# Patient Record
Sex: Female | Born: 1955 | Race: White | Hispanic: No | Smoking: Former smoker
Health system: Southern US, Community
[De-identification: ages and names within clinical notes are randomized; demographics above are authoritative.]

## PROBLEM LIST (undated history)

## (undated) DIAGNOSIS — F32A Depression, unspecified: Secondary | ICD-10-CM

## (undated) DIAGNOSIS — R109 Unspecified abdominal pain: Secondary | ICD-10-CM

## (undated) DIAGNOSIS — G43909 Migraine, unspecified, not intractable, without status migrainosus: Secondary | ICD-10-CM

## (undated) DIAGNOSIS — Z8719 Personal history of other diseases of the digestive system: Secondary | ICD-10-CM

## (undated) DIAGNOSIS — Z860100 Personal history of colon polyps, unspecified: Secondary | ICD-10-CM

## (undated) DIAGNOSIS — F329 Major depressive disorder, single episode, unspecified: Secondary | ICD-10-CM

## (undated) DIAGNOSIS — M1990 Unspecified osteoarthritis, unspecified site: Secondary | ICD-10-CM

## (undated) DIAGNOSIS — J449 Chronic obstructive pulmonary disease, unspecified: Secondary | ICD-10-CM

## (undated) DIAGNOSIS — M797 Fibromyalgia: Secondary | ICD-10-CM

## (undated) DIAGNOSIS — K3184 Gastroparesis: Secondary | ICD-10-CM

## (undated) DIAGNOSIS — K219 Gastro-esophageal reflux disease without esophagitis: Secondary | ICD-10-CM

## (undated) DIAGNOSIS — G8929 Other chronic pain: Secondary | ICD-10-CM

## (undated) DIAGNOSIS — K589 Irritable bowel syndrome without diarrhea: Secondary | ICD-10-CM

## (undated) DIAGNOSIS — F429 Obsessive-compulsive disorder, unspecified: Secondary | ICD-10-CM

## (undated) DIAGNOSIS — G629 Polyneuropathy, unspecified: Secondary | ICD-10-CM

## (undated) DIAGNOSIS — E785 Hyperlipidemia, unspecified: Secondary | ICD-10-CM

## (undated) HISTORY — DX: Polyneuropathy, unspecified: G62.9

## (undated) HISTORY — PX: OOPHORECTOMY: SHX86

## (undated) HISTORY — DX: Major depressive disorder, single episode, unspecified: F32.9

## (undated) HISTORY — DX: Fibromyalgia: M79.7

## (undated) HISTORY — PX: TOTAL ABDOMINAL HYSTERECTOMY: SHX209

## (undated) HISTORY — PX: ELBOW SURGERY: SHX618

## (undated) HISTORY — DX: Unspecified abdominal pain: R10.9

## (undated) HISTORY — DX: Depression, unspecified: F32.A

## (undated) HISTORY — PX: COLONOSCOPY: SHX174

## (undated) HISTORY — DX: Personal history of colon polyps, unspecified: Z86.0100

## (undated) HISTORY — DX: Other chronic pain: G89.29

## (undated) HISTORY — DX: Migraine, unspecified, not intractable, without status migrainosus: G43.909

## (undated) HISTORY — DX: Gastroparesis: K31.84

## (undated) HISTORY — DX: Personal history of colonic polyps: Z86.010

## (undated) HISTORY — PX: TUBAL LIGATION: SHX77

## (undated) HISTORY — PX: OTHER SURGICAL HISTORY: SHX169

## (undated) HISTORY — PX: NECK SURGERY: SHX720

## (undated) HISTORY — PX: VESICOVAGINAL FISTULA CLOSURE W/ TAH: SUR271

## (undated) HISTORY — DX: Chronic obstructive pulmonary disease, unspecified: J44.9

## (undated) HISTORY — PX: SHOULDER SURGERY: SHX246

## (undated) HISTORY — DX: Irritable bowel syndrome, unspecified: K58.9

## (undated) HISTORY — PX: APPENDECTOMY: SHX54

## (undated) HISTORY — PX: ABDOMINAL HYSTERECTOMY: SHX81

## (undated) HISTORY — PX: CERVICAL DISC SURGERY: SHX588

## (undated) HISTORY — DX: Obsessive-compulsive disorder, unspecified: F42.9

## (undated) HISTORY — DX: Hyperlipidemia, unspecified: E78.5

## (undated) HISTORY — DX: Cerebral aneurysm, nonruptured: I67.1

## (undated) HISTORY — DX: Gastro-esophageal reflux disease without esophagitis: K21.9

---

## 1997-09-21 ENCOUNTER — Ambulatory Visit (HOSPITAL_BASED_OUTPATIENT_CLINIC_OR_DEPARTMENT_OTHER): Admission: RE | Admit: 1997-09-21 | Discharge: 1997-09-21 | Payer: Self-pay | Admitting: Orthopedic Surgery

## 1997-10-19 ENCOUNTER — Ambulatory Visit (HOSPITAL_BASED_OUTPATIENT_CLINIC_OR_DEPARTMENT_OTHER): Admission: RE | Admit: 1997-10-19 | Discharge: 1997-10-19 | Payer: Self-pay | Admitting: Orthopedic Surgery

## 1998-01-17 ENCOUNTER — Emergency Department (HOSPITAL_COMMUNITY): Admission: EM | Admit: 1998-01-17 | Discharge: 1998-01-17 | Payer: Self-pay | Admitting: Internal Medicine

## 1998-03-20 ENCOUNTER — Emergency Department (HOSPITAL_COMMUNITY): Admission: EM | Admit: 1998-03-20 | Discharge: 1998-03-20 | Payer: Self-pay | Admitting: Emergency Medicine

## 1998-07-29 ENCOUNTER — Emergency Department (HOSPITAL_COMMUNITY): Admission: EM | Admit: 1998-07-29 | Discharge: 1998-07-29 | Payer: Self-pay | Admitting: Emergency Medicine

## 1998-07-30 ENCOUNTER — Encounter: Payer: Self-pay | Admitting: Emergency Medicine

## 1998-12-25 ENCOUNTER — Emergency Department (HOSPITAL_COMMUNITY): Admission: EM | Admit: 1998-12-25 | Discharge: 1998-12-25 | Payer: Self-pay | Admitting: *Deleted

## 1999-05-07 ENCOUNTER — Ambulatory Visit (HOSPITAL_COMMUNITY): Admission: RE | Admit: 1999-05-07 | Discharge: 1999-05-07 | Payer: Self-pay | Admitting: Gastroenterology

## 1999-05-07 ENCOUNTER — Encounter: Payer: Self-pay | Admitting: Gastroenterology

## 1999-05-21 ENCOUNTER — Inpatient Hospital Stay (HOSPITAL_COMMUNITY): Admission: AD | Admit: 1999-05-21 | Discharge: 1999-05-26 | Payer: Self-pay | Admitting: Addiction Psychiatry

## 1999-09-17 ENCOUNTER — Encounter: Payer: Self-pay | Admitting: Gastroenterology

## 1999-09-17 ENCOUNTER — Ambulatory Visit (HOSPITAL_COMMUNITY): Admission: RE | Admit: 1999-09-17 | Discharge: 1999-09-17 | Payer: Self-pay | Admitting: Gastroenterology

## 1999-12-22 ENCOUNTER — Emergency Department (HOSPITAL_COMMUNITY): Admission: EM | Admit: 1999-12-22 | Discharge: 1999-12-23 | Payer: Self-pay | Admitting: Emergency Medicine

## 1999-12-23 ENCOUNTER — Encounter: Payer: Self-pay | Admitting: Emergency Medicine

## 2000-01-14 ENCOUNTER — Other Ambulatory Visit: Admission: RE | Admit: 2000-01-14 | Discharge: 2000-01-14 | Payer: Self-pay | Admitting: Gastroenterology

## 2000-01-14 ENCOUNTER — Encounter (INDEPENDENT_AMBULATORY_CARE_PROVIDER_SITE_OTHER): Payer: Self-pay

## 2000-05-27 ENCOUNTER — Encounter: Payer: Self-pay | Admitting: Gastroenterology

## 2000-05-27 ENCOUNTER — Ambulatory Visit (HOSPITAL_COMMUNITY): Admission: RE | Admit: 2000-05-27 | Discharge: 2000-05-27 | Payer: Self-pay | Admitting: Gastroenterology

## 2000-08-02 ENCOUNTER — Emergency Department (HOSPITAL_COMMUNITY): Admission: EM | Admit: 2000-08-02 | Discharge: 2000-08-02 | Payer: Self-pay | Admitting: Emergency Medicine

## 2000-09-11 ENCOUNTER — Emergency Department (HOSPITAL_COMMUNITY): Admission: EM | Admit: 2000-09-11 | Discharge: 2000-09-11 | Payer: Self-pay | Admitting: Emergency Medicine

## 2000-09-18 ENCOUNTER — Emergency Department (HOSPITAL_COMMUNITY): Admission: EM | Admit: 2000-09-18 | Discharge: 2000-09-18 | Payer: Self-pay | Admitting: Emergency Medicine

## 2000-10-02 ENCOUNTER — Ambulatory Visit (HOSPITAL_COMMUNITY): Admission: RE | Admit: 2000-10-02 | Discharge: 2000-10-02 | Payer: Self-pay | Admitting: Endocrinology

## 2000-10-20 ENCOUNTER — Ambulatory Visit (HOSPITAL_BASED_OUTPATIENT_CLINIC_OR_DEPARTMENT_OTHER): Admission: RE | Admit: 2000-10-20 | Discharge: 2000-10-20 | Payer: Self-pay | Admitting: Orthopedic Surgery

## 2000-10-27 ENCOUNTER — Encounter: Payer: Self-pay | Admitting: Emergency Medicine

## 2000-10-27 ENCOUNTER — Emergency Department (HOSPITAL_COMMUNITY): Admission: EM | Admit: 2000-10-27 | Discharge: 2000-10-28 | Payer: Self-pay | Admitting: Emergency Medicine

## 2001-04-24 ENCOUNTER — Emergency Department (HOSPITAL_COMMUNITY): Admission: EM | Admit: 2001-04-24 | Discharge: 2001-04-25 | Payer: Self-pay | Admitting: Emergency Medicine

## 2001-06-25 ENCOUNTER — Encounter: Payer: Self-pay | Admitting: Emergency Medicine

## 2001-06-26 ENCOUNTER — Inpatient Hospital Stay (HOSPITAL_COMMUNITY): Admission: EM | Admit: 2001-06-26 | Discharge: 2001-06-29 | Payer: Self-pay | Admitting: Emergency Medicine

## 2001-07-07 ENCOUNTER — Encounter: Payer: Self-pay | Admitting: Internal Medicine

## 2001-07-07 ENCOUNTER — Encounter: Admission: RE | Admit: 2001-07-07 | Discharge: 2001-07-07 | Payer: Self-pay | Admitting: Internal Medicine

## 2001-07-24 ENCOUNTER — Encounter: Payer: Self-pay | Admitting: Emergency Medicine

## 2001-07-24 ENCOUNTER — Emergency Department (HOSPITAL_COMMUNITY): Admission: EM | Admit: 2001-07-24 | Discharge: 2001-07-25 | Payer: Self-pay | Admitting: Emergency Medicine

## 2001-08-12 ENCOUNTER — Emergency Department (HOSPITAL_COMMUNITY): Admission: EM | Admit: 2001-08-12 | Discharge: 2001-08-12 | Payer: Self-pay | Admitting: Emergency Medicine

## 2001-09-28 ENCOUNTER — Ambulatory Visit: Admission: RE | Admit: 2001-09-28 | Discharge: 2001-09-28 | Payer: Self-pay | Admitting: Endocrinology

## 2001-10-14 ENCOUNTER — Emergency Department (HOSPITAL_COMMUNITY): Admission: EM | Admit: 2001-10-14 | Discharge: 2001-10-14 | Payer: Self-pay | Admitting: Emergency Medicine

## 2001-11-18 ENCOUNTER — Ambulatory Visit (HOSPITAL_COMMUNITY): Admission: RE | Admit: 2001-11-18 | Discharge: 2001-11-18 | Payer: Self-pay | Admitting: Psychiatry

## 2001-12-03 ENCOUNTER — Encounter: Payer: Self-pay | Admitting: Internal Medicine

## 2001-12-03 ENCOUNTER — Encounter (INDEPENDENT_AMBULATORY_CARE_PROVIDER_SITE_OTHER): Payer: Self-pay | Admitting: Specialist

## 2001-12-03 ENCOUNTER — Inpatient Hospital Stay (HOSPITAL_COMMUNITY): Admission: EM | Admit: 2001-12-03 | Discharge: 2001-12-08 | Payer: Self-pay | Admitting: Internal Medicine

## 2001-12-08 ENCOUNTER — Encounter: Payer: Self-pay | Admitting: Internal Medicine

## 2001-12-31 ENCOUNTER — Encounter: Admission: RE | Admit: 2001-12-31 | Discharge: 2001-12-31 | Payer: Self-pay | Admitting: Internal Medicine

## 2001-12-31 ENCOUNTER — Encounter: Payer: Self-pay | Admitting: Internal Medicine

## 2002-01-03 ENCOUNTER — Encounter: Admission: RE | Admit: 2002-01-03 | Discharge: 2002-01-03 | Payer: Self-pay | Admitting: *Deleted

## 2002-01-03 ENCOUNTER — Encounter: Payer: Self-pay | Admitting: *Deleted

## 2002-02-04 ENCOUNTER — Encounter: Payer: Self-pay | Admitting: Gastroenterology

## 2002-02-04 ENCOUNTER — Ambulatory Visit (HOSPITAL_COMMUNITY): Admission: RE | Admit: 2002-02-04 | Discharge: 2002-02-04 | Payer: Self-pay | Admitting: Gastroenterology

## 2002-07-26 ENCOUNTER — Emergency Department (HOSPITAL_COMMUNITY): Admission: EM | Admit: 2002-07-26 | Discharge: 2002-07-26 | Payer: Self-pay | Admitting: Emergency Medicine

## 2003-04-28 ENCOUNTER — Encounter: Admission: RE | Admit: 2003-04-28 | Discharge: 2003-04-28 | Payer: Self-pay | Admitting: Internal Medicine

## 2003-05-17 ENCOUNTER — Emergency Department (HOSPITAL_COMMUNITY): Admission: EM | Admit: 2003-05-17 | Discharge: 2003-05-18 | Payer: Self-pay | Admitting: Emergency Medicine

## 2003-08-08 ENCOUNTER — Encounter
Admission: RE | Admit: 2003-08-08 | Discharge: 2003-11-06 | Payer: Self-pay | Admitting: Physical Medicine and Rehabilitation

## 2003-09-04 ENCOUNTER — Ambulatory Visit (HOSPITAL_COMMUNITY): Admission: RE | Admit: 2003-09-04 | Discharge: 2003-09-04 | Payer: Self-pay | Admitting: Orthopedic Surgery

## 2003-10-07 ENCOUNTER — Ambulatory Visit (HOSPITAL_COMMUNITY): Admission: RE | Admit: 2003-10-07 | Discharge: 2003-10-07 | Payer: Self-pay | Admitting: Orthopedic Surgery

## 2003-11-05 ENCOUNTER — Emergency Department (HOSPITAL_COMMUNITY): Admission: EM | Admit: 2003-11-05 | Discharge: 2003-11-05 | Payer: Self-pay | Admitting: Emergency Medicine

## 2003-11-07 ENCOUNTER — Encounter
Admission: RE | Admit: 2003-11-07 | Discharge: 2004-02-05 | Payer: Self-pay | Admitting: Physical Medicine and Rehabilitation

## 2004-03-13 ENCOUNTER — Ambulatory Visit: Payer: Self-pay | Admitting: Internal Medicine

## 2004-04-05 ENCOUNTER — Emergency Department (HOSPITAL_COMMUNITY): Admission: EM | Admit: 2004-04-05 | Discharge: 2004-04-06 | Payer: Self-pay | Admitting: Emergency Medicine

## 2004-05-29 ENCOUNTER — Ambulatory Visit: Payer: Self-pay | Admitting: Internal Medicine

## 2004-06-02 ENCOUNTER — Emergency Department (HOSPITAL_COMMUNITY): Admission: EM | Admit: 2004-06-02 | Discharge: 2004-06-02 | Payer: Self-pay | Admitting: Emergency Medicine

## 2004-06-12 ENCOUNTER — Emergency Department (HOSPITAL_COMMUNITY): Admission: EM | Admit: 2004-06-12 | Discharge: 2004-06-12 | Payer: Self-pay | Admitting: Emergency Medicine

## 2004-06-15 ENCOUNTER — Ambulatory Visit: Payer: Self-pay | Admitting: Internal Medicine

## 2004-06-28 ENCOUNTER — Ambulatory Visit: Payer: Self-pay | Admitting: Internal Medicine

## 2004-07-24 ENCOUNTER — Ambulatory Visit: Payer: Self-pay | Admitting: Internal Medicine

## 2004-07-28 ENCOUNTER — Emergency Department (HOSPITAL_COMMUNITY): Admission: EM | Admit: 2004-07-28 | Discharge: 2004-07-28 | Payer: Self-pay | Admitting: Emergency Medicine

## 2004-07-31 ENCOUNTER — Ambulatory Visit: Payer: Self-pay | Admitting: Internal Medicine

## 2004-08-07 ENCOUNTER — Ambulatory Visit: Payer: Self-pay | Admitting: Gastroenterology

## 2004-09-14 ENCOUNTER — Ambulatory Visit: Payer: Self-pay | Admitting: Internal Medicine

## 2004-09-26 ENCOUNTER — Ambulatory Visit: Payer: Self-pay | Admitting: Internal Medicine

## 2004-10-18 ENCOUNTER — Ambulatory Visit: Payer: Self-pay | Admitting: Internal Medicine

## 2004-10-18 ENCOUNTER — Ambulatory Visit (HOSPITAL_COMMUNITY): Admission: RE | Admit: 2004-10-18 | Discharge: 2004-10-18 | Payer: Self-pay | Admitting: Internal Medicine

## 2004-11-02 ENCOUNTER — Ambulatory Visit: Payer: Self-pay | Admitting: Internal Medicine

## 2004-11-05 ENCOUNTER — Encounter: Admission: RE | Admit: 2004-11-05 | Discharge: 2004-11-14 | Payer: Self-pay | Admitting: Neurosurgery

## 2004-11-08 ENCOUNTER — Ambulatory Visit (HOSPITAL_COMMUNITY): Admission: RE | Admit: 2004-11-08 | Discharge: 2004-11-08 | Payer: Self-pay | Admitting: Neurosurgery

## 2004-11-26 ENCOUNTER — Encounter: Admission: RE | Admit: 2004-11-26 | Discharge: 2005-02-24 | Payer: Self-pay | Admitting: Anesthesiology

## 2004-11-30 ENCOUNTER — Inpatient Hospital Stay (HOSPITAL_COMMUNITY): Admission: RE | Admit: 2004-11-30 | Discharge: 2004-12-02 | Payer: Self-pay | Admitting: Neurosurgery

## 2004-12-22 ENCOUNTER — Emergency Department (HOSPITAL_COMMUNITY): Admission: EM | Admit: 2004-12-22 | Discharge: 2004-12-22 | Payer: Self-pay | Admitting: Emergency Medicine

## 2005-01-02 ENCOUNTER — Ambulatory Visit: Payer: Self-pay | Admitting: Internal Medicine

## 2005-01-08 ENCOUNTER — Ambulatory Visit: Payer: Self-pay | Admitting: Gastroenterology

## 2005-03-04 ENCOUNTER — Ambulatory Visit (HOSPITAL_COMMUNITY): Admission: RE | Admit: 2005-03-04 | Discharge: 2005-03-04 | Payer: Self-pay | Admitting: Neurosurgery

## 2005-04-02 ENCOUNTER — Ambulatory Visit: Payer: Self-pay | Admitting: Internal Medicine

## 2005-04-04 ENCOUNTER — Ambulatory Visit: Payer: Self-pay | Admitting: Gastroenterology

## 2005-04-20 ENCOUNTER — Ambulatory Visit: Payer: Self-pay | Admitting: Family Medicine

## 2005-04-20 ENCOUNTER — Ambulatory Visit (HOSPITAL_COMMUNITY): Admission: RE | Admit: 2005-04-20 | Discharge: 2005-04-20 | Payer: Self-pay | Admitting: Family Medicine

## 2005-05-01 ENCOUNTER — Ambulatory Visit: Payer: Self-pay | Admitting: Internal Medicine

## 2005-05-03 ENCOUNTER — Encounter: Payer: Self-pay | Admitting: Neurosurgery

## 2005-05-16 ENCOUNTER — Ambulatory Visit: Payer: Self-pay | Admitting: Gastroenterology

## 2005-05-20 ENCOUNTER — Ambulatory Visit: Payer: Self-pay | Admitting: Cardiology

## 2005-05-29 ENCOUNTER — Ambulatory Visit: Payer: Self-pay | Admitting: Internal Medicine

## 2005-06-19 ENCOUNTER — Ambulatory Visit (HOSPITAL_COMMUNITY): Admission: AD | Admit: 2005-06-19 | Discharge: 2005-06-20 | Payer: Self-pay | Admitting: Interventional Radiology

## 2005-06-27 ENCOUNTER — Ambulatory Visit: Payer: Self-pay | Admitting: Gastroenterology

## 2005-07-02 ENCOUNTER — Ambulatory Visit (HOSPITAL_COMMUNITY): Admission: RE | Admit: 2005-07-02 | Discharge: 2005-07-02 | Payer: Self-pay | Admitting: Gastroenterology

## 2005-07-03 ENCOUNTER — Encounter: Payer: Self-pay | Admitting: Interventional Radiology

## 2005-07-25 ENCOUNTER — Ambulatory Visit: Payer: Self-pay | Admitting: Gastroenterology

## 2005-07-29 ENCOUNTER — Encounter (INDEPENDENT_AMBULATORY_CARE_PROVIDER_SITE_OTHER): Payer: Self-pay | Admitting: Specialist

## 2005-07-29 ENCOUNTER — Ambulatory Visit: Payer: Self-pay | Admitting: Gastroenterology

## 2005-08-27 ENCOUNTER — Ambulatory Visit: Payer: Self-pay | Admitting: Gastroenterology

## 2005-09-05 ENCOUNTER — Ambulatory Visit (HOSPITAL_COMMUNITY): Admission: RE | Admit: 2005-09-05 | Discharge: 2005-09-05 | Payer: Self-pay | Admitting: Interventional Radiology

## 2005-09-07 ENCOUNTER — Emergency Department (HOSPITAL_COMMUNITY): Admission: EM | Admit: 2005-09-07 | Discharge: 2005-09-07 | Payer: Self-pay | Admitting: Emergency Medicine

## 2005-09-09 ENCOUNTER — Ambulatory Visit: Payer: Self-pay | Admitting: Internal Medicine

## 2005-09-21 ENCOUNTER — Ambulatory Visit (HOSPITAL_COMMUNITY): Admission: RE | Admit: 2005-09-21 | Discharge: 2005-09-21 | Payer: Self-pay | Admitting: Otolaryngology

## 2005-09-21 ENCOUNTER — Ambulatory Visit: Payer: Self-pay | Admitting: Family Medicine

## 2005-09-26 ENCOUNTER — Ambulatory Visit: Payer: Self-pay | Admitting: Internal Medicine

## 2005-10-03 ENCOUNTER — Ambulatory Visit (HOSPITAL_COMMUNITY): Admission: RE | Admit: 2005-10-03 | Discharge: 2005-10-04 | Payer: Self-pay | Admitting: Interventional Radiology

## 2005-10-18 ENCOUNTER — Encounter: Payer: Self-pay | Admitting: Interventional Radiology

## 2005-11-27 ENCOUNTER — Ambulatory Visit: Payer: Self-pay | Admitting: Gastroenterology

## 2006-01-01 ENCOUNTER — Ambulatory Visit (HOSPITAL_COMMUNITY): Admission: RE | Admit: 2006-01-01 | Discharge: 2006-01-01 | Payer: Self-pay | Admitting: Interventional Radiology

## 2006-01-28 ENCOUNTER — Ambulatory Visit: Payer: Self-pay | Admitting: Internal Medicine

## 2006-04-03 ENCOUNTER — Ambulatory Visit (HOSPITAL_COMMUNITY): Admission: RE | Admit: 2006-04-03 | Discharge: 2006-04-03 | Payer: Self-pay | Admitting: Interventional Radiology

## 2006-04-25 ENCOUNTER — Ambulatory Visit: Payer: Self-pay | Admitting: Internal Medicine

## 2006-05-30 ENCOUNTER — Ambulatory Visit: Payer: Self-pay | Admitting: Internal Medicine

## 2006-08-12 ENCOUNTER — Ambulatory Visit: Payer: Self-pay | Admitting: Internal Medicine

## 2006-08-12 LAB — CONVERTED CEMR LAB
ALT: 13 units/L (ref 0–40)
AST: 15 units/L (ref 0–37)
Albumin: 3.7 g/dL (ref 3.5–5.2)
Alkaline Phosphatase: 105 units/L (ref 39–117)
BUN: 7 mg/dL (ref 6–23)
Basophils Absolute: 0 10*3/uL (ref 0.0–0.1)
Basophils Relative: 0.5 % (ref 0.0–1.0)
Calcium: 9.4 mg/dL (ref 8.4–10.5)
Chloride: 114 meq/L — ABNORMAL HIGH (ref 96–112)
Cholesterol: 103 mg/dL (ref 0–200)
Crystals: NEGATIVE
GFR calc non Af Amer: 63 mL/min
HCT: 42.5 % (ref 36.0–46.0)
LDL Cholesterol: 38 mg/dL (ref 0–99)
MCHC: 34.1 g/dL (ref 30.0–36.0)
Monocytes Relative: 5.8 % (ref 3.0–11.0)
Neutrophils Relative %: 67.7 % (ref 43.0–77.0)
RBC: 4.52 M/uL (ref 3.87–5.11)
RDW: 14 % (ref 11.5–14.6)
Sodium: 144 meq/L (ref 135–145)
Specific Gravity, Urine: 1.025 (ref 1.000–1.03)
TSH: 0.94 microintl units/mL (ref 0.35–5.50)
Total CHOL/HDL Ratio: 2.5
VLDL: 24 mg/dL (ref 0–40)
pH: 6 (ref 5.0–8.0)

## 2006-08-17 ENCOUNTER — Encounter: Admission: RE | Admit: 2006-08-17 | Discharge: 2006-08-17 | Payer: Self-pay | Admitting: Internal Medicine

## 2006-08-28 ENCOUNTER — Ambulatory Visit: Payer: Self-pay | Admitting: Gastroenterology

## 2006-09-03 ENCOUNTER — Ambulatory Visit: Payer: Self-pay | Admitting: Gastroenterology

## 2006-09-15 ENCOUNTER — Ambulatory Visit: Payer: Self-pay | Admitting: Gastroenterology

## 2006-09-15 ENCOUNTER — Encounter (INDEPENDENT_AMBULATORY_CARE_PROVIDER_SITE_OTHER): Payer: Self-pay | Admitting: *Deleted

## 2006-09-15 LAB — HM COLONOSCOPY

## 2006-10-20 ENCOUNTER — Ambulatory Visit (HOSPITAL_COMMUNITY): Admission: RE | Admit: 2006-10-20 | Discharge: 2006-10-20 | Payer: Self-pay | Admitting: Interventional Radiology

## 2006-11-18 DIAGNOSIS — F32A Depression, unspecified: Secondary | ICD-10-CM | POA: Insufficient documentation

## 2006-11-18 DIAGNOSIS — F329 Major depressive disorder, single episode, unspecified: Secondary | ICD-10-CM

## 2007-01-15 ENCOUNTER — Ambulatory Visit (HOSPITAL_COMMUNITY): Admission: RE | Admit: 2007-01-15 | Discharge: 2007-01-15 | Payer: Self-pay | Admitting: Interventional Radiology

## 2007-02-09 ENCOUNTER — Ambulatory Visit: Payer: Self-pay | Admitting: Internal Medicine

## 2007-02-09 LAB — CONVERTED CEMR LAB
Basophils Relative: 0.7 % (ref 0.0–1.0)
Bilirubin Urine: NEGATIVE
CO2: 26 meq/L (ref 19–32)
Creatinine, Ser: 1.1 mg/dL (ref 0.4–1.2)
Crystals: NEGATIVE
Glucose, Bld: 108 mg/dL — ABNORMAL HIGH (ref 70–99)
Hemoglobin: 15.1 g/dL — ABNORMAL HIGH (ref 12.0–15.0)
Lymphocytes Relative: 20.5 % (ref 12.0–46.0)
Monocytes Absolute: 0.4 10*3/uL (ref 0.2–0.7)
Monocytes Relative: 4.3 % (ref 3.0–11.0)
Mucus, UA: NEGATIVE
Neutro Abs: 7.2 10*3/uL (ref 1.4–7.7)
Nitrite: NEGATIVE
Potassium: 3.8 meq/L (ref 3.5–5.1)
Sodium: 146 meq/L — ABNORMAL HIGH (ref 135–145)
TSH: 0.9 microintl units/mL (ref 0.35–5.50)
Total Bilirubin: 0.6 mg/dL (ref 0.3–1.2)
Total Protein: 7 g/dL (ref 6.0–8.3)
Urine Glucose: NEGATIVE mg/dL
Urobilinogen, UA: 0.2 (ref 0.0–1.0)

## 2007-05-21 ENCOUNTER — Ambulatory Visit: Payer: Self-pay | Admitting: Internal Medicine

## 2007-07-20 ENCOUNTER — Ambulatory Visit: Payer: Self-pay | Admitting: Internal Medicine

## 2007-07-20 DIAGNOSIS — F411 Generalized anxiety disorder: Secondary | ICD-10-CM | POA: Insufficient documentation

## 2007-07-20 DIAGNOSIS — G609 Hereditary and idiopathic neuropathy, unspecified: Secondary | ICD-10-CM | POA: Insufficient documentation

## 2007-07-20 DIAGNOSIS — K589 Irritable bowel syndrome without diarrhea: Secondary | ICD-10-CM | POA: Insufficient documentation

## 2007-07-20 DIAGNOSIS — J309 Allergic rhinitis, unspecified: Secondary | ICD-10-CM | POA: Insufficient documentation

## 2007-07-20 DIAGNOSIS — Z860100 Personal history of colon polyps, unspecified: Secondary | ICD-10-CM | POA: Insufficient documentation

## 2007-07-20 DIAGNOSIS — K219 Gastro-esophageal reflux disease without esophagitis: Secondary | ICD-10-CM | POA: Insufficient documentation

## 2007-07-20 DIAGNOSIS — R1084 Generalized abdominal pain: Secondary | ICD-10-CM | POA: Insufficient documentation

## 2007-07-20 DIAGNOSIS — E785 Hyperlipidemia, unspecified: Secondary | ICD-10-CM | POA: Insufficient documentation

## 2007-07-20 LAB — CONVERTED CEMR LAB
ALT: 10 units/L (ref 0–35)
Albumin: 3.6 g/dL (ref 3.5–5.2)
Alkaline Phosphatase: 63 units/L (ref 39–117)
BUN: 4 mg/dL — ABNORMAL LOW (ref 6–23)
Basophils Absolute: 0 10*3/uL (ref 0.0–0.1)
Basophils Relative: 0.3 % (ref 0.0–1.0)
Bilirubin Urine: NEGATIVE
CO2: 29 meq/L (ref 19–32)
Calcium: 8.6 mg/dL (ref 8.4–10.5)
Crystals: NEGATIVE
GFR calc Af Amer: 61 mL/min
GFR calc non Af Amer: 51 mL/min
H Pylori IgG: NEGATIVE
Lipase: 13 units/L (ref 11.0–59.0)
Lymphocytes Relative: 16.5 % (ref 12.0–46.0)
MCHC: 34.1 g/dL (ref 30.0–36.0)
Monocytes Relative: 8.2 % (ref 3.0–11.0)
Neutro Abs: 7.8 10*3/uL — ABNORMAL HIGH (ref 1.4–7.7)
Platelets: 164 10*3/uL (ref 150–400)
Potassium: 3.5 meq/L (ref 3.5–5.1)
Specific Gravity, Urine: 1.025 (ref 1.000–1.03)
Urine Glucose: NEGATIVE mg/dL
pH: 6 (ref 5.0–8.0)

## 2007-09-09 ENCOUNTER — Emergency Department (HOSPITAL_COMMUNITY): Admission: EM | Admit: 2007-09-09 | Discharge: 2007-09-09 | Payer: Self-pay | Admitting: Emergency Medicine

## 2007-09-21 ENCOUNTER — Telehealth (INDEPENDENT_AMBULATORY_CARE_PROVIDER_SITE_OTHER): Payer: Self-pay | Admitting: *Deleted

## 2007-10-28 ENCOUNTER — Encounter: Payer: Self-pay | Admitting: Internal Medicine

## 2007-11-26 ENCOUNTER — Telehealth: Payer: Self-pay | Admitting: Internal Medicine

## 2007-12-14 ENCOUNTER — Ambulatory Visit (HOSPITAL_COMMUNITY): Admission: RE | Admit: 2007-12-14 | Discharge: 2007-12-14 | Payer: Self-pay | Admitting: Interventional Radiology

## 2007-12-29 ENCOUNTER — Telehealth: Payer: Self-pay | Admitting: Internal Medicine

## 2007-12-30 ENCOUNTER — Ambulatory Visit: Payer: Self-pay | Admitting: Internal Medicine

## 2007-12-30 DIAGNOSIS — M549 Dorsalgia, unspecified: Secondary | ICD-10-CM | POA: Insufficient documentation

## 2007-12-30 DIAGNOSIS — M25559 Pain in unspecified hip: Secondary | ICD-10-CM | POA: Insufficient documentation

## 2007-12-31 LAB — CONVERTED CEMR LAB
Cholesterol: 203 mg/dL (ref 0–200)
HDL: 40.6 mg/dL (ref >39.0)
Triglycerides: 107 mg/dL (ref 0–149)
VLDL: 21 mg/dL (ref 0–40)

## 2008-01-04 ENCOUNTER — Telehealth (INDEPENDENT_AMBULATORY_CARE_PROVIDER_SITE_OTHER): Payer: Self-pay | Admitting: *Deleted

## 2008-01-30 ENCOUNTER — Encounter: Admission: RE | Admit: 2008-01-30 | Discharge: 2008-01-30 | Payer: Self-pay | Admitting: Orthopedic Surgery

## 2008-02-02 ENCOUNTER — Ambulatory Visit: Payer: Self-pay | Admitting: Internal Medicine

## 2008-02-02 LAB — CONVERTED CEMR LAB
Alkaline Phosphatase: 48 units/L (ref 39–117)
Bilirubin, Direct: 0.1 mg/dL (ref 0.0–0.3)
LDL Cholesterol: 40 mg/dL (ref 0–99)
Total CHOL/HDL Ratio: 2.5
Total Protein: 6 g/dL (ref 6.0–8.3)
VLDL: 15 mg/dL (ref 0–40)

## 2008-04-15 ENCOUNTER — Ambulatory Visit: Payer: Self-pay | Admitting: Internal Medicine

## 2008-05-11 ENCOUNTER — Ambulatory Visit: Payer: Self-pay | Admitting: Internal Medicine

## 2008-05-11 DIAGNOSIS — J019 Acute sinusitis, unspecified: Secondary | ICD-10-CM | POA: Insufficient documentation

## 2008-05-11 DIAGNOSIS — H109 Unspecified conjunctivitis: Secondary | ICD-10-CM | POA: Insufficient documentation

## 2008-06-29 ENCOUNTER — Ambulatory Visit: Payer: Self-pay | Admitting: Internal Medicine

## 2008-06-29 LAB — CONVERTED CEMR LAB
Alkaline Phosphatase: 61 units/L (ref 39–117)
Basophils Absolute: 0.1 10*3/uL (ref 0.0–0.1)
Bilirubin Urine: NEGATIVE
Bilirubin, Direct: 0.1 mg/dL (ref 0.0–0.3)
Calcium: 9.2 mg/dL (ref 8.4–10.5)
Eosinophils Absolute: 0.2 10*3/uL (ref 0.0–0.7)
GFR calc Af Amer: 75 mL/min
GFR calc non Af Amer: 62 mL/min
HCT: 41.2 % (ref 36.0–46.0)
HDL: 43.3 mg/dL (ref >39.0)
Hemoglobin: 14.1 g/dL (ref 12.0–15.0)
Ketones, ur: NEGATIVE mg/dL
Leukocytes, UA: NEGATIVE
MCHC: 34.2 g/dL (ref 30.0–36.0)
Monocytes Absolute: 0.5 10*3/uL (ref 0.1–1.0)
Neutro Abs: 6.1 10*3/uL (ref 1.4–7.7)
Nitrite: NEGATIVE
Platelets: 197 10*3/uL (ref 150–400)
Potassium: 3.7 meq/L (ref 3.5–5.1)
RDW: 12.7 % (ref 11.5–14.6)
Sodium: 146 meq/L — ABNORMAL HIGH (ref 135–145)
Total Bilirubin: 0.7 mg/dL (ref 0.3–1.2)
Total CHOL/HDL Ratio: 2.6
Total Protein, Urine: NEGATIVE mg/dL
Total Protein: 6.3 g/dL (ref 6.0–8.3)
Triglycerides: 87 mg/dL (ref 0–149)
VLDL: 17 mg/dL (ref 0–40)
pH: 5.5 (ref 5.0–8.0)

## 2008-07-04 ENCOUNTER — Telehealth (INDEPENDENT_AMBULATORY_CARE_PROVIDER_SITE_OTHER): Payer: Self-pay | Admitting: *Deleted

## 2008-07-06 ENCOUNTER — Telehealth (INDEPENDENT_AMBULATORY_CARE_PROVIDER_SITE_OTHER): Payer: Self-pay | Admitting: *Deleted

## 2008-07-08 ENCOUNTER — Telehealth: Payer: Self-pay | Admitting: Internal Medicine

## 2008-07-11 ENCOUNTER — Ambulatory Visit: Payer: Self-pay | Admitting: Internal Medicine

## 2008-07-11 DIAGNOSIS — R197 Diarrhea, unspecified: Secondary | ICD-10-CM | POA: Insufficient documentation

## 2008-07-11 DIAGNOSIS — R634 Abnormal weight loss: Secondary | ICD-10-CM | POA: Insufficient documentation

## 2008-07-11 DIAGNOSIS — R222 Localized swelling, mass and lump, trunk: Secondary | ICD-10-CM | POA: Insufficient documentation

## 2008-07-11 DIAGNOSIS — R109 Unspecified abdominal pain: Secondary | ICD-10-CM | POA: Insufficient documentation

## 2008-07-15 ENCOUNTER — Ambulatory Visit: Payer: Self-pay | Admitting: Cardiovascular Disease

## 2008-07-20 ENCOUNTER — Ambulatory Visit: Payer: Self-pay | Admitting: Internal Medicine

## 2008-07-20 DIAGNOSIS — IMO0001 Reserved for inherently not codable concepts without codable children: Secondary | ICD-10-CM | POA: Insufficient documentation

## 2008-07-27 ENCOUNTER — Telehealth: Payer: Self-pay | Admitting: Gastroenterology

## 2008-07-27 ENCOUNTER — Telehealth (INDEPENDENT_AMBULATORY_CARE_PROVIDER_SITE_OTHER): Payer: Self-pay | Admitting: *Deleted

## 2008-07-28 ENCOUNTER — Ambulatory Visit: Payer: Self-pay | Admitting: Internal Medicine

## 2008-08-16 ENCOUNTER — Ambulatory Visit: Payer: Self-pay | Admitting: Gastroenterology

## 2008-08-16 DIAGNOSIS — F3131 Bipolar disorder, current episode depressed, mild: Secondary | ICD-10-CM | POA: Insufficient documentation

## 2008-08-17 LAB — CONVERTED CEMR LAB
Ferritin: 25.5 ng/mL (ref 10.0–291.0)
Iron: 94 ug/dL (ref 42–145)
Saturation Ratios: 30.3 % (ref 20.0–50.0)
Sed Rate: 9 mm/hr (ref 0–22)
Transferrin: 221.6 mg/dL (ref 212.0–360.0)

## 2008-08-18 ENCOUNTER — Ambulatory Visit: Payer: Self-pay | Admitting: Internal Medicine

## 2008-08-18 ENCOUNTER — Ambulatory Visit: Payer: Self-pay | Admitting: Gastroenterology

## 2008-08-18 DIAGNOSIS — E538 Deficiency of other specified B group vitamins: Secondary | ICD-10-CM | POA: Insufficient documentation

## 2008-08-19 ENCOUNTER — Telehealth (INDEPENDENT_AMBULATORY_CARE_PROVIDER_SITE_OTHER): Payer: Self-pay | Admitting: *Deleted

## 2008-08-25 ENCOUNTER — Ambulatory Visit: Payer: Self-pay | Admitting: Gastroenterology

## 2008-09-01 ENCOUNTER — Ambulatory Visit: Payer: Self-pay | Admitting: Gastroenterology

## 2008-09-01 DIAGNOSIS — K902 Blind loop syndrome, not elsewhere classified: Secondary | ICD-10-CM | POA: Insufficient documentation

## 2008-09-08 ENCOUNTER — Ambulatory Visit (HOSPITAL_COMMUNITY): Admission: RE | Admit: 2008-09-08 | Discharge: 2008-09-08 | Payer: Self-pay | Admitting: Gastroenterology

## 2008-09-19 ENCOUNTER — Ambulatory Visit: Payer: Self-pay | Admitting: Gastroenterology

## 2008-09-19 ENCOUNTER — Telehealth: Payer: Self-pay | Admitting: Gastroenterology

## 2008-09-19 ENCOUNTER — Encounter: Payer: Self-pay | Admitting: Internal Medicine

## 2008-09-19 DIAGNOSIS — K2970 Gastritis, unspecified, without bleeding: Secondary | ICD-10-CM | POA: Insufficient documentation

## 2008-09-19 DIAGNOSIS — K2990 Gastroduodenitis, unspecified, without bleeding: Secondary | ICD-10-CM

## 2008-09-19 DIAGNOSIS — K3184 Gastroparesis: Secondary | ICD-10-CM | POA: Insufficient documentation

## 2008-09-21 ENCOUNTER — Ambulatory Visit: Payer: Self-pay | Admitting: Internal Medicine

## 2008-09-21 DIAGNOSIS — R0989 Other specified symptoms and signs involving the circulatory and respiratory systems: Secondary | ICD-10-CM | POA: Insufficient documentation

## 2008-09-21 DIAGNOSIS — J449 Chronic obstructive pulmonary disease, unspecified: Secondary | ICD-10-CM | POA: Insufficient documentation

## 2008-09-21 DIAGNOSIS — R0609 Other forms of dyspnea: Secondary | ICD-10-CM | POA: Insufficient documentation

## 2008-09-21 DIAGNOSIS — J984 Other disorders of lung: Secondary | ICD-10-CM | POA: Insufficient documentation

## 2008-09-26 ENCOUNTER — Encounter: Payer: Self-pay | Admitting: Gastroenterology

## 2008-09-26 ENCOUNTER — Encounter: Payer: Self-pay | Admitting: Internal Medicine

## 2008-10-03 ENCOUNTER — Encounter: Payer: Self-pay | Admitting: Internal Medicine

## 2008-10-03 ENCOUNTER — Ambulatory Visit: Payer: Self-pay

## 2008-10-04 ENCOUNTER — Encounter: Payer: Self-pay | Admitting: Internal Medicine

## 2008-10-04 ENCOUNTER — Ambulatory Visit: Payer: Self-pay | Admitting: Gastroenterology

## 2008-10-04 ENCOUNTER — Ambulatory Visit: Payer: Self-pay | Admitting: Internal Medicine

## 2008-10-07 ENCOUNTER — Telehealth: Payer: Self-pay | Admitting: Internal Medicine

## 2008-10-10 ENCOUNTER — Encounter: Payer: Self-pay | Admitting: Internal Medicine

## 2008-10-12 ENCOUNTER — Ambulatory Visit: Payer: Self-pay | Admitting: Internal Medicine

## 2008-10-12 LAB — CONVERTED CEMR LAB: BUN: 14 mg/dL (ref 6–23)

## 2008-10-18 ENCOUNTER — Ambulatory Visit: Payer: Self-pay | Admitting: Cardiology

## 2008-10-20 ENCOUNTER — Encounter: Payer: Self-pay | Admitting: Internal Medicine

## 2008-10-21 ENCOUNTER — Ambulatory Visit: Payer: Self-pay | Admitting: Internal Medicine

## 2008-10-21 ENCOUNTER — Inpatient Hospital Stay (HOSPITAL_COMMUNITY): Admission: EM | Admit: 2008-10-21 | Discharge: 2008-10-22 | Payer: Self-pay | Admitting: Emergency Medicine

## 2008-11-02 ENCOUNTER — Encounter: Payer: Self-pay | Admitting: Interventional Radiology

## 2008-11-03 ENCOUNTER — Ambulatory Visit: Payer: Self-pay | Admitting: Gastroenterology

## 2008-11-16 ENCOUNTER — Encounter: Payer: Self-pay | Admitting: Gastroenterology

## 2008-12-05 ENCOUNTER — Telehealth: Payer: Self-pay | Admitting: Gastroenterology

## 2008-12-06 ENCOUNTER — Telehealth: Payer: Self-pay | Admitting: Gastroenterology

## 2008-12-08 ENCOUNTER — Ambulatory Visit: Payer: Self-pay | Admitting: Gastroenterology

## 2009-01-02 ENCOUNTER — Telehealth: Payer: Self-pay | Admitting: Gastroenterology

## 2009-01-12 ENCOUNTER — Ambulatory Visit: Payer: Self-pay | Admitting: Gastroenterology

## 2009-01-17 ENCOUNTER — Telehealth (INDEPENDENT_AMBULATORY_CARE_PROVIDER_SITE_OTHER): Payer: Self-pay | Admitting: *Deleted

## 2009-01-20 ENCOUNTER — Encounter: Payer: Self-pay | Admitting: Internal Medicine

## 2009-01-30 ENCOUNTER — Ambulatory Visit: Payer: Self-pay | Admitting: Internal Medicine

## 2009-01-31 LAB — CONVERTED CEMR LAB
Specific Gravity, Urine: >=1.03 (ref 1.000–1.030)
Urine Glucose: NEGATIVE mg/dL
Urobilinogen, UA: 0.2 (ref 0.0–1.0)
pH: 5.5 (ref 5.0–8.0)

## 2009-02-09 ENCOUNTER — Ambulatory Visit: Payer: Self-pay | Admitting: Gastroenterology

## 2009-02-09 ENCOUNTER — Telehealth: Payer: Self-pay | Admitting: Internal Medicine

## 2009-02-14 ENCOUNTER — Ambulatory Visit: Payer: Self-pay | Admitting: Internal Medicine

## 2009-02-14 LAB — CONVERTED CEMR LAB
ALT: 15 units/L (ref 0–35)
BUN: 17 mg/dL (ref 6–23)
CO2: 29 meq/L (ref 19–32)
Chloride: 111 meq/L (ref 96–112)
Cholesterol: 122 mg/dL (ref 0–200)
Creatinine, Ser: 0.9 mg/dL (ref 0.4–1.2)
Eosinophils Absolute: 0.2 10*3/uL (ref 0.0–0.7)
Eosinophils Relative: 2.3 % (ref 0.0–5.0)
Glucose, Bld: 78 mg/dL (ref 70–99)
MCHC: 34.3 g/dL (ref 30.0–36.0)
MCV: 98.4 fL (ref 78.0–100.0)
Monocytes Absolute: 0.6 10*3/uL (ref 0.1–1.0)
Neutrophils Relative %: 72.8 % (ref 43.0–77.0)
Nitrite: NEGATIVE
Platelets: 228 10*3/uL (ref 150.0–400.0)
Total Protein: 6.2 g/dL (ref 6.0–8.3)
Triglycerides: 44 mg/dL (ref 0.0–149.0)
Urine Glucose: NEGATIVE mg/dL
Urobilinogen, UA: 0.2 (ref 0.0–1.0)
WBC: 9.7 10*3/uL (ref 4.5–10.5)

## 2009-02-16 ENCOUNTER — Ambulatory Visit: Payer: Self-pay | Admitting: Internal Medicine

## 2009-02-16 DIAGNOSIS — J069 Acute upper respiratory infection, unspecified: Secondary | ICD-10-CM | POA: Insufficient documentation

## 2009-02-16 DIAGNOSIS — R609 Edema, unspecified: Secondary | ICD-10-CM | POA: Insufficient documentation

## 2009-02-16 DIAGNOSIS — M25569 Pain in unspecified knee: Secondary | ICD-10-CM | POA: Insufficient documentation

## 2009-02-16 DIAGNOSIS — R42 Dizziness and giddiness: Secondary | ICD-10-CM | POA: Insufficient documentation

## 2009-02-21 ENCOUNTER — Telehealth: Payer: Self-pay | Admitting: Internal Medicine

## 2009-03-13 ENCOUNTER — Ambulatory Visit (HOSPITAL_COMMUNITY): Admission: RE | Admit: 2009-03-13 | Discharge: 2009-03-13 | Payer: Self-pay | Admitting: Interventional Radiology

## 2009-03-16 ENCOUNTER — Ambulatory Visit: Payer: Self-pay | Admitting: Internal Medicine

## 2009-03-16 ENCOUNTER — Ambulatory Visit: Payer: Self-pay | Admitting: Gastroenterology

## 2009-03-17 ENCOUNTER — Encounter: Admission: RE | Admit: 2009-03-17 | Discharge: 2009-03-17 | Payer: Self-pay | Admitting: Orthopedic Surgery

## 2009-04-04 ENCOUNTER — Ambulatory Visit (HOSPITAL_COMMUNITY): Admission: RE | Admit: 2009-04-04 | Discharge: 2009-04-04 | Payer: Self-pay | Admitting: Interventional Radiology

## 2009-04-05 ENCOUNTER — Ambulatory Visit: Payer: Self-pay | Admitting: Internal Medicine

## 2009-04-05 DIAGNOSIS — D72829 Elevated white blood cell count, unspecified: Secondary | ICD-10-CM | POA: Insufficient documentation

## 2009-04-05 LAB — CONVERTED CEMR LAB
AST: 19 units/L (ref 0–37)
Albumin: 4.3 g/dL (ref 3.5–5.2)
Alkaline Phosphatase: 71 units/L (ref 39–117)
Basophils Absolute: 0 10*3/uL (ref 0.0–0.1)
Bilirubin, Direct: 0.1 mg/dL (ref 0.0–0.3)
Calcium: 9.5 mg/dL (ref 8.4–10.5)
GFR calc non Af Amer: 62.12 mL/min (ref >60)
Glucose, Bld: 97 mg/dL (ref 70–99)
Hemoglobin: 15.1 g/dL — ABNORMAL HIGH (ref 12.0–15.0)
Ketones, ur: NEGATIVE mg/dL
Lymphocytes Relative: 22 % (ref 12.0–46.0)
Monocytes Relative: 5.6 % (ref 3.0–12.0)
Neutro Abs: 7.1 10*3/uL (ref 1.4–7.7)
Neutrophils Relative %: 70 % (ref 43.0–77.0)
RDW: 13.1 % (ref 11.5–14.6)
Sodium: 144 meq/L (ref 135–145)
Specific Gravity, Urine: <=1.005 (ref 1.000–1.030)
Total Bilirubin: 0.5 mg/dL (ref 0.3–1.2)
Total Protein, Urine: NEGATIVE mg/dL
Urine Glucose: NEGATIVE mg/dL
pH: 7 (ref 5.0–8.0)

## 2009-04-11 ENCOUNTER — Telehealth: Payer: Self-pay | Admitting: Internal Medicine

## 2009-04-13 ENCOUNTER — Ambulatory Visit: Payer: Self-pay | Admitting: Gastroenterology

## 2009-04-24 ENCOUNTER — Ambulatory Visit (HOSPITAL_COMMUNITY): Admission: RE | Admit: 2009-04-24 | Discharge: 2009-04-24 | Payer: Self-pay | Admitting: Interventional Radiology

## 2009-05-02 ENCOUNTER — Ambulatory Visit: Payer: Self-pay | Admitting: Internal Medicine

## 2009-05-02 DIAGNOSIS — G43719 Chronic migraine without aura, intractable, without status migrainosus: Secondary | ICD-10-CM | POA: Insufficient documentation

## 2009-05-03 ENCOUNTER — Telehealth: Payer: Self-pay | Admitting: Gastroenterology

## 2009-05-05 ENCOUNTER — Telehealth: Payer: Self-pay | Admitting: Internal Medicine

## 2009-05-09 ENCOUNTER — Telehealth (INDEPENDENT_AMBULATORY_CARE_PROVIDER_SITE_OTHER): Payer: Self-pay | Admitting: *Deleted

## 2009-05-09 ENCOUNTER — Telehealth: Payer: Self-pay | Admitting: Internal Medicine

## 2009-05-12 ENCOUNTER — Telehealth: Payer: Self-pay | Admitting: Gastroenterology

## 2009-05-18 ENCOUNTER — Encounter: Payer: Self-pay | Admitting: Internal Medicine

## 2009-05-24 ENCOUNTER — Ambulatory Visit: Payer: Self-pay | Admitting: Gastroenterology

## 2009-08-21 ENCOUNTER — Telehealth: Payer: Self-pay | Admitting: Gastroenterology

## 2009-08-22 ENCOUNTER — Ambulatory Visit: Payer: Self-pay | Admitting: Gastroenterology

## 2009-08-22 DIAGNOSIS — R1013 Epigastric pain: Secondary | ICD-10-CM | POA: Insufficient documentation

## 2009-08-24 ENCOUNTER — Ambulatory Visit: Payer: Self-pay | Admitting: Internal Medicine

## 2009-08-25 LAB — CONVERTED CEMR LAB
AST: 16 units/L (ref 0–37)
Albumin: 3.8 g/dL (ref 3.5–5.2)
Basophils Absolute: 0.1 10*3/uL (ref 0.0–0.1)
Basophils Relative: 0.9 % (ref 0.0–3.0)
Bilirubin Urine: NEGATIVE
CO2: 30 meq/L (ref 19–32)
Chloride: 109 meq/L (ref 96–112)
Cholesterol: 165 mg/dL (ref 0–200)
Eosinophils Absolute: 0.1 10*3/uL (ref 0.0–0.7)
Glucose, Bld: 134 mg/dL — ABNORMAL HIGH (ref 70–99)
HCT: 40.5 % (ref 36.0–46.0)
Hemoglobin: 14 g/dL (ref 12.0–15.0)
Hgb A1c MFr Bld: 5.5 % (ref 4.6–6.5)
Leukocytes, UA: NEGATIVE
Lymphs Abs: 1.8 10*3/uL (ref 0.7–4.0)
MCHC: 34.5 g/dL (ref 30.0–36.0)
Monocytes Relative: 5.3 % (ref 3.0–12.0)
Neutro Abs: 5.1 10*3/uL (ref 1.4–7.7)
Nitrite: NEGATIVE
Potassium: 4.4 meq/L (ref 3.5–5.1)
RBC: 4.19 M/uL (ref 3.87–5.11)
RDW: 12.9 % (ref 11.5–14.6)
Sodium: 142 meq/L (ref 135–145)
Specific Gravity, Urine: <=1.005 (ref 1.000–1.030)
TSH: 0.97 microintl units/mL (ref 0.35–5.50)
Total Protein, Urine: NEGATIVE mg/dL
Total Protein: 6.7 g/dL (ref 6.0–8.3)
VLDL: 14.2 mg/dL (ref 0.0–40.0)
Vit D, 25-Hydroxy: 16 ng/mL — ABNORMAL LOW (ref 30–89)
pH: 5.5 (ref 5.0–8.0)

## 2009-08-28 ENCOUNTER — Telehealth: Payer: Self-pay | Admitting: Internal Medicine

## 2009-09-15 LAB — HM MAMMOGRAPHY: HM Mammogram: NORMAL

## 2009-09-27 ENCOUNTER — Ambulatory Visit: Payer: Self-pay | Admitting: Gastroenterology

## 2009-09-27 ENCOUNTER — Ambulatory Visit: Payer: Self-pay | Admitting: Internal Medicine

## 2009-09-27 DIAGNOSIS — M79609 Pain in unspecified limb: Secondary | ICD-10-CM | POA: Insufficient documentation

## 2009-09-27 DIAGNOSIS — E559 Vitamin D deficiency, unspecified: Secondary | ICD-10-CM | POA: Insufficient documentation

## 2009-10-16 ENCOUNTER — Encounter: Payer: Self-pay | Admitting: Internal Medicine

## 2009-10-18 ENCOUNTER — Encounter (INDEPENDENT_AMBULATORY_CARE_PROVIDER_SITE_OTHER): Payer: Self-pay | Admitting: *Deleted

## 2009-10-23 ENCOUNTER — Encounter: Payer: Self-pay | Admitting: Internal Medicine

## 2009-10-24 ENCOUNTER — Ambulatory Visit: Payer: Self-pay | Admitting: Gastroenterology

## 2009-10-24 ENCOUNTER — Ambulatory Visit: Payer: Self-pay | Admitting: Cardiovascular Disease

## 2009-10-24 ENCOUNTER — Telehealth: Payer: Self-pay | Admitting: Internal Medicine

## 2009-10-27 ENCOUNTER — Telehealth: Payer: Self-pay | Admitting: Internal Medicine

## 2009-10-31 ENCOUNTER — Encounter: Payer: Self-pay | Admitting: Internal Medicine

## 2009-10-31 DIAGNOSIS — G5620 Lesion of ulnar nerve, unspecified upper limb: Secondary | ICD-10-CM | POA: Insufficient documentation

## 2009-12-22 ENCOUNTER — Ambulatory Visit: Payer: Self-pay | Admitting: Gastroenterology

## 2010-01-01 ENCOUNTER — Telehealth: Payer: Self-pay | Admitting: Gastroenterology

## 2010-01-05 ENCOUNTER — Encounter: Payer: Self-pay | Admitting: Gastroenterology

## 2010-01-08 ENCOUNTER — Telehealth: Payer: Self-pay | Admitting: Gastroenterology

## 2010-01-31 ENCOUNTER — Ambulatory Visit: Payer: Self-pay | Admitting: Internal Medicine

## 2010-02-21 ENCOUNTER — Ambulatory Visit: Payer: Self-pay | Admitting: Internal Medicine

## 2010-02-21 DIAGNOSIS — M25579 Pain in unspecified ankle and joints of unspecified foot: Secondary | ICD-10-CM | POA: Insufficient documentation

## 2010-03-05 ENCOUNTER — Telehealth: Payer: Self-pay | Admitting: Internal Medicine

## 2010-04-10 ENCOUNTER — Telehealth: Payer: Self-pay | Admitting: Gastroenterology

## 2010-04-10 ENCOUNTER — Encounter: Payer: Self-pay | Admitting: Gastroenterology

## 2010-04-11 ENCOUNTER — Telehealth (INDEPENDENT_AMBULATORY_CARE_PROVIDER_SITE_OTHER): Payer: Self-pay | Admitting: *Deleted

## 2010-04-13 ENCOUNTER — Telehealth: Payer: Self-pay | Admitting: Gastroenterology

## 2010-04-16 ENCOUNTER — Telehealth (INDEPENDENT_AMBULATORY_CARE_PROVIDER_SITE_OTHER): Payer: Self-pay | Admitting: *Deleted

## 2010-04-19 ENCOUNTER — Ambulatory Visit (HOSPITAL_COMMUNITY): Admission: RE | Admit: 2010-04-19 | Discharge: 2010-04-19 | Payer: Self-pay | Admitting: Interventional Radiology

## 2010-05-24 ENCOUNTER — Ambulatory Visit: Payer: Self-pay | Admitting: Internal Medicine

## 2010-05-24 ENCOUNTER — Ambulatory Visit (HOSPITAL_COMMUNITY)
Admission: RE | Admit: 2010-05-24 | Discharge: 2010-05-24 | Payer: Self-pay | Source: Home / Self Care | Attending: Interventional Radiology | Admitting: Interventional Radiology

## 2010-07-07 ENCOUNTER — Encounter: Payer: Self-pay | Admitting: Internal Medicine

## 2010-07-08 ENCOUNTER — Encounter: Payer: Self-pay | Admitting: Interventional Radiology

## 2010-07-08 ENCOUNTER — Encounter: Payer: Self-pay | Admitting: Neurological Surgery

## 2010-07-08 ENCOUNTER — Encounter: Payer: Self-pay | Admitting: Gastroenterology

## 2010-07-09 ENCOUNTER — Ambulatory Visit (HOSPITAL_COMMUNITY)
Admission: RE | Admit: 2010-07-09 | Discharge: 2010-07-09 | Payer: Self-pay | Source: Home / Self Care | Attending: Interventional Radiology | Admitting: Interventional Radiology

## 2010-07-09 LAB — COMPREHENSIVE METABOLIC PANEL
ALT: 17 U/L (ref 0–35)
AST: 16 U/L (ref 0–37)
Albumin: 3.8 g/dL (ref 3.5–5.2)
CO2: 27 mEq/L (ref 19–32)
Chloride: 108 mEq/L (ref 96–112)
Creatinine, Ser: 1.04 mg/dL (ref 0.4–1.2)
GFR calc Af Amer: >60 mL/min (ref >60)
Potassium: 4.5 mEq/L (ref 3.5–5.1)
Sodium: 143 mEq/L (ref 135–145)
Total Bilirubin: 0.3 mg/dL (ref 0.3–1.2)

## 2010-07-09 LAB — CBC
HCT: 39.8 % (ref 36.0–46.0)
Hemoglobin: 12.6 g/dL (ref 12.0–15.0)
MCH: 30.9 pg (ref 26.0–34.0)
MCV: 97.5 fL (ref 78.0–100.0)
Platelets: 223 10*3/uL (ref 150–400)
RBC: 4.08 MIL/uL (ref 3.87–5.11)
WBC: 8.2 10*3/uL (ref 4.0–10.5)

## 2010-07-09 LAB — DIFFERENTIAL
Lymphocytes Relative: 25 % (ref 12–46)
Lymphs Abs: 2.1 10*3/uL (ref 0.7–4.0)
Monocytes Relative: 7 % (ref 3–12)
Neutrophils Relative %: 65 % (ref 43–77)

## 2010-07-09 LAB — APTT: aPTT: 30 seconds (ref 24–37)

## 2010-07-18 ENCOUNTER — Encounter: Payer: Self-pay | Admitting: Internal Medicine

## 2010-07-18 ENCOUNTER — Telehealth (INDEPENDENT_AMBULATORY_CARE_PROVIDER_SITE_OTHER): Payer: Self-pay | Admitting: *Deleted

## 2010-07-19 NOTE — Progress Notes (Signed)
Summary: Triage   Phone Note Call from Patient Call back at Home Phone [phone]   Caller: Patient Call For: Dr. Jarold Motto Reason for Call: Talk to Nurse Summary of Call: would like a referral to Dr. Burtis Junes in [place] for pain management Fax# [phone] Ph#  [phone]....it is closer than the appt. in [place] Initial call taken by: Karna Christmas,  January 08, 2010 4:22 PM  Follow-up for Phone Call        LM for scheduler to call.  Unable to talk with someone. Lupita Leash Surface RN  January 10, 2010 10:32 AM  Awaiting new pt form to fax back.  MD will review and let us know if appt will be scheduled.  Ashok Cordia RN  January 10, 2010 12:16 PM  records faxed. Follow-up by: Ashok Cordia RN,  January 10, 2010 4:10 PM     Appended Document: Triage Appt sch for pt to go to the pain clinic on 01/25/10 at 3:00.  Appended Document: Triage LM for pt to call if she has any questions re appt with pain clinic.

## 2010-07-19 NOTE — Progress Notes (Signed)
Summary: Call Report  Phone Note Other Incoming   Caller: [facility] Call Report Summary of Call: [facility] Triage Call Report Triage Record Num: [id number] Operator: Karenann Cai Patient Name: [name] Call Date & Time: 08/27/2009 2:46:21PM Patient Phone: PCP: Patient Gender: Female PCP Fax : Patient DOB: 1956-03-31 Practice Name: [facility] Reason for Call: Patient started on Cefelexin on 08/23/2009 on Wedneday for sinus infection. Today: extremities are numb and tinglind and she reports dizziness. Afebrile. Onset of symptoms: last night. Patient advised to have family member drive her to [facility] ER now for evaluation and take all meds with her. Protocol(s) Used: Dizziness / Vertigo Recommended Outcome per Protocol: Activate EMS 911 Reason for Outcome: New paralysis (unable to move); new weakness (not due to pain); new loss of coordination (purposeful action) OR new unexplained numbness/tingling involving arm and leg on same side of body Care Advice: Write down provider's name. List or place the following in a bag for transport with the patient: current prescription and/or OTC medications; alternative treatments, therapies and medications; and street drugs.  ~  Initial call taken by: Margaret Pyle, CMA,  August 28, 2009 9:34 AM

## 2010-07-19 NOTE — Progress Notes (Signed)
Summary: Triage   Phone Note Call from Patient Call back at Home Phone [phone]   Caller: Patient Call For: Dr. Jarold Motto Reason for Call: Talk to Nurse Summary of Call: Pt said the Carafate is "not working". She is having severe abdominal pain Initial call taken by: Karna Christmas,  August 21, 2009 8:06 AM  Follow-up for Phone Call        Pt states shomach "is on fire".   C/O abd pain.  medication not helping.  OV scheculed. Follow-up by: Ashok Cordia RN,  August 21, 2009 9:16 AM

## 2010-07-19 NOTE — Assessment & Plan Note (Signed)
Summary: f/u appt//requests b12 shot/cd   Vital Signs:  Patient profile:   55 year old female Height:      65 inches Weight:      151.25 pounds BMI:     25.26 O2 Sat:      97 % on Room air Temp:     98.8 degrees F oral Pulse rate:   98 / minute BP sitting:   100 / 62  (left arm) Cuff size:   regular  Vitals Entered By: Zella Ball Ewing CMA Duncan Dull) (January 31, 2010 2:15 PM)  O2 Flow:  Room air CC: Followup, B-12 shot/RE   Primary Care Provider:  Oliver Barre, MD  CC:  Followup and B-12 shot/RE.  History of Present Illness: here to f/u;  savella no longer working very well, requests to go back to the lyrica despite the constipatoin before at dose of 225 mg/day;  has significnat torso tenderness as well as most of the extremities better with the lyrica before;  No fever, wt loss, night sweats, loss of appetite or other constitutional symptoms  Pt denies CP, sob, doe, wheezing, orthopnea, pnd, worsening LE edema, palps, dizziness or syncope  Pt denies new neuro symptoms such as headache, facial or extremity weakness  Overall very good med complaicne, adn good tolerance.  Next psychiatry appt october which she is good about keeping; requests refills on meds as she is running out before october; overall stable without worsening depression, suicidal ideation, panic , agitation or other behavioral issues she had prior to med use.    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 Aura W/intractable w/o Sm  (ICD-346.71) 8)  Leukocytosis  (ICD-288.60) 9)  Knee Pain, Left  (ICD-719.46) 10)  Peripheral Edema  (ICD-782.3) 11)  Intermittent Vertigo  (ICD-780.4) 12)  Uri  (ICD-465.9) 13)  Pulmonary Nodule  (ICD-518.89) 14)  Dyspnea On Exertion  (ICD-786.09) 15)  COPD   (ICD-496) 16)  Gastroparesis  (ICD-536.3) 17)  Gastritis  (ICD-535.50) 18)  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) 19)  Vitamin B12 Deficiency  (ICD-266.2) 20)  Bipolar I D/o Most Recent Epis Depressed Mild  (ICD-296.51) 21)  Abdominal Pain-generalized  (ICD-789.07) 22)  Fibromyalgia  (ICD-729.1) 23)  Swelling, Mass, or Lump in Chest  (ICD-786.6) 24)  Diarrhea  (ICD-787.91) 25)  Abdominal Pain, Lower  (ICD-789.09) 26)  Weight Loss  (ICD-783.21) 27)  Preventive Health Care  (ICD-V70.0) 28)  Back Pain  (ICD-724.5) 29)  Conjunctivitis  (ICD-372.30) 30)  Sinusitis- Acute-nos  (ICD-461.9) 31)  Hip Pain, Left  (ICD-719.45) 32)  Back Pain  (ICD-724.5) 33)  Allergic Rhinitis  (ICD-477.9) 34)  Ibs  (ICD-564.1) 35)  Gerd  (ICD-530.81) 36)  Hyperlipidemia  (ICD-272.4) 37)  Peripheral Neuropathy  (ICD-356.9) 38)  Colonic Polyps, Hx of  (ICD-V12.72) 39)  Anxiety  (ICD-300.00) 40)  Abdominal Pain, Generalized  (ICD-789.07) 41)  Depression  (ICD-311)  Medications Prior to Update: 1)  Klor-Con 10 10 Meq Tbcr (Potassium Chloride) .Marland Kitchen.. 1po Qd 2)  Crestor 20 Mg  Tabs (Rosuvastatin Calcium) .Marland Kitchen.. 1 By Mouth Once Daily 3)  Pantoprazole Sodium 40 Mg Tbec (Pantoprazole Sodium) .... Take Two Times A Day 4)  Savella 50 Mg Tabs (Milnacipran Hcl) .Marland Kitchen.. 1 By Mouth Two Times A Day 5)  Aspirin 81  Mg Tbec (Aspirin) .... Once Daily 6)  Cyanocobalamin 1000 Mcg/ml Soln (Cyanocobalamin) .... Inject One Ml Im Once A Month 7)  Trazodone Hcl 100 Mg Tabs (Trazodone Hcl) .Marland Kitchen.. 1p O At Bedtime 8)  Tramadol Hcl 50 Mg Tabs (Tramadol Hcl) .Marland Kitchen.. 1 - 2 By Mouth Q 6 Hrs As Needed 9)  Vitamin D3 1000 Unit Tabs (Cholecalciferol) .Marland Kitchen.. 1po Once Daily 10)  Lamotrigine 100 Mg Tabs (Lamotrigine) .Marland Kitchen.. 1 By Mouth Once Daily  Current Medications (verified): 1)  Klor-Con 10 10 Meq Tbcr (Potassium Chloride) .Marland Kitchen.. 1po Qd 2)  Crestor 20 Mg  Tabs (Rosuvastatin Calcium) .Marland Kitchen.. 1 By Mouth Once Daily 3)  Pantoprazole Sodium 40 Mg Tbec (Pantoprazole  Sodium) .... Take Two Times A Day 4)  Lyrica 75 Mg Caps (Pregabalin) .Marland Kitchen.. 1 By Mouth Two Times A Day 5)  Aspirin 81 Mg Tbec (Aspirin) .... Once Daily 6)  Cyanocobalamin 1000 Mcg/ml Soln (Cyanocobalamin) .... Inject One Ml Im Once A Month 7)  Trazodone Hcl 100 Mg Tabs (Trazodone Hcl) .Marland Kitchen.. 1 By Mouth At Bedtime 8)  Tramadol Hcl 50 Mg Tabs (Tramadol Hcl) .Marland Kitchen.. 1 - 2 By Mouth Q 6 Hrs As Needed 9)  Vitamin D3 1000 Unit Tabs (Cholecalciferol) .Marland Kitchen.. 1po Once Daily 10)  Lamotrigine 200 Mg Tabs (Lamotrigine) .Marland Kitchen.. 1po At Bedtime 11)  Wellbutrin Xl 150 Mg Xr24h-Tab (Bupropion Hcl) .Marland Kitchen.. 1 By Mouth Once Daily 12)  Abilify 30 Mg Tabs (Aripiprazole) .Marland Kitchen.. 1 By Mouth Once Daily 13)  Sertraline Hcl 100 Mg Tabs (Sertraline Hcl) .Marland Kitchen.. 1po Once Daily  Allergies (verified): 1)  ! Darvocet 2)  ! * Oxycontin 3)  * Chantix 4)  Nsaids 5)  * Tramadol  Past History:  Past Medical History: Last updated: 09/21/2008 Depression/Bipolar OCD/anxiety migraines brain aneurysm chronic abd pain Colonic polyps, hx of Peripheral neuropathy Fibromyalgia Hyperlipidemia GERD chronic constipation/IBS Allergic rhinitis Gastroparesis COPD  Past Surgical History: Last updated: 09/19/2008 Appendectomy Cholecystectomy Hysterectomy Oophorectomy s/p coil to brain anueyrsm disc surgury Shoulder sugery Elbow surgery  Social History: Last updated: 08/16/2008 Current Smoker  6 cig per day Alcohol use-no Daily Caffeine Use coffee in the AM Illicit Drug Use - no Widow does not work  Risk Factors: Smoking Status: current (07/20/2007)  Review of Systems       all otherwise negative per pt -    Physical Exam  General:  alert and well-developed.   Head:  normocephalic and atraumatic.   Eyes:  vision grossly intact, pupils equal, and pupils round.   Ears:  R ear normal and L ear normal.   Nose:  no external deformity and no nasal discharge.   Mouth:  no gingival abnormalities and pharynx pink and moist.    Neck:  supple and no masses.   Lungs:  normal respiratory effort and normal breath sounds.   Heart:  normal rate and regular rhythm.   Msk:  diffuse trigger point tenderness Extremities:  no edema, no erythema  Psych:  not depressed appearing and slightly anxious.     Impression & Recommendations:  Problem # 1:  FIBROMYALGIA (ICD-729.1)  Her updated medication list for this problem includes:    Aspirin 81 Mg Tbec (Aspirin) ..... Once daily    Tramadol Hcl 50 Mg Tabs (Tramadol hcl) .Marland Kitchen... 1 - 2 by mouth q 6 hrs as needed treat as above, f/u any worsening signs or symptoms , and re-start the lyrica  Problem # 2:  BIPOLAR I D/O MOST RECENT EPIS DEPRESSED MILD (ICD-296.51) stable overall by  hx and exam, ok to continue meds/tx as is  - med refills given to get back to psychiatry  Problem # 3:  COPD (ICD-496) stable overall by hx and exam, ok to continue meds/tx as is - does not require further meds at this time  Problem # 4:  VITAMIN B12 DEFICIENCY (ICD-266.2) cont monthly IM shots - today's given Orders: Vit B12 1000 mcg (J3420) Admin of Therapeutic Inj  intramuscular or subcutaneous (98119)  Complete Medication List: 1)  Klor-con 10 10 Meq Tbcr (Potassium chloride) .Marland Kitchen.. 1po qd 2)  Crestor 20 Mg Tabs (Rosuvastatin calcium) .Marland Kitchen.. 1 by mouth once daily 3)  Pantoprazole Sodium 40 Mg Tbec (Pantoprazole sodium) .... Take two times a day 4)  Lyrica 75 Mg Caps (Pregabalin) .Marland Kitchen.. 1 by mouth two times a day 5)  Aspirin 81 Mg Tbec (Aspirin) .... Once daily 6)  Cyanocobalamin 1000 Mcg/ml Soln (Cyanocobalamin) .... Inject one ml im once a month 7)  Trazodone Hcl 100 Mg Tabs (Trazodone hcl) .Marland Kitchen.. 1 by mouth at bedtime 8)  Tramadol Hcl 50 Mg Tabs (Tramadol hcl) .Marland Kitchen.. 1 - 2 by mouth q 6 hrs as needed 9)  Vitamin D3 1000 Unit Tabs (Cholecalciferol) .Marland Kitchen.. 1po once daily 10)  Lamotrigine 200 Mg Tabs (Lamotrigine) .Marland Kitchen.. 1po at bedtime 11)  Wellbutrin Xl 150 Mg Xr24h-tab (Bupropion hcl) .Marland Kitchen.. 1 by mouth  once daily 12)  Abilify 30 Mg Tabs (Aripiprazole) .Marland Kitchen.. 1 by mouth once daily 13)  Sertraline Hcl 100 Mg Tabs (Sertraline hcl) .Marland Kitchen.. 1po once daily  Patient Instructions: 1)  You had the B12 shot today 2)  Please take all new medications as prescribed 3)  Continue all previous medications as before this visit  4)  Please schedule a follow-up appointment in 6 months. with CPX labs Prescriptions: TRAZODONE HCL 100 MG TABS (TRAZODONE HCL) 1 by mouth at bedtime  #30 x 5   Entered and Authorized by:   Corwin Levins MD   Signed by:   Corwin Levins MD on 01/31/2010   Method used:   Print then Give to Patient   RxID:   [id number] SERTRALINE HCL 100 MG TABS (SERTRALINE HCL) 1po once daily  #30 x 11   Entered and Authorized by:   Corwin Levins MD   Signed by:   Corwin Levins MD on 01/31/2010   Method used:   Print then Give to Patient   RxID:   [id number] ABILIFY 30 MG TABS (ARIPIPRAZOLE) 1 by mouth once daily  #30 x 11   Entered and Authorized by:   Corwin Levins MD   Signed by:   Corwin Levins MD on 01/31/2010   Method used:   Print then Give to Patient   RxID:   [id number] WELLBUTRIN XL 150 MG XR24H-TAB (BUPROPION HCL) 1 by mouth once daily  #30 x 11   Entered and Authorized by:   Corwin Levins MD   Signed by:   Corwin Levins MD on 01/31/2010   Method used:   Print then Give to Patient   RxID:   [id number] LAMOTRIGINE 200 MG TABS (LAMOTRIGINE) 1po at bedtime  #30 x 11   Entered and Authorized by:   Corwin Levins MD   Signed by:   Corwin Levins MD on 01/31/2010   Method used:   Print then Give to Patient   RxID:   [id number] LYRICA 75 MG CAPS (PREGABALIN) 1 by mouth two times a day  #60 x  5   Entered and Authorized by:   Corwin Levins MD   Signed by:   Corwin Levins MD on 01/31/2010   Method used:   Print then Give to Patient   RxID:   [id number]    Medication Administration  Injection # 1:    Medication: Vit B12 1000 mcg    Diagnosis: VITAMIN B12  DEFICIENCY (ICD-266.2)    Route: IM    Site: R deltoid    Exp Date: 09/2011    Lot #: [id number]    Mfr: [employer] Regent    Patient tolerated injection without complications    Given by: Zella Ball Ewing CMA Duncan Dull) (January 31, 2010 2:20 PM)  Orders Added: 1)  Vit B12 1000 mcg [J3420] 2)  Admin of Therapeutic Inj  intramuscular or subcutaneous [96372] 3)  Est. Patient Level IV [14782]

## 2010-07-19 NOTE — Progress Notes (Signed)
Summary: Rx refill req  Phone Note Call from Patient Call back at Home Phone [phone]   Caller: Patient Summary of Call: Pt called stating that swelling in her leg has decreased but she is still experiencing pain. Pt is request refill of pain med. Initial call taken by: Margaret Pyle, CMA,  March 05, 2010 11:01 AM  Follow-up for Phone Call        ok this time, but will need OV if needs further refills  done hardcopy to LIM side B - dahlia  Follow-up by: Corwin Levins MD,  March 05, 2010 11:58 AM    Prescriptions: OXYCODONE HCL 5 MG TABS (OXYCODONE HCL) 1-2 by mouth q 6 hrs as needed pain  #50 x 0   Entered and Authorized by:   Corwin Levins MD   Signed by:   Corwin Levins MD on 03/05/2010   Method used:   Print then Give to Patient   RxID:   [id number]  done hardcopy to LIM side B - dahlia  Corwin Levins MD  March 05, 2010 11:58 AM   Message left with pt's daughter that requested Rx is available for pick (in cabinet up front) and OV will be needed for further refills. Margaret Pyle, CMA  March 05, 2010 1:29 PM

## 2010-07-19 NOTE — Assessment & Plan Note (Signed)
Summary: monthly b12./lk  Nurse Visit   Allergies: 1)  ! Darvocet 2)  ! * Oxycontin 3)  * Chantix 4)  Nsaids 5)  * Tramadol  Medication Administration  Injection # 1:    Medication: Vit B12 1000 mcg    Diagnosis: VITAMIN B12 DEFICIENCY (ICD-266.2)    Route: IM    Site: L deltoid    Exp Date: 09/16/2011    Lot #: [id number]    Mfr: [employer] Regent    Patient tolerated injection without complications    Given by: Lowry Ram NCMA (December 22, 2009 8:50 AM)  Orders Added: 1)  Vit B12 1000 mcg [J3420] Made pt appointment for next monthly B12, date, 01-19-10 at 10 AM.  Appointment card provided.

## 2010-07-19 NOTE — Assessment & Plan Note (Signed)
Summary: Abd pain/dfs    History of Present Illness Visit Type: Follow-up Visit Primary GI MD: Sheryn Bison MD FACP FAGA Primary Provider: Oliver Barre, MD Requesting Provider: n/a Chief Complaint: Epigastric pain with burning History of Present Illness:   This patient is a 55 year old white female with multiple somatic and functional complaints and chronic bipolar disorder, 2CD, anxiety, multiple drug allergies, who continues to have epigastric abdominal pain despite multiple negative GI evaluations including recent referral to [facility] in [facility] where repeat endoscopy and CT angiography was normal. She's gained 25 pounds in weight and denies any complaints except for burning epigastric pain which has been present for many years. One-month trial of liquid Carafate 4-5 times a day has not helped her complaints. She denies specific hepatobiliary or lower gastrointestinal problems. She has had multiple surgical procedures include an appendectomy, hysterectomy, and cholecystectomy, refractory, neurosurgery, and orthopedic procedures. She denies abuse of NSAIDs,  alcohol, but continues to smoke.   GI Review of Systems    Reports abdominal pain.     Location of  Abdominal pain: epigastric area.    Denies acid reflux, belching, bloating, chest pain, dysphagia with liquids, dysphagia with solids, heartburn, loss of appetite, nausea, vomiting, vomiting blood, weight loss, and  weight gain.        Denies anal fissure, black tarry stools, change in bowel habit, constipation, diarrhea, diverticulosis, fecal incontinence, heme positive stool, hemorrhoids, irritable bowel syndrome, jaundice, light color stool, liver problems, rectal bleeding, and  rectal pain.    Current Medications (verified): 1)  Klor-Con 10 10 Meq Tbcr (Potassium Chloride) .Marland Kitchen.. 1po Qd 2)  Crestor 20 Mg  Tabs (Rosuvastatin Calcium) .Marland Kitchen.. 1 By Mouth Once Daily 3)  Pantoprazole Sodium 40 Mg Tbec  (Pantoprazole Sodium) .... Take 1 Tab By Mouth Once Daily 30 Minutes Before Breakfast. 4)  Savella 50 Mg Tabs (Milnacipran Hcl) .Marland Kitchen.. 1 By Mouth Two Times A Day 5)  Aspirin 81 Mg Tbec (Aspirin) .... Once Daily 6)  Cyanocobalamin 1000 Mcg/ml Soln (Cyanocobalamin) .... Inject One Ml Im Once A Month 7)  Trazodone Hcl 100 Mg Tabs (Trazodone Hcl) .Marland Kitchen.. 1p O At Bedtime 8)  Carafate 1 Gm/9ml  Susp (Sucralfate) .Marland Kitchen.. 1 Gm Qid  Allergies (verified): 1)  ! Darvocet 2)  ! * Oxycontin 3)  * Chantix 4)  Nsaids 5)  * Tramadol  Past History:  Past medical, surgical, family and social histories (including risk factors) reviewed for relevance to current acute and chronic problems.  Past Medical History: Reviewed history from 09/21/2008 and no changes required. Depression/Bipolar OCD/anxiety migraines brain aneurysm chronic abd pain Colonic polyps, hx of Peripheral neuropathy Fibromyalgia Hyperlipidemia GERD chronic constipation/IBS Allergic rhinitis Gastroparesis COPD  Past Surgical History: Reviewed history from 09/19/2008 and no changes required. Appendectomy Cholecystectomy Hysterectomy Oophorectomy s/p coil to brain anueyrsm disc surgury Shoulder sugery Elbow surgery  Family History: Reviewed history from 09/19/2008 and no changes required. CAD cancer DM HTN seizure No FH of Colon Cancer: Lung cancer: Father Family History of Stomach Cancer:Sister Brain Cancer: Mother Leukemia: Sister  Social History: Reviewed history from 08/16/2008 and no changes required. Current Smoker  6 cig per day Alcohol use-no Daily Caffeine Use coffee in the AM Illicit Drug Use - no Widow does not work  Review of Systems       The patient complains of allergy/sinus, cough, fatigue, and sleeping problems.  The patient denies anemia, anxiety-new, arthritis/joint pain, back pain, blood in urine, breast changes/lumps, change in vision, confusion, coughing  up blood, depression-new, fainting,  fever, headaches-new, hearing problems, heart murmur, heart rhythm changes, itching, menstrual pain, muscle pains/cramps, night sweats, nosebleeds, pregnancy symptoms, shortness of breath, skin rash, sore throat, swelling of feet/legs, swollen lymph glands, thirst - excessive , urination - excessive , urination changes/pain, urine leakage, vision changes, and voice change.    Vital Signs:  Patient profile:   55 year old female Height:      64 inches Weight:      149 pounds BMI:     25.67 BSA:     1.73 Pulse rate:   88 / minute Pulse rhythm:   regular BP sitting:   98 / 60  (left arm) Cuff size:   regular  Vitals Entered By: Ok Anis CMA (August 22, 2009 11:26 AM)  Physical Exam  General:  Well developed, well nourished, no acute distress.healthy appearing.   Head:  Normocephalic and atraumatic. Eyes:  exam deferred to patient's ophthalmologist.   Lungs:  Clear throughout to auscultation. Heart:  Regular rate and rhythm; no murmurs, rubs,  or bruits. Abdomen:  Soft, nontender and nondistended. No masses, hepatosplenomegaly or hernias noted. Normal bowel sounds. Pulses:  Normal pulses noted. Extremities:  No clubbing, cyanosis, edema or deformities noted. Neurologic:  Alert and  oriented x4;  grossly normal neurologically. Skin:  Intact without significant lesions or rashes. Psych:  Alert and cooperative. Normal mood and affect.   Impression & Recommendations:  Problem # 1:  BIPOLAR I D/O MOST RECENT EPIS DEPRESSED MILD (ICD-296.51) Assessment Unchanged psychotropic medications and psychiatric followup  Problem # 2:  ABDOMINAL PAIN-EPIGASTRIC (ICD-789.06) Assessment: Unchanged This is functional dyspepsia and I doubt we will have her resolve all her abdominal pain issues. I am surprised that she has gained 25 pounds of weight without a specific therapy. In fact, she appears healthy as I have ever seen her. I will stop Carafate and try twice a day Protonix. A service he noted  need for repeat endoscopy or further GI testing at this point.  Problem # 3:  COPD (ICD-496) Assessment: Unchanged she continues to smoke despite medical advice.  Patient Instructions: 1)  Increase Pantoprazole to two times a day. 2)  Stop carafate. 3)  The medication list was reviewed and reconciled.  All changed / newly prescribed medications were explained.  A complete medication list was provided to the patient / caregiver. 4)  Copy sent to : Dr. Oliver Barre 5)  Please schedule a follow-up appointment as needed.  Prescriptions: PANTOPRAZOLE SODIUM 40 MG TBEC (PANTOPRAZOLE SODIUM) Take two times a day  #60 x 11   Entered by:   Ashok Cordia RN   Authorized by:   Mardella Layman MD [facility]   Signed by:   Ashok Cordia RN on 08/22/2009   Method used:   Electronically to        [facility] [phone]* (retail)       [address] [place], [place]  [address]       Ph: [phone]       Fax: [phone]   RxID:   [id number]   Appended Document: Abd pain/dfs    Clinical Lists Changes  Orders: Added new Service order of Vit B12 1000 mcg (J3420) - Signed       Medication Administration  Injection # 1:    Medication: Vit B12 1000 mcg    Diagnosis: VITAMIN B12 DEFICIENCY (ICD-266.2)    Route: IM  Site: L deltoid    Exp Date: 11/12    Lot #: [id number]    Mfr: American Regent    Patient tolerated injection without complications    Given by: Ashok Cordia RN (August 22, 2009 12:21 PM)  Orders Added: 1)  Vit B12 1000 mcg [J3420]

## 2010-07-19 NOTE — Progress Notes (Signed)
Summary: Question about letter she received   Phone Note Call from Patient Call back at [phone]   Call For: Dr Jarold Motto Summary of Call: Has a question about the certified letter she received from Dr Jarold Motto Initial call taken by: Leanor Kail [facility],  April 13, 2010 9:41 AM  Follow-up for Phone Call        patient questions why she was discharged, I advised her she did not comply with Dr. Jarold Motto medical advice when he referred her to Dr. Celene Squibb office she cxed 3 times and now can not be seen at their office either. I also advised her that she can come here to sign a release or go to a new GI MD and sign it at their office to get her records. She verbalized understanding.  Follow-up by: Harlow Mares CMA Duncan Dull),  April 13, 2010 10:46 AM

## 2010-07-19 NOTE — Miscellaneous (Signed)
Summary: Orders Update   Clinical Lists Changes  Problems: Added new problem of ULNAR NEUROPATHY (ICD-354.2) Orders: Added new Referral order of Neurosurgeon Referral (Neurosurgeon) - Signed

## 2010-07-19 NOTE — Progress Notes (Signed)
Summary: Dismissal Letter Sent by Certified Mail  Dismissal Letter sent by certified mail. Vara Guardian  April 11, 2010 8:58 AM

## 2010-07-19 NOTE — Progress Notes (Signed)
Summary: Returned Receipt Received Verifying Letter Delivered to Patient  Returned receipt received verifing delivery of letter. Vara Guardian  April 16, 2010 3:58 PM

## 2010-07-19 NOTE — Assessment & Plan Note (Signed)
Summary: HEADACHE/ SINUS ? /NWS   Vital Signs:  Patient profile:   55 year old female Height:      65 inches Weight:      149.50 pounds BMI:     24.97 O2 Sat:      99 % on Room air Temp:     99 degrees F oral Pulse rate:   106 / minute BP sitting:   110 / 80  (left arm) Cuff size:   regular  Vitals Entered ByZella Ball Ewing (August 24, 2009 11:28 AM)  O2 Flow:  Room air  CC: Headache for 3 weeks, sinus and ear pressure/RE   Primary Care Provider:  Oliver Barre, MD  CC:  Headache for 3 weeks and sinus and ear pressure/RE.  History of Present Illness: here for wellness, incidently with 2 days onset mild to mod facial pain, pressure, fever and greenish d/c, with mild ST, and Pt denies CP, sob, doe, wheezing, orthopnea, pnd, worsening LE edema, palps, dizziness or syncope   Problems Prior to Update: 1)  Preventive Health Care  (ICD-V70.0) 2)  Abdominal Pain-epigastric  (ICD-789.06) 3)  Back Pain  (ICD-724.5) 4)  Chronic Migraine w/o Aura W/intractable w/o Sm  (ICD-346.71) 5)  Leukocytosis  (ICD-288.60) 6)  Knee Pain, Left  (ICD-719.46) 7)  Peripheral Edema  (ICD-782.3) 8)  Intermittent Vertigo  (ICD-780.4) 9)  Uri  (ICD-465.9) 10)  Pulmonary Nodule  (ICD-518.89) 11)  Dyspnea On Exertion  (ICD-786.09) 12)  COPD  (ICD-496) 13)  Gastroparesis  (ICD-536.3) 14)  Gastritis  (ICD-535.50) 15)  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) 16)  Vitamin B12 Deficiency  (ICD-266.2) 17)  Bipolar I D/o Most Recent Epis Depressed Mild  (ICD-296.51) 18)  Abdominal Pain-generalized  (ICD-789.07) 19)  Fibromyalgia  (ICD-729.1) 20)  Swelling, Mass, or Lump in Chest  (ICD-786.6) 21)  Diarrhea  (ICD-787.91) 22)  Abdominal Pain, Lower  (ICD-789.09) 23)  Weight Loss  (ICD-783.21) 24)  Preventive Health Care  (ICD-V70.0) 25)  Back Pain  (ICD-724.5) 26)  Conjunctivitis  (ICD-372.30) 27)  Sinusitis- Acute-nos  (ICD-461.9) 28)  Hip Pain, Left  (ICD-719.45) 29)  Back Pain  (ICD-724.5) 30)  Allergic  Rhinitis  (ICD-477.9) 31)  Ibs  (ICD-564.1) 32)  Gerd  (ICD-530.81) 33)  Hyperlipidemia  (ICD-272.4) 34)  Peripheral Neuropathy  (ICD-356.9) 35)  Colonic Polyps, Hx of  (ICD-V12.72) 36)  Anxiety  (ICD-300.00) 37)  Abdominal Pain, Generalized  (ICD-789.07) 38)  Depression  (ICD-311)  Medications Prior to Update: 1)  Klor-Con 10 10 Meq Tbcr (Potassium Chloride) .Marland Kitchen.. 1po Qd 2)  Crestor 20 Mg  Tabs (Rosuvastatin Calcium) .Marland Kitchen.. 1 By Mouth Once Daily 3)  Pantoprazole Sodium 40 Mg Tbec (Pantoprazole Sodium) .... Take Two Times A Day 4)  Savella 50 Mg Tabs (Milnacipran Hcl) .Marland Kitchen.. 1 By Mouth Two Times A Day 5)  Aspirin 81 Mg Tbec (Aspirin) .... Once Daily 6)  Cyanocobalamin 1000 Mcg/ml Soln (Cyanocobalamin) .... Inject One Ml Im Once A Month 7)  Trazodone Hcl 100 Mg Tabs (Trazodone Hcl) .Marland Kitchen.. 1p O At Bedtime  Current Medications (verified): 1)  Klor-Con 10 10 Meq Tbcr (Potassium Chloride) .Marland Kitchen.. 1po Qd 2)  Crestor 20 Mg  Tabs (Rosuvastatin Calcium) .Marland Kitchen.. 1 By Mouth Once Daily 3)  Pantoprazole Sodium 40 Mg Tbec (Pantoprazole Sodium) .... Take Two Times A Day 4)  Savella 50 Mg Tabs (Milnacipran Hcl) .Marland Kitchen.. 1 By Mouth Two Times A Day 5)  Aspirin 81 Mg Tbec (Aspirin) .... Once Daily 6)  Cyanocobalamin 1000 Mcg/ml  Soln (Cyanocobalamin) .... Inject One Ml Im Once A Month 7)  Trazodone Hcl 100 Mg Tabs (Trazodone Hcl) .Marland Kitchen.. 1p O At Bedtime 8)  Cephalexin 500 Mg Caps (Cephalexin) .Marland Kitchen.. 1po Three Times A Day  Allergies (verified): 1)  ! Darvocet 2)  ! * Oxycontin 3)  * Chantix 4)  Nsaids 5)  * Tramadol  Past History:  Past Medical History: Last updated: 09/21/2008 Depression/Bipolar OCD/anxiety migraines brain aneurysm chronic abd pain Colonic polyps, hx of Peripheral neuropathy Fibromyalgia Hyperlipidemia GERD chronic constipation/IBS Allergic rhinitis Gastroparesis COPD  Past Surgical History: Last updated: 09/19/2008 Appendectomy Cholecystectomy Hysterectomy Oophorectomy s/p coil  to brain anueyrsm disc surgury Shoulder sugery Elbow surgery  Family History: Last updated: 09/19/2008 CAD cancer DM HTN seizure No FH of Colon Cancer: Lung cancer: Father Family History of Stomach Cancer:Sister Brain Cancer: Mother Leukemia: Sister  Social History: Last updated: 08/16/2008 Current Smoker  6 cig per day Alcohol use-no Daily Caffeine Use coffee in the AM Illicit Drug Use - no Widow does not work  Risk Factors: Smoking Status: current (07/20/2007)  Review of Systems  The patient denies anorexia, weight loss, weight gain, vision loss, decreased hearing, hoarseness, chest pain, syncope, dyspnea on exertion, peripheral edema, headaches, hemoptysis, abdominal pain, melena, hematochezia, severe indigestion/heartburn, hematuria, muscle weakness, suspicious skin lesions, transient blindness, difficulty walking, unusual weight change, abnormal bleeding, enlarged lymph nodes, and angioedema.         all otherwise negative per pt -    Physical Exam  General:  alert and well-developed.  , mild ill  Head:  normocephalic and atraumatic.   Eyes:  vision grossly intact, pupils equal, and pupils round.   Ears:  bilat tm's red, sinus tender bilat Nose:  nasal dischargemucosal pallor and mucosal edema.   Mouth:  pharyngeal erythema and fair dentition.   Neck:  supple and cervical lymphadenopathy.   Lungs:  normal respiratory effort and normal breath sounds.   Heart:  normal rate and regular rhythm.   Abdomen:  soft, non-tender, and normal bowel sounds.   Msk:  no joint tenderness and no joint swelling.   Extremities:  no edema, no erythema  Neurologic:  cranial nerves II-XII intact and strength normal in all extremities.     Impression & Recommendations:  Problem # 1:  Preventive Health Care (ICD-V70.0)  Overall doing well, age appropriate education and counseling updated and referral for appropriate preventive services done unless declined, immunizations up to  date or declined, diet counseling done if overweight, urged to quit smoking if smokes , most recent labs reviewed and current ordered if appropriate, ecg reviewed or declined (interpretation per ECG scanned in the EMR if done); information regarding Medicare Prevention requirements given if appropriate   Orders: T-Vitamin D (25-Hydroxy) (21308-65784) TLB-BMP (Basic Metabolic Panel-BMET) (80048-METABOL) TLB-CBC Platelet - w/Differential (85025-CBCD) TLB-Hepatic/Liver Function Pnl (80076-HEPATIC) TLB-Lipid Panel (80061-LIPID) TLB-TSH (Thyroid Stimulating Hormone) (84443-TSH) TLB-Udip ONLY (81003-UDIP)  Problem # 2:  SINUSITIS- ACUTE-NOS (ICD-461.9)  Her updated medication list for this problem includes:    Cephalexin 500 Mg Caps (Cephalexin) .Marland Kitchen... 1po three times a day treat as above, f/u any worsening signs or symptoms   Complete Medication List: 1)  Klor-con 10 10 Meq Tbcr (Potassium chloride) .Marland Kitchen.. 1po qd 2)  Crestor 20 Mg Tabs (Rosuvastatin calcium) .Marland Kitchen.. 1 by mouth once daily 3)  Pantoprazole Sodium 40 Mg Tbec (Pantoprazole sodium) .... Take two times a day 4)  Savella 50 Mg Tabs (Milnacipran hcl) .Marland Kitchen.. 1 by mouth two times a day 5)  Aspirin 81 Mg Tbec (Aspirin) .... Once daily 6)  Cyanocobalamin 1000 Mcg/ml Soln (Cyanocobalamin) .... Inject one ml im once a month 7)  Trazodone Hcl 100 Mg Tabs (Trazodone hcl) .Marland Kitchen.. 1p o at bedtime 8)  Cephalexin 500 Mg Caps (Cephalexin) .Marland Kitchen.. 1po three times a day  Patient Instructions: 1)  Please take all new medications as prescribed - the antibiotic was sent to the pharmacy 2)  Continue all previous medications as before this visit  3)  Please go to the Lab in the basement for your blood and/or urine tests today 4)  Please schedule a follow-up appointment in 1 year or sooner if needed Prescriptions: CEPHALEXIN 500 MG CAPS (CEPHALEXIN) 1po three times a day  #30 x 0   Entered and Authorized by:   Corwin Levins MD   Signed by:   Corwin Levins MD on  08/24/2009   Method used:   Electronically to        [facility] [phone]* (retail)       [address] [place], [place]  [address]       Ph: [phone]       Fax: [phone]   RxID:   [id number]

## 2010-07-19 NOTE — Progress Notes (Signed)
Summary: FYI.Marland KitchenMarland KitchenMarland KitchenCanceled appt. w/Pain Center   Phone Note From Other Clinic   Caller: [facility] @ Dr. Burtis Junes  [phone] Call For: Dr. Jarold Motto Summary of Call: FYI......Pt. had appt. for tomorrow and canceled. This is the 3rd time she has canceled. Dr. Oneal Grout only allows 2 cancelations and will not be able to r/s appt. Initial call taken by: Karna Christmas,  April 10, 2010 2:13 PM  Follow-up for Phone Call        I am going to discharge this patient from my care. Please give me the appropriate data 2 proceed and notify Dr. Jonny Ruiz. Follow-up by: Mardella Layman MD FACG,  April 10, 2010 2:32 PM  Additional Follow-up for Phone Call Additional follow up Details #1::        Dr. Jarold Motto Given the dismissal form and he will dictate a dismissal letter. I will forward this to Dr. Jonny Ruiz. Additional Follow-up by: Harlow Mares CMA Duncan Dull),  April 10, 2010 2:41 PM

## 2010-07-19 NOTE — Assessment & Plan Note (Signed)
Summary: POST ER/ FOOT IS SWOLLEN /NWS #   Vital Signs:  Patient profile:   55 year old female Height:      65 inches Weight:      160 pounds BMI:     26.72 O2 Sat:      97 % on Room air Temp:     98.5 degrees F oral Pulse rate:   85 / minute BP sitting:   100 / 64  (left arm) Cuff size:   regular  Vitals Entered By: Zella Ball Ewing CMA Duncan Dull) (February 21, 2010 4:17 PM)  O2 Flow:  Room air CC: Post ER, Right foot swollen and painful/RE   Primary Care Provider:  Oliver Barre, MD  CC:  Post ER and Right foot swollen and painful/RE.  History of Present Illness: here in f/u after being seen at ER 3 days ago outside [place] when she was out of town;  was seen for marked pain to the RLE with swelling - per pt DVT ruled out after neg venous doppler u/s, but sent home without other specific tx.  Pain seemed to start sudden, severe to start, constant and persistent, mostly at the right ankle and assoc with sweling of the foot and leg somewhat above the ankle.  No fever, trauma.  OTC advil no help.  no prior hx of gout, not on diuretic.  Uric acid level not known.  Pt denies CP, worsening sob, doe, wheezing, orthopnea, pnd, worsening LE edema, palps, dizziness or syncope  Pt denies new neuro symptoms such as headache, facial or extremity weakness No fever, wt loss, night sweats, loss of appetite or other constitutional symptoms .  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) 3)  Vitamin D Deficiency  (ICD-268.9) 4)  Arm Pain, Right  (ICD-729.5) 5)  Preventive Health Care  (ICD-V70.0) 6)  Abdominal Pain-epigastric  (ICD-789.06) 7)  Back Pain  (ICD-724.5) 8)  Chronic Migraine w/o Aura W/intractable w/o Sm  (ICD-346.71) 9)  Leukocytosis  (ICD-288.60) 10)  Knee Pain, Left  (ICD-719.46) 11)  Peripheral Edema  (ICD-782.3) 12)  Intermittent Vertigo  (ICD-780.4) 13)  Uri  (ICD-465.9) 14)  Pulmonary Nodule  (ICD-518.89) 15)  Dyspnea On  Exertion  (ICD-786.09) 16)  COPD  (ICD-496) 17)  Gastroparesis  (ICD-536.3) 18)  Gastritis  (ICD-535.50) 19)  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) 20)  Vitamin B12 Deficiency  (ICD-266.2) 21)  Bipolar I D/o Most Recent Epis Depressed Mild  (ICD-296.51) 22)  Abdominal Pain-generalized  (ICD-789.07) 23)  Fibromyalgia  (ICD-729.1) 24)  Swelling, Mass, or Lump in Chest  (ICD-786.6) 25)  Diarrhea  (ICD-787.91) 26)  Abdominal Pain, Lower  (ICD-789.09) 27)  Weight Loss  (ICD-783.21) 28)  Preventive Health Care  (ICD-V70.0) 29)  Back Pain  (ICD-724.5) 30)  Conjunctivitis  (ICD-372.30) 31)  Sinusitis- Acute-nos  (ICD-461.9) 32)  Hip Pain, Left  (ICD-719.45) 33)  Back Pain  (ICD-724.5) 34)  Allergic Rhinitis  (ICD-477.9) 35)  Ibs  (ICD-564.1) 36)  Gerd  (ICD-530.81) 37)  Hyperlipidemia  (ICD-272.4) 38)  Peripheral Neuropathy  (ICD-356.9) 39)  Colonic Polyps, Hx of  (ICD-V12.72) 40)  Anxiety  (ICD-300.00) 41)  Abdominal Pain, Generalized  (ICD-789.07) 42)  Depression  (ICD-311)  Medications Prior to Update: 1)  Klor-Con 10 10 Meq Tbcr (Potassium Chloride) .Marland Kitchen.. 1po Qd 2)  Crestor 20 Mg  Tabs (Rosuvastatin Calcium) .Marland Kitchen.. 1 By Mouth Once Daily 3)  Pantoprazole Sodium  40 Mg Tbec (Pantoprazole Sodium) .... Take Two Times A Day 4)  Lyrica 75 Mg Caps (Pregabalin) .Marland Kitchen.. 1 By Mouth Two Times A Day 5)  Aspirin 81 Mg Tbec (Aspirin) .... Once Daily 6)  Cyanocobalamin 1000 Mcg/ml Soln (Cyanocobalamin) .... Inject One Ml Im Once A Month 7)  Trazodone Hcl 100 Mg Tabs (Trazodone Hcl) .Marland Kitchen.. 1 By Mouth At Bedtime 8)  Tramadol Hcl 50 Mg Tabs (Tramadol Hcl) .Marland Kitchen.. 1 - 2 By Mouth Q 6 Hrs As Needed 9)  Vitamin D3 1000 Unit Tabs (Cholecalciferol) .Marland Kitchen.. 1po Once Daily 10)  Lamotrigine 200 Mg Tabs (Lamotrigine) .Marland Kitchen.. 1po At Bedtime 11)  Wellbutrin Xl 150 Mg Xr24h-Tab (Bupropion Hcl) .Marland Kitchen.. 1 By Mouth Once Daily 12)  Abilify 30 Mg Tabs (Aripiprazole) .Marland Kitchen.. 1 By Mouth Once Daily 13)  Sertraline Hcl 100 Mg Tabs (Sertraline  Hcl) .Marland Kitchen.. 1po Once Daily  Current Medications (verified): 1)  Klor-Con 10 10 Meq Tbcr (Potassium Chloride) .Marland Kitchen.. 1po Qd 2)  Crestor 20 Mg  Tabs (Rosuvastatin Calcium) .Marland Kitchen.. 1 By Mouth Once Daily 3)  Pantoprazole Sodium 40 Mg Tbec (Pantoprazole Sodium) .... Take Two Times A Day 4)  Lyrica 75 Mg Caps (Pregabalin) .Marland Kitchen.. 1 By Mouth Two Times A Day 5)  Aspirin 81 Mg Tbec (Aspirin) .... Once Daily 6)  Cyanocobalamin 1000 Mcg/ml Soln (Cyanocobalamin) .... Inject One Ml Im Once A Month 7)  Trazodone Hcl 100 Mg Tabs (Trazodone Hcl) .Marland Kitchen.. 1 By Mouth At Bedtime 8)  Tramadol Hcl 50 Mg Tabs (Tramadol Hcl) .Marland Kitchen.. 1 - 2 By Mouth Q 6 Hrs As Needed 9)  Vitamin D3 1000 Unit Tabs (Cholecalciferol) .Marland Kitchen.. 1po Once Daily 10)  Lamotrigine 200 Mg Tabs (Lamotrigine) .Marland Kitchen.. 1po At Bedtime 11)  Wellbutrin Xl 150 Mg Xr24h-Tab (Bupropion Hcl) .Marland Kitchen.. 1 By Mouth Once Daily 12)  Abilify 30 Mg Tabs (Aripiprazole) .Marland Kitchen.. 1 By Mouth Once Daily 13)  Sertraline Hcl 100 Mg Tabs (Sertraline Hcl) .Marland Kitchen.. 1po Once Daily 14)  Oxycodone Hcl 5 Mg Tabs (Oxycodone Hcl) .Marland Kitchen.. 1-2 By Mouth Q 6 Hrs As Needed Pain 15)  Prednisone 10 Mg Tabs (Prednisone) .... 4po Qd For 3days, Then 3po Qd For 3days, Then 2po Qd For 3days, Then 1po Qd For 3 Days, Then Stop  Allergies (verified): 1)  ! Darvocet 2)  ! * Oxycontin 3)  * Chantix 4)  Nsaids 5)  * Tramadol  Past History:  Past Medical History: Last updated: 09/21/2008 Depression/Bipolar OCD/anxiety migraines brain aneurysm chronic abd pain Colonic polyps, hx of Peripheral neuropathy Fibromyalgia Hyperlipidemia GERD chronic constipation/IBS Allergic rhinitis Gastroparesis COPD  Past Surgical History: Last updated: 09/19/2008 Appendectomy Cholecystectomy Hysterectomy Oophorectomy s/p coil to brain anueyrsm disc surgury Shoulder sugery Elbow surgery  Social History: Last updated: 08/16/2008 Current Smoker  6 cig per day Alcohol use-no Daily Caffeine Use coffee in the AM Illicit Drug  Use - no Widow does not work  Risk Factors: Smoking Status: current (07/20/2007)  Review of Systems       all otherwise negative per pt -    Physical Exam  General:  alert and overweight-appearing.   Head:  normocephalic and atraumatic.   Eyes:  vision grossly intact, pupils equal, and pupils round.   Ears:  R ear normal and L ear normal.   Nose:  no external deformity and no nasal discharge.   Mouth:  no gingival abnormalities and pharynx pink and moist.   Neck:  supple and no masses.   Lungs:  normal respiratory effort and normal breath  sounds.   Heart:  normal rate and regular rhythm.   Msk:  severe right ankle tender with effusion, no bruising  Pulses:  1+ bilat dorsalis pedis Extremities:  LLE no edema, no erythema ,  RLE with marked swelling to distal third of leg, ankle and foot Neurologic:  strength normal in all extremities and sensation intact to light touch.   Skin:  no rashes.   Psych:  slightly anxious.     Impression & Recommendations:  Problem # 1:  ANKLE PAIN, RIGHT (ICD-719.47)  clinically c/w first episode gout; declines lab today, will check uric acid next visit;  and tx with pain med, and pred pack asd ; pt educated and reassured about clinical dx, treatment options on what to expect with tx;  consider allopurinol for recurrence  Orders: Depo- Medrol 40mg  (J1030) Depo- Medrol 80mg  (J1040) Admin of Therapeutic Inj  intramuscular or subcutaneous (04540)  Complete Medication List: 1)  Klor-con 10 10 Meq Tbcr (Potassium chloride) .Marland Kitchen.. 1po qd 2)  Crestor 20 Mg Tabs (Rosuvastatin calcium) .Marland Kitchen.. 1 by mouth once daily 3)  Pantoprazole Sodium 40 Mg Tbec (Pantoprazole sodium) .... Take two times a day 4)  Lyrica 75 Mg Caps (Pregabalin) .Marland Kitchen.. 1 by mouth two times a day 5)  Aspirin 81 Mg Tbec (Aspirin) .... Once daily 6)  Cyanocobalamin 1000 Mcg/ml Soln (Cyanocobalamin) .... Inject one ml im once a month 7)  Trazodone Hcl 100 Mg Tabs (Trazodone hcl) .Marland Kitchen.. 1 by  mouth at bedtime 8)  Tramadol Hcl 50 Mg Tabs (Tramadol hcl) .Marland Kitchen.. 1 - 2 by mouth q 6 hrs as needed 9)  Vitamin D3 1000 Unit Tabs (Cholecalciferol) .Marland Kitchen.. 1po once daily 10)  Lamotrigine 200 Mg Tabs (Lamotrigine) .Marland Kitchen.. 1po at bedtime 11)  Wellbutrin Xl 150 Mg Xr24h-tab (Bupropion hcl) .Marland Kitchen.. 1 by mouth once daily 12)  Abilify 30 Mg Tabs (Aripiprazole) .Marland Kitchen.. 1 by mouth once daily 13)  Sertraline Hcl 100 Mg Tabs (Sertraline hcl) .Marland Kitchen.. 1po once daily 14)  Oxycodone Hcl 5 Mg Tabs (Oxycodone hcl) .Marland Kitchen.. 1-2 by mouth q 6 hrs as needed pain 15)  Prednisone 10 Mg Tabs (Prednisone) .... 4po qd for 3days, then 3po qd for 3days, then 2po qd for 3days, then 1po qd for 3 days, then stop  Other Orders: Admin 1st Vaccine (98119) Flu Vaccine 43yrs + (14782)  Patient Instructions: 1)  you had the steroid shot today 2)  Please take all new medications as prescribed - the pain medicine, and prednisone 3)  the pain and swelling should start to improve in 36-48hrs 4)  Continue all previous medications as before this visit  5)  Please schedule a follow-up appointment in Mar 2012 with CPX labs and: 6)  uric acid:  719.47 7)  you had the flu shot today Prescriptions: PREDNISONE 10 MG TABS (PREDNISONE) 4po qd for 3days, then 3po qd for 3days, then 2po qd for 3days, then 1po qd for 3 days, then stop  #30 x 0   Entered and Authorized by:   Corwin Levins MD   Signed by:   Corwin Levins MD on 02/21/2010   Method used:   Print then Give to Patient   RxID:   [id number] OXYCODONE HCL 5 MG TABS (OXYCODONE HCL) 1-2 by mouth q 6 hrs as needed pain  #50 x 0   Entered and Authorized by:   Corwin Levins MD   Signed by:   Corwin Levins MD on 02/21/2010   Method used:  Print then Give to Patient   RxID:   [id number]   Flu Vaccine Consent Questions     Do you have a history of severe allergic reactions to this vaccine? no    Any prior history of allergic reactions to egg and/or gelatin? no    Do you have a sensitivity to  the preservative Thimersol? no    Do you have a past history of Guillan-Barre Syndrome? no    Do you currently have an acute febrile illness? no    Have you ever had a severe reaction to latex? no    Vaccine information given and explained to patient? yes    Are you currently pregnant? no    Lot Number:[id number]   Exp Date:12/15/2010   Site Given  Left Deltoid IMbflu   Medication Administration  Injection # 1:    Medication: Depo- Medrol 40mg     Diagnosis: ANKLE PAIN, RIGHT (ICD-719.47)    Route: IM    Site: LUOQ gluteus    Exp Date: 09/2012    Lot #: [id number]    Mfr: [employer]    Comments: Patient receieved 120mg  Depo-medrol    Patient tolerated injection without complications    Given by: Zella Ball Ewing CMA Duncan Dull) (February 21, 2010 5:04 PM)  Injection # 2:    Medication: Depo- Medrol 80mg     Diagnosis: ANKLE PAIN, RIGHT (ICD-719.47)    Route: IM    Site: LUOQ gluteus    Exp Date: 09/2012    Lot #: [id number]    Mfr: [employer]    Given by: Zella Ball Ewing CMA Duncan Dull) (February 21, 2010 5:04 PM)  Orders Added: 1)  Admin 1st Vaccine [90471] 2)  Flu Vaccine 57yrs + [14782] 3)  Depo- Medrol 40mg  [J1030] 4)  Depo- Medrol 80mg  [J1040] 5)  Admin of Therapeutic Inj  intramuscular or subcutaneous [96372] 6)  Est. Patient Level III [95621]

## 2010-07-19 NOTE — Letter (Signed)
Summary: Generic Letter  [facility]  [address] [place], [place] [address]   Phone: [phone]  Fax: [phone]    10/18/2009  [facility] [address] [place], [place]  [address]  Dear [name],  We have called your phone number several days and states it is an invalid phone number. Dr. Jonny Ruiz wanted to inform you it is time to followup on a Chest CT, he has ordered the test. You will need to call our office to schedule labs that should be done prior to your CT. Please call our office with questions.         Sincerely,   Robin Ewing

## 2010-07-19 NOTE — Assessment & Plan Note (Signed)
Summary: b12 shot/[name]/pt coming at 9am/cd   Nurse Visit   Allergies: 1)  ! Darvocet 2)  ! * Oxycontin 3)  * Chantix 4)  Nsaids 5)  * Tramadol  Medication Administration  Injection # 1:    Medication: Vit B12 1000 mcg    Diagnosis: VITAMIN B12 DEFICIENCY (ICD-266.2)    Route: IM    Site: L deltoid    Exp Date: 01/16/2012    Lot #: [id number]    Mfr: [employer]    Patient tolerated injection without complications    Given by: Margaret Pyle, CMA (May 24, 2010 10:19 AM)  Orders Added: 1)  Vit B12 1000 mcg [J3420] 2)  Admin of Therapeutic Inj  intramuscular or subcutaneous [[address]]

## 2010-07-19 NOTE — Progress Notes (Signed)
Summary: Pain but not everyday   Phone Note Call from Patient Call back at Home Phone [phone]   Call For: DR PATTERSON Summary of Call: Lambert Mody breath taking pains in her belly. Doesnt happen everyday has been going on for 2wks now. Initial call taken by: Leanor Kail [facility],  January 01, 2010 11:49 AM  Follow-up for Phone Call        Pt states she is having sharp pain in center of abd.  More to the left side.  Pain can last all day but does not occur every day.  Taking protonix two times a day.  Any suggestions? Follow-up by: Ashok Cordia RN,  January 01, 2010 12:17 PM  Additional Follow-up for Phone Call Additional follow up Details #1::        chronic pain syndrome..can refer to pain clinic if she wants...otherwise i care issue... Additional Follow-up by: Mardella Layman MD FACG,  January 01, 2010 12:50 PM    Additional Follow-up for Phone Call Additional follow up Details #2::    Pt notified.  She is willing to go to pain clinic.  Perfers to go to [facility].  Records sent to [facility] [place].   Follow-up by: Ashok Cordia RN,  January 01, 2010 3:22 PM   Appended Document: Pain but not everyday Received fax from [facility].  They have reviewed pt's records and do not feel they have anything to offer patient.  They will not be able to see her at this time.

## 2010-07-19 NOTE — Progress Notes (Signed)
Summary: PAIN   Phone Note Call from Patient   Summary of Call: Pt called stating that pain pills are not helping arm. pt would like to know if theres anything else she can take? Please advise? Initial call taken by: [name],  Oct 27, 2009 9:44 AM  Follow-up for Phone Call        I have nothing else to offer since she is allergic to all other pain meds except for morphine, which I would not feel comfortable to use at this time Follow-up by: Corwin Levins MD,  Oct 27, 2009 4:38 PM  Additional Follow-up for Phone Call Additional follow up Details #1::        Pt informed, advised f/u w/continued problems Additional Follow-up by: Lamar Sprinkles, CMA,  Oct 27, 2009 5:19 PM     Appended Document: PAIN  robin - to call pt to inform - we received her nerve test results, and she does seem to have nerve pinch on the right arm at the elbow -       1)  refer neurosurgury  robin - to call pt to inform, and arrange any above - I will do referral  Appended Document: PAIN  called pt informed of above information

## 2010-07-19 NOTE — Miscellaneous (Signed)
Summary: Orders Update   Clinical Lists Changes  Orders: Added new Referral order of Radiology Referral (Radiology) - Signed  Appended Document: Orders Update robin - to call pt to inform, that I went ahead and ordered the f/u Ct chest with CM since it is due  she will also need prior:    bun/cr:   v58.69  Appended Document: Orders Update called pt could not leave a msg, will call back  Appended Document: Orders Update called pt could not leave msg, will call back  Appended Document: Orders Update called pt could not leave a mst as invalid phone number.  Appended Document: Orders Update sent pt a letter and informed of above information. Informed pt to call and sch labs that are due prior to Chest CT.

## 2010-07-19 NOTE — Progress Notes (Signed)
  Phone Note Refill Request  on Oct 24, 2009 1:34 PM  Refills Requested: Medication #1:  CRESTOR 20 MG  TABS 1 by mouth once daily   Dosage confirmed as above?Dosage Confirmed   Notes: [facility] [place] Initial call taken by: Scharlene Gloss,  Oct 24, 2009 1:34 PM    Prescriptions: CRESTOR 20 MG  TABS (ROSUVASTATIN CALCIUM) 1 by mouth once daily  #30 Tablet x 10   Entered by:   Scharlene Gloss   Authorized by:   Corwin Levins MD   Signed by:   Scharlene Gloss on 10/24/2009   Method used:   Faxed to ...       [facility] [phone]* (retail)       [address] [place], [place]  [address]       Ph: [phone]       Fax: [phone]   RxID:   [id number]

## 2010-07-19 NOTE — Assessment & Plan Note (Signed)
Summary: ELBOW PAIN/ NWS   Vital Signs:  Patient profile:   55 year old female Height:      65 inches Weight:      148.75 pounds BMI:     24.84 O2 Sat:      97 % on Room air Temp:     97.6 degrees F oral Pulse rate:   96 / minute BP sitting:   112 / 72  (left arm) Cuff size:   regular  Vitals Entered ByZella Ball Ewing (September 27, 2009 9:25 AM)  O2 Flow:  Room air  CC: Right elbow pain/RE   Primary Care Provider:  Oliver Barre, MD  CC:  Right elbow pain/RE.  History of Present Illness: the last of her 3 sisters died suddenly last wk, also moved to a new apt last wk, but pain has been chornic persistent , constant achng severe toothache like for 9 to 12 wks;  s/p tedonitis surgury to right elbow approx 8 yrs ago but pain seems different this time;  no pain above the elbow; but does hurt more to flex and extend fully the distal and, and radiate into the hand; assoc  with weakness  but no numbenss, worse to wlak the dog or evn pick up a plate;  heat, ice, alleve and toc DJD creams all no help;  No prior hx of nerve extrmeity problem.  No bruise, red, swelling , trauma or other injury.  No shoulder, upper arm or significant neck issues.  No fever, wt loss, night sweats.   Left handed.    Also mention mental health seems to be worsening again wtih bipolar symtpoms, with incresaed anxiety and depressve symtpoms adn mood swings;  was doing rather well on the lamcital most recently and then stopped it , as she has in the past, and now realizes again she needs to re-start the med;  was hoping to be able to do that today, instead of a separate appt with psychiatry just to re-start the med;  does plan to f/u as she has done regularly in the past  Problems Prior to Update: 1)  Vitamin D Deficiency  (ICD-268.9) 2)  Arm Pain, Right  (ICD-729.5) 3)  Preventive Health Care  (ICD-V70.0) 4)  Abdominal Pain-epigastric  (ICD-789.06) 5)  Back Pain  (ICD-724.5) 6)  Chronic Migraine w/o Aura W/intractable  w/o Sm  (ICD-346.71) 7)  Leukocytosis  (ICD-288.60) 8)  Knee Pain, Left  (ICD-719.46) 9)  Peripheral Edema  (ICD-782.3) 10)  Intermittent Vertigo  (ICD-780.4) 11)  Uri  (ICD-465.9) 12)  Pulmonary Nodule  (ICD-518.89) 13)  Dyspnea On Exertion  (ICD-786.09) 14)  COPD  (ICD-496) 15)  Gastroparesis  (ICD-536.3) 16)  Gastritis  (ICD-535.50) 17)  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) 18)  Vitamin B12 Deficiency  (ICD-266.2) 19)  Bipolar I D/o Most Recent Epis Depressed Mild  (ICD-296.51) 20)  Abdominal Pain-generalized  (ICD-789.07) 21)  Fibromyalgia  (ICD-729.1) 22)  Swelling, Mass, or Lump in Chest  (ICD-786.6) 23)  Diarrhea  (ICD-787.91) 24)  Abdominal Pain, Lower  (ICD-789.09) 25)  Weight Loss  (ICD-783.21) 26)  Preventive Health Care  (ICD-V70.0) 27)  Back Pain  (ICD-724.5) 28)  Conjunctivitis  (ICD-372.30) 29)  Sinusitis- Acute-nos  (ICD-461.9) 30)  Hip Pain, Left  (ICD-719.45) 31)  Back Pain  (ICD-724.5) 32)  Allergic Rhinitis  (ICD-477.9) 33)  Ibs  (ICD-564.1) 34)  Gerd  (ICD-530.81) 35)  Hyperlipidemia  (ICD-272.4) 36)  Peripheral Neuropathy  (ICD-356.9) 37)  Colonic Polyps, Hx of  (  ICD-V12.72) 38)  Anxiety  (ICD-300.00) 39)  Abdominal Pain, Generalized  (ICD-789.07) 40)  Depression  (ICD-311)  Medications Prior to Update: 1)  Klor-Con 10 10 Meq Tbcr (Potassium Chloride) .Marland Kitchen.. 1po Qd 2)  Crestor 20 Mg  Tabs (Rosuvastatin Calcium) .Marland Kitchen.. 1 By Mouth Once Daily 3)  Pantoprazole Sodium 40 Mg Tbec (Pantoprazole Sodium) .... Take Two Times A Day 4)  Savella 50 Mg Tabs (Milnacipran Hcl) .Marland Kitchen.. 1 By Mouth Two Times A Day 5)  Aspirin 81 Mg Tbec (Aspirin) .... Once Daily 6)  Cyanocobalamin 1000 Mcg/ml Soln (Cyanocobalamin) .... Inject One Ml Im Once A Month 7)  Trazodone Hcl 100 Mg Tabs (Trazodone Hcl) .Marland Kitchen.. 1p O At Bedtime 8)  Cephalexin 500 Mg Caps (Cephalexin) .Marland Kitchen.. 1po Three Times A Day  Current Medications (verified): 1)  Klor-Con 10 10 Meq Tbcr (Potassium Chloride) .Marland Kitchen.. 1po Qd 2)   Crestor 20 Mg  Tabs (Rosuvastatin Calcium) .Marland Kitchen.. 1 By Mouth Once Daily 3)  Pantoprazole Sodium 40 Mg Tbec (Pantoprazole Sodium) .... Take Two Times A Day 4)  Savella 50 Mg Tabs (Milnacipran Hcl) .Marland Kitchen.. 1 By Mouth Two Times A Day 5)  Aspirin 81 Mg Tbec (Aspirin) .... Once Daily 6)  Cyanocobalamin 1000 Mcg/ml Soln (Cyanocobalamin) .... Inject One Ml Im Once A Month 7)  Trazodone Hcl 100 Mg Tabs (Trazodone Hcl) .Marland Kitchen.. 1p O At Bedtime 8)  Tramadol Hcl 50 Mg Tabs (Tramadol Hcl) .Marland Kitchen.. 1 - 2 By Mouth Q 6 Hrs As Needed 9)  Vitamin D3 1000 Unit Tabs (Cholecalciferol) .Marland Kitchen.. 1po Once Daily 10)  Lamotrigine 100 Mg Tabs (Lamotrigine) .Marland Kitchen.. 1 By Mouth Once Daily  Allergies (verified): 1)  ! Darvocet 2)  ! * Oxycontin 3)  * Chantix 4)  Nsaids 5)  * Tramadol  Past History:  Past Medical History: Last updated: 09/21/2008 Depression/Bipolar OCD/anxiety migraines brain aneurysm chronic abd pain Colonic polyps, hx of Peripheral neuropathy Fibromyalgia Hyperlipidemia GERD chronic constipation/IBS Allergic rhinitis Gastroparesis COPD  Past Surgical History: Last updated: 09/19/2008 Appendectomy Cholecystectomy Hysterectomy Oophorectomy s/p coil to brain anueyrsm disc surgury Shoulder sugery Elbow surgery  Social History: Last updated: 08/16/2008 Current Smoker  6 cig per day Alcohol use-no Daily Caffeine Use coffee in the AM Illicit Drug Use - no Widow does not work  Risk Factors: Smoking Status: current (07/20/2007)  Review of Systems       all otherwise negative per pt -    Physical Exam  General:  alert and well-developed.   Head:  normocephalic and atraumatic.   Eyes:  vision grossly intact, pupils equal, and pupils round.   Ears:  R ear normal and L ear normal.   Nose:  no external deformity and no nasal discharge.   Mouth:  no gingival abnormalities and pharynx pink and moist.   Neck:  supple and no masses.   Lungs:  normal respiratory effort and normal breath sounds.     Heart:  normal rate and regular rhythm.   Msk:  normal ROM, no joint tenderness, and no joint swelling.   Neurologic:  mild decreased sensation to LT to right fingertips, ? decreased grip on the right, o/w strenght and senstation and DTR's intact Psych:  depressed affect and moderately anxious.     Impression & Recommendations:  Problem # 1:  ARM PAIN, RIGHT (ICD-729.5)  suspect ulnar neuritis/ compression - for EMG/.NCS to further assess, will likely need referral to NS, Continue all previous medications as before this visit   Orders: Misc. Referral (Misc. Ref)  Problem # 2:  BIPOLAR I D/O MOST RECENT EPIS DEPRESSED MILD (ICD-296.51) pt asks to re-start the lamictal, and plans to f/u psychiatry  Problem # 3:  VITAMIN D DEFICIENCY (ICD-268.9) to start vit d 1000 units per day, recent labs reviewed with pt  Complete Medication List: 1)  Klor-con 10 10 Meq Tbcr (Potassium chloride) .Marland Kitchen.. 1po qd 2)  Crestor 20 Mg Tabs (Rosuvastatin calcium) .Marland Kitchen.. 1 by mouth once daily 3)  Pantoprazole Sodium 40 Mg Tbec (Pantoprazole sodium) .... Take two times a day 4)  Savella 50 Mg Tabs (Milnacipran hcl) .Marland Kitchen.. 1 by mouth two times a day 5)  Aspirin 81 Mg Tbec (Aspirin) .... Once daily 6)  Cyanocobalamin 1000 Mcg/ml Soln (Cyanocobalamin) .... Inject one ml im once a month 7)  Trazodone Hcl 100 Mg Tabs (Trazodone hcl) .Marland Kitchen.. 1p o at bedtime 8)  Tramadol Hcl 50 Mg Tabs (Tramadol hcl) .Marland Kitchen.. 1 - 2 by mouth q 6 hrs as needed 9)  Vitamin D3 1000 Unit Tabs (Cholecalciferol) .Marland Kitchen.. 1po once daily 10)  Lamotrigine 100 Mg Tabs (Lamotrigine) .Marland Kitchen.. 1 by mouth once daily  Patient Instructions: 1)  Please take all new medications as prescribed - the pain medication, lamictal, and vit D 1000 units per day (OTC)  2)  please make your regular followup appt with your psychiatrist 3)  You will be contacted about the referral(s) to: nerve test for the right arm (and you may need referral to Neurosurgury depending on the  results of the nerve test) 4)  Please schedule a follow-up appointment in March 2012  for yearly exam, or sooner if needed Prescriptions: LAMOTRIGINE 100 MG TABS (LAMOTRIGINE) 1 by mouth once daily  #30 x 5   Entered and Authorized by:   Corwin Levins MD   Signed by:   Corwin Levins MD on 09/27/2009   Method used:   Print then Give to Patient   RxID:   [id number] LAMOTRIGINE 25 MG TABS (LAMOTRIGINE) 1 by mouth once daily for 2 weeks, then 2 by mouth once daily for 2 wks  #45 x 0   Entered and Authorized by:   Corwin Levins MD   Signed by:   Corwin Levins MD on 09/27/2009   Method used:   Print then Give to Patient   RxID:   [id number] TRAMADOL HCL 50 MG TABS (TRAMADOL HCL) 1 - 2 by mouth q 6 hrs as needed  #100 x 1   Entered and Authorized by:   Corwin Levins MD   Signed by:   Corwin Levins MD on 09/27/2009   Method used:   Print then Give to Patient   RxID:   [id number]

## 2010-07-19 NOTE — Consult Note (Signed)
Summary: Referral Denied/[facility]  Referral Denied/[facility]   Imported By: Sherian Rein 01/17/2010 09:20:52  _____________________________________________________________________  External Attachment:    Type:   Image     Comment:   External Document

## 2010-07-19 NOTE — Assessment & Plan Note (Signed)
Summary: MONTHLY B12/266.2/SP  Nurse Visit   Allergies: 1)  ! Darvocet 2)  ! * Oxycontin 3)  * Chantix 4)  Nsaids 5)  * Tramadol  Medication Administration  Injection # 1:    Medication: Vit B12 1000 mcg    Diagnosis: VITAMIN D DEFICIENCY (ICD-268.9)    Route: IM    Site: R deltoid    Exp Date: 05/2011    Lot #: [id number]    Mfr: American Regent    Patient tolerated injection without complications    Given by: Merri Ray CMA (AAMA) (Oct 24, 2009 11:48 AM)  Orders Added: 1)  Vit B12 1000 mcg [J3420]

## 2010-07-19 NOTE — Letter (Signed)
Summary: Discharge Letter  [facility]  [address] [place], [place] [address]   Phone: [phone]  Fax: [phone]       04/10/2010 MRN: [id number]  [facility] [address] [place], [place]  [address]  Dear Ms. Rance,   I find it necessary to inform you that I will not be able to provide medical care to you, because of noncompliance in regards to her chronic abdominal pain syndrome. Since your condition requires medical attention, I suggest that you place yourself under the care of another physician without delay. If you desire, I will be available for emergency care for 30 days after you receive this letter.  This should give you ample time to select a physician of your choice from the many competent providers in this area. You may want to call the local medical society or [facility]'s physician referral service [phone]) for their assistance in locating a new physician. With your written authorization, I will make a copy of your medical record available to your new physician.   Sincerely,    Sheryn Bison MD [facility]  Appended Document: Discharge Letter printed and sent down to medical records. and Dr. Jonny Ruiz

## 2010-07-19 NOTE — Assessment & Plan Note (Signed)
Summary: monthly b12...as.  Nurse Visit   Allergies: 1)  ! Darvocet 2)  ! * Oxycontin 3)  * Chantix 4)  Nsaids 5)  * Tramadol  Medication Administration  Injection # 1:    Medication: Vit B12 1000 mcg    Diagnosis: VITAMIN B12 DEFICIENCY (ICD-266.2)    Route: IM    Site: R deltoid    Exp Date: 07/19/2011    Lot #: [id number]    Mfr: American Regent    Patient tolerated injection without complications    Given by: Lowry Ram NCMA (September 27, 2009 9:10 AM)  Orders Added: 1)  Vit B12 1000 mcg [J3420]

## 2010-07-23 ENCOUNTER — Other Ambulatory Visit: Payer: Self-pay | Admitting: Internal Medicine

## 2010-07-23 DIAGNOSIS — R911 Solitary pulmonary nodule: Secondary | ICD-10-CM

## 2010-07-25 ENCOUNTER — Other Ambulatory Visit: Payer: Self-pay

## 2010-07-25 NOTE — Progress Notes (Signed)
----   Converted from flag ---- ---- 07/18/2010 9:12 AM, Corwin Levins MD wrote: please call to inform pt that I went ahead and ordered the followup CT chest to make sure the right mid lung "spot" found may 2011 is still likely benign  then convert to phone note   ---- 10/24/2009 5:13 PM, Corwin Levins MD wrote: needs repeat CT at 9 months from may 2011 ------------------------------  Called patient informed of followup Chest CT.

## 2010-07-25 NOTE — Miscellaneous (Signed)
Summary: Orders Update   Clinical Lists Changes  Orders: Added new Referral order of Radiology Referral (Radiology) - Signed 

## 2010-07-27 ENCOUNTER — Other Ambulatory Visit: Payer: Self-pay

## 2010-08-01 ENCOUNTER — Ambulatory Visit (INDEPENDENT_AMBULATORY_CARE_PROVIDER_SITE_OTHER)
Admission: RE | Admit: 2010-08-01 | Discharge: 2010-08-01 | Disposition: A | Discharge status: Home / Self Care | Payer: Medicare Other | Source: Ambulatory Visit | Attending: Internal Medicine | Admitting: Internal Medicine

## 2010-08-01 DIAGNOSIS — J984 Other disorders of lung: Secondary | ICD-10-CM

## 2010-08-01 DIAGNOSIS — R911 Solitary pulmonary nodule: Secondary | ICD-10-CM

## 2010-08-23 NOTE — Letter (Signed)
Summary: Dismissal Activation Form, Return Reciept  Dismissal Activation Form, Return Reciept   Imported By: Maryln Gottron 08/17/2010 15:25:50  _____________________________________________________________________  External Attachment:    Type:   Image     Comment:   External Document

## 2010-08-27 LAB — COMPREHENSIVE METABOLIC PANEL
ALT: 17 U/L (ref 0–35)
AST: 17 U/L (ref 0–37)
Albumin: 4.1 g/dL (ref 3.5–5.2)
CO2: 27 mEq/L (ref 19–32)
Calcium: 9.9 mg/dL (ref 8.4–10.5)
Creatinine, Ser: 0.99 mg/dL (ref 0.4–1.2)
GFR calc non Af Amer: 59 mL/min — ABNORMAL LOW (ref >60)
Potassium: 4.3 mEq/L (ref 3.5–5.1)
Total Protein: 6.4 g/dL (ref 6.0–8.3)

## 2010-08-27 LAB — PROTIME-INR
INR: 0.94 (ref 0.00–1.49)
Prothrombin Time: 12.8 seconds (ref 11.6–15.2)

## 2010-08-27 LAB — SURGICAL PCR SCREEN
MRSA, PCR: NEGATIVE
Staphylococcus aureus: NEGATIVE

## 2010-08-27 LAB — CBC
HCT: 45.7 % (ref 36.0–46.0)
Hemoglobin: 15.2 g/dL — ABNORMAL HIGH (ref 12.0–15.0)
MCV: 96.6 fL (ref 78.0–100.0)
RBC: 4.73 MIL/uL (ref 3.87–5.11)
WBC: 14.5 10*3/uL — ABNORMAL HIGH (ref 4.0–10.5)

## 2010-08-27 LAB — DIFFERENTIAL
Eosinophils Relative: 1 % (ref 0–5)
Lymphocytes Relative: 12 % (ref 12–46)
Lymphs Abs: 1.7 10*3/uL (ref 0.7–4.0)
Monocytes Relative: 7 % (ref 3–12)

## 2010-08-28 ENCOUNTER — Other Ambulatory Visit: Payer: Medicare Other

## 2010-08-28 LAB — CBC
Platelets: 239 10*3/uL (ref 150–400)
RBC: 4.52 MIL/uL (ref 3.87–5.11)
RDW: 13.7 % (ref 11.5–15.5)
WBC: 9.4 10*3/uL (ref 4.0–10.5)

## 2010-08-28 LAB — BASIC METABOLIC PANEL
BUN: 10 mg/dL (ref 6–23)
Creatinine, Ser: 0.99 mg/dL (ref 0.4–1.2)
GFR calc Af Amer: >60 mL/min (ref >60)
GFR calc non Af Amer: 59 mL/min — ABNORMAL LOW (ref >60)
Potassium: 4.4 mEq/L (ref 3.5–5.1)

## 2010-08-28 LAB — APTT: aPTT: 28 seconds (ref 24–37)

## 2010-08-28 LAB — PROTIME-INR: INR: 0.88 (ref 0.00–1.49)

## 2010-08-29 ENCOUNTER — Other Ambulatory Visit: Payer: Self-pay | Admitting: Internal Medicine

## 2010-08-29 ENCOUNTER — Encounter (INDEPENDENT_AMBULATORY_CARE_PROVIDER_SITE_OTHER): Payer: Self-pay | Admitting: *Deleted

## 2010-08-29 ENCOUNTER — Other Ambulatory Visit: Payer: Medicare Other

## 2010-08-29 DIAGNOSIS — Z0000 Encounter for general adult medical examination without abnormal findings: Secondary | ICD-10-CM

## 2010-08-29 DIAGNOSIS — M25579 Pain in unspecified ankle and joints of unspecified foot: Secondary | ICD-10-CM

## 2010-08-29 DIAGNOSIS — E785 Hyperlipidemia, unspecified: Secondary | ICD-10-CM

## 2010-08-29 LAB — URINALYSIS, ROUTINE W REFLEX MICROSCOPIC
Urine Glucose: NEGATIVE
Urobilinogen, UA: 0.2 (ref 0.0–1.0)

## 2010-08-29 LAB — HEPATIC FUNCTION PANEL
ALT: 16 U/L (ref 0–35)
Alkaline Phosphatase: 71 U/L (ref 39–117)
Bilirubin, Direct: 0.1 mg/dL (ref 0.0–0.3)
Total Protein: 6.3 g/dL (ref 6.0–8.3)

## 2010-08-29 LAB — LIPID PANEL
LDL Cholesterol: 45 mg/dL (ref 0–99)
Total CHOL/HDL Ratio: 2
Triglycerides: 70 mg/dL (ref 0.0–149.0)

## 2010-08-29 LAB — CBC WITH DIFFERENTIAL/PLATELET
Basophils Absolute: 0 10*3/uL (ref 0.0–0.1)
Lymphocytes Relative: 22.8 % (ref 12.0–46.0)
Monocytes Relative: 8.2 % (ref 3.0–12.0)
Neutrophils Relative %: 65.5 % (ref 43.0–77.0)
Platelets: 197 10*3/uL (ref 150.0–400.0)
RDW: 13.9 % (ref 11.5–14.6)

## 2010-08-29 LAB — BASIC METABOLIC PANEL
CO2: 27 mEq/L (ref 19–32)
Calcium: 9.2 mg/dL (ref 8.4–10.5)
Chloride: 105 mEq/L (ref 96–112)
Potassium: 4.5 mEq/L (ref 3.5–5.1)
Sodium: 139 mEq/L (ref 135–145)

## 2010-08-31 ENCOUNTER — Encounter: Payer: Self-pay | Admitting: Internal Medicine

## 2010-08-31 ENCOUNTER — Ambulatory Visit (INDEPENDENT_AMBULATORY_CARE_PROVIDER_SITE_OTHER): Payer: Medicare Other | Admitting: Internal Medicine

## 2010-08-31 DIAGNOSIS — Z0000 Encounter for general adult medical examination without abnormal findings: Secondary | ICD-10-CM

## 2010-08-31 DIAGNOSIS — E538 Deficiency of other specified B group vitamins: Secondary | ICD-10-CM

## 2010-08-31 DIAGNOSIS — G4700 Insomnia, unspecified: Secondary | ICD-10-CM | POA: Insufficient documentation

## 2010-09-04 NOTE — Assessment & Plan Note (Signed)
Summary: 6 MO FOL/ LABS PRIOR /NWS   Vital Signs:  Patient profile:   55 year old female Height:      65 inches Weight:      164.25 pounds BMI:     27.43 O2 Sat:      98 % on Room air Temp:     98.3 degrees F oral Pulse rate:   79 / minute BP sitting:   110 / 70  (left arm) Cuff size:   regular  Vitals Entered By: Zella Ball Ewing CMA Duncan Dull) (August 31, 2010 10:09 AM)  O2 Flow:  Room air  Preventive Care Screening  Mammogram:    Date:  09/15/2009    Results:  normal   CC: 6 month ROV/RE   Primary Care Provider:  Oliver Barre, MD  CC:  6 month ROV/RE.  History of Present Illness: here for wellness and f/u;  overall doing ok - Pt denies CP, worsening sob, doe, wheezing, orthopnea, pnd, worsening LE edema, palps, dizziness or syncope  Pt denies new neuro symptoms such as headache, facial or extremity weakness  Pt denies polydipsia, polyuria.  Overall good compliance with meds, trying to follow low chol diet, wt stable, little excercise however  No fever, wt loss, night sweats, loss of appetite or other constitutional symptoms  Overall good compliance with meds, and good tolerability.  .Denies worsening depressive symptoms, suicidal ideation, or panic.   Pt states good ability with ADL's, low fall risk, home safety reviewed and adequate, no significant change in hearing or vision, trying to follow lower chol diet, and occasionally active only with regular excercise. Lyrica not working at current dose with uncontrolled pain diffusely.  has ongoing abd pain, due for colonoscopy.   Has ongoing sleep diffictuly with getting to sleep.    Preventive Screening-Counseling & Management      Drug Use:  no.    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  (ICD-346.71) 10)  Leukocytosis  (ICD-288.60) 11)  Knee Pain, Left  (ICD-719.46) 12)  Peripheral Edema  (ICD-782.3) 13)  Intermittent Vertigo  (ICD-780.4) 14)  Uri  (ICD-465.9) 15)  Pulmonary Nodule  (ICD-518.89) 16)  Dyspnea On Exertion  (ICD-786.09) 17)  COPD  (ICD-496) 18)  Gastroparesis  (ICD-536.3) 19)  Gastritis  (ICD-535.50) 20)  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) 21)  Vitamin B12 Deficiency  (ICD-266.2) 22)  Bipolar I D/o Most Recent Epis Depressed Mild  (ICD-296.51) 23)  Abdominal Pain-generalized  (ICD-789.07) 24)  Fibromyalgia  (ICD-729.1) 25)  Swelling, Mass, or Lump in Chest  (ICD-786.6) 26)  Diarrhea  (ICD-787.91) 27)  Abdominal Pain, Lower  (ICD-789.09) 28)  Weight Loss  (ICD-783.21) 29)  Preventive Health Care  (ICD-V70.0) 30)  Back Pain  (ICD-724.5) 31)  Conjunctivitis  (ICD-372.30) 32)  Sinusitis- Acute-nos  (ICD-461.9) 33)  Hip Pain, Left  (ICD-719.45) 34)  Back Pain  (ICD-724.5) 35)  Allergic Rhinitis  (ICD-477.9) 36)  Ibs  (ICD-564.1) 37)  Gerd  (ICD-530.81) 38)  Hyperlipidemia  (ICD-272.4) 39)  Peripheral Neuropathy  (ICD-356.9) 40)  Colonic Polyps, Hx of  (ICD-V12.72) 41)  Anxiety  (ICD-300.00) 42)  Abdominal Pain, Generalized  (ICD-789.07) 43)  Depression  (ICD-311)  Medications Prior to Update: 1)  Klor-Con 10 10 Meq Tbcr (Potassium Chloride) .Marland Kitchen.. 1po Qd 2)  Crestor 20 Mg  Tabs (Rosuvastatin Calcium) .Marland Kitchen.. 1 By Mouth Once Daily 3)  Pantoprazole Sodium 40 Mg Tbec (Pantoprazole Sodium) .... Take Two Times A Day 4)  Lyrica 75 Mg Caps (Pregabalin) .Marland Kitchen.. 1 By Mouth Two Times A Day 5)  Aspirin 81 Mg Tbec (Aspirin) .... Once Daily 6)  Cyanocobalamin 1000 Mcg/ml Soln (Cyanocobalamin) .... Inject One Ml Im Once A Month 7)  Trazodone Hcl 100 Mg Tabs (Trazodone Hcl) .Marland Kitchen.. 1 By Mouth At Bedtime 8)  Tramadol Hcl 50 Mg Tabs (Tramadol Hcl) .Marland Kitchen.. 1 - 2 By Mouth Q 6 Hrs As  Needed 9)  Vitamin D3 1000 Unit Tabs (Cholecalciferol) .Marland Kitchen.. 1po Once Daily 10)  Lamotrigine 200 Mg Tabs (Lamotrigine) .Marland Kitchen.. 1po At Bedtime 11)  Wellbutrin Xl 150 Mg Xr24h-Tab (Bupropion Hcl) .Marland Kitchen.. 1 By Mouth Once Daily 12)  Abilify 30 Mg Tabs (Aripiprazole) .Marland Kitchen.. 1 By Mouth Once Daily 13)  Sertraline Hcl 100 Mg Tabs (Sertraline Hcl) .Marland Kitchen.. 1po Once Daily 14)  Oxycodone Hcl 5 Mg Tabs (Oxycodone Hcl) .Marland Kitchen.. 1-2 By Mouth Q 6 Hrs As Needed Pain 15)  Prednisone 10 Mg Tabs (Prednisone) .... 4po Qd For 3days, Then 3po Qd For 3days, Then 2po Qd For 3days, Then 1po Qd For 3 Days, Then Stop  Current Medications (verified): 1)  Klor-Con 10 10 Meq Tbcr (Potassium Chloride) .Marland Kitchen.. 1po Qd 2)  Crestor 20 Mg  Tabs (Rosuvastatin Calcium) .Marland Kitchen.. 1 By Mouth Once Daily 3)  Pantoprazole Sodium 40 Mg Tbec (Pantoprazole Sodium) .... Take Two Times A Day 4)  Lyrica 100 Mg Caps (Pregabalin) .Marland Kitchen.. 1 By Mouth Two Times A Day 5)  Aspirin 81 Mg Tbec (Aspirin) .... Once Daily 6)  Cyanocobalamin 1000 Mcg/ml Soln (Cyanocobalamin) .... Inject One Ml Im Once A Month 7)  Zolpidem Tartrate 10 Mg Tabs (Zolpidem Tartrate) .Marland Kitchen.. 1 By Mouth At Bedtime As Needed 8)  Tramadol Hcl 50 Mg Tabs (Tramadol Hcl) .Marland Kitchen.. 1 - 2 By Mouth Q 6 Hrs As Needed 9)  Vitamin D3 1000 Unit Tabs (Cholecalciferol) .Marland Kitchen.. 1po Once Daily 10)  Lamotrigine 200 Mg Tabs (Lamotrigine) .Marland Kitchen.. 1po At Bedtime 11)  Wellbutrin Xl 150 Mg Xr24h-Tab (Bupropion Hcl) .Marland Kitchen.. 1 By Mouth Once Daily 12)  Abilify 30 Mg Tabs (Aripiprazole) .Marland Kitchen.. 1 By Mouth Once Daily 13)  Sertraline Hcl 100 Mg Tabs (Sertraline Hcl) .Marland Kitchen.. 1po Once Daily 14)  Oxycodone Hcl 5 Mg Tabs (Oxycodone Hcl) .Marland Kitchen.. 1-2 By Mouth Q 6 Hrs As Needed Pain 15)  Prednisone 10 Mg Tabs (Prednisone) .... 4po Qd For 3days, Then 3po Qd For 3days, Then 2po Qd For 3days, Then 1po Qd For 3 Days, Then Stop  Allergies (verified): 1)  ! Darvocet 2)  ! * Oxycontin 3)  * Chantix 4)  Nsaids 5)  * Tramadol  Past History:  Past Medical  History: Last updated: 09/21/2008 Depression/Bipolar OCD/anxiety migraines brain aneurysm chronic abd pain Colonic polyps, hx of Peripheral neuropathy Fibromyalgia Hyperlipidemia GERD chronic constipation/IBS Allergic rhinitis Gastroparesis COPD  Past Surgical History: Last updated: 09/19/2008 Appendectomy Cholecystectomy Hysterectomy Oophorectomy s/p coil to brain anueyrsm disc surgury Shoulder sugery Elbow surgery  Family History: Last updated: 09/19/2008 CAD cancer DM HTN seizure No FH of Colon Cancer: Lung cancer: Father Family History of Stomach Cancer:Sister Brain Cancer: Mother Leukemia: Sister  Social History: Last updated: 08/31/2010 Current Smoker  6 cig per day Alcohol use-no Daily Caffeine  Use coffee in the AM Illicit Drug Use - no Widow does not work Drug use-no  Risk Factors: Smoking Status: current (07/20/2007)  Social History: Current Smoker  6 cig per day Alcohol use-no Daily Caffeine Use coffee in the AM Illicit Drug Use - no Widow does not work Drug use-no  Review of Systems  The patient denies anorexia, fever, vision loss, decreased hearing, hoarseness, chest pain, syncope, dyspnea on exertion, peripheral edema, prolonged cough, headaches, hemoptysis, melena, hematochezia, severe indigestion/heartburn, hematuria, muscle weakness, suspicious skin lesions, transient blindness, difficulty walking, depression, unusual weight change, abnormal bleeding, enlarged lymph nodes, and angioedema.         all otherwise negative per pt -    Physical Exam  General:  alert and overweight-appearing.   Head:  normocephalic and atraumatic.   Eyes:  vision grossly intact, pupils equal, and pupils round.   Ears:  R ear normal and L ear normal.   Nose:  no external deformity and no nasal discharge.   Mouth:  no gingival abnormalities and pharynx pink and moist.   Neck:  supple and no masses.   Lungs:  normal respiratory effort and normal  breath sounds.   Heart:  normal rate and regular rhythm.   Abdomen:  soft, non-tender, and normal bowel sounds.   Msk:  no joint tenderness and no joint swelling.   Extremities:  no edema, no erythema  Neurologic:  strength normal in all extremities and gait normal.   Skin:  color normal and no rashes.   Psych:  not depressed appearing and slightly anxious.     Impression & Recommendations:  Problem # 1:  Preventive Health Care (ICD-V70.0) Overall doing well, age appropriate education and counseling updated, referral for preventive services and immunizations addressed, dietary counseling and smoking status adressed , most recent labs reviewed I have personally reviewed and have noted 1.The patient's medical and social history 2.Their use of alcohol, tobacco or illicit drugs 3.Their current medications and supplements 4. Functional ability including ADL's, fall risk, home safety risk, hearing & visual impairment 5.Diet and physical activities 6.Evidence for depression or mood disorders The patients weight, height, BMI  have been recorded in the chart I have made referrals, counseling and provided education to the patient based review of the above   Problem # 2:  FIBROMYALGIA (ICD-729.1)  Her updated medication list for this problem includes:    Aspirin 81 Mg Tbec (Aspirin) ..... Once daily    Tramadol Hcl 50 Mg Tabs (Tramadol hcl) .Marland Kitchen... 1 - 2 by mouth q 6 hrs as needed    Oxycodone Hcl 5 Mg Tabs (Oxycodone hcl) .Marland Kitchen... 1-2 by mouth q 6 hrs as needed pain to increase the lyrica to 100 two times a day for FMS uncontrolled pain  Problem # 3:  INSOMNIA-SLEEP DISORDER-UNSPEC (ICD-780.52)  Her updated medication list for this problem includes:    Zolpidem Tartrate 10 Mg Tabs (Zolpidem tartrate) .Marland Kitchen... 1 by mouth at bedtime as needed treat as above, f/u any worsening signs or symptoms   Discussed sleep hygiene.   Problem # 4:  ABDOMINAL PAIN-GENERALIZED (ICD-789.07)  Discussed symptom  control with the patient, refer GI as due for colonoscopy f/u  Orders: Gastroenterology Referral (GI)  Complete Medication List: 1)  Klor-con 10 10 Meq Tbcr (Potassium chloride) .Marland Kitchen.. 1po qd 2)  Crestor 20 Mg Tabs (Rosuvastatin calcium) .Marland Kitchen.. 1 by mouth once daily 3)  Pantoprazole Sodium 40 Mg Tbec (Pantoprazole sodium) .... Take two times a day 4)  Lyrica 100 Mg Caps (Pregabalin) .Marland Kitchen.. 1 by mouth two times a day 5)  Aspirin 81 Mg Tbec (Aspirin) .... Once daily 6)  Cyanocobalamin 1000 Mcg/ml Soln (Cyanocobalamin) .... Inject one ml im once a month 7)  Zolpidem Tartrate 10 Mg Tabs (Zolpidem tartrate) .Marland Kitchen.. 1 by mouth at bedtime as needed 8)  Tramadol Hcl 50 Mg Tabs (Tramadol hcl) .Marland Kitchen.. 1 - 2 by mouth q 6 hrs as needed 9)  Vitamin D3 1000 Unit Tabs (Cholecalciferol) .Marland Kitchen.. 1po once daily 10)  Lamotrigine 200 Mg Tabs (Lamotrigine) .Marland Kitchen.. 1po at bedtime 11)  Wellbutrin Xl 150 Mg Xr24h-tab (Bupropion hcl) .Marland Kitchen.. 1 by mouth once daily 12)  Abilify 30 Mg Tabs (Aripiprazole) .Marland Kitchen.. 1 by mouth once daily 13)  Sertraline Hcl 100 Mg Tabs (Sertraline hcl) .Marland Kitchen.. 1po once daily 14)  Oxycodone Hcl 5 Mg Tabs (Oxycodone hcl) .Marland Kitchen.. 1-2 by mouth q 6 hrs as needed pain 15)  Prednisone 10 Mg Tabs (Prednisone) .... 4po qd for 3days, then 3po qd for 3days, then 2po qd for 3days, then 1po qd for 3 days, then stop  Other Orders: Vit B12 1000 mcg (J3420) Admin of Therapeutic Inj  intramuscular or subcutaneous (16109)   Patient Instructions: 1)  you had the B12 shot today 2)  You will be contacted about the referral(s) to: GI 3)  your lyrica was increased to 100 mg two times a day  4)  Please take all new medications as prescribed - the ambien 5)  Continue all previous medications as before this visit  6)  Please schedule a follow-up appointment in 6 months, or sooner if needed Prescriptions: LYRICA 100 MG CAPS (PREGABALIN) 1 by mouth two times a day  #180 x 1   Entered and Authorized by:   Corwin Levins MD   Signed by:    Corwin Levins MD on 08/31/2010   Method used:   Print then Give to Patient   RxID:   [id number] ZOLPIDEM TARTRATE 10 MG TABS (ZOLPIDEM TARTRATE) 1 by mouth at bedtime as needed  #90 x 1   Entered and Authorized by:   Corwin Levins MD   Signed by:   Corwin Levins MD on 08/31/2010   Method used:   Print then Give to Patient   RxID:   [id number]    Medication Administration  Injection # 1:    Medication: Vit B12 1000 mcg    Diagnosis: VITAMIN B12 DEFICIENCY (ICD-266.2)    Route: IM    Site: R deltoid    Exp Date: 04/2012    Lot #: [id number]    Mfr: [employer]    Patient tolerated injection without complications    Given by: Zella Ball Ewing CMA (AAMA) (August 31, 2010 10:14 AM)  Orders Added: 1)  Vit B12 1000 mcg [J3420] 2)  Admin of Therapeutic Inj  intramuscular or subcutaneous [96372] 3)  Gastroenterology Referral [GI] 4)  Est. Patient 40-64 years [29528]

## 2010-09-19 LAB — BASIC METABOLIC PANEL
CO2: 27 mEq/L (ref 19–32)
Chloride: 103 mEq/L (ref 96–112)
GFR calc Af Amer: >60 mL/min (ref >60)
Potassium: 4.3 mEq/L (ref 3.5–5.1)
Sodium: 135 mEq/L (ref 135–145)

## 2010-09-19 LAB — DIFFERENTIAL
Basophils Relative: 1 % (ref 0–1)
Eosinophils Absolute: 0.2 10*3/uL (ref 0.0–0.7)
Monocytes Absolute: 0.6 10*3/uL (ref 0.1–1.0)
Monocytes Relative: 7 % (ref 3–12)

## 2010-09-19 LAB — CBC
HCT: 41.2 % (ref 36.0–46.0)
Hemoglobin: 14.3 g/dL (ref 12.0–15.0)
MCHC: 34.7 g/dL (ref 30.0–36.0)
MCV: 98.5 fL (ref 78.0–100.0)
RBC: 4.18 MIL/uL (ref 3.87–5.11)

## 2010-09-20 LAB — CBC
HCT: 40.2 % (ref 36.0–46.0)
HCT: 40.6 % (ref 36.0–46.0)
Hemoglobin: 14 g/dL (ref 12.0–15.0)
MCHC: 34.7 g/dL (ref 30.0–36.0)
MCV: 98.3 fL (ref 78.0–100.0)
MCV: 98.9 fL (ref 78.0–100.0)
Platelets: 244 10*3/uL (ref 150–400)
Platelets: 252 10*3/uL (ref 150–400)
RDW: 13.7 % (ref 11.5–15.5)
RDW: 13.8 % (ref 11.5–15.5)
WBC: 12.6 10*3/uL — ABNORMAL HIGH (ref 4.0–10.5)

## 2010-09-20 LAB — DIFFERENTIAL
Basophils Absolute: 0.1 10*3/uL (ref 0.0–0.1)
Basophils Absolute: 0.2 10*3/uL — ABNORMAL HIGH (ref 0.0–0.1)
Basophils Relative: 0 % (ref 0–1)
Basophils Relative: 1 % (ref 0–1)
Eosinophils Absolute: 0.3 10*3/uL (ref 0.0–0.7)
Eosinophils Relative: 2 % (ref 0–5)
Eosinophils Relative: 2 % (ref 0–5)
Monocytes Absolute: 0.7 10*3/uL (ref 0.1–1.0)
Neutro Abs: 10 10*3/uL — ABNORMAL HIGH (ref 1.7–7.7)

## 2010-09-20 LAB — BASIC METABOLIC PANEL
BUN: 11 mg/dL (ref 6–23)
Chloride: 104 mEq/L (ref 96–112)
Glucose, Bld: 105 mg/dL — ABNORMAL HIGH (ref 70–99)
Potassium: 4.6 mEq/L (ref 3.5–5.1)
Sodium: 139 mEq/L (ref 135–145)

## 2010-09-20 LAB — APTT: aPTT: 28 seconds (ref 24–37)

## 2010-09-21 LAB — DIFFERENTIAL
Basophils Relative: 1 % (ref 0–1)
Lymphs Abs: 1.7 10*3/uL (ref 0.7–4.0)
Monocytes Relative: 5 % (ref 3–12)
Neutro Abs: 5.7 10*3/uL (ref 1.7–7.7)
Neutrophils Relative %: 71 % (ref 43–77)

## 2010-09-21 LAB — PROTIME-INR
INR: 0.9 (ref 0.00–1.49)
Prothrombin Time: 12 seconds (ref 11.6–15.2)

## 2010-09-21 LAB — CBC
Platelets: 252 10*3/uL (ref 150–400)
RBC: 4.13 MIL/uL (ref 3.87–5.11)
WBC: 8.1 10*3/uL (ref 4.0–10.5)

## 2010-09-21 LAB — BASIC METABOLIC PANEL
Calcium: 9.3 mg/dL (ref 8.4–10.5)
Creatinine, Ser: 0.87 mg/dL (ref 0.4–1.2)
GFR calc Af Amer: >60 mL/min (ref >60)

## 2010-09-21 LAB — APTT: aPTT: 27 seconds (ref 24–37)

## 2010-09-25 LAB — CBC
HCT: 34.2 % — ABNORMAL LOW (ref 36.0–46.0)
HCT: 34.8 % — ABNORMAL LOW (ref 36.0–46.0)
Hemoglobin: 11.5 g/dL — ABNORMAL LOW (ref 12.0–15.0)
Hemoglobin: 11.9 g/dL — ABNORMAL LOW (ref 12.0–15.0)
MCHC: 34.1 g/dL (ref 30.0–36.0)
MCV: 97.8 fL (ref 78.0–100.0)
RBC: 3.48 MIL/uL — ABNORMAL LOW (ref 3.87–5.11)
RBC: 3.56 MIL/uL — ABNORMAL LOW (ref 3.87–5.11)
RDW: 14.4 % (ref 11.5–15.5)
WBC: 6.3 10*3/uL (ref 4.0–10.5)

## 2010-09-25 LAB — CARDIAC PANEL(CRET KIN+CKTOT+MB+TROPI)
CK, MB: 0.8 ng/mL (ref 0.3–4.0)
Relative Index: INVALID (ref 0.0–2.5)
Relative Index: INVALID (ref 0.0–2.5)
Troponin I: 0.01 ng/mL (ref 0.00–0.06)
Troponin I: 0.01 ng/mL (ref 0.00–0.06)

## 2010-09-25 LAB — DIFFERENTIAL
Basophils Absolute: 0 10*3/uL (ref 0.0–0.1)
Basophils Relative: 1 % (ref 0–1)
Eosinophils Absolute: 0.3 10*3/uL (ref 0.0–0.7)
Eosinophils Relative: 4 % (ref 0–5)
Monocytes Absolute: 0.5 10*3/uL (ref 0.1–1.0)
Monocytes Relative: 9 % (ref 3–12)

## 2010-09-25 LAB — COMPREHENSIVE METABOLIC PANEL
ALT: 16 U/L (ref 0–35)
Alkaline Phosphatase: 50 U/L (ref 39–117)
BUN: 8 mg/dL (ref 6–23)
Chloride: 118 mEq/L — ABNORMAL HIGH (ref 96–112)
Glucose, Bld: 101 mg/dL — ABNORMAL HIGH (ref 70–99)
Potassium: 4 mEq/L (ref 3.5–5.1)
Sodium: 147 mEq/L — ABNORMAL HIGH (ref 135–145)
Total Bilirubin: 0.4 mg/dL (ref 0.3–1.2)

## 2010-09-25 LAB — URINALYSIS, ROUTINE W REFLEX MICROSCOPIC
Bilirubin Urine: NEGATIVE
Nitrite: NEGATIVE
Protein, ur: NEGATIVE mg/dL
Specific Gravity, Urine: 1.011 (ref 1.005–1.030)
Urobilinogen, UA: 0.2 mg/dL (ref 0.0–1.0)

## 2010-09-25 LAB — GLUCOSE, CSF: Glucose, CSF: 70 mg/dL (ref 43–76)

## 2010-09-25 LAB — CSF CELL COUNT WITH DIFFERENTIAL
RBC Count, CSF: 1 /mm3 — ABNORMAL HIGH
RBC Count, CSF: 1 /mm3 — ABNORMAL HIGH
WBC, CSF: 2 /mm3 (ref 0–5)

## 2010-09-25 LAB — TROPONIN I: Troponin I: <0.01 ng/mL (ref 0.00–0.06)

## 2010-09-25 LAB — CSF CULTURE W GRAM STAIN

## 2010-09-25 LAB — BASIC METABOLIC PANEL
CO2: 26 mEq/L (ref 19–32)
Calcium: 8.3 mg/dL — ABNORMAL LOW (ref 8.4–10.5)
Chloride: 111 mEq/L (ref 96–112)
GFR calc Af Amer: >60 mL/min (ref >60)
Glucose, Bld: 117 mg/dL — ABNORMAL HIGH (ref 70–99)
Potassium: 3.5 mEq/L (ref 3.5–5.1)
Sodium: 142 mEq/L (ref 135–145)

## 2010-09-25 LAB — GRAM STAIN

## 2010-09-25 LAB — APTT: aPTT: 29 seconds (ref 24–37)

## 2010-09-25 LAB — LIPID PANEL
Cholesterol: 128 mg/dL (ref 0–200)
HDL: 38 mg/dL — ABNORMAL LOW (ref >39)
LDL Cholesterol: 67 mg/dL (ref 0–99)
Total CHOL/HDL Ratio: 3.4 RATIO
VLDL: 23 mg/dL (ref 0–40)

## 2010-09-25 LAB — URINE MICROSCOPIC-ADD ON

## 2010-09-25 LAB — HEMOGLOBIN A1C
Hgb A1c MFr Bld: 5.4 % (ref 4.6–6.1)
Mean Plasma Glucose: 108 mg/dL

## 2010-09-25 LAB — PROTEIN, CSF: Total  Protein, CSF: 41 mg/dL (ref 15–45)

## 2010-09-28 ENCOUNTER — Telehealth: Payer: Self-pay

## 2010-09-28 MED ORDER — PREGABALIN 100 MG PO CAPS: 100.0000 mg | ORAL_CAPSULE | Freq: Three times a day (TID) | ORAL | Status: DC

## 2010-09-28 NOTE — Telephone Encounter (Signed)
Pt called requesting to increase Lyrica to 300mg  as current dosage is not effective per pt. Medication increase from 200 to 300 last OV 08/31/2010.

## 2010-09-28 NOTE — Telephone Encounter (Signed)
Done hardcopy to dahlia/LIM B  

## 2010-10-02 ENCOUNTER — Ambulatory Visit: Payer: Medicare Other

## 2010-10-02 NOTE — Telephone Encounter (Signed)
Pt advised of Rx via VM, Rx in cabinet for pt pick up

## 2010-10-03 ENCOUNTER — Ambulatory Visit (INDEPENDENT_AMBULATORY_CARE_PROVIDER_SITE_OTHER): Payer: Medicare Other | Admitting: *Deleted

## 2010-10-03 DIAGNOSIS — E538 Deficiency of other specified B group vitamins: Secondary | ICD-10-CM

## 2010-10-03 MED ORDER — CYANOCOBALAMIN 1000 MCG/ML IJ SOLN
1000.0000 ug | Freq: Once | INTRAMUSCULAR | Status: AC
  Administered 2010-10-03: 1000 ug via INTRAMUSCULAR

## 2010-10-30 NOTE — Discharge Summary (Signed)
[name], [name] NO.:  [id number]   MEDICAL RECORD NO.:  [id number]          PATIENT TYPE:  INP   LOCATION:  5504                         FACILITY:  [facility]   PHYSICIAN:  Sanda Linger, MD       DATE OF BIRTH:  04/01/1956   DATE OF ADMISSION:  10/20/2008  DATE OF DISCHARGE:  10/22/2008                               DISCHARGE SUMMARY   PERSONAL PHYSICIANS:  1. Dr. Jonny Ruiz of primary care.  2. Dr. Ala Dach of Neurology.   DISCHARGE DIAGNOSES:  1. Atypical migraine with some neurological features.  2. History of cerebral aneurysms, which appear quiescent at this time;      however, her MRI report is pending at this time.  3. History of psych illness including obsessive-compulsive disease,      depression, and bipolar.  4. History of other medical problems including fibromyalgia, seizures,      tobacco abuse, chronic abdominal pain, gastroparesis, and      gastroesophageal reflux disease were well-controlled during this      admission.   HISTORY OF PRESENT ILLNESS:  [name] is a pleasant 55 year old female  who came to the emergency room after she had the onset of what she  thought was an atypical migraine headache, however, after the headache  developed she had some stumbling, ataxia, slurred speech, and confusion  stating she felt drunk.  All she had taken for the headache was some  ibuprofen.  She came to the emergency room, and after being seen in the  emergency room the headache and neurological symptoms quickly improved.  It is felt that she should be admitted for further diagnostics and  monitoring.   HOSPITAL COURSE:  She was monitored on an inpatient status.  She  gradually and consistently improved.  Her neurologic exam continued to  be nonfocal.  Her headache continued to dissipate.  At the time of  discharge, her only symptom is that she has mild discomfort in the  posterior aspect of both parietal areas of her skull.  She is reporting  that this is  about 3-4 on a scale of 10, which is consistent with her  typical migraines.   Her labs during this admission showed the only thing remarkable was that  her CSF, had a few white blood cells on it.  The cell count and  differential showed that there were 2 white blood cells, one red blood  cell, and the differential on the white blood cell was that it was in  the monocyte macrophage region.  Since there were some red blood cells,  this is consistent with a contaminated slightly bloody tap.  It is noted  that her CSF culture has been negative.  Her CSF protein and glucose are  within normal limits.  During this admission, she had normal cardiac  enzymes.  Her hemoglobin A1c is normal at 5.4, her homocystine level is  normal at 5.2, her TSH is normal at 1.927, her lipid profile shows a  total cholesterol of 128, triglycerides 114, HDL cholesterol 38, and LDL  cholesterol 67.  Her  CBC shows hemoglobin of 11.5, hematocrit 34.2 with  a normal white blood cell and platelet count.  Her comprehensive  metabolic panel shows a slightly high sodium at 147, potassium is normal  at 4.0, chloride is slightly high at 118, CO2 is normal at 26, glucose  slightly elevated at 101, BUN 8, and creatinine 1.08.  Her GFR is close  to normal at 54.  Her liver enzymes are within normal limits.  Her total  protein, albumin, and calcium are all slightly low.   CURRENT MEDICATIONS:  At the time of discharge are the same at  admissions and that is:  1. Ecotrin 81 mg once a day.  2. Ultrase MT 12 mg 3 times a day.  3. Lamotrigine 200 mg at bedtime.  4. Topiramate 100 mg twice a day.  5. Budeprion XL 300 mg once a day.  6. Temazepam 30 mg at bedtime as needed for insomnia.  7. Savella 50 mg 1 twice a day.  8. Protonix 40 mg once a day.  9. Potassium chloride 10 mEq once a day.  10.Crestor 20 mg once a day.  11.Abilify oral 10 mg in the morning.  12.Sertraline 100 mg oral in the morning.   FOLLOWUP:  Her  discharge follow up would be with her primary care  physician, Dr. Oliver Barre in 2-3 weeks and with her neurologist Dr. Ala Dach  in 2-3 weeks.   ADDITIONAL NOTE AT THE TIME OF THIS DICTATION:  The radiologist is  reviewing her MRI of the brain, which was done approximately 12 hours  prior to this dictation.  If there is an abnormality on that report,  then her discharge will be held and further evaluation will be  entertained; however, if these are within normal limits, I will proceed  with discharge.   Greater than 30 minutes was spent on this discharge, paperwork, and  arrangements.      Sanda Linger, MD  Electronically Signed     TJ/MEDQ  D:  10/22/2008  T:  10/22/2008  Job:  [id number]   cc:   Corwin Levins, MD  Dr. Ala Dach

## 2010-10-30 NOTE — H&P (Signed)
NAMECHARDA, [name]                ACCOUNT NO.:  [id number]   MEDICAL RECORD NO.:  [id number]          PATIENT TYPE:  INP   LOCATION:  5504                         FACILITY:  [facility]   PHYSICIAN:  [name], MDDATE OF BIRTH:  1956-02-17   DATE OF ADMISSION:  10/20/2008  DATE OF DISCHARGE:                              HISTORY & PHYSICAL   PRIMARY CARE [name]:  Lori Levins, MD   CHIEF COMPLAINT:  Headache, slurred speech and abnormal gait.   The patient is a 55 year old female with history of bipolar disorder,  fibromyalgia, migraine headaches.  The patient stated that since  Wednesday she developed a headache which was mainly in the back of her  head, trouble walking.  When she got up off the bed she felt like she  wanted to fall back into it.  When she was walking, she stumbled and hit  her hand against the edge of a doorway.  She feels kind of almost drunk.  She had some maybe slurred speech but currently has improved and maybe  some confusion.  Her headache is now much improved but still mildly  persistent.  Of note, the patient does have a history of cerebral  aneurysms which were stented in the past and followed by neurology.   She had not had any fevers or chills, no vomiting but occasional nausea.  Otherwise unremarkable.  Her MS review of systems unremarkable.  She had  been feeling fairly well up until this point.  She always has a little  shortness of breath and occasional cough.  She has smoker's cough.  Otherwise review of systems unremarkable.   SOCIAL HISTORY:  The patient continues to smoke about 10 cigarettes a  day.  Does not drink or abuse drugs.   FAMILY HISTORY:  Significant for coronary artery disease, cancer,  diabetes in her family.   PAST MEDICAL HISTORY:  1. Significant for history of bipolar disorder.  2. History of cerebral aneurysm, status post stenting.  Followed by      Dr. Corliss [name].  3. Migraine headaches.  4. Obsessive-compulsive  disorder.  5. GERD.  6. Gastroparesis.  7. Chronic abdominal pain.  8. History of esophageal strictures.  9. Tobacco abuse.  10.History of seizures.  11.History of auditory hallucinations.  12.Mild depression.  13.Fibromyalgia.   ALLERGIES:  DARVOCET and OXYCONTIN.   MEDICATIONS:  1. Ecotrin 81 mg daily.  2. Ultrase MT12 one capsule 3 times a day.  3. Lamotrigine 200 mg at bedtime.  4. Topiramate 100 mg twice a day.  5. Budeprion XL 300 mg every morning.  6. Temazepam 30 mg at bedtime.  7. Savella 50 mg twice a day.  8. Protonix 40 mg daily.  9. Potassium 10 mEq daily.  10.Crestor 10 mg a day.  11.Abilify 10 mg daily.  12.Sertraline 100 mg every morning.   Of note, the patient states that none of her medicines has changed  recently.   VITAL SIGNS:  Temperature 97.5, blood pressure 100/69, pulse 108 and now  down to 84, respirations 17, saturating 100% on room air.  The patient  appears to be in no acute distress.  Head nontraumatic.  Having dry mucous membranes.  LUNGS:  Occasional crackles but no wheezes.  HEART:  Regular rate and rhythm, no murmurs appreciated.  ABDOMEN:  Soft with slight epigastric tenderness that she says is  chronic.  Otherwise, no rebound or guarding.  Lower extremities without clubbing, cyanosis, or edema.  Strength 5/5 in  all 4 extremities.  Cranial nerves intact.  Finger-to-nose intact.  The patient appears to  have equivocal Babinski bilaterally.   LABORATORY DATA:  The patient has undergone an LP with only 1 red blood  cell and 1 white blood cell, results of which have been sent out.  Glucose 70, total protein 41.  It appears to be unremarkable.   White blood cell count 7.3, hemoglobin 11.9.  Sodium 142, potassium 3.5,  creatinine 1.1.  EKG showed a normal sinus rhythm.  CT scan of the head  is unremarkable.  INR 1.0.   ASSESSMENT AND PLAN:  This is a 55 year old female with vague complaints  of headache.  There is also a history of  aneurysms in the past.   1. Headache, altered mental status, ataxia.  Cannot rule out possible      transient ischemic attack.  At this point we will obtain an MRI/MRA      of the brain.  Recommend neurology consult as the patient has      extensive neurologic history.  We will hold Savella as this can      cause headaches extensively.  Would recommend a psychiatric consult      to have her medications simplified.  She is at quite a      polypharmacy, which could contribute to altered mental status.  2. History of gastroesophageal reflux disease.  Continue Protonix.  3. Until able to rule out transient ischemic attack, we will obtain 2-      D echo and carotid Dopplers in the a.m.  4. Tobacco abuse.  Encouraged discontinuation.  5. History of bipolar disorder.  I think the patient would benefit      from psychiatry consult to help with this management.  6. Prophylaxis.  Protonix with Lovenox.      Lori Cowboy, MD  Electronically Signed     AVD/MEDQ  D:  10/21/2008  T:  10/21/2008  Job:  [id number]   cc:   Lori Levins, MD

## 2010-10-30 NOTE — Consult Note (Signed)
[name], [name]                ACCOUNT NO.:  [id number]   MEDICAL RECORD NO.:  [id number]          PATIENT TYPE:  OUT   LOCATION:  XRAY                         FACILITY:  [facility]   PHYSICIAN:  [name], M.D.DATE OF BIRTH:  20-Jun-1955   DATE OF CONSULTATION:  DATE OF DISCHARGE:  11/02/2008                                 CONSULTATION   CHIEF COMPLAINT:  Cerebral aneurysms.   BRIEF HISTORY:  This is a very pleasant 55 year old female well known to  Dr. Corliss [name].  The patient was diagnosed as having cerebral aneurysms  in 2001 after an MRI/MRA was performed to evaluate headaches.  She was  referred to Dr. 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  She later returned in July  2007 for stenting of a basilar artery aneurysm.  She has been followed  since that time with MRAs and repeat cerebral angiograms.  The patient  was brought back in October 2007 for further intervention.  However, at  that time it was felt that her aneurysms were stable and continued  monitoring was recommended.   The patient was recently admitted to [facility] from Oct 20, 2008, to Oct 22, 2008 for evaluation of a particularly severe headache  that was associated with slurred speech, mild confusion, and difficulty  walking.  There was some concern that the patient may have suffered  stroke; however, this was ruled out.  She had an MRI performed on Oct 21, 2008, at which time her aneurysms appear to be stable.  Her last  angiogram was performed in June 2009.  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  The patient presents today for a followup visit with Dr.  Corliss [name].   PAST MEDICAL HISTORY:  The patient has a long history of migraine  headaches and these are followed by Dr. Ala [name] in [facility].  He  recently  changed her from Topamax to Depakote.  She also had a steroid dose pack.  The patient initially felt that her headaches were somewhat improved,  although she was unable to say whether it was the Depakote or the  steroids.  Today, she states that her head feels like it is in a vise.  The patient also has a history of bipolar disorder as well as obsessive-  compulsive disorder.  These issues are followed by her psychiatrist, Dr.  Wynonia [name].  She has gastroesophageal reflux disease as well as  gastroparesis.  She has chronic abdominal pain and history of esophageal  strictures, a history of seizures, a history of auditory hallucinations,  and a history of ongoing tobacco use.  She has fibromyalgia and chronic  pain.   SURGICAL HISTORY:  Significant for a hysterectomy, appendectomy,  cholecystectomy, shoulder surgery, elbow surgery, and surgery for a  ruptured ovarian cyst.  The patient has had urinary retention associated  with anesthesia in the past.   ALLERGIES:  She is allergic  to DARVOCET and OXYCONTIN, both of which  have caused a rash and itching.  She is intolerant to LACTOSE.   SOCIAL HISTORY:  The patient is widowed times approximately 4 years.  She has two children.  She lives in [place].  She continues to  smoke despite recommendations to quit.  She states she smokes anywhere  between 5 to 15 cigarettes per day.  This represents a decrease from her  previous tobacco intake.  She denies alcohol use.   FAMILY HISTORY:  Her mother died in her 32s from cancer.  Her mother  also had diabetes and coronary disease.  Her father died in his 32s or  100s from cancer.   CURRENT MEDICATIONS:  The patient is now on Depakote 500 mg at bedtime  for her headaches.  She states her other medications are unchanged from  her previous hospital discharge.  The medications listed at that time  include:  1. Ecotrin 81 mg daily.  2. Ultrase MT 12 mg three times daily.  3. Lamotrigine  200 mg at bedtime.  4. Budeprion 300 mg once daily.  5. Temazepam 30 mg at bedtime.  6. Savella 50 mg twice daily.  7. Protonix 40 mg daily.  8. Potassium chloride 10 mg daily.  9. Crestor 20 mg daily.  10.Abilify 10 mg in the morning.  11.Sertraline 100 mg in the morning.  12.She had been on Topamax, although this was discontinued when her      Depakote was started.   Dr. Corliss [name] reviewed the results of her recent MRI performed on Oct 21, 2008.  He also reviewed the results of her most recent cerebral  angiogram.  We discussed the patient's headaches and response to her new  medical regimen.  It is probably too early to say whether this is going  to show any significant improvement.  Dr. Corliss [name] did feel that she  should consider having the basilar artery aneurysm treated further with  either the addition of another stent or possibly coiling.  He felt that  this could be performed in sometime in July 2010 or August 2010.  The  patient was in agreement with this plan.  He recommended that she be  started on Plavix 3 days prior to the intervention in the event that a  stent is required.  We did give the patient a prescription for Plavix  today.   It also appears that the patient has lost a great deal of weight since  the last time we saw her.  We asked her about this and she denied  significant weight loss.  Greater than 25 minutes was spent on this  followup visit.      [name], P.A.    ______________________________  [name]. [name], M.D.    DR/MEDQ  D:  11/02/2008  T:  11/03/2008  Job:  [id number]   cc:   Lori Levins, MD  [name], M.D.  [name], M.D.

## 2010-11-02 ENCOUNTER — Ambulatory Visit: Payer: Medicare Other

## 2010-11-02 NOTE — Assessment & Plan Note (Signed)
REFERRING PHYSICIAN:  Corwin Levins, M.D.   [name].  I evaluated,  reviewed the health and history form and 14-point review of systems.   1. [name] has accepted a hydromorphone prescription, Dilaudid, from the ER,     and we review the patient-care agreement with her.  We also note that she     has not brought her pills in as I had explicitly requested, with the     exception of the hydromorphone, and has violated the patient-care     agreement.  2. We were weaning her off hydrocodone, and I think we are pretty safe to     say that she is off her medications using only p.r.n. medications.  I am     not concerned about withdrawal at this time, as I perform an inventory.  3. I will be happy to treat her pain, offering her rehab, functional     enhancements, TENS unit, and possible interventional procedures, but I do     not think we are going to need to move too far forward with controlled     substances.  She has essentially self-weaned down, and she has violated     patient-care agreement.  She was seen in the ER for kidney stone/back     pain, but no advancing problems were found in either arena.  4. Again, by examination, there is some distractibility, and mostly     myofascial presentation.  I do not see any new neurological findings.   We, of course, will offer detoxification if she so requests, the nurse is  present, we review patient-care agreement, and will certainly leave the door  open to her for further treatment, it is just we will not be prescribing  controlled substances.   OBJECTIVE:  Diffuse paralumbar myofascial discomfort, pain over PSIS and  notable pain on extension, Gaenslen's and Patrick's not tested.  Primarily  myofascial examination.  No new neurological findings.   IMPRESSION:  1. Myofascial pain syndrome.  2. Degenerative spinal disease lumbar spine.   PLAN:  Conservative management.  Will follow up with  her in one month if she  requests, or sooner if she requires further assessment, and she is aware we  are available in a compassionate care arena for her needs.      Celene Kras, MD   HH/MedQ  D:  11/08/2003 09:48:55  T:  11/08/2003 10:33:23  Job #:  [id number]   cc:   Corwin Levins, M.D. [facility]

## 2010-11-02 NOTE — Consult Note (Signed)
[name], [name]                ACCOUNT NO.:  [id number]   MEDICAL RECORD NO.:  [id number]          PATIENT TYPE:  OUT   LOCATION:  XRAY                         FACILITY:  [facility]   PHYSICIAN:  Sanjeev K. Deveshwar, M.D.DATE OF BIRTH:  1955-06-25   DATE OF CONSULTATION:  05/03/2005  DATE OF DISCHARGE:                                   CONSULTATION   CHIEF COMPLAINT:  Cerebral aneurysm.   HISTORY OF PRESENT ILLNESS:  This is a 55 year old female with a long  history of migraine headaches. Back in 2001 she was found to have a right  middle cerebral artery aneurysm. This has been followed with yearly MRIs.  Her MRI in May of this year showed a question of a new aneurysm in the right  superior cerebellar artery at its junction with the basilar artery. The  patient was referred to Dr. Venetia [name], who in turn referred the patient to Dr.  Corliss [name] for a cerebral angiogram. The angiogram was performed on March 04, 2005. This did show the right middle cerebral artery saccular aneurysm  measured at approximately 5 mm x 4.5 mm as well as a saccular aneurysm  arising from the basilar artery at the level of the right superior  cerebellar artery measuring approximately 4.5 x 2.5 mm. The patient is here  today to discuss treatment options with [name].   PAST MEDICAL HISTORY:  Significant for migraine headaches. She also has  history of bipolar disorder with obsessive/compulsive disorder. She has a  history of cerebral aneurysms as noted. She has gastroesophageal reflux  disease, gastroparesis, chronic abdominal pain followed by Dr. Jarold [name],  history of esophageal strictures, a long history of tobacco use (she is  trying to quit). She has had a question of seizures treated with Topamax by  Dr. Ala [name]. She also tells me that she has auditory hallucinations.   PAST SURGICAL HISTORY:  1.  Total abdominal hysterectomy.  2.  Shoulder surgery.  3.  Elbow surgery.  4.  Cholecystectomy.  5.   Appendectomy.  6.  Surgery for a ruptured ovarian cyst.   ALLERGIES:  DARVOCET and OXYCONTIN both  of which cause itching.   CURRENT MEDICATIONS:  1.  Effexor 150 mg two daily.  2.  Crestor 20 mg daily.  3.  Lasix 40 mg daily.  4.  Abilify 10 mg at bedtime.  5.  Potassium 10 mEq daily.  6.  Singulair 10 mg at bedtime.  7.  Seroquel 300 mg b.i.d.  8.  Klonopin 1 mg b.i.d.  9.  Allegra 180 mg daily.  10. Protonix 40 mg b.i.d.  11. Amitiza 24 mcg b.i.d.  12. Carafate 1 gm one hour before meals.  13. Neurontin 600 mg three tablets daily.  14. Nasonex each morning.  15. Astelin q.12h.  16. Xalatan one drop each evening in the left eye.  17. Topamax 100 mg at bedtime.   SOCIAL HISTORY:  The patient is widowed. She lost her husband this past  February. She has been very depressed about this. She has two children. She  lives in [place]. One of  her sons lives with her. She has been smoking  for at least 30 years. She is trying to quit. She is down to six or seven  cigarettes per day. She denies alcohol use.   FAMILY HISTORY:  Her mother died in her mother 9s from cancer. She also had  diabetes and coronary artery disease.  Her father died in his 88s or 2s  from cancer.   IMPRESSION/PLAN:  As noted, the patient met with [name] today to  discuss possible treatment options for her two cerebral aneurysms. Options  discussed included continued monitoring as well as open surgery and clipping  or endovascular coiling. The risks and benefits of each approach were  discussed in detail. The patient wishes to proceed with coiling. Dr.  Corliss [name] recommended repeating the angiogram prior to intervening to  further evaluate the aneurysms. He felt that one of the aneurysms might  require stenting as well. A decision regarding which aneurysm regarding  which aneurysm to treat first would be made following repeat angiogram. The  patient is anxious to proceed. We will try to schedule  her for the angiogram  and the intervention in December.   Greater than 40 minutes was spent on this consult.      [name], P.A.    ______________________________  Grandville Silos. [name], M.D.    DR/MEDQ  D:  05/03/2005  T:  05/03/2005  Job:  [id number]   cc:   [name], M.D.  Fax: [phone]   [name], M.D. [facility]  [address]  [place]  [place] [address]   [name]  Fax: [phone]   [name], M.D.  Fax: [phone]

## 2010-11-02 NOTE — H&P (Signed)
NAMEWRENLEY, [name]                ACCOUNT NO.:  [id number]   MEDICAL RECORD NO.:  [id number]          PATIENT TYPE:  AMB   LOCATION:  SDS                          FACILITY:  [facility]   PHYSICIAN:  Sanjeev K. Deveshwar, M.D.DATE OF BIRTH:  June 06, 1956   DATE OF ADMISSION:  06/19/2005  DATE OF DISCHARGE:                                HISTORY & PHYSICAL   CHIEF COMPLAINT:  Cerebral aneurysms for possible coiling today.   HISTORY OF PRESENT ILLNESS:  This is a 55 year old female with a long  history of migraine headaches.  In 2001 she was found to have a right middle  cerebral artery aneurysm.  She has had follow up MRI since that time.  Her  most recent study in May of this year showed a question of a new aneurysm in  the right superior cerebellar artery.  The patient was referred to Dr. Venetia Maxon  who in turn referred the patient to Dr. Corliss Skains for a cerebral angiogram.  The angiogram was performed 03/04/05, and did show a right middle cerebral  artery saccular aneurysm which measured approximately 5.0 x 4.5 mm as well a  saccular aneurysm arising from the basalar artery at the level of the right  superior cerebellar artery measuring approximately 4.5 x 2.5 mm.  The  patient returns today for possible coiling of the aneurysms.   PAST MEDICAL HISTORY:  The patient has a history of migraine headaches.  She  also has a history of bipolar disorder with an obsessive/compulsive  disorder.  She has a history of cerebral aneurysms as noted.  She has  gastroesophageal reflux disease, gastroparesis and chronic abdominal pain  followed by Dr. Jarold Motto.  She has a history of esophageal strictures.  A  long history of tobacco use.  She has been trying to quit.  She has a  history of seizures treated with Topamax followed by Dr. Ala Dach.  She has had  auditory hallucinations.   PAST SURGICAL HISTORY:  Significant for a total abdominal hysterectomy,  shoulder surgery, elbow surgery, cholecystectomy,  appendectomy and surgery  for a ruptured ovarian cyst.   ALLERGIES:  DARVOCET AND OXYCONTIN, both of which cause itching.   MEDICATIONS:  1.  Effexor 150 mg 2 tablets daily.  2.  Crestor 20 mg daily.  3.  Lasix 40 mg daily.  4.  Abilify 10 mg at bedtime.  5.  Potassium 10 mEq daily.  6.  Singulair 10 mg at bedtime.  7.  Seroquel 300 mg twice daily.  8.  Klonopin 1 mg b.i.d.  9.  Allegra 180 mg daily.  10. Protonix 40 mg b.i.d.  11. Amitiza 24 mcg twice daily.  12. Carafate 1 gram 1 hour before meals.  13. Neurontin 600 mg 3 tablets daily.  14. Nasonex each morning.  15. Astelin every 12 hours.  16. Xalatan 1 drop each evening in the left eye.  17. Topamax 100 mg at bedtime.   SOCIAL HISTORY:  The patient is widowed.  She lost her husband in 02/06.  She has been very depressed about this.  She has 2 children.  She lives in  [place].  Her son lives with her.  She has been smoking for at least 30  years.  She is trying to quit.  She denies alcohol use.   FAMILY HISTORY:  Her mother died in her 38's from cancer.  She also had  diabetes and coronary disease.  Her father died in the 35's or 12's from  cancer.   LABORATORY DATA:  INR of 0.9.  PTT is 27.  CBC reveals hemoglobin of 14.8,  hematocrit 43.1, WBC 11.8, platelets 281,000, BUN is 14, creatinine 1.2,  potassium 4.3, glucose of 74.   REVIEW OF SYSTEMS:  Negative except for the following.  The patient has an  occasional cough which she attributes to her smoking.  She has occasional  yellow sputum.  She has had a sore throat recently with occasional  headaches.  She bruises easily.  She has a problem with urinary retention.  She does self catheterizations at home.   PHYSICAL EXAMINATION:  Reveals a somewhat sleepy 55 year old white female  accompanied by her daughter.  The patient and the daughter reports that she  did not sleep well last night.  VITAL SIGNS:  Blood pressure 117/72, pulse 100, respirations 20,  temperature  96.  Oxygen saturation on room air is 99%.  HEENT:  Unremarkable.  NECK:  No bruits.  No jugular venous distention.  HEART:  Regular rate and rhythm without murmur.  ABDOMEN:  Obese, soft, nontender.  EXTREMITIES:  Shows pulses to be intact with trace edema.   Her airway was rated at a 3 or a 4.  Her ASA scale was a 3.  MENTAL STATUS:  The patient was alert and oriented and follows commands.  Cranial nerves II-XII were grossly intact.  Sensation is intact to light  touch.  Motor strength is diminished throughout.  However the grip strength  were noted to be significantly diminished.  Right upper extremity grip  strength was approximately 3/5.  Left was 2/5.  The remainder of her motor  strength was 4-5/5.  The patient may not have been participating fully with  the exam.  Cerebellar testing was slow but intact.   IMPRESSION:  1.  History of cerebral aneurysms.  2.  History of migraine headaches.  3.  History of bipolar disorder with obsessive/compulsive disorder.  4.  Gastroesophageal reflux disease.  5.  Gastroparesis and chronic abdominal pain as well as esophageal      strictures.  6.  Long history of tobacco use, question of chronic obstructive pulmonary      disease.  7.  History of seizures, treated with Topamax.  8.  History of auditory hallucinations.  9.  Multiple surgeries as listed above.  10. Allergies to DARVOCET and OXYCONTIN.  11. Mildly elevated white blood cell count with no fever.  12. Urinary retention.   PLAN:  The patient will receive Ancef and aspirin prior to the intervention  today.  She will not receive nimodipine secondary to her mildly low blood  pressure.  The mildly elevated white blood cell count as well as her sore  throat and occasional cough were discussed with Dr. Corliss Skains and the plan  at this time is to treat with IV Ancef prior to the intervention and to  proceed as planned.      [name],  P.A.   ______________________________  Grandville Silos. Corliss Skains, M.D.    DR/MEDQ  D:  06/19/2005  T:  06/19/2005  Job:  [id number]   cc:  Danae Orleans. Venetia Maxon, M.D.  Fax: [phone]   Corwin Levins, M.D. [facility]  [address]  [place]  [place] [address]   Merita Norton  Fax: [phone]   Lynden Ang, M.D.  Fax: [phone]   Vania Rea. Jarold Motto, M.D. [facility]  [address]  [place]  [place] [address]

## 2010-11-02 NOTE — Procedures (Signed)
[place]  Patient:    [name], [name] Visit Number: [id number] MRN: [id number]          Service Type: CAT Location: [facility] 2856 01 Attending Physician:  Lewayne Bunting Dictated by:   Doylene Canning. Ladona Ridgel, M.D. [facility] Proc. Date: 11/18/01 Admit Date:  11/18/2001 Discharge Date: 11/18/2001   CC:         Dietrich Pates, M.D. [facility]  Corwin Levins, M.D. [facility]   Procedure Report  PROCEDURE:  Head-up tilt table testing utilizing isoproterenol.  INDICATION:  Syncope of unclear etiology.  INTRODUCTION:  The patient is a pleasant 55 year old woman with no major cardiac problems who has had recurrent episodes of syncope of unclear etiology.  They typically occur in the upright posture.  She is now referred for head-up tilt table testing.  DESCRIPTION OF PROCEDURE:  After informed consent was obtained, the patient was taken to the diagnostic EP lab in a fasted state.  After the usual preparation, she was placed in the supine position where her blood pressure was 130/80, pulse was 92.  She was observed over five minutes, and the blood pressure remained 132/89 and with a pulse of 84.  She was placed in the head-up tilt position, and her blood pressure went down from 132 down to 117 systolic and her heart rate increased from 84 to 100.  She was maintained in the head-up tilt table position for a total of 30 minutes.  At that time her systolic blood pressure remained in the 100-120 range with one episode dipping down to 99/69.  Her heart rate increased from 100 up to 120, where it stabilized.  She remained in this position for 30 minutes with no syncope. She was subsequently replaced in the supine position, where her blood pressure increased from 110 to 120 and her heart rate fell from 124 down to 72.  She was given 1.5 mcg of isoproterenol.  Her heart rate increased from 72 back up to 110, and she was placed in the 70 degree head-up position.  Her heart rate jumped  from 110 up to 130 and remained in the 130-140 range, but her blood pressure remained stable in the 100-110 range.  There was no recurrent syncope.  She was subsequently placed in the supine position and returned to her room in satisfactory condition.  COMPLICATIONS:  There were no immediate procedural complications.  RESULTS:  This study demonstrates no evidence of inducible neurally-mediated syncope.  There was evidence of some venous pooling as manifested by return in the heart rate at the end of tilting from 124 down to 72, perhaps suggestive of some venous pooling but no documented syncope. Dictated by:   Doylene Canning. Ladona Ridgel, M.D. [facility] Attending Physician:  Lewayne Bunting DD:  11/18/01 TD:  11/20/01 Job: [id number]

## 2010-11-02 NOTE — Discharge Summary (Signed)
[name], [name]                ACCOUNT NO.:  [id number]   MEDICAL RECORD NO.:  [id number]          PATIENT TYPE:  OIB   LOCATION:  2550                         FACILITY:  [facility]   PHYSICIAN:  Sanjeev K. Deveshwar, M.D.DATE OF BIRTH:  1956/02/21   DATE OF ADMISSION:  06/19/2005  DATE OF DISCHARGE:  06/20/2005                                 DISCHARGE SUMMARY   CHIEF COMPLAINT:  Cerebral aneurysm.   BRIEF HISTORY:  This is a pleasant 54 year old female with a long history of  migraine headaches.  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  This has been followed over the years with MRIs.  Her  MRI in May 2006 showed a question of a new aneurysm in the right superior  cerebellar artery.  The patient was referred to Dr. Venetia Maxon who, in turn,  referred the patient to Dr. Corliss Skains.  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  The  patient was seen in consultation by Dr. Corliss Skains on May 03, 2005, and  arrangements were made to have her return on June 19, 2005, for coiling of  the aneurysm under general anesthesia.   PAST MEDICAL HISTORY:  Significant for migraine headaches.  She has a  history of bipolar disorder with obsessive compulsive disorder.  She has the  cerebral aneurysms as noted.  She has a history of gastroesophageal reflux  disease, gastroparesis, chronic abdominal pain followed by Dr. Jarold Motto.  History of esophageal strictures.  History of tobacco use, she has been  trying to quit.  She has a history of seizures treated with Topamax by Dr.  Ala Dach.  She also has a history of auditory hallucinations.   PAST SURGICAL HISTORY:  Significant for total  abdominal hysterectomy,  shoulder surgery, elbow surgery, cholecystectomy, appendectomy, and a  history of a ruptured ovarian cyst.   ALLERGIES:  Darvocet and OxyContin, both which cause itching.   SOCIAL HISTORY:  The patient is widowed, she lost her husband in February  2006.  She has been very depressed about this.  She has two children.  She  lives in [place].  Her son lives with her.  She has been smoking for at  least 30 years, she is trying to quit.  She denies alcohol use.   FAMILY HISTORY:  Her mother died in her 33s from cancer.  She also had  diabetes and coronary artery disease.  Her father died in his 21s or 61s  from cancer.   HOSPITAL COURSE:  As noted, this patient was admitted to [facility]  on June 19, 2005, to undergo endovascular treatment of the above noted  aneurysms.  On the day of admission, under general anesthesia, Dr. Corliss Skains  placed a Neuroform stent and performed coiling of the right middle cerebral  artery aneurysm.  The patient tolerated the procedure well.  She was  admitted to the neurological PACU and later to the OR PACU as there were no  neurological intensive care unit beds available.  The patient was treated  with heparin overnight and monitored closely.  The following day, the  heparin was discontinued and the right femoral groin sheath was removed.  Hemostasis was obtained, although the patient did have some oozing from the  wound several hours later.  The patient remained on bedrest for at least six  hours.  The plans at this time are to ambulate the patient and proceed with  discharge later this evening if she remains medically stable.  There were no  neurological deficits noted and no immediate or known complications of the  intervention.   LABORATORY DATA:  The patient did have a mildly elevated white blood cell  count on the day of admission, 11,800, she was not febrile, there was no  specific etiology for this elevated  white blood cell count.  The remainder  of her CBC was normal.  On April 20, 2006, CBC revealed hemoglobin 12.4,  hematocrit 36, WBC 9,500, platelets 238,000.  On the day of discharge, BUN  9, creatinine 0.9, potassium 4.3, glucose 98, the remainder was normal  except for a chloride of 116.   DISCHARGE MEDICATIONS:  The patient was told to continue her home  medications as previously taken with the addition of Plavix 75 mg daily for  two weeks and aspirin 81 mg daily indefinitely.  Her home medications  consisted of Seroquel 300 mg each morning and Seroquel 600 mg at bedtime,  Neurontin 600 mg q.a.m. and 1200 mg at bedtime, Effexor 150 mg three each  morning, Protonix 40 mg b.i.d., Fexofenadine 180 mg q.a.m., Singular 10 mg  at bedtime, Ranitidine 150 mg q.12h., Crestor 20 mg q.a.m., Lasix 40 mg  b.i.d., Klonopin 1 mg t.i.d., Klor-Con 10 mg q.a.m., Topamax 100 mg at h.s.,  Zyrtec 10 mg q.a.m., Abilify 20 mg q.a.m., Promethazine 25 mg p.r.n., Ambien  p.r.n., Optivan 1 drop q.12h., Nasonex q.a.m., Xalatan 1 drop at bedtime,  and Astelin nasal spray b.i.d.   DISCHARGE INSTRUCTIONS:  The patient was told to avoid any strenuous  activity for two weeks, she is not to drive for two weeks, she was not to  lift more than 10 pounds until cleared by Dr. Corliss Skains.  She was to have a  repeat angiogram in approximately three months.  She will see Dr. Corliss Skains  in follow up  January 17 at 3 p.m.   ADDENDUM:  The patient reported that she had recently had an allergic  reaction believed to be related to eating pork.  She had seen a physician  for this problem and been treated with antihistamines.  She was noted to  have a rather large bruise on her right shoulder and one on her chest at the  time of admission.  She attributed the bruise on her right shoulder to  running into a door when she was leaving to go to church and the one on her chest she felt was due to her reaction from pork, although this  did appear  to be a large hematoma approximately the size of a 50 cent piece.   DISCHARGE DIAGNOSIS:  1.  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  2.  History of migraine headaches.  3.  History of bipolar disorder with obsessive compulsive disorder.  4.  History of gastroesophageal reflux disease, gastroparesis, chronic      abdominal pain, and esophageal strictures.  5.  History of tobacco use, the patient has been instructed to try to quit      smoking.  6.  Seizure history treated with Topamax.  7.  History of auditory hallucinations.  8.  Status post multiple surgeries.  9.  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      [name], P.A.    ______________________________  Grandville Silos. Corliss Skains, M.D.    DR/MEDQ  D:  06/20/2005  T:  06/20/2005  Job:  [id number]   cc:   Danae Orleans. Venetia Maxon, M.D.  Fax: [phone]   Corwin Levins, M.D. [facility]  [address]  [place]  [place] [address]   Lynden Ang, M.D.  Fax: [phone]   Dr. Olivia Canter R. Jarold Motto, M.D. [facility]  [address]  [place]  [place] [address]

## 2010-11-02 NOTE — H&P (Signed)
[name], BOURN                ACCOUNT NO.:  [id number]   MEDICAL RECORD NO.:  [id number]          PATIENT TYPE:  AMB   LOCATION:  SDS                          FACILITY:  [facility]   PHYSICIAN:  Sanjeev K. Deveshwar, M.D.DATE OF BIRTH:  [birth year]   DATE OF ADMISSION:  01/01/2006  DATE OF DISCHARGE:                                HISTORY & PHYSICAL   CHIEF COMPLAINT:  Cerebral aneurysm.   HISTORY OF PRESENT ILLNESS:  This is a pleasant 55 year old female who was  found to have multiple cerebral aneurysms by an MRA performed on 2001. The  patient was referred to Dr. Corliss Skains. On June 19, 2005, the patient  underwent stenting of the larger right middle cerebral artery aneurysm. She  has a residual basilar artery aneurysm and there was also a 2 mm right  middle cerebral artery aneurysm; however, this was also covered by the stent  placement. The patient is now readmitted for a followup cerebral angiogram  and possible further intervention.   PAST MEDICAL HISTORY:  Significant for migraine headaches, bipolar disorder,  obsessive compulsive disorder, gastroesophageal reflux disease,  gastroparesis, chronic abdominal pain, esophageal strictures, ongoing  tobacco use, a history of seizures, a history of auditory hallucinations and  a history of cerebral aneurysms.   PAST SURGICAL HISTORY:  Significant for a total abdominal hysterectomy,  shoulder surgery, elbow surgery, cholecystectomy, appendectomy and surgery  for a ruptured ovarian cyst.   ALLERGIES:  DARVOCET and OXYCONTIN. She is lactose intolerant.   SOCIAL HISTORY:  The patient is widowed, she lost her husband in February  2006. She has been depressed since that time. She has 2 children, she lives  in [place], her son lives with her. She continues to smoke, she has  been trying to quit. She denies alcohol use.   FAMILY HISTORY:  Her mother died in her 69s from cancer, she also had  diabetes and coronary artery disease.  Her father died in his 51s of 56s from  cancer.   CURRENT MEDICATIONS:  1.  Aspirin 325 mg daily.  2.  Gabapentin 600 mg 1 a.m., 2 p.m.  3.  Topamax 25 mg 1 a.m. and 4 at bedtime.  4.  Amitiza 24 mEq 1 b.i.d.  5.  Clonazepam 1 mg t.i.d.  6.  Abilify 20 mg 1-1/2 tablets q.a.m.  7.  Lasix 40 mg q.a.m.  8.  Singulair 10 mg q.p.m.  9.  Rosuvastatin 20 mg q.a.m.  10. Fexofenadine 180 mg q.a.m.  11. Ambien CR 12.5 mg q.h.s. p.r.n.  12. AcipHex 20 mg t.i.d.  13. Zyrtec 10 mg q.a.m.  14. Klor-Con 10 mEq q.a.m.  15. Promethazine 25 mg q.6 h p.r.n.  16. Seroquel 300 mg 3 tabs at bedtime.  17. Effexor XR 150 mg 3 each morning.  18. Nasonex nasal spray 1-2 sprays each morning.  19. Xalatan eye drops 1 drop left eye q.p.m.   LABORATORY DATA:  An INR is 0.9, a PTT is 33. CBC reveals hemoglobin 14.4,  hematocrit 44.1, WBC is 13.3000, platelets 272,000. BUN is 8, creatinine  1.3, potassium 3.6, glucose 96.  REVIEW OF SYSTEMS:  Negative except for the following. She has recently  noted some dyspnea on exertion. This was of gradual onset. She has frequent  headaches and dizziness. She has frequent nausea. This is not new. She  reports that she bruises easily. She reports no other symptoms.   PHYSICAL EXAMINATION:  GENERAL:  Reveals a pleasant 56 year old white female  in no acute distress.  VITAL SIGNS:  Blood pressure 114/76, pulse 87, respirations 16, temperature  96.9, oxygen saturation 100%.  HEENT:  Unremarkable.  NECK:  Reveals no bruits, no jugular venous distention.  HEART:  Reveals regular rate and rhythm without murmur.  LUNGS:  Clear.  ABDOMEN:  Soft.  EXTREMITIES:  Reveals pulses to be intact without edema.  SKIN:  Warm and dry.  Her airway is rated at a 1, her ASA scale is rated at a 3.  NEUROLOGIC:  Mental status:  The patient is alert and oriented and follows  commands. Cranial nerves II-XII are grossly intact. Sensation is intact to  light touch. Motor strength is 4-5/5  throughout. Cerebellar testing is  intact.   IMPRESSION:  1.  History of cerebral aneurysms with previous stenting of a large right      middle cerebral artery aneurysm performed June 19, 2005 with a      residual basilar artery aneurysm.  2.  History of migraine headaches.  3.  Bipolar disorder.  4.  Obsessive compulsive disorder.  5.  Gastroesophageal reflux disease and gastroparesis as well as chronic      abdominal pain and esophageal strictures.  6.  Ongoing tobacco use.  7.  History of seizures and auditory hallucinations.  8.  Status post multiple surgeries.  9.  Allergy to DARVOCET and OXYCONTIN.  10. Elevated white blood cell count of uncertain etiology.   PLAN:  The patient has received aspirin and Plavix as well as nimodipine  this morning. She will also receive Ancef prior to her procedure. Dr.  Corliss Skains will perform the cerebral angiogram with possible further  intervention if felt to be indicated and safe based on the results of the  angiogram.      [name], P.A.    ______________________________  Grandville Silos. Corliss Skains, M.D.    DR/MEDQ  D:  01/01/2006  T:  01/01/2006  Job:  [id number]   cc:   Corwin Levins, M.D. [facility]  [address]  [place]  [place] [address]   Danae Orleans. Venetia Maxon, M.D.  Fax: [phone]   Merita Norton  Fax: [phone]   Lynden Ang, M.D.  Fax: [phone]
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
## 2010-11-02 NOTE — H&P (Signed)
[facility]  Patient:    [name], [name] Visit Number: [id number] MRN: [id number]          Service Type: MED Location: 3W 0351 01 Attending Physician:  Corwin Levins Dictated by:   Corwin Levins, M.D. [facility] Admit Date:  12/03/2001                           History and Physical  CHIEF COMPLAINT:  Recurrent right lower quadrant pain.  HISTORY OF PRESENT ILLNESS:  The patient is a 55 year old white female who complains of pain in the mid to right lower abdomen, some nausea and vomiting several times, and chills.  She had five bowel movements yesterday, otherwise normal, somewhat more loose stool, and "everything goes through me this morning."  She had recent Ceftin and Zithromax.  Vicodin is no help.  Worse lying down.  She is status post TAH and appendectomy.  Abdominal CT January 3, ______ , with 2 cm ovary cyst.  She was admitted for a similar presentation January 2003, with diagnosis of right upper quadrant pain secondary to right hemorrhagic cyst versus right ovarian abscess and possibly diarrhea due to viral gastroenteritis and urinary tract infection.  PAST MEDICAL HISTORY: 1. Bipolar disease and OCD. 2. Migraine. 3. Intracranial aneurysm. 4. GERD with gastroparesis. 5. Esophageal stricture. 6. Chronic abdominal pain.  PAST SURGICAL HISTORY: 1. Status post ______ . 2. Status post TAH.  ALLERGIES:  DARVOCET.  CURRENT MEDICATIONS: 1. Topamax 250 mg p.o. q.d. 2. Ambien 10 mg a.c. and q.h.s. 3. Reglan 40 mg p.o. q.d. 4. Protonix 80 mg p.o. q.d. 5. Neurontin 1200 mg p.o. q.d. 6. Potassium chloride 10 mEq p.o. q.d. 7. Colace 100 mg p.o. q.d.  8. Celexa 40 mg p.o. q.d.  9. Estrogen patch 1 each week. 10. Wellbutrin SR 300 mg p.o. q.d. 11. Risperdal 400 mg q.h.s. 12. Imitrex p.r.n.  SOCIAL HISTORY:  Tobacco, one pack per day.  Alcohol, occasional.  FAMILY HISTORY:  Otherwise estranged.  PHYSICAL EXAMINATION:  GENERAL:  The patient is  a 55 year old white female.  She appears moderately ill.  VITAL SIGNS:  Blood pressure 110/72, respirations 20, pulse 84, temperature 98.4.  Weight 184.  HEENT:  Sclerae clear.  TMs clear.  Pharynx with minor erythema.  NECK:  Without lymphadenopathy, JVD, thyromegaly.  CHEST:  No rales or wheezing.  CARDIAC:  Regular rate and rhythm.  ABDOMEN:  Moderate right lower quadrant tenderness.  Positive bowel sounds. No guarding, no rebound otherwise.  No organomegaly.  No masses.  EXTREMITIES:  No edema.  NEUROLOGIC:  Cranial nerves II-XII intact.  Otherwise nonfocal.  ASSESSMENT AND PLAN:  Abdominal pain right lower quadrant, questionable recurrent ovarian cyst etiology versus gastrointestinal such as right-sided diverticulitis, etc.  She will be admitted, started on IV fluids, given pain medications, Phenergan, IV Unasyn.  Check blood and urine cultures, C. difficile toxin.  Check routine laboratories including LFTs, etc. Abdominopelvic CT with contrast.  Will consider GI consultation. Dictated by:   Corwin Levins, M.D. [facility] Attending Physician:  Corwin Levins DD:  12/03/01 TD:  12/03/01 Job: [id number]

## 2010-11-02 NOTE — H&P (Signed)
NAMEDELIA, [name]                ACCOUNT NO.:  [id number]   MEDICAL RECORD NO.:  [id number]          PATIENT TYPE:  AMB   LOCATION:  SDS                          FACILITY:  [facility]   PHYSICIAN:  Sanjeev K. Deveshwar, M.D.DATE OF BIRTH:  [birth year]   DATE OF ADMISSION:  03/04/2005  DATE OF DISCHARGE:                                HISTORY & PHYSICAL   CHIEF COMPLAINT:  The patient is here for cerebral angiogram today.   HISTORY OF PRESENT ILLNESS:  This is a 55 year old female with a long  history of migraine headaches.  She was found to have a right middle  cerebral artery aneurysm back in 2001.  This has been followed by yearly  MRIs.  The patient recently had a repeat study that was performed on Nov 08, 2004 that showed a 4.8 mm x 4.1 mm saccular aneurysm in the right middle  cerebral artery CVA.  There was also a question of a 3.5 mm x 4 mm aneurysm  arising from the proximal aspect of the right superior cerebellar artery at  its junction with the basilar artery.  The patient apparently saw Dr. Venetia Maxon  and was referred to Dr. Corliss [name] for a cerebral angiogram to further  evaluate these aneurysms.  The patient tells me that she had an angiogram  performed in 2001 in [facility].   PAST MEDICAL HISTORY:  1.  Migraine headaches.  2.  She has bipolar disorder with obsessive compulsive disorder.  3.  She has the above-noted aneurysm history.  4.  She has gastroesophageal reflux disease.  5.  Gastroparesis.  6.  Chronic abdominal pain followed by Dr. Jarold Motto.  7.  She has a history of esophageal strictures.  8.  She has ongoing tobacco use.  9.  Apparently there has also been some recent falls with a question of      seizure history.  She is currently wearing a monitor which was placed by      Dr. Marily Lente office to further evaluate for possible seizures.   PAST SURGICAL HISTORY:  1.  This patient has had a total abdominal hysterectomy.  2.  Shoulder surgery.  3.  Elbow  surgery.  4.  Cholecystectomy.  5.  Appendectomy.  6.  Surgery for a ruptured ovarian cyst.   ALLERGIES:  DARVOCET, OXYCONTIN.  Both of these cause itching.   CURRENT MEDICATIONS:  1.  Effexor 150 mg two daily.  2.  Crestor 20 mg daily.  3.  Lasix 40 mg daily.  4.  Abilify 10 mg at bedtime.  5.  Potassium 10 mEq daily.  6.  Singulair 10 mg at night.  7.  Seroquel 300 mg two daily.  8.  Klonopin 1 mg b.i.d.  9.  Allegra 180 mg daily.  10. Protonix 40 mg b.i.d.  11. Amitiza 24 mcg twice daily.  12. Carafate 1 g one hour before meals.  13. Neurontin 600 mg three tablets daily.  14. Nasonex each morning.  15. Astelin every 12 hours.  16. Xalatan one drop per night in the left eye.  17. She was  recently started on Topamax 25 mg b.i.d. for a question of      seizures.   SOCIAL HISTORY:  Patient is widowed.  She has two children.  She lives in  [place].  One of her sons lives with her.  She has been smoking for at  least 30 years.  She is trying to quit.  She is currently down to six  cigarettes per day.  She denies alcohol use.   FAMILY HISTORY:  Her mother died in her 13s from cancer.  She also had  diabetes and coronary artery disease.  Her father died in his 5s or 17s  from cancer.   REVIEW OF SYSTEMS:  Completely negative except for the following:  She has  frequent migraine headaches.  She has history of gastroparesis and  gastroesophageal reflux disease, and chronic abdominal pain.  She reports  that she bruises easily.  There has been some recent question of seizure  activity.  She has also had some falls and some dizziness.   PHYSICAL EXAMINATION:  GENERAL:  Pleasant 55 year old white female in no  acute distress.  VITAL SIGNS:  Blood pressure 119/81, pulse 109, respirations 20, temperature  97, oxygen saturation 93%, weight 180 pounds.  Her airway was rated at a 2.  Her ASA scale was rated at a 2.  HEENT:  Unremarkable.  Pupils were approximately 4 mm and equal  bilaterally.  NECK:  No bruits.  No jugular venous distention.  HEART:  Regular rate and rhythm without murmur.  LUNGS:  Clear.  ABDOMEN:  Slightly obese, soft, nontender.  EXTREMITIES:  Pulses intact without edema.  SKIN:  Warm and dry.  NEUROLOGIC:  Mental status:  Patient was alert and oriented and followed  commands.  Cranial nerves II-XII were grossly intact.  Sensation was intact  to light touch.  Motor strength was 4-5/5 throughout.  Cerebellar testing  was intact.   LABORATORY DATA:  Pending.   IMPRESSION:  1.  One known cerebral aneurysm with the possibility now existing of a      second aneurysm.  The patient is for a cerebral angiogram today to      further evaluate these findings.  2.  History of bipolar disorder with obsessive compulsive disorder.  3.  History of migraines.  4.  History of gastroparesis, esophageal stricture, and chronic abdominal      pain.  5.  Ongoing tobacco history.  6.  Question of seizure activity.  7.  Recent dizziness and falls.  8.  Status post multiple surgeries as noted above.  9.  Reported allergies to Darvocet and OxyContin.      [name], P.A.    ______________________________  [name]. [name], M.D.    DR/MEDQ  D:  03/04/2005  T:  03/04/2005  Job:  [id number]   cc:   Ala Dach, M.D.  [place]   Corwin Levins, M.D. [facility]  [address]  [place]  [place] [address]   Sharyon Medicus, M.D.

## 2010-11-02 NOTE — Assessment & Plan Note (Signed)
[facility]                         GASTROENTEROLOGY OFFICE NOTE   NAME:Chawla, MICHOL EMORY                       MRN:          [id number]  DATE:08/28/2006                            DOB:          1955/07/08    [name] is doing better since her multiple medications for her bipolar  disorder have been adjusted.  She sees Dr. Wynonia Lawman.   She is on Lyrica 150 mg twice a day, Wellbutrin XL 150 mg a day,  Lamictal 200 mg twice a day, Abilify 30 mg a day, and has been able to  get off her Klonopin.  She sees Dr. Jonny Ruiz for primary care, is on Topamax  100 mg twice a day, Crestor 20 mg a day, potassium replacement,  Singulair one a day, Allegra 180 mg a day.  She is taking AcipHex 20 mg  t.i.d., 81 mg of aspirin a day.   She has had stents placed in her cerebral area because of her history of  brain aneurysms.  She is on chronic aspirin, but not on Coumadin.  Her  acid reflux is doing well and on recent endoscopy a year ago, she had no  evidence of Barrett's on biopsies.   She continues to be constipated, having to use MiraLax and Correctol  tablets, but she only eats once a day, because she forgets to eat.  I  have advised her that she may not go to the bathroom every day with this  regimen.  In any case, she otherwise really denies GI complaints, is  having no nausea or vomiting, melena or hematochezia, but is due for  colon followup with previous colon followup noted three and a half years  ago.   She weighs 158 pounds, which is down some 30 pounds from a year ago.  Blood pressure is 110/64 and pulse was 88.  General physical exam was  not completed.  She is oriented times three.   RECOMMENDATIONS:  1. I have asked the patient to eat three smaller frequent meals during      the day and to use fiber supplements and liberal p.o. fluids for      her constipation.  2. Continue constipation regimen.  She has tried Amitiza in the past      without relief.  3.  Colonoscopy scheduled in one month's time on salicylate therapy.     Vania Rea. Jarold Motto, MD, Caleen Essex, FAGA  Electronically Signed    DRP/MedQ  DD: 08/28/2006  DT: 08/29/2006  Job #: [id number]   cc:   Corwin Levins, MD  Lynden Ang, M.D.

## 2010-11-02 NOTE — Consult Note (Signed)
[facility]  Patient:    [name], [name] Visit Number: [id number] MRN: [id number]          Service Type: EMS Location: ED Attending Physician:  Lorre Nick Dictated by:   Sandria Bales. Ezzard Standing, M.D. Admit Date:  06/25/2001   CC:         Dr. Titus Dubin. Alwyn Ren, [facility]  Dr. Corwin Levins, [facility]  Dr. Vania Rea. Jarold Motto, [facility]  Dr. Mauro Kaufmann, [facility], [place], [place]  Dr. Gaynell Face, [facility], [place], [place]   Consultation Report  DATE OF BIRTH:  06/23/1955  REASON FOR CONSULTATION:  Right-sided abdominal pain, possible diverticular abscess.  HISTORY OF ILLNESS:  This is an unusual 55 year old white female who for some two to three weeks has had some very vague abdominal pain which she has kind of had trouble describing.  She apparently suffers from a bipolar disorder and obsessive-compulsive disorder and she has gotten off her medicines over the last week or two, which I think has made her giving an accurate history somewhat difficult.  She is married but not accompanied by her husband, who is at home; she is accompanied by her son.  Over the last two to three days, she has had worsening abdominal pain.  She actually drove herself to the emergency room today.  She said she had to pull over on the side of the road a couple of times because of her belly pain.  She describes the pain as being on the right side.  She has been able to eat fairly well, although she suffers from "gastroparesis," initially evaluated by Dr. Vania Rea. Jarold Motto, but referred up to Dr. Dorita Fray at [facility] for this chronic gastroparesis.  She also has quite a "slow colon."  Her prior abdominal operations have included a tubal ligation in 1984.  She had an appendectomy and ovarian cyst removal at [facility] in about 1994.  She had a laparoscopic cholecystectomy by Dr. Molli Hazard B. Daphine Deutscher about 1998.  She has chronic reflux and again this "paresis"  of her bowels.  ALLERGIES:  She is allergic to DARVOCET, which makes her itch.  CURRENT MEDICATIONS:  She provides a list of medicines, though again she has not been taking this for at least a week or longer; this includes:  1. Celexa 80 mg daily.  2. Topamax 250 mg daily.  3. Seroquel 1400 mg daily.  4. Reglan 40 mg daily.  5. Aciphex 40 mg daily.  6. Ambien 20 mg at night.  7. Neurontin 1200 mg daily.  8. Miralax 17 g per day.  9. Lasix 40 mg daily. 10. Potassium. 11. She has some medicines for migraine headaches including Axert, Imitrex,     Ultram, though she has not really had these recently that I can tell, at     least by her history.  Again, I think her history is somewhat limited; at least half her history I got from her son and half from her chart.  REVIEW OF SYSTEMS:  NEUROLOGIC:  Again, she has bipolar disorder and obsessive-compulsive and depression and she is seen over in [place] at [facility] by a Dr. Gaynell Face; she thinks is Dr. Sissy Hoff.  She has never had a stroke or a seizure.  She also has a "cerebral or brain aneurysm" and sees a Dr. Ala Dach in [facility] but has never had a bleed from this that I can tell, nor has had a stroke.  PULMONARY:  She smokes cigarettes and knows it is bad for her health.  CARDIAC:  She apparently has had a recent stress test by Dr. Corwin Levins because of some "swelling in her legs."  This was a treadmill that apparently she passed, as best she knows.  She complains of no chronic chest pain or further cardiac evaluation.  GASTROINTESTINAL:  See history of present illness.  UROLOGIC:  She has had trouble with urinating with her bladder and emptying her bladder and has seen Dr. Windy Fast L. Davis III on and off; actually, she thinks she is supposed to see him in the next week or two.  GYNECOLOGIC:  She is a gravida 2, para 2, with her last menstrual period being about the 22nd of December.  Again, she is unemployed.  She  says she does not like to leave the house and again has been off her medicines for some time.  PHYSICAL EXAMINATION:  VITAL SIGNS:  Her temperature is 97.6, respirations 20, blood pressure 125/71, pulse of 109.  GENERAL:  She is an alert, somewhat confused female.  HEENT:  Unremarkable.  NECK:  Supple without mass and without thyromegaly.  LUNGS:  Clear to auscultation.  HEART:  Regular rate and rhythm.  BREASTS:  Symmetrical.  I can feel no mass.  She has had no prior history of breast biopsies or nipple discharge.  ABDOMEN:  She has decreased bowel sounds, sort of a vague right-sided abdominal tenderness which is not well-localized.  PELVIC:  I did not do a pelvic exam on her.  EXTREMITIES:  She has good strength in all four extremities.  NEUROLOGIC:  Grossly intact.  LABORATORY DATA:  Her labs show a urinalysis which is basically negative. Urine pregnancy was negative.  Her white blood count is 9900 with 83% neutrophils, a hemoglobin of 13, hematocrit of 40 and platelet count of 273,000.  She first had an ultrasound which showed a mass area in her right pelvis and an area of tenderness.  A CT scan shows maybe a 3-cm mass which is sort of around her right ovarian area, kind of near her sigmoid colon, though I am not sure it is really typical of an abscess and it did not have much stranding around it.  She has really minimal diverticulosis that can be seen by CT scan.  IMPRESSION:  1. Possible diverticular abscess along with right-sided abdominal pain.  Best     course is intravenous antibiotics, n.p.o. except for maybe ice chips and     medicines and try to have radiology percutaneously drain this in the      morning to either identify pus in this or to get clear fluid which will     then rule out an abscess.  2. Chronic gastrointestinal complaints of gastroparesis, slow bowel motility,     seen through [facility] and Dr. Sheryn Bison in town.  3. History of  multiple mental disorders including a bipolar disorder,     depression, obsessive-compulsive disorder, seen by Dr. Gaynell Face in     [place].  4. "Migraine headaches and brain aneurysm."  5. Noncompliance with current medications. Dictated by:   Sandria Bales. Ezzard Standing, M.D. Attending Physician:  Lorre Nick DD:  06/25/01 TD:  06/26/01 Job: [id number]

## 2010-11-02 NOTE — Op Note (Signed)
. [facility]  Patient:    [name], [name]                         MRN: [id number] Proc. Date: 10/20/00 Adm. Date:  [id number] Attending:  Alinda Deem                           Operative Report  PREOPERATIVE DIAGNOSIS:  Left shoulder impingement syndrome, acromioclavicular joint arthritis.  Possible rotator cuff tear, possible labral tear.  POSTOPERATIVE DIAGNOSIS:  Left shoulder impingement syndrome, acromioclavicular joint arthritis and superior labral tear.  OPERATION PERFORMED:  Left shoulder arthroscopic anterior-inferior acromioplasty, distal clavicle excision and debridement of labral tear.  SURGEON:  Alinda Deem, M.D.  ASSISTANT:  Dorthula Matas, P.A.-C.  ANESTHESIA:  Interscalene block with general endotracheal.  ESTIMATED BLOOD LOSS:  Minimal.  FLUID REPLACEMENT:  800 cc of crystalloid.  DRAINS:  None.  TOURNIQUET TIME:  None.  INDICATIONS FOR PROCEDURE:  The patient is a 55 year old woman followed for left shoulder impingement syndrome for many months, who has failed conservative treatment up to and including differential cortisone injections into the subacromial space and AC joint that gave a total of about 90% pain relief for only a few days.  Because of persistent recurrent pain, arthroscopic evaluation and treatment of the left shoulder has been recommended and consented to by the patient.  Risks and benefits of surgery were understood.  DESCRIPTION OF PROCEDURE:  The patient was identified by arm band and taken to the operating room at [facility] where the appropriate anesthetic monitors were attached and interscalene block anesthesia was induced into the left upper extremity in the supine position.  She then underwent general endotracheal anesthesia and was placed in the beach chair position and the left upper extremity prepped and draped in the usual sterile fashion from the wrist to the  hemithorax.  The skin along the anterolateral and posterior aspects of the acromion process was then infiltrated with 15 cc of 0.5% Marcaine with epinephrine solution and standard portals were then made with a #11 blade 1.5 cm anterior to the [facility] joint, lateral to the junction of the middle and posterior thirds of the acromion, posterior to the posterolateral corner of the acromion process.  The arthroscope was introduced through the lateral portal.  A 4.2 mm [place] sucker shaver through the posterior portal and the inflow through the anterior portal.  Subacromial bursectomy was then accomplished with 2+ inflammation noted.  The subacromial spur was then outlined and removed with a 4.5 mm hooded Vortex bur.  This better exposed the acromioclavicular joint and the distal clavicle did have an inferior, distal and posterior spur that was noted to be impinging on the supraspinatus muscle. A distal clavulectomy was then accomplished with a 4.5 mm hooded Vortex bur from the posterior and then the anterior portal with the arthroscope moved to the posterior portal during the anterior resection of the distal centimeter. We then carefully evaluated the rotator cuff externally, having completed our decompression and found no significant tearing.  The arthroscope was then introduced into the glenohumeral joint using the posterior portal at which time we immediately identified a superior degenerative labral tear that was debrided back to stable margins.  The glenohumeral joint had grade 1 chondromalacia, the rotator cuff including the infraspinatus, supraspinatus and subscapularis was noted to be intact.  The rest of the labrum was  probed and found to be stable.  At this point the shoulder was washed out with normal saline solution, the arthroscopic instruments removed.  A dressing of Xeroform, 4 x 4 dressing sponges and paper tape was applied.  The patient was then awakened and taken to the recovery  room without difficulty. DD:  10/20/00 TD:  10/20/00 Job: [id number]

## 2010-11-02 NOTE — Group Therapy Note (Signed)
[name] is a 55 year old woman who has a long history of abdominal pain.  She has had a workup which has included GI endoscopy and colonoscopy.  August 17, 2003, was her endoscopy which showed normal esophagus, normal stomach,  normal pyloris, and duodenum and a small hiatal hernia.  She had a gastric  empty study recently which showed no evidence of reflux and normal gastric  emptying.  She had been treated by Dr. Lew Dawes in the past and had been on  OxyContin.  She has also been on Ultram and Vicodin in the past.  On January 03, 2002, she had a normal thoracic MRI.  On December 31, 2001, she had a  negative lumbar MRI.  She describes that her pain in the right lower  quadrant is about an 8 on a scale of 10, but it can wane down to a 4.  It  does wax and wane in intensity.  It is sharper when she is doing activities,  such as vacuuming and walking.  Better when she is curled up and takes pain  medications.  She has been on disability since 2002 for aneurysm and  migraines.  She reports she does have a history of hiatal hernia.  She  denies any problems with ulcers, hepatitis, pancreatitis, liver disease,  kidney disease, kidney stones, or renal failure.  She is also being treated  for depression and is on several medications for that.   She is quite functional, however.  She does the cooking, cleaning, and  washing.  She lives with her husband and her son.  Her son does the grocery  shopping.  She is independent with all of her ADLs and is able to walk 20  minutes two to three times a week.   She reports her mood has been okay.  She does report a history of depression  and feels it is well controlled currently.   SOCIAL HISTORY:  The patient has been married 25 years.  Her husband  currently has been diagnosed with non-Hodgkin's lymphoma.  He has been  undergoing some treatment for that and may be getting a bone marrow  transplant.  She has been busy taking him to medical appointments.  Her  son  lives with them.  He is 39 years old and works 12-hour shifts.  Ms. Braxton  denied alcohol use.  She does smoke a packs of cigarettes a day.   FAMILY HISTORY:  Remarkable for a mother who died at age 29.  She had a  history of diabetes, brain cancer, and heart disease.  Her father died in  his 33s, etiology unknown.   ALLERGIES:  The patient reports an allergy to DARVOCET.  She gets some  itchiness with hit.  When asked specifically whether she has a penicillin  allergy, she says no.   MEDICATIONS:  Medications at this time include the following:  1. Protonix 40 mg one p.o. b.i.d.  2. Klor-Con 10 mEq one daily.  3. Zyrtec 10 mg daily.  4. Lasix 40 mg one p.o. b.i.d.  5. Neurontin.  She takes a total of 2100 mg per day, 600 mg in the morning,     600 mg in the afternoon, and 900 mg at night.  6. Effexor XR 150 mg one daily.  7. Topamax 25 mg b.i.d.  8. Risperdal 4 mg daily.  9. Topamax 100 mg p.o. b.i.d.   She is currently off hydrocodone and OxyContin.   PAST MEDICAL HISTORY:  Remarkable for:  1. Hiatal hernia.  2. Reflux.  3. History of chronic abdominal pain.  4. History of fibromyalgia.  5. History of an aneurysm.  6. Migraines.  7. Depression.   PAST SURGICAL HISTORY:  Remarkable for:  1. Hysterectomy.  2. Appendectomy.  3. Cholecystectomy.  4. Ovarian cyst surgery x 2.  5. Shoulder surgery.  6. Elbow tendonitis surgery.  7. Removal of abdominal adhesions x 1.   PHYSICAL EXAMINATION:  VITAL SIGNS:  The blood pressure is 94/50, pulse 80,  respirations 16, and she is 98% saturated on room air.  GENERAL APPEARANCE:  She is a well-developed, well-nourished woman in no  apparent distress with a blunted affect.  She was appropriate and  cooperative, however.  SKIN:  Remarkable over the abdomen for multiple stretch marks and a scar  across the lower abdomen.  HEENT:  Unremarkable.  NECK:  Notable for diminished range of motion with rotation right and left,  as  well as right and left lateral flexion.  HEART:  Regular rhythm.  LUNGS:  Clear.  ABDOMEN:  Obese and soft.  The patient reported tenderness in the right  lower quadrant.  Bowel sounds were positive.  Tenderness was noted in the  right lower quadrant.  No masses noted.  No hepatosplenomegaly noted.  NEUROLOGIC:  Gait was normal.  Reflexes were 2+ at the knees and ankles  bilaterally.  Toes were downgoing.  No clonus noted.  Sensation was not  tested today.  Motor strength was not tested today.  Lumbar range of motion  was mildly limited with forward flexion and lateral flexion.  Extension did  not seem to bother her and she had approximately 20 degrees of extension.   IMPRESSION:  Right lower quadrant chronic abdominal pain with history of  adhesions.   PLAN:  Will give her a trial of some Lidoderm to apply to her right lower  quadrant area and over the scar.  Will also consider physical therapy for  desensitization.  At this time, the patient reports that she would be unable  to participate in therapy due to her husband's illness and medical needs.  Will see her back in a month and try to get her involved in the  desensitization program at that time.  No other medications are dispensed  other than Lidoderm 5% applied as directed, #10.  She can call in if she  needs refills.     Brantley Stage, M.D.   DMK/MedQ  D:  08/31/2003 15:10:50  T:  08/31/2003 20:12:46  Job #:  [id number]   cc:   Corwin Levins, M.D. [facility]

## 2010-11-02 NOTE — Discharge Summary (Signed)
[facility]  Patient:    [name], [name] Visit Number: [id number] MRN: [id number]          Service Type: MED Location: [phone] Attending Physician:  Dolores Patty Dictated by:   Claretta Fraise, M.D. [facility] Admit Date:  06/25/2001 Discharge Date: 06/29/2001   CC:         Corwin Levins, M.D. [facility], [facility] office  Rudean Curt, M.D., [facility], [place],  Phone #[phone]  Sandria Bales. Ezzard Standing, M.D.  Vania Rea. Jarold Motto, M.D. [facility]   Discharge Summary  DISCHARGE DIAGNOSES:  1. Right lower quadrant pain secondary to right hemorrhagic cyst versus right     ovarian abscess.  2. Chronic abdominal pains with history of gastroparesis and, per patient,     slow gut.  3. History of bipolar disorder.  4. History of obsessive-compulsive disorder.  5. History of migraine headaches.  6. History of brain aneurysm.  7. Diarrhea, most likely viral gastroenteritis.  8. Urinary tract infection.  DISCHARGE MEDICATIONS:  1. ______ 25 mg p.o. b.i.d.  This is to be increased by her psychiatrist.  2. Neurontin 400 mg p.o. t.i.d.  3. Celebrex 200 mg p.o. b.i.d. with food for another seven days.  4. Reglan 10 mg p.o. q.a.c. and q.h.s.  5. Multivitamin with minerals one a day.  6. K-Dur 10 mEq one p.o. b.i.d. x 3 days, and then back to her usual     one-a-day dose.  7. Celexa 80 mg p.o. q.d.  8. Aciphex 40 mg p.o. q.d.  9. Cipro 500 mg p.o. b.i.d. x 7 more days, the last dose being July 05, 2001. 10. Phenergan 12.5 mg one p.o. q.4-6h. p.r.n. or one per rectum q.4-6h. if she     is not tolerating the tablet. 11. Imodium over-the-counter as needed.  She is to follow the package insert. 12. Patient is to continue Ambien 10 mg p.o. q.h.s. p.r.n. 13. She has not been started back on her Seroquel.  There was a concern of     whether she had increased her dose on her own.  She had held all of her     medications for a week and we  have not restarted her back on the Seroquel.     This is to be restarted back by her psychiatrist.  DISCHARGE ACTIVITY:  As tolerated.  DISCHARGE DIET:  As she has been previously doing a liquid diet and abstaining from certain vegetables, fruits, and meats.  FOLLOW-UP DATES:  She is to follow up with her gynecologist, I believe it is Dr. Arville Care, in [place], and she is to have a repeat pelvic ultrasound in eight to 10 weeks as was recommended by the consulting gynecologist here, Dr. Galen Daft.  Patient is also to follow up with Dr. Oliver Barre in one to two weeks.  She is also to follow up with her psychiatrist, Dr. Maurine Simmering at [facility], [place].  HOSPITAL COURSE:  This 55 year old female patient with chronic abdominal pains, with history of gastroparesis followed by Dr. Terrial Rhodes, was admitted by Dr. Alwyn Ren on January 9 with complaints of right lower quadrant pain of several weeks duration associated with chills and fever.  When Dr. Alwyn Ren asked her how she was treating her symptoms, she started crying and she said she was crying and not eating.  For unexplained reasons, she stopped all of her medications, many of which are psychotropics, one week prior to her  admission.  At the time of admission, she described the pain as mild with no significant radiation.  She also had some anorexia in addition to nausea. Patient was admitted and placed on IV fluids and initially kept on n.p.o.  She had general surgery consult the patient for concerns of whether it could be diverticular abscess.  On pelvic ultrasound and CT, there was some question of whether it could be a diverticular abscess.  But, on further review, the radiologist felt that it was more likely an ovarian cyst, whether it was a complex cyst versus an ovarian abscess.  Subsequently, a gynecology consult was obtained and Dr. Galen Daft came and saw the patient and, clearly, it seemed more like a hemorrhagic cyst.   He had recommended that patient be switched to p.o. antibiotics and complete a course of that.  She does need a repeat ultrasound in 8 to 10 weeks to see if there is resolution of this ovarian cyst versus abscess.  Patients pain has kind of waxed and waned.  It is hard to tell sometimes.  Today, she says she is feeling okay and would like to go home and she can manage the pain as an outpatient.  White count initially was never elevated with a total white count of 9.9, H&H stable at 13.5 and 40.2.  Her repeat white count was also normal with a total white count of 8, with an H&H of 12.7 and 37.2.  As part of her workup for her abdominal pain, a UA was obtained which suggested trace leukocytes ______  on micro showed 3 to 6 WBCs.  A urine culture was obtained; however, the urine culture has remained negative.  She was started initially on cefoxitin and gentamicin which were subsequently switched to Cipro on January 11 and she is to complete this for another seven days primarily for her hemorrhagic cyst versus ovarian abscess.  The urine culture has come back negative.  She reported some complaints of diarrhea during this admission although it is unusual to have C. difficile develop within 24 hours of having antibiotics started.  This was done and that was negative.  She also had fecal stool studies done and that was negative.  Her O&P and culture are still pending, but I expect them to be negative, too.  Patient at the time of discharge reports that her diarrhea has lessened with the use of Imodium AD and having just t.i.d. bananas to thicken her stool up.  I did obtain repeat electrolytes and, on the day of discharge, her potassium was 3.4.  She was given additional  supplementation and will have her increase her potassium that she normally takes 10 q.d. to 10 b.i.d. for the next three days.  I expect that should resolve her problem.  In regards to her neuropsychiatry problems, the  patient for whatever reason had stopped all of her psychotropic medications.  She did not elaborate on the reasons for this.  However, these, with the exception of Seroquel, were started back.  A psychiatry consult was called and Dr. Jeanie Sewer had come in to see her and he had recommended the ______ be restarted back, also, and increasing it by 50 every week.  However, the increments and changes can be made by her psychiatrist at the time of follow-up with her.  Currently, her ______ is 25 p.o. b.i.d.  She was restarted back on the Celexa and also her Neurontin.  The only medication that is still being held is the Seroquel and  this can be started back by her psychiatrist.  Per report, she apparently was taking Seroquel 1400 mg total per day up until one week prior to her admission.  In regards to laboratory workup, as mentioned earlier, on her initial CBC, her white count was 9.9 with an H&H of 13.5 and 40.2, platelet count of 273,000. Differential - PMNs of 83, lymphocytes of 11.  Her initial electrolytes showed a sodium of 138, potassium of 3.5, chloride and CO2 of 112 and 22, BUN and creatinine 5 and 1, glucose 97, calcium 8.4, and as mentioned earlier, at the time of discharge, her electrolytes were fairly unremarkable except for a potassium of 3.4 and this was adequately replaced.  As mentioned earlier, also, the CT scan of her abdomen and pelvis showed that she is status post cholecystectomy and appendectomy, showed no signs of diverticular disease, but it raised a question of whether she had diverticular abscess in her right lower quadrant versus an ovarian cyst.  A pelvic ultrasound was obtained and this was more consistent with an ovarian complex cyst versus ovarian abscess.  CONSULTANTS:  Dr. Ezzard Standing from general surgery, Dr. Jeanie Sewer from psychiatry, and Dr. Galen Daft from gynecology.  Patient is being discharged today in stable condition with follow-up as previously  mentioned. Dictated by:   Claretta Fraise, M.D. [facility] Attending Physician:  Dolores Patty DD:  06/29/01 TD:  06/29/01 Job: [phone]

## 2010-11-02 NOTE — Discharge Summary (Signed)
[facility]  Patient:    [name], [name] Visit Number: [id number] MRN: [id number]          Service Type: MED Location: [phone] Attending Physician:  Corwin Levins Dictated by:   Sonda Primes, M.D. [facility] Admit Date:  12/03/2001 Discharge Date: 12/08/2001   CC:         Hedwig Morton. Juanda Chance, M.D. [facility]   Discharge Summary  DATE OF BIRTH:  December 11, 1955  DISCHARGE DIAGNOSES: 1. Right lower quadrant abdominal pain of unclear etiology. 2. Colitis on the computed axial tomography scan, not confirmed by    colonoscopy.  CONSULTATIONS:  Dr. Lina Sar, Gastroenterology.  PROCEDURE: 1. Colonoscopy on December 07, 2001. 2. Polypectomy.  X-RAY STUDIES:  CT of the abdomen and small bowel follow through.  HISTORY:  The patient is a 55 year old female with recurrent right lower quadrant abdominal pain who was admitted by Dr. Jonny Ruiz on December 03, 2001. For the details please address to his history and physical.  HOSPITAL COURSE:  During the course of hospitalization, she was treated with IV fluids and pain medications.  She underwent a colonoscopy which showed some polyps which were removed.  On the day of discharge, she underwent small bowel follow through and was discharged in stable condition.  PHYSICAL EXAMINATION:  VITAL SIGNS: Her temperature was 97.2, heart rate 73, blood pressure 102/58.  HEENT:  Moist.  SKIN:  Clear.  LUNGS:  Clear.  HEART:  Regular S1 and S2.  ABDOMEN:  Soft, slight tender in the right lower quadrant.  No rebound symptoms.  No masses felt.  LABORATORY DATA:  White count 7.6, hemoglobin 11.8 - 12.7, platelet count normal.  Sedimentation rate 21.  Potassium 3.5, creatinine 1.0. Urinalysis normal.  Stool for ova and parasites negative.  Abdominal pelvic CT scan with probable 2 x 1.6 right renal nodule, probably representing and adenoma.  Minimal thickening of the terminal ileal wall at the ileocecal valve nonspecific.  Small  bowel follow through revealed no abnormality.  Colonoscopy with two colon polyps removed. Dictated by:   Sonda Primes, M.D. [facility] Attending Physician:  Corwin Levins DD:  01/06/02 TD:  01/06/02 Job: [id number]

## 2010-11-02 NOTE — Consult Note (Signed)
NAMESHARONDA, [name] NO.:  [id number]   MEDICAL RECORD NO.:  [id number]          PATIENT TYPE:  OUT   LOCATION:  XRAY                         FACILITY:  [facility]   PHYSICIAN:  [name], P.A.   DATE OF BIRTH:  07-07-1955   DATE OF CONSULTATION:  07/03/2005  DATE OF DISCHARGE:                                   CONSULTATION   BRIEF HISTORY:  This is a pleasant 55 year old female who was found to have  multiple aneurysms by an MRA performed in 2001.  She was recently referred  to Dr. Corliss Skains due to a change in one of the aneurysms.  On June 19, 2005, the patient underwent stenting of the larger right middle cerebral  artery aneurysm.  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  The patient now returns to be seen  in followup approximately 2 weeks following her procedure.   PAST MEDICAL HISTORY:  Significant for:  1.  Migraine headaches,  2.  Bipolar disorder.  3.  Obsessive-compulsive disorder.  4.  Gastroesophageal reflux disease.  5.  Gastroparesis.  6.  Chronic abdominal pain.  7.  Esophageal strictures.  8.  Ongoing tobacco use.  9.  History of seizures.  10. History of auditory hallucinations.  11. The above-noted aneurysms.   PAST SURGICAL HISTORY:  Significant for:  1.  Total abdominal hysterectomy.  2.  Shoulder surgery.  3.  Elbow surgery.  4.  Cholecystectomy.  5.  Appendectomy.  6.  Surgery for a ruptured ovarian cyst.   ALLERGIES:  DARVOCET and OXYCONTIN.   SOCIAL HISTORY:  The patient is a widow.  She lost her husband in February  2006.  She has been depressed since that time.  She has 2 children.  She  lives in [place].  Her son lives with her.  She continues to smoke.  She has been trying to quit.  She denies alcohol use.   FAMILY HISTORY:  Her mother died in her 57s from cancer.  She also had  diabetes and coronary artery disease.  Her father died in his 21s or 41s  from cancer.   IMPRESSION AND PLAN:  The patient is doing well following the stent  placement which covered 2 aneurysms in the right middle cerebral artery.  This was to be stage 1 with a multi-stage procedure.  Dr. Corliss Skains plans to  go back in approximately 3 months in the original stenting to place coils in  the larger of the right middle cerebral artery aneurysms and to further  evaluate the smaller of these 2 aneurysms.  At some point, she will also  require coiling or stenting of the basilar artery aneurysm as well.   The patient reports that she has occasion shooting pains in the right side  of her head which last a few seconds but occur in clusters which last up to  30 minutes at a time.  She has occasional dizziness.  She also reports  that  she has had a lot of bruising on the Plavix and aspirin combination.  The  plan was to treat her with Plavix for 2 weeks.  She has 3 days worth of  Plavix left.  Dr. Corliss Skains told her to take Plavix tomorrow and then stop  the Plavix and to keep the 2 additional doses of Plavix for possible future  use.  He recommended she continue on the aspirin 325 mg per day, although  this could be decreased to 81 mg per day if she continues to have problems  with bruising.   The patient continues to smoke.  She is trying to quit.  She knows that this  is a risk factor for recannulization of the aneurysms.  The patient also  asked about an MRI or MRA with her current stent.  There is no  contraindication with this stent to having an MRI or an MRA.   As noted, we plan to see the patient back in approximately 2-1/2 months for  further treatment of the right middle cerebral artery aneurysms and further  evaluation of the residual basilar aneurysm.   Greater than 30 minutes was spent on this consult.      [name], P.A.     DR/MEDQ  D:  07/03/2005  T:  07/03/2005   Job:  [id number]   cc:   Corwin Levins, M.D. [facility]  [address]  [place]  [place] [address]   Danae Orleans. Venetia Maxon, M.D.  Fax: [phone]   Dr. Ala Dach, [facility], M.D.  Fax: [phone]

## 2010-11-02 NOTE — Op Note (Signed)
[name], [name] NO.:  [id number]   MEDICAL RECORD NO.:  [id number]          PATIENT TYPE:  INP   LOCATION:  3009                         FACILITY:  [facility]   PHYSICIAN:  Danae Orleans. Venetia Maxon, M.D.  DATE OF BIRTH:  1956-02-01   DATE OF PROCEDURE:  11/30/2004  DATE OF DISCHARGE:                                 OPERATIVE REPORT   PREOPERATIVE DIAGNOSIS:  Herniated cervical disk with cervical spondylosis  and degenerative disk disease and cervical radiculopathy, C5-6 and C6-7  levels.   POSTOPERATIVE DIAGNOSIS:  Herniated cervical disk with cervical spondylosis  and degenerative disk disease and cervical radiculopathy, C5-6 and C6-7  levels.   PROCEDURE:  Anterior cervical decompression and fusion, C5-6 and C6-7 levels  with PEEK interbody spacers with morcellized bone autograft and anterior  cervical plate, C5 through C7 levels.   SURGEON:  Danae Orleans. Venetia Maxon, M.D.   ASSISTANT:  Hewitt Shorts, M.D.   ANESTHESIA:  General endotracheal anesthesia.   ESTIMATED BLOOD LOSS:  Minimal.   COMPLICATIONS:  None.   DISPOSITION:  To recovery.   INDICATIONS:  [name] is a 55 year old woman with significant  protracted neck and bilateral upper extremity pain, left greater than right.  She has spondylosis and disk space collapse at the C5-6 and C6-7 levels.  It  was elected to take her to surgery for anterior cervical decompression and  fusion after she did not improve with a concerted effort with physical  therapy and conservative measures.   PROCEDURE:  [name] was brought to the operating room.  Following the  satisfactory and uncomplicated induction of general endotracheal anesthesia  and placement of intravenous lines, the patient was placed in the supine  position on the operating table.  Her neck was placed in slight extension  and she was placed in 10 pounds of Holter traction.  Her anterior neck was  then prepped and draped in the usual sterile  fashion.  The area of planned  incision was infiltrated with 0.25% Marcaine and 0.5% lidocaine and  1:200,000 epinephrine.  An incision was made from the midline to the  anterior border of the sternocleidomastoid muscle, carried sharply through  the platysmal layer.  Subplatysmal dissection was performed exposing the  anterior border of the sternocleidomastoid muscle.  Using blunt dissection,  the carotid sheath was kept lateral and the trachea and esophagus kept  medial, exposing the anterior cervical spine.  A bent spinal needle was  placed at what was felt to the C5-6 level and confirmed on intraoperative x-  ray.  Subsequently using electrocautery and Key elevator, the longus colli  muscles were taken down from the anterior cervical spine from the C5 through  C7 levels and the self-retaining Shadow Line retractor was placed along with  an up-down retractor.  The large ventral osteophytes were removed with the  Leksell rongeur, particularly at the C6-7 level.  Both interspaces were then  incised with a 15 blade and disk material was removed in a piecemeal  fashion.  Both disk spaces appeared to be collapsed and degenerated.  A disk  space spreader was placed to facilitate exposure.  The microscope was  brought into the field and using microscopic visualization, further denuding  of the cartilaginous end plates was performed with removal of the disk  material, initially at the C6-7 level, and using a high-speed drill, the end  plates were decorticated and uncinate spurs were drilled down.  The soft  bone dust was then retained for later placement within the PEEK cage as bone  autograft material.  The posterior longitudinal ligament was incised and  removed with 2 and 3 mm gold-tipped Kerrison rongeurs and both C7 nerve  roots were decompressed as they extended up the neural foramina.  Hemostasis  was ensured with Gelfoam soaked in thrombin.  After trial sizing, a 7 mm  PEEK interbody  spacer was then selected, packed with morcellized bone  autograft, inserted in the interspace and countersunk appropriately.  Attention was then turned to the C5-6 level, where a similar decompression  was performed at this level.  Disk material was removed and the uncinate  spurs were drilled down.  Again bone graft was preserved.  The posterior  longitudinal ligament was removed and both C6 nerve roots were decompressed.  At this level there was significant spondylitic bridging, particularly on  the left, which was causing significant compression of the C6 nerve root.  Again a 7 mm PEEK interbody cage was selected and packed with morcellized  bone autograft, inserted in the interspace and countersunk appropriately.  After trial sizing, a 34 mm anterior cervical plate was selected and affixed  to the anterior cervical spine using 14 mm variable-angled screws, two at  C5, two at C6 and two at C7.  All screws had excellent purchase.  The  locking mechanisms were engaged.  The final x-ray demonstrated well-  positioned interbody graft.  Prior to placing the plate, the Holter traction  was removed.  The wound was then copiously irrigated with bacitracin and  saline.  There was no evidence of any bleeding, and soft tissues were  inspected and found to be in good repair.  The platysmal layer was closed  with 3-0 Vicryl sutures and the skin edges were reapproximated with  interrupted 3-0 Vicryl subcuticular stitch, and the wound was closed with  Dermabond.  The patient was placed in an Aspen collar, extubated in the  operating room and taken to the recovery room in stable and satisfactory  condition, having tolerated her operation well.  Counts were correct at the  end of the case.       JDS/MEDQ  D:  11/30/2004  T:  12/01/2004  Job:  [id number]

## 2010-11-02 NOTE — Procedures (Signed)
[facility]  Patient:    [name], [name] Visit Number: [id number] MRN: [id number]          Service Type: MED Location: 3W [phone] Attending Physician:  Corwin Levins Dictated by:   Hedwig Morton. Juanda Chance, M.D. [facility] Proc. Date: 12/07/01 Admit Date:  12/03/2001   CC:         Corwin Levins, M.D. [facility]   Procedure Report  PROCEDURE:  Colonoscopy.  SURGEON:  Hedwig Morton. Juanda Chance, M.D.  INDICATIONS:  This 55 year old white female was admitted with severe right lower quadrant abdominal pain localized anteriorly.  A CT scan of the abdomen was normal, but showed some question of thickening of the ileum.  Her stool was Hemoccult negative.  She was mildly anemic, but her sedimentation rate was normal.  She is now undergoing colonoscopy to her right colon.  The patient had a previous appendectomy and a total abdominal hysterectomy.  ENDOSCOPE:  Olympus single channel videoscope.  SEDATION:  Versed 10 mg IV and Demerol 100 mg IV.  FINDINGS:  The Olympus single channel video endoscope was passed under direct vision from the rectum to the sigmoid colon.  The patient was monitored by pulse oximeter and oxygen saturations were normal.  Her prep was excellent. Anal canal showed small diminutive polyp in the rectal ampulla, measuring about 3 mm, and it was ablated with hot biopsies.  At the level of 25 cm above the rectum was a 5-6 mm sessile polyp which was also ablated with hot biopsies and sent to pathology in the same specimen container.  The sigmoid colon was normal.  The patient had significant amount of pain throughout the procedure, but the mucosa appeared normal.  There were no diverticula or spasms.  The splenic flexure, transverse colon, and hepatic flexure was unremarkable.   The cecal pouch was reached without difficulty and showed normal-appearing mucosa with appendiceal opening and normal ileocecal valve.  There was nothing to account for abdominal pain.  There  were no mucosal lesions.  The colonoscope was then retracted and the colon decompressed.  The patient tolerated the procedure well.  IMPRESSION: 1. Two left colon polyps, status post polypectomy. 2. Nothing to account for right lower quadrant abdominal pain.  PLAN: 1. Advance diet. 2. Small bowel followthrough of the colon to visualize the terminal ileum. Dictated by:   Hedwig Morton. Juanda Chance, M.D. [facility] Attending Physician:  Corwin Levins DD:  12/07/01 TD:  12/08/01 Job: [id number]

## 2010-11-02 NOTE — Assessment & Plan Note (Signed)
FOLLOW UP:  Ms. Acklin is a 55 year old white female with a long history of  abdominal pain treated with Lidoderm at the last visit.  She is back in  today and reports that the Lidoderm did not help very much.  She does have  some difficulty sleeping at night because of the abdominal pain and she does  limit some of her activity because of the abdominal pain.  She does stay  active, however.  She works around [employer], doing [employer],  cooking.  Would like to be out in the flower garden a little bit more but  her abdominal pain does get in the way.  She would like to do a little more  walking; however, again her abdominal pain does limit her walking.  Some  days, she is able to go approximately a mile.  She does have a history of  depression and migraines.  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   PHYSICAL EXAMINATION:  VITAL SIGNS:  Blood pressure is 111/56, pulse 87,  respirations 14.  Saturation 98% on room air.  GENERAL:  She has a somewhat flat affect.  She is cooperative, however.  She  does avoid making eye contact.  She is a well-developed, well-nourished  woman.  She does not appear to be in any distress during our interview.  HEART:  Regular rhythm.  LUNGS:  Clear.  ABDOMEN:  Bowel sounds are positive.  No tenderness in the right upper  quadrant and the left lower quadrant.  She does have tenderness with  palpation in the right lower quadrant.  She is able to do straight leg  raises; however, her abdomen is soft.  NEUROLOGICAL:  Her reflexes are intact in the upper extremities, 1 to 2+ in  the upper extremities at the biceps, triceps, brachial radialis.  There is  slightly diminished reflexes at the patellar tendon on the right compared to  the left; however, it is present and 1+ versus 2+ on the left.  Ankle  reflexes are 2+ bilaterally.  Toes are downgoing.  No clonus is noted.  She  reports diminished  sensation throughout most of the right half of her body  with pinprick.  Motor strength is 5/5 in her upper extremities and lower  extremities including shoulder abductors, biceps, triceps, wrist extensors,  finger flexors, and intrinsics.  There is 5/5 noted at the left hip, 4/5 at  the right hip, 5/5 at the knee extensors bilaterally, 5/5 at the  dorsiflexors, plantar flexors, and EHL bilaterally.   LABORATORY DATA:  I reviewed her chart.  She has had a thoracic and lumbar  MRI within the last two years.  She had one done December 31, 2001 of the lumbar  as well as the thoracic MRI.  The thoracic MRI was done January 03, 2002 and  both were essentially negative for pathology.   IMPRESSION:  Chronic abdominal pain, etiology not clear.  Has had  significant workup in the past. She continues to smoke four cigarettes a  day.  Lidoderm has not significantly helped her.  We will try a one-month  trial of Fentanyl 25 mcg per hour.  May discontinue it after one month if  there is not significant functional improvement. We will also consider  Elavil to help her with her insomnia.  We will see her back in one month.  She is given the following prescriptions.   DISCHARGE  MEDICATIONS:  1. Duragesic 1 patch q.72h. #10.  2. Amitriptyline 10 mg 1 p.o. q.h.s. #30.      Brantley Stage, M.D.   DMK/MedQ  D:  09/28/2003 17:36:37  T:  09/29/2003 01:09:19  Job #:  [id number]   cc:   Corwin Levins, M.D. [facility]

## 2010-11-02 NOTE — H&P (Signed)
[facility]  Patient:    [name], [name] Visit Number: [id number] MRN: [id number]          Service Type: MED Location: [id number] Attending Physician:  Dolores Patty Dictated by:   Titus Dubin. Alwyn Ren, M.D. [facility] Admit Date:  06/25/2001   CC:         [name], M.D. [facility], [place]  Rudean Curt, M.D., [place],  [place], [place] [phone])  Sandria Bales. Ezzard Standing, M.D.  Dr. Dorita Fray, Dept. of GI, [place]   History and Physical  HISTORY OF PRESENT ILLNESS:  [name] is a 55 year old white female admitted with possible diverticular abscess.  She describes pain in the right lower quadrant for several weeks associated with chills and fever.  When asked how she was treating this she stated "crying and not eating."  For unexplained reasons she stopped all her medications, many of which are psychotropics, one week ago.  She declined to elaborate on the reasons for this.  She has a long history of gastroparesis and colon dysfunction, for which she has been seen by Dr. Sheryn Bison here in [place] and also by Dr. Dorita Fray at [facility].  At this time she describes the pain as mild with no significant radiation. She has had vomiting x1.  She also has had anorexia in addition to nausea.  The history is difficult to obtain as most of the replies were monosyllabic or were nondescript phrases.  For example, when asked how much she smokes, the reply was "lots."  PAST MEDICAL HISTORY:  Long and complicated.  She has medical problems with: 1. Bipolar disorder. 2. Obsessive-compulsive disorder. 3. Migraines. 4. History of "brain aneurysm" for which she has had no treatment.  PAST SURGICAL HISTORY: 1. Shoulder surgery. 2. Bilateral elbow surgery. 3. Ovarian cyst removal. 4. Cholecystectomy (laparoscopic) in 1998. 5. Appendectomy.  OBSTETRICAL HISTORY:  She is gravida 2, para 2.  TOBACCO/ALCOHOL:  She smokes more than a pack  per day.  As noted, this is not quantitative.  ALLERGIES:  Initially she stated she was not allergic to any medicines, but there may be some question of intolerance to Darvocet.  FAMILY HISTORY:  Positive for coronary artery disease, cerebrovascular accident.  Her sister had leukemia.  Her mother may have had CNS malignancy and diabetes.  There is also a family history of hypertension.  REVIEW OF SYSTEMS:  As noted above.  As noted, she does not elaborate as questions were asked referring to systemic issues during the exam.  PHYSICAL EXAMINATION:  GENERAL:  She appears uncomfortable, lying in the right lateral decubitus position.  VITAL SIGNS:  Temperature 97.6, pulse 109, respiratory rate 20, blood pressure 125/71.  HEENT:  Tympanic membranes are scarred.  The fundi could not be visualized as she kept shifting her eyes as I attempted to examine the fundi.  Dental hygiene is fair, although there is tobacco staining.  SKIN:  Weathered.  LYMPH:  No lymphadenopathy about the head, neck, or axilla.  LUNGS:  Breath sounds are decreased, but there is no increased work of breathing.  HEART:  Heart sounds are distant, with an S4.  ABDOMEN:  Bowel sounds are decreased.  Abdomen was soft, but she describes tenderness to palpation, particularly in the right lower quadrant.  Striae are noted over the abdomen.  EXTREMITIES:  Pedal pulses are intact.  There is no edema.  NEUROLOGIC:  There is no definite neurologic deficit.  Psychiatrically, the patient does not appear to be  emotionally stable and seems very withdrawn.  LABORATORY DATA:  White count 9900.  Urine shows many bacteria with trace leukocyte esterase.  Urine pregnancy is negative.  Hematocrit normal. Chemistries were apparently not checked but will be ordered.  Ultrasound and CT suggested a 3-4 cm diverticular abscess.  Dr. Ezzard Standing was consulted and questions whether this might be an ovarian cyst.  ASSESSMENT AND PLAN:   She has bipolar disorder which has been untreated for at least a week.  The sudden discontinuation of her medicines raises the possibility of withdrawal syndrome.  She will be admitted with cefoxitin and gentamicin IV.  Psychiatric consult will be pursued.  Ductal drainage will be attempted.  Dr. Ezzard Standing will follow. Dictated by:   Titus Dubin. Alwyn Ren, M.D. [facility] Attending Physician:  Dolores Patty DD:  06/25/01 TD:  06/26/01 Job: [id number]

## 2010-11-02 NOTE — H&P (Signed)
NAMEJOHNNY, LATU                ACCOUNT NO.:  [id number]   MEDICAL RECORD NO.:  [id number]          PATIENT TYPE:  AMB   LOCATION:  SDS                          FACILITY:  [facility]   PHYSICIAN:  Sanjeev K. Deveshwar, M.D.DATE OF BIRTH:  1955/09/15   DATE OF ADMISSION:  04/03/2006  DATE OF DISCHARGE:                                HISTORY & PHYSICAL   CHIEF COMPLAINT:  History of cerebral aneurysm.   HISTORY OF PRESENT ILLNESS:  This is a pleasant 55 year old female who was  found to have multiple cerebral aneurysms by a MRA performed in 2001  secondary to a history of headaches. The patient was referred to Dr.  Corliss Skains on June 19, 2005. She underwent stenting of a large right middle  cerebral artery aneurysm. There was also a smaller 2-mm right middle  cerebral artery aneurysm that was also covered by the stent placement. 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 Her most recent cerebral  angiogram was performed January 01, 2006. At that time, the stents and aneurysm  appeared stable, although Dr. Corliss Skains felt that further stenting might be  required. The patient was readmitted to [facility] today, April 03, 2006, for cerebral angiogram and possible further intervention.   PAST MEDICAL HISTORY:  1. Significant for migraine headaches.  2. Bipolar disorder.  3. Obsessive-compulsive disorder.  4. Gastroesophageal reflux disease.  5. Gastroparesis.  6. Chronic abdominal pain.  7. History of esophageal strictures.  8. Ongoing tobacco use.  9. History of seizures.  10.History of auditory hallucinations.  11.Above-noted history of cerebral aneurysms.   PAST SURGICAL HISTORY:  1. Significant for a total abdominal hysterectomy.  2. Previous shoulder surgery.  3. Elbow surgery.  4.  Cholecystectomy.  5. Appendectomy.  6. Previous surgery for a ruptured ovarian cyst.   The patient reports that she sometimes has urinary retention following  anesthesia.   ALLERGIES:  THE PATIENT IS ALLERGIC TO DARVOCET AND OXYCONTIN, BOTH OF WHICH  CAUSE A RASH AND ITCHING. SHE IS INTOLERANT TO LACTOSE.   SOCIAL HISTORY:  The patient is widowed. She has two children. She lives in  [place]. Her son lives with her. She continues to smoke. She has been  trying to quit. She has cut down significantly. She denies alcohol use.   FAMILY HISTORY:  Her mother died in her 53s of cancer. She also had diabetes  and coronary artery disease. Her father died in his 5s or 63s from cancer.   CURRENT MEDICATIONS:  Aciphex, Nasonex, Lamictal, Lasix which she takes on a  p.r.n. basis, Xalatan, Abilify, Crestor, Topamax, aspirin, clonazepam,  Astelin, Ambien, Singulair, Phenergan and Klor-Con.   LABORATORY DATA:  An INR of 0.9, PTT 38. A CBC reveals hemoglobin 14.1,  hematocrit 40.9, WBC 10.9 thousand, platelets 257,000, BUN 7, creatinine  1.1, potassium 4.5, glucose 101.   REVIEW OF SYSTEMS:  Completely negative except  for frequent severe headaches  and occasional nausea which she treats with Phenergan.   PHYSICAL EXAMINATION:  Reveals a pleasant 55 year old white female in no  acute distress.  VITAL SIGNS:  Blood pressure 104/66, pulse 96, respirations 20, temperature  98.1, oxygen saturation 97%.  HEENT:  Unremarkable.  NECK:  Reveals no bruits. No jugular venous distention.  HEART:  Reveals regular rate and rhythm without murmur.  LUNGS:  Clear but slightly decreased.  ABDOMEN:  Soft, nontender.  EXTREMITIES:  Revealed pulses to be intact with trace edema. Her airway is  rated at a 2. Her ASA scale is rated at a 3.  NEUROLOGICAL:  Mental status:  The patient is alert and oriented and follows  commands. Cranial nerves II-XII are grossly intact. Sensation is intact to  light touch.  Motor strength is 5/5 throughout. Cerebellar testing is intact.   IMPRESSION:  1. Status post stenting of two right middle cerebral artery aneurysms      performed June 19, 2005.  2. Status post stenting of a basilar artery aneurysm performed October 03, 2005.  3. History of migraine headaches.  4. History of bipolar disorder as well as obsessive-compulsive disorder.  5. Gastroesophageal reflux disease, gastroparesis, chronic abdominal pain,      and esophageal strictures.  6. Ongoing tobacco use, question COPD.  7. History of seizures.  8. History of auditory hallucinations.  9. Status post multiple surgeries.   ALLERGIES:  DARVOCET AND OXYCONTIN.   IMPRESSION AND PLAN:  As noted, the patient will undergo a repeat cerebral  angiogram today with possible further intervention if felt to be safe and  indicated.  __________ at the level of the trifurcation with stable position  of the Neuroform stent. 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      [name], P.A.    ______________________________  Grandville Silos. Corliss Skains, M.D.    DR/MEDQ  D:  04/03/2006  T:  04/03/2006  Job:  [id number]   cc:   Corwin Levins, MD  Danae Orleans. Venetia Maxon, M.D.  Melvenia Beam, M.D.

## 2010-12-13 ENCOUNTER — Other Ambulatory Visit: Payer: Self-pay | Admitting: Internal Medicine

## 2010-12-18 ENCOUNTER — Encounter: Payer: Self-pay | Admitting: Gastroenterology

## 2010-12-31 ENCOUNTER — Encounter: Payer: Self-pay | Admitting: Internal Medicine

## 2011-01-11 ENCOUNTER — Other Ambulatory Visit: Payer: Self-pay | Admitting: Internal Medicine

## 2011-01-14 ENCOUNTER — Other Ambulatory Visit: Payer: Self-pay | Admitting: Internal Medicine

## 2011-01-17 ENCOUNTER — Ambulatory Visit: Payer: Medicare Other

## 2011-02-15 ENCOUNTER — Encounter: Payer: Self-pay | Admitting: Internal Medicine

## 2011-02-15 ENCOUNTER — Ambulatory Visit (INDEPENDENT_AMBULATORY_CARE_PROVIDER_SITE_OTHER)
Admission: RE | Admit: 2011-02-15 | Discharge: 2011-02-15 | Disposition: A | Discharge status: Home / Self Care | Payer: Medicare Other | Source: Ambulatory Visit | Attending: Internal Medicine | Admitting: Internal Medicine

## 2011-02-15 ENCOUNTER — Ambulatory Visit (INDEPENDENT_AMBULATORY_CARE_PROVIDER_SITE_OTHER): Payer: Medicare Other | Admitting: Internal Medicine

## 2011-02-15 ENCOUNTER — Other Ambulatory Visit: Payer: Self-pay | Admitting: Internal Medicine

## 2011-02-15 ENCOUNTER — Other Ambulatory Visit (INDEPENDENT_AMBULATORY_CARE_PROVIDER_SITE_OTHER): Payer: Medicare Other

## 2011-02-15 DIAGNOSIS — IMO0001 Reserved for inherently not codable concepts without codable children: Secondary | ICD-10-CM

## 2011-02-15 DIAGNOSIS — E559 Vitamin D deficiency, unspecified: Secondary | ICD-10-CM

## 2011-02-15 DIAGNOSIS — E538 Deficiency of other specified B group vitamins: Secondary | ICD-10-CM

## 2011-02-15 DIAGNOSIS — J984 Other disorders of lung: Secondary | ICD-10-CM

## 2011-02-15 DIAGNOSIS — E785 Hyperlipidemia, unspecified: Secondary | ICD-10-CM

## 2011-02-15 DIAGNOSIS — R51 Headache: Secondary | ICD-10-CM

## 2011-02-15 DIAGNOSIS — I671 Cerebral aneurysm, nonruptured: Secondary | ICD-10-CM | POA: Insufficient documentation

## 2011-02-15 DIAGNOSIS — R519 Headache, unspecified: Secondary | ICD-10-CM | POA: Insufficient documentation

## 2011-02-15 DIAGNOSIS — J449 Chronic obstructive pulmonary disease, unspecified: Secondary | ICD-10-CM

## 2011-02-15 LAB — COMPREHENSIVE METABOLIC PANEL
AST: 16 U/L (ref 0–37)
Alkaline Phosphatase: 79 U/L (ref 39–117)
BUN: 16 mg/dL (ref 6–23)
Creatinine, Ser: 0.8 mg/dL (ref 0.4–1.2)
Glucose, Bld: 111 mg/dL — ABNORMAL HIGH (ref 70–99)
Total Bilirubin: 0.3 mg/dL (ref 0.3–1.2)

## 2011-02-15 LAB — CBC WITH DIFFERENTIAL/PLATELET
Basophils Relative: 0.3 % (ref 0.0–3.0)
Eosinophils Absolute: 0.2 10*3/uL (ref 0.0–0.7)
Lymphocytes Relative: 18.8 % (ref 12.0–46.0)
MCHC: 33 g/dL (ref 30.0–36.0)
MCV: 97.2 fl (ref 78.0–100.0)
Monocytes Absolute: 0.7 10*3/uL (ref 0.1–1.0)
Neutrophils Relative %: 72.2 % (ref 43.0–77.0)
Platelets: 256 10*3/uL (ref 150.0–400.0)
RBC: 4.33 Mil/uL (ref 3.87–5.11)
WBC: 9.4 10*3/uL (ref 4.5–10.5)

## 2011-02-15 LAB — LIPID PANEL
HDL: 53.7 mg/dL (ref >39.00)
Triglycerides: 104 mg/dL (ref 0.0–149.0)
VLDL: 20.8 mg/dL (ref 0.0–40.0)

## 2011-02-15 LAB — URINALYSIS, ROUTINE W REFLEX MICROSCOPIC
Bilirubin Urine: NEGATIVE
Ketones, ur: NEGATIVE
Leukocytes, UA: NEGATIVE
Urobilinogen, UA: 0.2 (ref 0.0–1.0)

## 2011-02-15 LAB — SEDIMENTATION RATE: Sed Rate: 17 mm/hr (ref 0–22)

## 2011-02-15 LAB — LDL CHOLESTEROL, DIRECT: Direct LDL: 170.2 mg/dL

## 2011-02-15 LAB — VITAMIN B12: Vitamin B-12: 267 pg/mL (ref 211–911)

## 2011-02-15 LAB — CK: Total CK: 79 U/L (ref 7–177)

## 2011-02-15 MED ORDER — PROMETHAZINE HCL 12.5 MG PO TABS: 12.5000 mg | ORAL_TABLET | Freq: Four times a day (QID) | ORAL | Status: DC | PRN

## 2011-02-15 MED ORDER — TIOTROPIUM BROMIDE MONOHYDRATE 18 MCG IN CAPS: 18.0000 ug | ORAL_CAPSULE | Freq: Every day | RESPIRATORY_TRACT | Status: DC

## 2011-02-15 NOTE — Progress Notes (Signed)
Subjective:    Patient ID: [name], female    DOB: 08/15/1955, 55 y.o.   MRN: [id number]  Headache  This is a recurrent problem. The current episode started more than 1 month ago. The problem occurs constantly. The problem has been unchanged. The pain is located in the bilateral and occipital region. The pain does not radiate. The pain quality is similar to prior headaches. The quality of the pain is described as sharp and stabbing. The pain is at a severity of 5/10. The pain is moderate. Associated symptoms include coughing, insomnia, muscle aches and nausea. Pertinent negatives include no abdominal pain, abnormal behavior, anorexia, back pain, blurred vision, dizziness, drainage, ear pain, eye pain, eye redness, eye watering, facial sweating, fever, hearing loss, loss of balance, neck pain, numbness, phonophobia, photophobia, rhinorrhea, scalp tenderness, seizures, sinus pressure, sore throat, swollen glands, tingling, tinnitus, visual change, vomiting, weakness or weight loss. The symptoms are aggravated by nothing. She has tried NSAIDs and oral narcotics for the symptoms. The treatment provided mild relief. Her past medical history is significant for migraine headaches. There is no history of obesity.  Hyperlipidemia This is a chronic problem. The current episode started more than 1 year ago. The problem is controlled. Recent lipid tests were reviewed and are variable. She has no history of chronic renal disease, diabetes, hypothyroidism, liver disease, obesity or nephrotic syndrome. Factors aggravating her hyperlipidemia include fatty foods. Associated symptoms include myalgias. Pertinent negatives include no chest pain, focal sensory loss, focal weakness, leg pain or shortness of breath. Current antihyperlipidemic treatment includes statins. The current treatment provides moderate improvement of lipids. Compliance problems include adherence to diet and adherence to exercise.   Cough This is a  chronic problem. The current episode started more than 1 year ago. The problem has been unchanged. The problem occurs every few hours. The cough is non-productive. Associated symptoms include headaches and myalgias. Pertinent negatives include no chest pain, chills, ear congestion, ear pain, eye redness, fever, heartburn, hemoptysis, nasal congestion, postnasal drip, rash, rhinorrhea, sore throat, shortness of breath, sweats, weight loss or wheezing. The symptoms are aggravated by fumes. Risk factors for lung disease include smoking/tobacco exposure. She has tried nothing for the symptoms. Her past medical history is significant for COPD.      Review of Systems  Constitutional: Positive for fatigue and unexpected weight change (weight gain). Negative for fever, chills, weight loss, activity change and appetite change.  HENT: Negative for hearing loss, ear pain, sore throat, facial swelling, rhinorrhea, drooling, mouth sores, trouble swallowing, neck pain, dental problem, voice change, postnasal drip, sinus pressure and tinnitus.   Eyes: Negative for blurred vision, photophobia, pain, discharge, redness, itching and visual disturbance.  Respiratory: Positive for cough. Negative for apnea, hemoptysis, choking, chest tightness, shortness of breath, wheezing and stridor.   Cardiovascular: Negative for chest pain.  Gastrointestinal: Positive for nausea. Negative for heartburn, vomiting, abdominal pain, diarrhea, constipation, blood in stool, abdominal distention, anal bleeding, rectal pain and anorexia.  Genitourinary: Negative for dysuria, urgency, frequency, hematuria, flank pain, decreased urine volume, enuresis, difficulty urinating and dyspareunia.  Musculoskeletal: Positive for myalgias and arthralgias. Negative for back pain, joint swelling and gait problem.  Skin: Negative for color change, pallor, rash and wound.  Neurological: Positive for headaches. Negative for dizziness, tingling, tremors,  focal weakness, seizures, syncope, facial asymmetry, speech difficulty, weakness, light-headedness, numbness and loss of balance.  Hematological: Negative for adenopathy. Does not bruise/bleed easily.  Psychiatric/Behavioral: Positive for sleep disturbance (DFA, FA, EMA).  Negative for suicidal ideas, hallucinations, behavioral problems, confusion, self-injury, dysphoric mood, decreased concentration and agitation. The patient has insomnia. The patient is not nervous/anxious and is not hyperactive.        Objective:   Physical Exam  Vitals reviewed. Constitutional: She is oriented to person, place, and time. She appears well-developed and well-nourished. No distress.  HENT:  Head: Normocephalic and atraumatic.  Right Ear: External ear normal.  Left Ear: External ear normal.  Nose: Nose normal.  Mouth/Throat: No oropharyngeal exudate.  Eyes: Conjunctivae and EOM are normal. Pupils are equal, round, and reactive to light. Right eye exhibits no discharge. Left eye exhibits no discharge. No scleral icterus.  Neck: Normal range of motion. Neck supple. No JVD present. No tracheal deviation present. No thyromegaly present.  Cardiovascular: Normal rate, regular rhythm, normal heart sounds and intact distal pulses.  Exam reveals no gallop and no friction rub.   No murmur heard. Pulmonary/Chest: Effort normal and breath sounds normal. No stridor. No respiratory distress. She has no wheezes. She has no rales. She exhibits no tenderness.  Abdominal: Soft. Bowel sounds are normal. She exhibits no distension and no mass. There is no tenderness. There is no rebound and no guarding.  Musculoskeletal: Normal range of motion. She exhibits no edema and no tenderness.  Lymphadenopathy:    She has no cervical adenopathy.  Neurological: She is alert and oriented to person, place, and time. She displays no atrophy, no tremor and normal reflexes. No cranial nerve deficit or sensory deficit. She exhibits normal  muscle tone. She displays a negative Romberg sign. She displays no seizure activity. Coordination and gait normal. She displays no Babinski's sign on the right side. She displays no Babinski's sign on the left side.  Reflex Scores:      Tricep reflexes are 1+ on the right side and 1+ on the left side.      Bicep reflexes are 1+ on the right side and 1+ on the left side.      Brachioradialis reflexes are 1+ on the right side and 1+ on the left side.      Patellar reflexes are 1+ on the right side and 1+ on the left side.      Achilles reflexes are 1+ on the right side and 1+ on the left side. Skin: She is not diaphoretic.          Assessment & Plan:

## 2011-02-15 NOTE — Assessment & Plan Note (Signed)
I will recheck her CXR today 

## 2011-02-15 NOTE — Assessment & Plan Note (Addendum)
I think she needs to start Spiriva so have given her samples today, also I have asked her to quit smoking

## 2011-02-15 NOTE — Patient Instructions (Addendum)
Headache, General, Without Cause The specific cause of your headache may not have been found today. There are many causes and types of headache. A few common ones are:  Tension headache.  Migraine.   Infections (examples: dental and sinus infections).  Bone and/or joint problems in the neck or jaw.   Depression.  Eye problems.   These headaches are not life threatening.  Headaches can sometimes be diagnosed by a patient history and a physical exam. Sometimes, lab and imaging studies (such as x-ray and/or CT scan) are used to rule out more serious problems. In some cases, a spinal tap (lumbar puncture) may be requested. There are many times when your exam and tests may be normal on the first visit even when there is a serious problem causing your headaches. Because of that, it is very important to follow up with your doctor or local clinic for further evaluation. HOME CARE INSTRUCTIONS  Keep follow-up appointments with your caregiver, or any specialist referral.   Only take over-the-counter or prescription medicines for pain, discomfort, or fever as directed by your caregiver.   Biofeedback, massage, or other relaxation techniques may be helpful.   Ice packs or heat applied to the head and neck can be used. Do this three to four times per day, or as needed.   Call your doctor if you have any questions or concerns.   If you smoke, you should quit.  FINDING OUT THE RESULTS OF TESTS  If a radiology test was performed, a radiologist will review your results.   You will be contacted by the emergency department or your physician if any test results require a change in your treatment plan.   Not all test results may be available during your visit. If your test results are not back during the visit, make an appointment with your caregiver to find out the results. Do not assume everything is normal if you have not heard from your caregiver or the medical facility. It is important for you to  follow up on all of your test results.  SEEK MEDICAL CARE IF:  You develop problems with medications prescribed.   You do not respond to or obtain relief from medications.   You have a change from the usual headache.   You develop nausea or vomiting.  SEEK IMMEDIATE MEDICAL CARE IF:   If your headache becomes severe.   You have an unexplained oral temperature above 100.5, or as your caregiver suggests.   You have a stiff neck.   You have loss of vision.   You have muscular weakness.   You have loss of muscular control.   You develop severe symptoms different from your first symptoms.   You start losing your balance or have trouble walking.   You feel faint or pass out.  MAKE SURE YOU:   Understand these instructions.   Will watch your condition.   Will get help right away if you are not doing well or get worse.  Document Released: 06/03/2005 Document Re-Released: 05/16/2008 [facility] Patient Information 2011 [place], [place].Chronic Obstructive Pulmonary Disease (COPD) Chronic obstructive pulmonary disease (COPD) is a condition in which airflow from the lungs is restricted. The lungs can never return to normal, but there are measures you can take which will improve them and make you feel better. CAUSES  Smoking.   Breathing in irritants (pollution, cigarette smoke, strong odors, aerosol sprays, paint fumes).   History of lung infections.  TREATMENT  Treatment focuses on making you comfortable (  supportive care).  HOME CARE INSTRUCTIONS  If you smoke, stop smoking. The carbon monoxide buildup in the blood robs you of your already short oxygen supply.   Take medicines (antibiotics) that kill germs as directed.   Avoid antihistamines and cough syrups. They dry up your system and slow down the elimination of secretions. This decreases respiratory capacity and may lead to infections.   Drink enough water and fluids to keep your urine clear or pale yellow. This loosens  secretions.   Use humidifiers at home and at your bedside if they do not make breathing difficult.   Receive all protective vaccines your caregiver suggests, especially pneumococcal and influenza.   Use home oxygen as suggested.  SEEK MEDICAL CARE IF:  You develop pus-like mucus (sputum).   You have an oral temperature above 100.5.   Breathing is more labored or exercise becomes difficult to do.   You are running out of the medicine you take for your breathing.  SEEK IMMEDIATE MEDICAL CARE IF:  You have a rapid heart rate.   You have agitation, confusion, tremors, or are in a stupor (family members may need to observe this).   It becomes difficult to breathe.   You develop chest pain.   You have an oral temperature above 100.5, not controlled by medicine.  MAKE SURE YOU:   Understand these instructions.   Will watch your condition.   Will get help right away if you are not doing well or get worse.  Document Released: 03/13/2005 Document Re-Released: 08/28/2009 [facility] Patient Information 2011 [place], [place].

## 2011-02-15 NOTE — Assessment & Plan Note (Signed)
With the headache I think she needs a CT of the brain so I have asked her to do that today

## 2011-02-15 NOTE — Assessment & Plan Note (Signed)
I will check her vitamin D level today 

## 2011-02-15 NOTE — Assessment & Plan Note (Signed)
She has a protracted HA and a hx of getting coils in 2007 for aneurysms so I will check a CT scan today to look for complications, bleeding, etc.

## 2011-02-15 NOTE — Assessment & Plan Note (Signed)
She needs to have a B12 level checked

## 2011-02-15 NOTE — Assessment & Plan Note (Signed)
She has myalgias so I will check a CPK level today and will also check her FLP and CMP

## 2011-02-16 LAB — C-REACTIVE PROTEIN: CRP: 0.29 mg/dL (ref <0.60)

## 2011-02-19 LAB — VITAMIN D 1,25 DIHYDROXY
Vitamin D 1, 25 (OH)2 Total: 31 pg/mL (ref 18–72)
Vitamin D2 1, 25 (OH)2: <8 pg/mL
Vitamin D3 1, 25 (OH)2: 31 pg/mL

## 2011-02-22 ENCOUNTER — Other Ambulatory Visit: Payer: Medicare Other

## 2011-03-01 ENCOUNTER — Encounter: Payer: Self-pay | Admitting: Internal Medicine

## 2011-03-01 ENCOUNTER — Telehealth: Payer: Self-pay

## 2011-03-01 ENCOUNTER — Ambulatory Visit (INDEPENDENT_AMBULATORY_CARE_PROVIDER_SITE_OTHER): Payer: Medicare Other | Admitting: Internal Medicine

## 2011-03-01 DIAGNOSIS — Z0001 Encounter for general adult medical examination with abnormal findings: Secondary | ICD-10-CM | POA: Insufficient documentation

## 2011-03-01 DIAGNOSIS — G4700 Insomnia, unspecified: Secondary | ICD-10-CM

## 2011-03-01 DIAGNOSIS — F3131 Bipolar disorder, current episode depressed, mild: Secondary | ICD-10-CM

## 2011-03-01 DIAGNOSIS — R079 Chest pain, unspecified: Secondary | ICD-10-CM

## 2011-03-01 DIAGNOSIS — IMO0001 Reserved for inherently not codable concepts without codable children: Secondary | ICD-10-CM

## 2011-03-01 DIAGNOSIS — J4489 Other specified chronic obstructive pulmonary disease: Secondary | ICD-10-CM

## 2011-03-01 DIAGNOSIS — Z0000 Encounter for general adult medical examination without abnormal findings: Secondary | ICD-10-CM

## 2011-03-01 DIAGNOSIS — J449 Chronic obstructive pulmonary disease, unspecified: Secondary | ICD-10-CM

## 2011-03-01 MED ORDER — DULOXETINE HCL 60 MG PO CPEP: 60.0000 mg | ORAL_CAPSULE | Freq: Every day | ORAL | Status: DC

## 2011-03-01 MED ORDER — ZOLPIDEM TARTRATE 10 MG PO TABS: 10.0000 mg | ORAL_TABLET | Freq: Every evening | ORAL | Status: DC | PRN

## 2011-03-01 MED ORDER — DULOXETINE HCL 30 MG PO CPEP: 30.0000 mg | ORAL_CAPSULE | Freq: Every day | ORAL | Status: DC

## 2011-03-01 NOTE — Patient Instructions (Addendum)
Start the cymbalta at 30 mg per day for one week, then take 2 per day (total of 60 mg) starting the second week You can use the coupon for the free #30 of the 30 mg capsules to start this, then use the prescription for the 60 mg after that At the same time, you should take the zoloft (sertraline) at the usual 100 mg per day for 1 week, then 50 mg per day for 3 weeks, then stop (let us know if you need a prescipription for the zoloft to do this) OK to continue all other medications for now You will be contacted regarding the referral for: psychiatry OK to cancel next Wed appt Please return in 6 mo with Lab testing done 3-5 days before

## 2011-03-01 NOTE — Progress Notes (Signed)
Subjective:    Patient ID: [name], female    DOB: Aug 29, 1955, 55 y.o.   MRN: [id number]  HPI  Here with c/o uncontrolled anxiety, despite mult meds, Denies worsening depressive symptoms, suicidal ideation, though has had several panic episode recently, assoc with diffuse sharp chest pains intermittent for several wks, with some bilat upper back and left shoudler discomfort, with some sob but no n/v, diaphoresis, palps, dizziness, or worse with exertion or pleuritic, better with less panic.  Has not been seen per psychiatry in some time and eager for referral as well.  Pt denies other chest pain, increased sob or doe, wheezing, orthopnea, PND, increased LE swelling.  Pt denies new neurological symptoms such as new headache, or facial or extremity weakness or numbness   Pt denies polydipsia, polyuria, Pt states overall good compliance with meds.   Past Medical History  Diagnosis Date  . Depression     bipolar  . OCD (obsessive compulsive disorder)     anxiety  . Migraines   . Brain aneurysm   . Chronic abdominal pain   . History of colonic polyps   . Peripheral neuropathy   . Fibromyalgia   . HLD (hyperlipidemia)   . GERD (gastroesophageal reflux disease)   . IBS (irritable bowel syndrome)     chronic constipation  . Allergic rhinitis   . Gastroparesis   . COPD (chronic obstructive pulmonary disease)    Past Surgical History  Procedure Date  . Appendectomy   . Cholecystectomy   . Vesicovaginal fistula closure w/ tah   . Oophorectomy   . Coil to brain aneurysm   . Back surgery     disc  . Shoulder surgery   . Elbow surgery     reports that she has been smoking.  She does not have any smokeless tobacco history on file. She reports that she does not drink alcohol or use illicit drugs. family history includes Cancer in her father, mother, sister, and unspecified family member; Coronary artery disease in an unspecified family member; Diabetes in an unspecified family member;  Hypertension in an unspecified family member; Leukemia in her sister; and Seizures in an unspecified family member.  There is no history of Colon cancer. Allergies  Allergen Reactions  . Nsaids     REACTION: GI irritation  . Oxycodone Hcl   . Propoxyphene N-Acetaminophen   . Tramadol     REACTION: nausea  . Varenicline Tartrate     REACTION: agitation, depression   Current Outpatient Prescriptions on File Prior to Visit  Medication Sig Dispense Refill  . ARIPiprazole (ABILIFY) 30 MG tablet Take 30 mg by mouth daily.        Marland Kitchen aspirin 81 MG EC tablet Take 81 mg by mouth daily.        Marland Kitchen buPROPion (WELLBUTRIN XL) 150 MG 24 hr tablet Take 150 mg by mouth daily.        . Cholecalciferol (VITAMIN D3) 1000 UNITS CAPS Take 1 capsule by mouth daily.        . CRESTOR 20 MG tablet take 1 tablet by mouth once daily  30 tablet  5  . cyanocobalamin (,VITAMIN B-12,) 1000 MCG/ML injection Inject into the muscle every 30 (thirty) days. Inject 1 mL       . KLOR-CON 10 10 MEQ CR tablet take 1 tablet by mouth once daily  30 tablet  8  . lamoTRIgine (LAMICTAL) 200 MG tablet Take 200 mg by mouth at bedtime.        Marland Kitchen  pantoprazole (PROTONIX) 40 MG tablet take 1 tablet by mouth once daily  30 tablet  10  . sertraline (ZOLOFT) 100 MG tablet Take 100 mg by mouth daily.        Marland Kitchen tiotropium (SPIRIVA HANDIHALER) 18 MCG inhalation capsule Place 1 capsule (18 mcg total) into inhaler and inhale daily.  30 capsule  11  . oxyCODONE (OXY IR/ROXICODONE) 5 MG immediate release tablet Take 5-10 mg by mouth every 6 (six) hours as needed.        . traMADol (ULTRAM) 50 MG tablet Take 50-100 mg by mouth every 6 (six) hours as needed.         Review of Systems Review of Systems  Constitutional: Negative for diaphoresis and unexpected weight change.  HENT: Negative for drooling and tinnitus.   Eyes: Negative for photophobia and visual disturbance.  Respiratory: Negative for choking and stridor.   Gastrointestinal: Negative for  vomiting and blood in stool.  Genitourinary: Negative for hematuria and decreased urine volume.     Objective:   Physical Exam BP 100/62  Pulse 98  Temp(Src) 98.5 F (36.9 C) (Oral)  Ht 5\' 5"  (1.651 m)  Wt 168 lb 6 oz (76.374 kg)  BMI 28.02 kg/m2  SpO2 98% Physical Exam  VS noted Constitutional: Pt appears well-developed and well-nourished.  HENT: Head: Normocephalic.  Right Ear: External ear normal.  Left Ear: External ear normal.  Eyes: Conjunctivae and EOM are normal. Pupils are equal, round, and reactive to light.  Neck: Normal range of motion. Neck supple.  Cardiovascular: Normal rate and regular rhythm.   Pulmonary/Chest: Effort normal and breath sounds normal.  Abd:  Soft, NT, non-distended, + BS Neurological: Pt is alert. No cranial nerve deficit.  Skin: Skin is warm. No erythema.  Psychiatric: Pt behavior is normal. Thought content normal. 2+ nervous       Assessment & Plan:

## 2011-03-01 NOTE — Telephone Encounter (Signed)
Pt called stating she has been experiencing CP on and off x 8 weeks. Per pt, she has gone to ER and was told that LT arm pain associated with CP was due to a pulled muscle and cardiac work up was negative. Pt believes CP to be anxiety and stress related, stating: "I have had this before when a lot of things were going on". Pt had appt scheduled with [facility] for 09/19 but appt was moved to acute opening today.

## 2011-03-02 ENCOUNTER — Encounter: Payer: Self-pay | Admitting: Internal Medicine

## 2011-03-02 DIAGNOSIS — R079 Chest pain, unspecified: Secondary | ICD-10-CM | POA: Insufficient documentation

## 2011-03-02 DIAGNOSIS — G4700 Insomnia, unspecified: Secondary | ICD-10-CM | POA: Insufficient documentation

## 2011-03-02 NOTE — Assessment & Plan Note (Signed)
?   Msk, ecg reveiwed as per emr, for tylenol prn,  to f/u any worsening symptoms or concerns

## 2011-03-02 NOTE — Assessment & Plan Note (Signed)
Exam most c/w this, for cymbalta as above,  to f/u any worsening symptoms or concerns

## 2011-03-02 NOTE — Assessment & Plan Note (Signed)
stable overall by hx and exam, most recent data reviewed with pt, and pt to continue medical treatment as before  SpO2 Readings from Last 3 Encounters:  03/01/11 98%  02/15/11 90%  08/31/10 98%

## 2011-03-02 NOTE — Assessment & Plan Note (Signed)
Ok for prn Remus Loffler , to f/u any worsening symptoms or concerns

## 2011-03-02 NOTE — Assessment & Plan Note (Signed)
Uncontrolled anxiety, for now in light of her c/o pain as well will try wean off zoloft, start cymbalta, cont lamictal and all meds,  For psychiatry referral

## 2011-03-06 ENCOUNTER — Ambulatory Visit: Payer: Medicare Other | Admitting: Internal Medicine

## 2011-03-12 ENCOUNTER — Other Ambulatory Visit: Payer: Self-pay | Admitting: Internal Medicine

## 2011-03-14 LAB — BASIC METABOLIC PANEL
Chloride: 109
Creatinine, Ser: 1.15
GFR calc Af Amer: >60
Potassium: 4.5
Sodium: 139

## 2011-03-14 LAB — PROTIME-INR: Prothrombin Time: 11.3 — ABNORMAL LOW

## 2011-03-14 LAB — CBC
HCT: 41.6
Hemoglobin: 14.3
MCV: 96.4
RBC: 4.32
WBC: 11.2 — ABNORMAL HIGH

## 2011-04-01 LAB — BASIC METABOLIC PANEL
GFR calc non Af Amer: 50 — ABNORMAL LOW
Glucose, Bld: 90
Potassium: 4.3
Sodium: 141

## 2011-04-01 LAB — CBC
HCT: 43.3
Hemoglobin: 14.7
MCV: 95.2
RDW: 13.3
WBC: 9.8

## 2011-04-01 LAB — DIFFERENTIAL
Eosinophils Absolute: 0.2
Eosinophils Relative: 2
Lymphocytes Relative: 23
Lymphs Abs: 2.2
Monocytes Absolute: 0.5

## 2011-04-02 ENCOUNTER — Other Ambulatory Visit (HOSPITAL_COMMUNITY): Payer: Self-pay | Admitting: Interventional Radiology

## 2011-04-02 DIAGNOSIS — Z09 Encounter for follow-up examination after completed treatment for conditions other than malignant neoplasm: Secondary | ICD-10-CM

## 2011-04-02 DIAGNOSIS — I729 Aneurysm of unspecified site: Secondary | ICD-10-CM

## 2011-04-17 ENCOUNTER — Ambulatory Visit (HOSPITAL_COMMUNITY)
Admission: RE | Admit: 2011-04-17 | Discharge: 2011-04-17 | Disposition: A | Discharge status: Home / Self Care | Payer: Medicare Other | Source: Ambulatory Visit | Attending: Interventional Radiology | Admitting: Interventional Radiology

## 2011-04-17 ENCOUNTER — Other Ambulatory Visit (HOSPITAL_COMMUNITY): Payer: Self-pay | Admitting: Interventional Radiology

## 2011-04-17 DIAGNOSIS — I671 Cerebral aneurysm, nonruptured: Secondary | ICD-10-CM | POA: Insufficient documentation

## 2011-04-17 DIAGNOSIS — Z09 Encounter for follow-up examination after completed treatment for conditions other than malignant neoplasm: Secondary | ICD-10-CM

## 2011-04-17 DIAGNOSIS — I729 Aneurysm of unspecified site: Secondary | ICD-10-CM

## 2011-04-17 DIAGNOSIS — R51 Headache: Secondary | ICD-10-CM | POA: Insufficient documentation

## 2011-04-17 LAB — BASIC METABOLIC PANEL
CO2: 23 mEq/L (ref 19–32)
Chloride: 105 mEq/L (ref 96–112)
Glucose, Bld: 103 mg/dL — ABNORMAL HIGH (ref 70–99)
Potassium: 3.7 mEq/L (ref 3.5–5.1)
Sodium: 141 mEq/L (ref 135–145)

## 2011-04-17 LAB — CBC
HCT: 41.7 % (ref 36.0–46.0)
MCH: 33 pg (ref 26.0–34.0)
MCV: 94.3 fL (ref 78.0–100.0)
RBC: 4.42 MIL/uL (ref 3.87–5.11)
WBC: 11.5 10*3/uL — ABNORMAL HIGH (ref 4.0–10.5)

## 2011-04-17 LAB — APTT: aPTT: 30 seconds (ref 24–37)

## 2011-04-17 MED ORDER — IOHEXOL 300 MG/ML  SOLN
150.0000 mL | Freq: Once | INTRAMUSCULAR | Status: AC | PRN
  Administered 2011-04-17: 65 mL via INTRA_ARTERIAL

## 2011-04-18 ENCOUNTER — Telehealth: Payer: Self-pay

## 2011-04-18 DIAGNOSIS — J449 Chronic obstructive pulmonary disease, unspecified: Secondary | ICD-10-CM

## 2011-04-18 NOTE — Telephone Encounter (Signed)
Pt called requesting advisement on B12 injections. Her last injection was in April of this year but recent blood levels were within the normal range, should she resume monthly injections? Please advise.

## 2011-04-18 NOTE — Telephone Encounter (Signed)
Pt advised of MD's recommendations via VM

## 2011-04-18 NOTE — Telephone Encounter (Signed)
Lab Results  Component Value Date   VITAMINB12 267 02/15/2011   Since recent level normal, no indication to resume B12 shots at this time. Recommend over-the-counter B12 1000 mcg daily. Followup with primary care physician as needed

## 2011-05-02 ENCOUNTER — Ambulatory Visit (INDEPENDENT_AMBULATORY_CARE_PROVIDER_SITE_OTHER): Payer: Medicare Other | Admitting: Internal Medicine

## 2011-05-02 VITALS — BP 114/78 | HR 87 | Temp 98.8°F | Wt 162.0 lb

## 2011-05-02 DIAGNOSIS — J019 Acute sinusitis, unspecified: Secondary | ICD-10-CM

## 2011-05-02 DIAGNOSIS — J449 Chronic obstructive pulmonary disease, unspecified: Secondary | ICD-10-CM

## 2011-05-02 DIAGNOSIS — F411 Generalized anxiety disorder: Secondary | ICD-10-CM

## 2011-05-02 MED ORDER — LEVOFLOXACIN 500 MG PO TABS: 500.0000 mg | ORAL_TABLET | Freq: Every day | ORAL | Status: AC

## 2011-05-02 NOTE — Patient Instructions (Addendum)
You had the flu shot today Take all new medications as prescribed Continue all other medications as before You can also take Delsym OTC for cough, and/or Mucinex (or it's generic off brand) for congestion  

## 2011-05-05 ENCOUNTER — Encounter: Payer: Self-pay | Admitting: Internal Medicine

## 2011-05-05 MED ORDER — LURASIDONE HCL 80 MG PO TABS: 80.0000 mg | ORAL_TABLET | Freq: Every day | ORAL | Status: DC

## 2011-05-05 MED ORDER — PRAZOSIN HCL 1 MG PO CAPS: 1.0000 mg | ORAL_CAPSULE | Freq: Every day | ORAL | Status: DC

## 2011-05-05 NOTE — Progress Notes (Signed)
Subjective:    Patient ID: [name], female    DOB: 1956-06-15, 55 y.o.   MRN: [id number]  HPI   Here with 2 wks acute onset fever, facial pain, pressure, general weakness and malaise, and greenish d/c, with slight ST, but little to no cough and Pt denies chest pain, increased sob or doe, wheezing, orthopnea, PND, increased LE swelling, palpitations, dizziness or syncope.  Pt denies new neurological symptoms such as new headache, or facial or extremity weakness or numbness  . Pt denies polydipsia, polyuria.  Has had mult med changes per psychiatry recently.   Pt denies fever, wt loss, night sweats, loss of appetite, or other constitutional symptoms except for the above. Overall good compliance with treatment, and good medicine tolerability. Past Medical History  Diagnosis Date  . Depression     bipolar  . OCD (obsessive compulsive disorder)     anxiety  . Migraines   . Brain aneurysm   . Chronic abdominal pain   . History of colonic polyps   . Peripheral neuropathy   . Fibromyalgia   . HLD (hyperlipidemia)   . GERD (gastroesophageal reflux disease)   . IBS (irritable bowel syndrome)     chronic constipation  . Allergic rhinitis   . Gastroparesis   . COPD (chronic obstructive pulmonary disease)    Past Surgical History  Procedure Date  . Appendectomy   . Cholecystectomy   . Vesicovaginal fistula closure w/ tah   . Oophorectomy   . Coil to brain aneurysm   . Back surgery     disc  . Shoulder surgery   . Elbow surgery     reports that she has been smoking.  She does not have any smokeless tobacco history on file. She reports that she does not drink alcohol or use illicit drugs. family history includes Cancer in her father, mother, sister, and unspecified family member; Coronary artery disease in an unspecified family member; Diabetes in an unspecified family member; Hypertension in an unspecified family member; Leukemia in her sister; and Seizures in an unspecified family  member.  There is no history of Colon cancer. Allergies  Allergen Reactions  . Nsaids     REACTION: GI irritation  . Oxycodone Hcl   . Propoxyphene N-Acetaminophen   . Tramadol     REACTION: nausea  . Varenicline Tartrate     REACTION: agitation, depression    Review of Systems Review of Systems  Constitutional: Negative for diaphoresis and unexpected weight change.  HENT: Negative for drooling and tinnitus.   Eyes: Negative for photophobia and visual disturbance.  Respiratory: Negative for choking and stridor.   Gastrointestinal: Negative for vomiting and blood in stool.  Genitourinary: Negative for hematuria and decreased urine volume. .       Objective:   Physical Exam BP 114/78  Pulse 87  Temp(Src) 98.8 F (37.1 C) (Oral)  Wt 162 lb (73.483 kg)  SpO2 99% Physical Exam  VS noted, mild ill Constitutional: Pt appears well-developed and well-nourished.  HENT: Head: Normocephalic.  Right Ear: External ear normal.  Left Ear: External ear normal.  Bilat tm's mild erythema.  Sinus tender bilat.  Pharynx mild erythema Eyes: Conjunctivae and EOM are normal. Pupils are equal, round, and reactive to light.  Neck: Normal range of motion. Neck supple.  Cardiovascular: Normal rate and regular rhythm.   Pulmonary/Chest: Effort normal and breath sounds normal.  Neurological: Pt is alert. No cranial nerve deficit.  Skin: Skin is warm. No erythema.  Psychiatric: Pt behavior is normal. Thought content normal. 1+ nervous, dysphoric    Assessment & Plan:

## 2011-05-05 NOTE — Assessment & Plan Note (Signed)
stable overall by hx and exam, most recent data reviewed with pt, and pt to continue medical treatment as before  SpO2 Readings from Last 3 Encounters:  05/02/11 99%  03/01/11 98%  02/15/11 90%

## 2011-05-05 NOTE — Assessment & Plan Note (Signed)
stable overall by hx and exam, most recent data reviewed with pt, and pt to continue medical treatment as before, to f/u [psychiatry as planned, cont current meds Lab Results  Component Value Date   WBC 11.5* 04/17/2011   HGB 14.6 04/17/2011   HCT 41.7 04/17/2011   PLT 215 04/17/2011   GLUCOSE 103* 04/17/2011   CHOL 236* 02/15/2011   TRIG 104.0 02/15/2011   HDL 53.70 02/15/2011   LDLDIRECT 170.2 02/15/2011   LDLCALC 45 08/29/2010   ALT 16 02/15/2011   AST 16 02/15/2011   NA 141 04/17/2011   K 3.7 04/17/2011   CL 105 04/17/2011   CREATININE 0.89 04/17/2011   BUN 10 04/17/2011   CO2 23 04/17/2011   TSH 1.15 02/15/2011   INR 0.91 04/17/2011   HGBA1C 5.5 08/24/2009

## 2011-05-05 NOTE — Assessment & Plan Note (Signed)
Mild to mod, for antibx course,  to f/u any worsening symptoms or concerns 

## 2011-05-17 ENCOUNTER — Encounter: Payer: Self-pay | Admitting: Internal Medicine

## 2011-05-17 ENCOUNTER — Ambulatory Visit (INDEPENDENT_AMBULATORY_CARE_PROVIDER_SITE_OTHER): Payer: Medicare Other | Admitting: Internal Medicine

## 2011-05-17 VITALS — BP 110/70 | HR 83 | Temp 98.4°F | Ht 65.0 in | Wt 162.8 lb

## 2011-05-17 DIAGNOSIS — M25511 Pain in right shoulder: Secondary | ICD-10-CM

## 2011-05-17 DIAGNOSIS — J449 Chronic obstructive pulmonary disease, unspecified: Secondary | ICD-10-CM

## 2011-05-17 DIAGNOSIS — F411 Generalized anxiety disorder: Secondary | ICD-10-CM

## 2011-05-17 DIAGNOSIS — M25519 Pain in unspecified shoulder: Secondary | ICD-10-CM

## 2011-05-17 DIAGNOSIS — J4489 Other specified chronic obstructive pulmonary disease: Secondary | ICD-10-CM

## 2011-05-17 MED ORDER — OXYCODONE HCL 5 MG PO TABS: 5.0000 mg | ORAL_TABLET | Freq: Four times a day (QID) | ORAL | Status: AC | PRN

## 2011-05-17 MED ORDER — PREDNISONE 10 MG PO TABS: 10.0000 mg | ORAL_TABLET | Freq: Every day | ORAL | Status: AC

## 2011-05-17 MED ORDER — LURASIDONE HCL 40 MG PO TABS: ORAL_TABLET | ORAL | Status: DC

## 2011-05-17 MED ORDER — METHYLPREDNISOLONE ACETATE PF 80 MG/ML IJ SUSP
120.0000 mg | Freq: Once | INTRAMUSCULAR | Status: AC
  Administered 2011-05-17: 120 mg via INTRAMUSCULAR

## 2011-05-17 NOTE — Assessment & Plan Note (Signed)
Very severe, c/w diffuse prob rot cuff tendonitis/bursiit, cant r/o tear;  For now for depomedrol IM, predpack, pain med, and pt requests refer to [facility] ortho

## 2011-05-17 NOTE — Progress Notes (Signed)
Subjective:    Patient ID: [name], female    DOB: 1955/10/13, 55 y.o.   MRN: [id number]  HPI  Here with 10 days onset right neck and shoulder pain, with most pain at th shoulder level;  Was seen approx 5 days ago at the ER for same;  K low and replaced po after vomiting; on new latuda for 2 mo with some constipation new, to see GI in [place]  - a PA (stacy) at [facility] later today, needs to have colonoscopy (needs to be re-scheduled b/c pt got too afraid the first time);  So the right neck and shoulder pain started several days prior to the n/v, and she thinkg n/v may be be subsequent to pain?  Pain now mod to severe (mod at neck, severe to shoulder - 9/10), sharp, direct contact and lifting arm makes worse - becomes stabbing pain, rest and keeping still helps, has been using a sling off and on from the ER. Pain does radaite towards the wrist but none with no movement of arm. Grip strength. Had films in ER in [place]  - no fx.   No unusual activity lately but cleans her house VERY diligently daily  Pt denies chest pain, increased sob or doe, wheezing, orthopnea, PND, increased LE swelling, palpitations, dizziness or syncope.  Pt denies new neurological symptoms such as new headache, or facial or extremity weakness or numbness  Denies worsening depressive symptoms, suicidal ideation, or panic, though has ongoing anxiety, some increased with pain Past Medical History  Diagnosis Date  . Depression     bipolar  . OCD (obsessive compulsive disorder)     anxiety  . Migraines   . Brain aneurysm   . Chronic abdominal pain   . History of colonic polyps   . Peripheral neuropathy   . Fibromyalgia   . HLD (hyperlipidemia)   . GERD (gastroesophageal reflux disease)   . IBS (irritable bowel syndrome)     chronic constipation  . Allergic rhinitis   . Gastroparesis   . COPD (chronic obstructive pulmonary disease)    Past Surgical History  Procedure Date  . Appendectomy   .  Cholecystectomy   . Vesicovaginal fistula closure w/ tah   . Oophorectomy   . Coil to brain aneurysm   . Back surgery     disc  . Shoulder surgery   . Elbow surgery     reports that she has been smoking.  She does not have any smokeless tobacco history on file. She reports that she does not drink alcohol or use illicit drugs. family history includes Cancer in her father, mother, sister, and unspecified family member; Coronary artery disease in an unspecified family member; Diabetes in an unspecified family member; Hypertension in an unspecified family member; Leukemia in her sister; and Seizures in an unspecified family member.  There is no history of Colon cancer. Allergies  Allergen Reactions  . Nsaids     REACTION: GI irritation  . Oxycodone Hcl   . Propoxyphene N-Acetaminophen   . Tramadol     REACTION: nausea  . Varenicline Tartrate     REACTION: agitation, depression   Current Outpatient Prescriptions on File Prior to Visit  Medication Sig Dispense Refill  . aspirin 81 MG EC tablet Take 81 mg by mouth daily.        . Cholecalciferol (VITAMIN D3) 1000 UNITS CAPS Take 1 capsule by mouth daily.        . CRESTOR 20 MG tablet  take 1 tablet by mouth once daily  30 tablet  5  . DULoxetine (CYMBALTA) 60 MG capsule Take 1 capsule (60 mg total) by mouth daily.  30 capsule  11  . KLOR-CON 10 10 MEQ CR tablet take 1 tablet by mouth once daily  30 tablet  8  . pantoprazole (PROTONIX) 40 MG tablet take 1 tablet by mouth once daily  30 tablet  10  . tiotropium (SPIRIVA HANDIHALER) 18 MCG inhalation capsule Place 1 capsule (18 mcg total) into inhaler and inhale daily.  30 capsule  11  . zolpidem (AMBIEN) 10 MG tablet Take 1 tablet (10 mg total) by mouth at bedtime as needed.  30 tablet  5   Review of Systems Review of Systems  Constitutional: Negative for diaphoresis and unexpected weight change.   Musculoskeletal: Negative for gait problem.  Skin: Negative for color change and wound.    Neurological: Negative for tremors and numbness.  Psychiatric/Behavioral: Negative for decreased concentration. The patient is not hyperactive.      Objective:   Physical Exam BP 110/70  Pulse 83  Temp(Src) 98.4 F (36.9 C) (Oral)  Ht 5\' 5"  (1.651 m)  Wt 162 lb 12 oz (73.823 kg)  BMI 27.08 kg/m2  SpO2 96% Physical Exam  VS noted, not ill but marked pain appearing Constitutional: Pt appears well-developed and well-nourished.  HENT: Head: Normocephalic.  Right Ear: External ear normal.  Left Ear: External ear normal.  Eyes: Conjunctivae and EOM are normal. Pupils are equal, round, and reactive to light.  Neck: Normal range of motion. Neck supple.  Cardiovascular: Normal rate and regular rhythm.   Pulmonary/Chest: Effort normal and breath sounds normal.  Neurological: Pt is alert. No cranial nerve deficit. motor/dtr intact Skin: Skin is warm. No erythema.  Psychiatric: Pt behavior is normal. Thought content normal. 1+ nervous Neck supple without mass;  Mild right paracervical and trapezoid tender noted Right shoulder with severe general tender, mild diffuse swelling, and marked decreased ROM - for forward elev and abduction to 20 degrees only, RUE o/w neurovasc intact    Assessment & Plan:

## 2011-05-17 NOTE — Patient Instructions (Addendum)
You had the steroid shot today Take all new medications as prescribed Continue all other medications as before Please keep your appointments with your specialists as you have planned - GI You will be contacted regarding the referral for: [facility]

## 2011-05-18 ENCOUNTER — Encounter: Payer: Self-pay | Admitting: Internal Medicine

## 2011-05-18 NOTE — Assessment & Plan Note (Signed)
stable overall by hx and exam, most recent data reviewed with pt, and pt to continue medical treatment as before  SpO2 Readings from Last 3 Encounters:  05/17/11 96%  05/02/11 99%  03/01/11 98%

## 2011-05-18 NOTE — Assessment & Plan Note (Signed)
Improved overall on new med with situational increase today, Continue all other medications as before, f/u psych as she does

## 2011-07-30 ENCOUNTER — Encounter: Payer: Self-pay | Admitting: Internal Medicine

## 2011-07-30 ENCOUNTER — Other Ambulatory Visit (INDEPENDENT_AMBULATORY_CARE_PROVIDER_SITE_OTHER): Payer: Medicare Other

## 2011-07-30 ENCOUNTER — Ambulatory Visit (INDEPENDENT_AMBULATORY_CARE_PROVIDER_SITE_OTHER): Payer: Medicare Other | Admitting: Internal Medicine

## 2011-07-30 VITALS — BP 98/62 | HR 110 | Temp 99.5°F | Ht 65.0 in | Wt 159.5 lb

## 2011-07-30 DIAGNOSIS — R1030 Lower abdominal pain, unspecified: Secondary | ICD-10-CM

## 2011-07-30 DIAGNOSIS — M545 Low back pain, unspecified: Secondary | ICD-10-CM | POA: Insufficient documentation

## 2011-07-30 DIAGNOSIS — M25569 Pain in unspecified knee: Secondary | ICD-10-CM

## 2011-07-30 DIAGNOSIS — R109 Unspecified abdominal pain: Secondary | ICD-10-CM

## 2011-07-30 DIAGNOSIS — N3946 Mixed incontinence: Secondary | ICD-10-CM

## 2011-07-30 DIAGNOSIS — Z0000 Encounter for general adult medical examination without abnormal findings: Secondary | ICD-10-CM

## 2011-07-30 DIAGNOSIS — R350 Frequency of micturition: Secondary | ICD-10-CM

## 2011-07-30 DIAGNOSIS — M25562 Pain in left knee: Secondary | ICD-10-CM | POA: Insufficient documentation

## 2011-07-30 LAB — URINALYSIS, ROUTINE W REFLEX MICROSCOPIC
Bilirubin Urine: NEGATIVE
Leukocytes, UA: NEGATIVE
Nitrite: NEGATIVE
Specific Gravity, Urine: 1.015 (ref 1.000–1.030)
pH: 5.5 (ref 5.0–8.0)

## 2011-07-30 LAB — POCT URINALYSIS DIPSTICK
Bilirubin, UA: NEGATIVE
Glucose, UA: NEGATIVE
Ketones, UA: NEGATIVE
Leukocytes, UA: NEGATIVE

## 2011-07-30 MED ORDER — HYDROCODONE-ACETAMINOPHEN 5-325 MG PO TABS: ORAL_TABLET | ORAL | Status: DC

## 2011-07-30 MED ORDER — CEPHALEXIN 500 MG PO CAPS: 500.0000 mg | ORAL_CAPSULE | Freq: Four times a day (QID) | ORAL | Status: DC

## 2011-07-30 MED ORDER — CEPHALEXIN 500 MG PO CAPS: 500.0000 mg | ORAL_CAPSULE | Freq: Four times a day (QID) | ORAL | Status: AC

## 2011-07-30 NOTE — Assessment & Plan Note (Signed)
Worse x 4 wks, if not improved with other tx today, will need urology eval,  to f/u any worsening symptoms or concerns

## 2011-07-30 NOTE — Assessment & Plan Note (Signed)
ua dip neg in the office, but o/w exam c/w cystitis, doubt divertiulitis or other though cant completely r/o, for urine cx, tx with cephalexin,  to f/u any worsening symptoms or concerns

## 2011-07-30 NOTE — Progress Notes (Signed)
Subjective:    Patient ID: [name], female    DOB: 07-09-1955, 56 y.o.   MRN: [id number]  HPI  Here to c/o 3-4 days worsening lower abd pain and discomfort, assoc with urinary freq, urgency but no hematuria, n/v, f/c but has worsening lower back pain as well but no bowel or bladder change, fever, wt loss,  worsening LE pain/numbness/weakness, gait change or falls.  Has 4 wks of what sounds like mixed incontinence which is unusual for her, but she is wanting to try to tx infetion first, before seeing urology if possible.  Just moved to [place], and last saw urology over 15 yrs ago.  Does have incidentally have 3-4 mo of what sounds like mod to severe left knee pain, for which she has also been putting off seeing Dr Pill, ortho in [place], even though she recognizes that she will likely need to, b/c even though no swelling, has mod to sever pain, worse to walk/stand and will actually lock on occasion , has almost lost her balance going down stairs but no falls yet.  When the knee locks, gets a marked grinding senstation , and wont extend or flex for several minutes at a time.   Past Medical History  Diagnosis Date  . Depression     bipolar  . OCD (obsessive compulsive disorder)     anxiety  . Migraines   . Brain aneurysm   . Chronic abdominal pain   . History of colonic polyps   . Peripheral neuropathy   . Fibromyalgia   . HLD (hyperlipidemia)   . GERD (gastroesophageal reflux disease)   . IBS (irritable bowel syndrome)     chronic constipation  . Allergic rhinitis   . Gastroparesis   . COPD (chronic obstructive pulmonary disease)    Past Surgical History  Procedure Date  . Appendectomy   . Cholecystectomy   . Vesicovaginal fistula closure w/ tah   . Oophorectomy   . Coil to brain aneurysm   . Back surgery     disc  . Shoulder surgery   . Elbow surgery     reports that she has been smoking.  She does not have any smokeless tobacco history on file. She reports that  she does not drink alcohol or use illicit drugs. family history includes Cancer in her father, mother, sister, and unspecified family member; Coronary artery disease in an unspecified family member; Diabetes in an unspecified family member; Hypertension in an unspecified family member; Leukemia in her sister; and Seizures in an unspecified family member.  There is no history of Colon cancer. Allergies  Allergen Reactions  . Nsaids     REACTION: GI irritation  . Oxycodone Hcl   . Propoxyphene N-Acetaminophen   . Tramadol     REACTION: nausea  . Varenicline Tartrate     REACTION: agitation, depression   Current Outpatient Prescriptions on File Prior to Visit  Medication Sig Dispense Refill  . aspirin 81 MG EC tablet Take 81 mg by mouth daily.        . Cholecalciferol (VITAMIN D3) 1000 UNITS CAPS Take 1 capsule by mouth daily.        . CRESTOR 20 MG tablet take 1 tablet by mouth once daily  30 tablet  5  . cyanocobalamin 1000 MCG tablet Take 100 mcg by mouth daily.        . DULoxetine (CYMBALTA) 60 MG capsule Take 1 capsule (60 mg total) by mouth daily.  30 capsule  11  . KLOR-CON 10 10 MEQ CR tablet take 1 tablet by mouth once daily  30 tablet  8  . Lurasidone HCl 120 MG TABS Take by mouth daily.        . pantoprazole (PROTONIX) 40 MG tablet take 1 tablet by mouth once daily  30 tablet  10  . tiotropium (SPIRIVA HANDIHALER) 18 MCG inhalation capsule Place 1 capsule (18 mcg total) into inhaler and inhale daily.  30 capsule  11  . zolpidem (AMBIEN) 10 MG tablet Take 1 tablet (10 mg total) by mouth at bedtime as needed.  30 tablet  5  . lurasidone (LATUDA) 40 MG TABS 3 tabs by mouth per day  30 tablet  11   Review of Systems Review of Systems  Constitutional: Negative for diaphoresis and unexpected weight change.  HENT: Negative for drooling and tinnitus.   Eyes: Negative for photophobia and visual disturbance.  Respiratory: Negative for choking and stridor.   Gastrointestinal: Negative  for vomiting and blood in stool.  Genitourinary: Negative for hematuria and decreased urine volume.     Objective:   Physical Exam BP 98/62  Pulse 110  Temp(Src) 99.5 F (37.5 C) (Oral)  Ht 5\' 5"  (1.651 m)  Wt 159 lb 8 oz (72.349 kg)  BMI 26.54 kg/m2  SpO2 99% Physical Exam  VS noted Constitutional: Pt appears well-developed and well-nourished.  HENT: Head: Normocephalic.  Right Ear: External ear normal.  Left Ear: External ear normal.  Eyes: Conjunctivae and EOM are normal. Pupils are equal, round, and reactive to light.  Neck: Normal range of motion. Neck supple.  Cardiovascular: Normal rate and regular rhythm.   Pulmonary/Chest: Effort normal and breath sounds normal.  Abd:  Soft, NT, non-distended, + BS except for mod tender to low mid abd without guarding or rebound, no flank tender Neurological: Pt is alert. No cranial nerve deficit. motor/sens/dtr intact to LE, gait intact Left knee with mod crepitus, FROM, NT, no effusion today Skin: Skin is warm. No erythema. No rash Lumbar area with diffuse tender and bilat paravertebral tender more than excepted for cytisit referred pain? No sweling, erythema, rash Psychiatric: Pt behavior is normal. Thought content normal.     Assessment & Plan:

## 2011-07-30 NOTE — Assessment & Plan Note (Signed)
Ongoing chronic with severe episodes assoc with locking symptom;  I d/w pt - likely ? Meniscal tear or severe DJD - I suggested she call Dr Pill, ortho whom she prefers and as she prefers,  to f/u any worsening symptoms or concerns, for pain med for night as this is the worse time of the pain

## 2011-07-30 NOTE — Assessment & Plan Note (Signed)
With more tender today than would be expected for cystiis referred pain, neuro exam no change, for pain med ,  to f/u any worsening symptoms or concerns, may also need to f/u with ortho if persists

## 2011-07-30 NOTE — Patient Instructions (Addendum)
Take all new medications as prescribed - the antibiotic and pain medication The antibiotic was sent to the [facility] Your urine sample will be sent for urine culture today Please call the phone number [phone] (the PhoneTree System) for results of testing in 2-3 days;  When calling, simply dial the number, and when prompted enter the MRN number above (the Medical Record Number) and the # key, then the message should start. You should highly consider making the appt as you mentioned with Dr Pill (ortho, [facility]) for the left knee We may also have to consider urology referral if the accidents do not improve by next visit

## 2011-08-02 ENCOUNTER — Telehealth: Payer: Self-pay

## 2011-08-02 NOTE — Telephone Encounter (Signed)
Done per emr 

## 2011-08-02 NOTE — Telephone Encounter (Signed)
Informed patient

## 2011-08-02 NOTE — Telephone Encounter (Signed)
Pt called requesting a referral to urology for urinary incontinence.

## 2011-08-12 ENCOUNTER — Telehealth: Payer: Self-pay | Admitting: *Deleted

## 2011-08-12 NOTE — Telephone Encounter (Signed)
Please clarify with pt, does she actually mean oxycodone, as this is the one on her allergy list (not hydrocodone)

## 2011-08-12 NOTE — Telephone Encounter (Signed)
Patient states it is a 5 mg fast acting tablet and makes her very sick. Please advise she will come in for an OV if necessary. She is scheduled to see an ortho. On 08/27/11 and is in a lot of pain needs alternative

## 2011-08-12 NOTE — Telephone Encounter (Signed)
Pt left msg on vm saw md for back problems he rx vicodin. Pt states hse is not able to take vicodin med make her vomit & itch real bad. Has made appt to see back specialist but its not until 08/27/11. Requesting md to rx something else... 08/12/11@3 :25pm/LMB

## 2011-08-12 NOTE — Telephone Encounter (Signed)
I still think she is referring to the oxycodone, which was given to her in sept 2012, to which she refers to as the pill that made her sick  OK to try the hydrocodone, as I dont see where she has been prescribed this in the past yr.  I dont think there is need for OV unless she wants to be seen  There is little other option for pain at this point, as she has been intolerant to tramadol and nsaids as well

## 2011-08-13 ENCOUNTER — Telehealth: Payer: Self-pay

## 2011-08-13 MED ORDER — OXYCODONE HCL 5 MG PO TABS: 5.0000 mg | ORAL_TABLET | Freq: Four times a day (QID) | ORAL | Status: AC | PRN

## 2011-08-13 MED ORDER — HYDROCODONE-ACETAMINOPHEN 5-325 MG PO TABS: ORAL_TABLET | ORAL | Status: DC

## 2011-08-13 NOTE — Telephone Encounter (Signed)
Faxed hardcopy to pharmacy and informed the patient 

## 2011-08-13 NOTE — Telephone Encounter (Signed)
Called the patient is ok to send hydrocodone to [facility] on [address].

## 2011-08-13 NOTE — Telephone Encounter (Signed)
Called left message to call back 

## 2011-08-13 NOTE — Telephone Encounter (Signed)
Patient called to inform the Hydrocodone sent in today is the medication she already has and is making her sick.  She found an old bottle of Oxycodone 5/325 and states that is the medication that works better for her please advise

## 2011-08-13 NOTE — Telephone Encounter (Signed)
Stop the hydrocodone  Allergy med list adjusted  Oxycodone  - Done hardcopy to robin

## 2011-08-13 NOTE — Telephone Encounter (Signed)
Done hardcopy to robin  

## 2011-08-14 NOTE — Telephone Encounter (Signed)
Called the patient informed to pickup prescription at the front desk. 

## 2011-08-14 NOTE — Telephone Encounter (Signed)
Called left message to call back 

## 2011-08-21 ENCOUNTER — Other Ambulatory Visit (INDEPENDENT_AMBULATORY_CARE_PROVIDER_SITE_OTHER): Payer: Medicare Other

## 2011-08-21 DIAGNOSIS — F411 Generalized anxiety disorder: Secondary | ICD-10-CM

## 2011-08-21 DIAGNOSIS — Z0000 Encounter for general adult medical examination without abnormal findings: Secondary | ICD-10-CM

## 2011-08-21 DIAGNOSIS — E785 Hyperlipidemia, unspecified: Secondary | ICD-10-CM

## 2011-08-21 LAB — LDL CHOLESTEROL, DIRECT: Direct LDL: 102.7 mg/dL

## 2011-08-21 LAB — CBC WITH DIFFERENTIAL/PLATELET
Basophils Absolute: 0.1 10*3/uL (ref 0.0–0.1)
Eosinophils Absolute: 0.2 10*3/uL (ref 0.0–0.7)
HCT: 42.2 % (ref 36.0–46.0)
Lymphs Abs: 2 10*3/uL (ref 0.7–4.0)
MCHC: 34.2 g/dL (ref 30.0–36.0)
MCV: 97.7 fl (ref 78.0–100.0)
Monocytes Absolute: 0.8 10*3/uL (ref 0.1–1.0)
Neutrophils Relative %: 68.8 % (ref 43.0–77.0)
Platelets: 256 10*3/uL (ref 150.0–400.0)
RDW: 13.7 % (ref 11.5–14.6)
WBC: 9.9 10*3/uL (ref 4.5–10.5)

## 2011-08-21 LAB — BASIC METABOLIC PANEL
BUN: 9 mg/dL (ref 6–23)
Chloride: 101 mEq/L (ref 96–112)
Potassium: 3.8 mEq/L (ref 3.5–5.1)
Sodium: 138 mEq/L (ref 135–145)

## 2011-08-21 LAB — LIPID PANEL
Cholesterol: 173 mg/dL (ref 0–200)
Total CHOL/HDL Ratio: 4
VLDL: 49.6 mg/dL — ABNORMAL HIGH (ref 0.0–40.0)

## 2011-08-21 LAB — HEPATIC FUNCTION PANEL
ALT: 17 U/L (ref 0–35)
AST: 17 U/L (ref 0–37)
Alkaline Phosphatase: 64 U/L (ref 39–117)
Bilirubin, Direct: 0.1 mg/dL (ref 0.0–0.3)
Total Bilirubin: 0.2 mg/dL — ABNORMAL LOW (ref 0.3–1.2)

## 2011-08-21 LAB — TSH: TSH: 0.62 u[IU]/mL (ref 0.35–5.50)

## 2011-08-28 ENCOUNTER — Encounter: Payer: Self-pay | Admitting: Internal Medicine

## 2011-08-28 ENCOUNTER — Ambulatory Visit (INDEPENDENT_AMBULATORY_CARE_PROVIDER_SITE_OTHER): Payer: Medicare Other | Admitting: Internal Medicine

## 2011-08-28 VITALS — BP 104/60 | HR 94 | Temp 98.4°F | Ht 65.0 in | Wt 167.5 lb

## 2011-08-28 DIAGNOSIS — Z0000 Encounter for general adult medical examination without abnormal findings: Secondary | ICD-10-CM

## 2011-08-28 NOTE — Patient Instructions (Signed)
Please follow lower fat diet, try to be more active as you have planned and weight loss Please keep your appointments with your specialists as you have planned - orthopedic for the back and knee Continue all other medications as before Please have the pharmacy call if you need refills Please return in 1 year for your yearly visit, or sooner if needed, with Lab testing done 3-5 days before

## 2011-08-28 NOTE — Assessment & Plan Note (Signed)

## 2011-08-28 NOTE — Progress Notes (Signed)
Subjective:    Patient ID: [name], female    DOB: Jun 11, 1956, 56 y.o.   MRN: [id number]  HPI  Here for wellness and f/u;  Overall doing ok;  Pt denies CP, worsening SOB, DOE, wheezing, orthopnea, PND, worsening LE edema, palpitations, dizziness or syncope.  Pt denies neurological change such as new Headache, facial or extremity weakness.  Pt denies polydipsia, polyuria, or low sugar symptoms. Pt states overall good compliance with treatment and medications, good tolerability, and trying to follow lower cholesterol diet.  Pt denies worsening depressive symptoms, suicidal ideation or panic. No fever, wt loss, night sweats, loss of appetite, or other constitutional symptoms, in fact has gained wt from 159 to 167.  Pt states good ability with ADL's, low fall risk, home safety reviewed and adequate, no significant changes in hearing or vision, and occasionally active with exercise.  Did join the [employer] to be more active , has occas LBP that slows her down, much worse recently in fact and also left knee pain , for which she sees separate orthopeidc for the back and knee at [facility] (Dr Lorenso Courier and Dr Pill).  She has been ordered MRI LS Spine, not yet done.  No recent falls, though she lives on 2nd floor apartment.  Has also seen [facility] with recent start dexilant, as well as colonoscopy. Due to see GYN next mo for pap, last done about 5 yrs.   Past Medical History  Diagnosis Date  . Depression     bipolar  . OCD (obsessive compulsive disorder)     anxiety  . Migraines   . Brain aneurysm   . Chronic abdominal pain   . History of colonic polyps   . Peripheral neuropathy   . Fibromyalgia   . HLD (hyperlipidemia)   . GERD (gastroesophageal reflux disease)   . IBS (irritable bowel syndrome)     chronic constipation  . Allergic rhinitis   . Gastroparesis   . COPD (chronic obstructive pulmonary disease)    Past Surgical History  Procedure Date  . Appendectomy   .  Cholecystectomy   . Vesicovaginal fistula closure w/ tah   . Oophorectomy   . Coil to brain aneurysm   . Back surgery     disc  . Shoulder surgery   . Elbow surgery     reports that she has been smoking.  She does not have any smokeless tobacco history on file. She reports that she does not drink alcohol or use illicit drugs. family history includes Cancer in her father, mother, sister, and unspecified family member; Coronary artery disease in an unspecified family member; Diabetes in an unspecified family member; Hypertension in an unspecified family member; Leukemia in her sister; and Seizures in an unspecified family member.  There is no history of Colon cancer. Allergies  Allergen Reactions  . Hydrocodone Nausea Only  . Nsaids     REACTION: GI irritation  . Propoxyphene N-Acetaminophen   . Tramadol     REACTION: nausea  . Varenicline Tartrate     REACTION: agitation, depression   Current Outpatient Prescriptions on File Prior to Visit  Medication Sig Dispense Refill  . aspirin 81 MG EC tablet Take 81 mg by mouth daily.        . Cholecalciferol (VITAMIN D3) 1000 UNITS CAPS Take 1 capsule by mouth daily.        . CRESTOR 20 MG tablet take 1 tablet by mouth once daily  30 tablet  5  .  cyanocobalamin 1000 MCG tablet Take 100 mcg by mouth daily.        . DULoxetine (CYMBALTA) 60 MG capsule Take 1 capsule (60 mg total) by mouth daily.  30 capsule  11  . KLOR-CON 10 10 MEQ CR tablet take 1 tablet by mouth once daily  30 tablet  8  . Lurasidone HCl 120 MG TABS Take by mouth daily.        Marland Kitchen tiotropium (SPIRIVA HANDIHALER) 18 MCG inhalation capsule Place 1 capsule (18 mcg total) into inhaler and inhale daily.  30 capsule  11  . zolpidem (AMBIEN) 10 MG tablet Take 1 tablet (10 mg total) by mouth at bedtime as needed.  30 tablet  5   Review of Systems Review of Systems  Constitutional: Negative for diaphoresis, activity change, appetite change and unexpected weight change.  HENT:  Negative for hearing loss, ear pain, facial swelling, mouth sores and neck stiffness.   Eyes: Negative for pain, redness and visual disturbance.  Respiratory: Negative for shortness of breath and wheezing.   Cardiovascular: Negative for chest pain and palpitations.  Gastrointestinal: Negative for diarrhea, blood in stool, abdominal distention and rectal pain.  Genitourinary: Negative for hematuria, flank pain and decreased urine volume.  Musculoskeletal: Negative for myalgias and joint swelling.  Skin: Negative for color change and wound.  Neurological: Negative for syncope and numbness.  Hematological: Negative for adenopathy.  Psychiatric/Behavioral: Negative for hallucinations, self-injury, decreased concentration and agitation.  \     Objective:   Physical Exam BP 104/60  Pulse 94  Temp(Src) 98.4 F (36.9 C) (Oral)  Ht 5\' 5"  (1.651 m)  Wt 167 lb 8 oz (75.978 kg)  BMI 27.87 kg/m2  SpO2 94% Physical Exam  VS noted Constitutional: Pt is oriented to person, place, and time. Appears well-developed and well-nourished.  HENT:  Head: Normocephalic and atraumatic.  Right Ear: External ear normal.  Left Ear: External ear normal.  Nose: Nose normal.  Mouth/Throat: Oropharynx is clear and moist.  Eyes: Conjunctivae and EOM are normal. Pupils are equal, round, and reactive to light.  Neck: Normal range of motion. Neck supple. No JVD present. No tracheal deviation present.  Cardiovascular: Normal rate, regular rhythm, normal heart sounds and intact distal pulses.   Pulmonary/Chest: Effort normal and breath sounds normal.  Abdominal: Soft. Bowel sounds are normal. There is no tenderness.  Musculoskeletal: Normal range of motion. Exhibits no edema.  Lymphadenopathy:  Has no cervical adenopathy.  Neurological: Pt is alert and oriented to person, place, and time. Pt has normal reflexes. No cranial nerve deficit.  Skin: Skin is warm and dry. No rash noted.  Psychiatric:  Has  normal mood  and affect. Behavior is normal.     Assessment & Plan:

## 2011-09-19 ENCOUNTER — Telehealth: Payer: Self-pay

## 2011-09-19 MED ORDER — OXYCODONE HCL 5 MG PO TABS: 5.0000 mg | ORAL_TABLET | Freq: Three times a day (TID) | ORAL | Status: AC | PRN

## 2011-09-19 NOTE — Telephone Encounter (Signed)
Done hardcopy to robin  

## 2011-09-19 NOTE — Telephone Encounter (Signed)
Patient is requesting pain medication to cover her until she sees her back MD. Please advise call back number is [phone]

## 2011-09-20 ENCOUNTER — Telehealth: Payer: Self-pay

## 2011-09-20 NOTE — Telephone Encounter (Signed)
Called the pharmacist to inform of MD's instructions. The pharmacist informed the patient had taken some of the percocet given by Dr. Lorenso Courier and became nauseated (due to the tylenol, which she stated she was alergic to) and flushed the entire bottle. Please advise on the oxycodone refill

## 2011-09-20 NOTE — Telephone Encounter (Signed)
Pharmacy informed.

## 2011-09-20 NOTE — Telephone Encounter (Signed)
Ok to fill now

## 2011-09-20 NOTE — Telephone Encounter (Signed)
Called the patient left a detailed message that prescription requested is ready for pickup at front desk.

## 2011-09-20 NOTE — Telephone Encounter (Signed)
[facility] Pharmacist called to inform the patient was given on 09/05/2011 percocet 10/325 1 by mouth every 8 hours prn written by Dr. Lorenso Courier for #20 days. The pharmacist states the patient should have 4 days left on prescription if taken as directed. The patient dropped off her oxycodone prescription this AM please advise if ok for the pharmacist to fill as well?

## 2011-09-20 NOTE — Telephone Encounter (Signed)
Ok to fill Sep 23, 2011 please

## 2011-10-25 ENCOUNTER — Telehealth: Payer: Self-pay

## 2011-10-25 MED ORDER — ATORVASTATIN CALCIUM 40 MG PO TABS: 40.0000 mg | ORAL_TABLET | Freq: Every day | ORAL | Status: DC

## 2011-10-25 NOTE — Telephone Encounter (Signed)
Pt called requesting a generic alternative to Crestor for cost savings.

## 2011-10-25 NOTE — Telephone Encounter (Signed)
Left message on answer machine at home #. Medication lipitor 40mg  sent to pharmacy.   sue

## 2011-10-25 NOTE — Telephone Encounter (Signed)
Ok to try change to lipitor 40 mg (which is about the same as crestor 20)  Done per [employer]

## 2012-01-02 ENCOUNTER — Telehealth: Payer: Self-pay

## 2012-01-02 DIAGNOSIS — G43909 Migraine, unspecified, not intractable, without status migrainosus: Secondary | ICD-10-CM

## 2012-01-02 DIAGNOSIS — I671 Cerebral aneurysm, nonruptured: Secondary | ICD-10-CM

## 2012-01-02 NOTE — Telephone Encounter (Signed)
Patient would like a referral to [facility] with Dr. Estella Husk. Call back number is [phone]

## 2012-01-02 NOTE — Telephone Encounter (Signed)
Patient informed of referral (left detailed Message).

## 2012-01-02 NOTE — Telephone Encounter (Signed)
Done per emr 

## 2012-01-16 ENCOUNTER — Other Ambulatory Visit: Payer: Self-pay | Admitting: Internal Medicine

## 2012-02-20 ENCOUNTER — Ambulatory Visit (INDEPENDENT_AMBULATORY_CARE_PROVIDER_SITE_OTHER): Payer: Medicare Other | Admitting: Internal Medicine

## 2012-02-20 ENCOUNTER — Encounter: Payer: Self-pay | Admitting: Internal Medicine

## 2012-02-20 VITALS — BP 112/72 | HR 94 | Temp 97.6°F | Resp 16 | Wt 156.4 lb

## 2012-02-20 DIAGNOSIS — M549 Dorsalgia, unspecified: Secondary | ICD-10-CM

## 2012-02-20 DIAGNOSIS — F411 Generalized anxiety disorder: Secondary | ICD-10-CM

## 2012-02-20 DIAGNOSIS — Z23 Encounter for immunization: Secondary | ICD-10-CM

## 2012-02-20 DIAGNOSIS — J019 Acute sinusitis, unspecified: Secondary | ICD-10-CM | POA: Insufficient documentation

## 2012-02-20 DIAGNOSIS — J449 Chronic obstructive pulmonary disease, unspecified: Secondary | ICD-10-CM

## 2012-02-20 MED ORDER — OXYCODONE HCL 5 MG PO TABS: 5.0000 mg | ORAL_TABLET | ORAL | Status: AC | PRN

## 2012-02-20 MED ORDER — AZITHROMYCIN 250 MG PO TABS: ORAL_TABLET | ORAL | Status: AC

## 2012-02-20 NOTE — Patient Instructions (Addendum)
You had the flu shot today Take all new medications as prescribed Continue all other medications as before Please keep your appointments with your specialists as you have planned  - orthopedic

## 2012-02-22 ENCOUNTER — Encounter: Payer: Self-pay | Admitting: Internal Medicine

## 2012-02-22 NOTE — Assessment & Plan Note (Signed)
Acute on chronic pain, for limited pain med, f/u with ortho as planned, hold on further imaging at this time

## 2012-02-22 NOTE — Assessment & Plan Note (Signed)
stable overall by hx and exam, most recent data reviewed with pt, and pt to continue medical treatment as before SpO2 Readings from Last 3 Encounters:  02/20/12 98%  08/28/11 94%  07/30/11 99%

## 2012-02-22 NOTE — Progress Notes (Signed)
Subjective:    Patient ID: [name], female    DOB: 05/11/1956, 56 y.o.   MRN: [id number]  HPI   Here with 3 days acute onset fever, facial pain, pressure, general weakness and malaise, and greenish d/c, with slight ST, but little to no cough and Pt denies chest pain, increased sob or doe, wheezing, orthopnea, PND, increased LE swelling, palpitations, dizziness or syncope.  Pt denies polydipsia, polyuria. Pt denies new neurological symptoms such as new headache, or facial or extremity weakness or numbness  Pt continues to have recurring LBP without change in severity, bowel or bladder change, fever, wt loss,  worsening LE pain/numbness/weakness, gait change or falls.  Pain is uncontrolled, now about 8/10 in the past 3-4 days, has been seeing [facility]/dr newton, s/p mult ESI but not hleping, nexxt esi due in 4 wks,  [facility] lidoderm no help.   Pt denies fever, wt loss, night sweats, loss of appetite, or other constitutional symptoms except for the above. Denies worsening depressive symptoms, suicidal ideation, or panic, though has ongoing anxiety, not increased recently.  Past Medical History  Diagnosis Date  . Depression     bipolar  . OCD (obsessive compulsive disorder)     anxiety  . Migraines   . Brain aneurysm   . Chronic abdominal pain   . History of colonic polyps   . Peripheral neuropathy   . Fibromyalgia   . HLD (hyperlipidemia)   . GERD (gastroesophageal reflux disease)   . IBS (irritable bowel syndrome)     chronic constipation  . Allergic rhinitis   . Gastroparesis   . COPD (chronic obstructive pulmonary disease)    Past Surgical History  Procedure Date  . Appendectomy   . Cholecystectomy   . Vesicovaginal fistula closure w/ tah   . Oophorectomy   . Coil to brain aneurysm   . Back surgery     disc  . Shoulder surgery   . Elbow surgery     reports that she has been smoking.  She does not have any smokeless tobacco history on file. She reports that she does  not drink alcohol or use illicit drugs. family history includes Cancer in her father, mother, sister, and unspecified family member; Coronary artery disease in an unspecified family member; Diabetes in an unspecified family member; Hypertension in an unspecified family member; Leukemia in her sister; and Seizures in an unspecified family member.  There is no history of Colon cancer. Allergies  Allergen Reactions  . Hydrocodone Nausea Only  . Nsaids     REACTION: GI irritation  . Propoxyphene-Acetaminophen   . Tramadol     REACTION: nausea  . Varenicline Tartrate     REACTION: agitation, depression   Current Outpatient Prescriptions on File Prior to Visit  Medication Sig Dispense Refill  . aspirin 81 MG EC tablet Take 81 mg by mouth daily.        Marland Kitchen atorvastatin (LIPITOR) 40 MG tablet Take 1 tablet (40 mg total) by mouth daily.  90 tablet  3  . Cholecalciferol (VITAMIN D3) 1000 UNITS CAPS Take 1 capsule by mouth daily.        . cyanocobalamin 1000 MCG tablet Take 100 mcg by mouth daily.        Marland Kitchen doxepin (SINEQUAN) 25 MG capsule Take 25 mg by mouth at bedtime.      . DULoxetine (CYMBALTA) 60 MG capsule Take 1 capsule (60 mg total) by mouth daily.  30 capsule  11  .  KLOR-CON 10 10 MEQ CR tablet take 1 tablet by mouth once daily  30 tablet  8  . lithium 600 MG capsule Take 600 mg by mouth 3 (three) times daily with meals.      . meclizine (ANTIVERT) 25 MG tablet Take 25 mg by mouth 2 (two) times daily.      Marland Kitchen omeprazole (PRILOSEC) 40 MG capsule Take 40 mg by mouth daily.      . ondansetron (ZOFRAN) 4 MG tablet Take 4 mg by mouth 4 (four) times daily.      . potassium chloride (K-DUR,KLOR-CON) 10 MEQ tablet TAKE ONE TABLET BY MOUTH EVERY DAY  30 tablet  8  . zolpidem (AMBIEN) 10 MG tablet Take 1 tablet (10 mg total) by mouth at bedtime as needed.  30 tablet  5  . dexlansoprazole (DEXILANT) 60 MG capsule Take 60 mg by mouth daily.      Marland Kitchen Linaclotide (LINZESS) 290 MCG CAPS Take by mouth daily.       . Lurasidone HCl 120 MG TABS Take by mouth daily.        Marland Kitchen tiotropium (SPIRIVA HANDIHALER) 18 MCG inhalation capsule Place 1 capsule (18 mcg total) into inhaler and inhale daily.  30 capsule  11   Review of Systems Constitutional: Negative for diaphoresis and unexpected weight change.  HENT: Negative for drooling and tinnitus.   Eyes: Negative for photophobia and visual disturbance.  Respiratory: Negative for choking and stridor.   Gastrointestinal: Negative for vomiting and blood in stool.  Genitourinary: Negative for hematuria and decreased urine volume.  Musculoskeletal: Negative for acute joint swelling  Skin: Negative for color change and wound.  Neurological: Negative for tremors and numbness.  Psychiatric/Behavioral: Negative for decreased concentration. The patient is not hyperactive.      Objective:   Physical Exam BP 112/72  Pulse 94  Temp 97.6 F (36.4 C) (Oral)  Resp 16  Wt 156 lb 6 oz (70.931 kg)  SpO2 98% Physical Exam  VS noted, mild ill Constitutional: Pt appears well-developed and well-nourished.  HENT: Head: Normocephalic.  Right Ear: External ear normal.  Left Ear: External ear normal.  Bilat tm's mild erythema.  Sinus tender bilat.  Pharynx mild erythema Eyes: Conjunctivae and EOM are normal. Pupils are equal, round, and reactive to light.  Neck: Normal range of motion. Neck supple.  Cardiovascular: Normal rate and regular rhythm.   Pulmonary/Chest: Effort normal and breath sounds normal.  Abd:  Soft, NT, non-distended, + BS Neurological: Pt is alert. Not confused, motor/dtr/gait intact Spine diffuse lower lumbar tender.  Skin: Skin is warm. No erythema. No rash Psychiatric: Pt behavior is normal. Thought content normal. 1+ nervous    Assessment & Plan:

## 2012-02-22 NOTE — Assessment & Plan Note (Signed)
Mild to mod, for antibx course,  to f/u any worsening symptoms or concerns 

## 2012-02-22 NOTE — Assessment & Plan Note (Signed)
stable overall by hx and exam, most recent data reviewed with pt, and pt to continue medical treatment as before Lab Results  Component Value Date   WBC 9.9 08/21/2011   HGB 14.4 08/21/2011   HCT 42.2 08/21/2011   PLT 256.0 08/21/2011   GLUCOSE 93 08/21/2011   CHOL 173 08/21/2011   TRIG 248.0* 08/21/2011   HDL 48.90 08/21/2011   LDLDIRECT 102.7 08/21/2011   LDLCALC 45 08/29/2010   ALT 17 08/21/2011   AST 17 08/21/2011   NA 138 08/21/2011   K 3.8 08/21/2011   CL 101 08/21/2011   CREATININE 1.0 08/21/2011   BUN 9 08/21/2011   CO2 28 08/21/2011   TSH 0.62 08/21/2011   INR 0.91 04/17/2011   HGBA1C 5.5 08/24/2009

## 2012-03-02 ENCOUNTER — Ambulatory Visit (INDEPENDENT_AMBULATORY_CARE_PROVIDER_SITE_OTHER): Payer: Medicare Other | Admitting: Internal Medicine

## 2012-03-02 ENCOUNTER — Encounter: Payer: Self-pay | Admitting: Internal Medicine

## 2012-03-02 ENCOUNTER — Telehealth: Payer: Self-pay | Admitting: Internal Medicine

## 2012-03-02 VITALS — BP 112/80 | HR 93 | Temp 97.6°F | Ht 65.0 in | Wt 156.5 lb

## 2012-03-02 DIAGNOSIS — F3289 Other specified depressive episodes: Secondary | ICD-10-CM

## 2012-03-02 DIAGNOSIS — R062 Wheezing: Secondary | ICD-10-CM

## 2012-03-02 DIAGNOSIS — F329 Major depressive disorder, single episode, unspecified: Secondary | ICD-10-CM

## 2012-03-02 DIAGNOSIS — J209 Acute bronchitis, unspecified: Secondary | ICD-10-CM | POA: Insufficient documentation

## 2012-03-02 MED ORDER — PREDNISONE 10 MG PO TABS: ORAL_TABLET | ORAL | Status: DC

## 2012-03-02 MED ORDER — LEVOFLOXACIN 250 MG PO TABS: 250.0000 mg | ORAL_TABLET | Freq: Every day | ORAL | Status: DC

## 2012-03-02 MED ORDER — HYDROCODONE-HOMATROPINE 5-1.5 MG/5ML PO SYRP: 5.0000 mL | ORAL_SOLUTION | Freq: Four times a day (QID) | ORAL | Status: DC | PRN

## 2012-03-02 NOTE — Assessment & Plan Note (Signed)
Mild to mod, for antibx course,  to f/u any worsening symptoms or concerns 

## 2012-03-02 NOTE — Assessment & Plan Note (Signed)
Mild to mod, for antibx course,  to f/u any worsening symptoms or concerns Lab Results  Component Value Date   WBC 9.9 08/21/2011   HGB 14.4 08/21/2011   HCT 42.2 08/21/2011   PLT 256.0 08/21/2011   GLUCOSE 93 08/21/2011   CHOL 173 08/21/2011   TRIG 248.0* 08/21/2011   HDL 48.90 08/21/2011   LDLDIRECT 102.7 08/21/2011   LDLCALC 45 08/29/2010   ALT 17 08/21/2011   AST 17 08/21/2011   NA 138 08/21/2011   K 3.8 08/21/2011   CL 101 08/21/2011   CREATININE 1.0 08/21/2011   BUN 9 08/21/2011   CO2 28 08/21/2011   TSH 0.62 08/21/2011   INR 0.91 04/17/2011   HGBA1C 5.5 08/24/2009

## 2012-03-02 NOTE — Assessment & Plan Note (Signed)
Mild to mod, for predpack  course,  to f/u any worsening symptoms or concerns 

## 2012-03-02 NOTE — Patient Instructions (Addendum)
Take all new medications as prescribed Continue all other medications as before You can continue the Mucinex D as you do

## 2012-03-02 NOTE — Progress Notes (Signed)
Subjective:    Patient ID: [name], female    DOB: Sep 24, 1955, 56 y.o.   MRN: [id number]  HPI Here with acute onset mild to mod 2-3 days ST, HA, general weakness and malaise, with prod cough greenish sputum, but Pt denies chest pain, increased sob or doe, wheezing, orthopnea, PND, increased LE swelling, palpitations, dizziness or syncope except for onset mild wheeze/sob since last PM.  Sinus symptoms recent have improved.  Pt denies new neurological symptoms such as new headache, or facial or extremity weakness or numbness   Pt denies polydipsia, polyuria. Denies worsening depressive symptoms, suicidal ideation, or panic, though has ongoing anxiety, stable recently. Past Medical History  Diagnosis Date  . Depression     bipolar  . OCD (obsessive compulsive disorder)     anxiety  . Migraines   . Brain aneurysm   . Chronic abdominal pain   . History of colonic polyps   . Peripheral neuropathy   . Fibromyalgia   . HLD (hyperlipidemia)   . GERD (gastroesophageal reflux disease)   . IBS (irritable bowel syndrome)     chronic constipation  . Allergic rhinitis   . Gastroparesis   . COPD (chronic obstructive pulmonary disease)    Past Surgical History  Procedure Date  . Appendectomy   . Cholecystectomy   . Vesicovaginal fistula closure w/ tah   . Oophorectomy   . Coil to brain aneurysm   . Back surgery     disc  . Shoulder surgery   . Elbow surgery     reports that she has been smoking.  She does not have any smokeless tobacco history on file. She reports that she does not drink alcohol or use illicit drugs. family history includes Cancer in her father, mother, sister, and unspecified family member; Coronary artery disease in an unspecified family member; Diabetes in an unspecified family member; Hypertension in an unspecified family member; Leukemia in her sister; and Seizures in an unspecified family member.  There is no history of Colon cancer. Allergies  Allergen Reactions    . Hydrocodone Nausea Only  . Nsaids     REACTION: GI irritation  . Propoxyphene-Acetaminophen   . Tramadol     REACTION: nausea  . Varenicline Tartrate     REACTION: agitation, depression   Current Outpatient Prescriptions on File Prior to Visit  Medication Sig Dispense Refill  . aspirin 81 MG EC tablet Take 81 mg by mouth daily.        Marland Kitchen atorvastatin (LIPITOR) 40 MG tablet Take 1 tablet (40 mg total) by mouth daily.  90 tablet  3  . Cholecalciferol (VITAMIN D3) 1000 UNITS CAPS Take 1 capsule by mouth daily.        . cyanocobalamin 1000 MCG tablet Take 100 mcg by mouth daily.        Marland Kitchen dexlansoprazole (DEXILANT) 60 MG capsule Take 60 mg by mouth daily.      Marland Kitchen doxepin (SINEQUAN) 25 MG capsule Take 25 mg by mouth at bedtime.      Marland Kitchen KLOR-CON 10 10 MEQ CR tablet take 1 tablet by mouth once daily  30 tablet  8  . lithium 600 MG capsule Take 600 mg by mouth at bedtime.       . Lurasidone HCl 120 MG TABS Take by mouth daily.        . meclizine (ANTIVERT) 25 MG tablet Take 25 mg by mouth 2 (two) times daily.      Marland Kitchen omeprazole (PRILOSEC)  40 MG capsule Take 40 mg by mouth daily.      . ondansetron (ZOFRAN) 4 MG tablet Take 4 mg by mouth 4 (four) times daily.      . potassium chloride (K-DUR,KLOR-CON) 10 MEQ tablet TAKE ONE TABLET BY MOUTH EVERY DAY  30 tablet  8  . zolpidem (AMBIEN) 10 MG tablet Take 1 tablet (10 mg total) by mouth at bedtime as needed.  30 tablet  5  . DULoxetine (CYMBALTA) 60 MG capsule Take 1 capsule (60 mg total) by mouth daily.  30 capsule  11  . oxyCODONE (OXY IR/ROXICODONE) 5 MG immediate release tablet Take 1 tablet (5 mg total) by mouth every 4 (four) hours as needed for pain.  60 tablet  0  . tiotropium (SPIRIVA HANDIHALER) 18 MCG inhalation capsule Place 1 capsule (18 mcg total) into inhaler and inhale daily.  30 capsule  11   Review of Systems  Constitutional: Negative for diaphoresis and unexpected weight change.  HENT: Negative for tinnitus.   Eyes: Negative for  photophobia and visual disturbance.  Respiratory: Negative for choking and stridor.   Genitourinary: Negative for hematuria and decreased urine volume.  Musculoskeletal: Negative for gait problem.  Skin: Negative for color change and wound.  Pychiatric/Behavioral: Negative for decreased concentration. The patient is not hyperactive.      Objective:   Physical Exam BP 112/80  Pulse 93  Temp 97.6 F (36.4 C) (Oral)  Ht 5\' 5"  (1.651 m)  Wt 156 lb 8 oz (70.988 kg)  BMI 26.04 kg/m2  SpO2 97% Physical Exam  VS noted, mild ill Constitutional: Pt appears well-developed and well-nourished.  HENT: Head: Normocephalic.  Right Ear: External ear normal.  Left Ear: External ear normal.  Bilat tm's mild erythema.  Sinus nontender.  Pharynx mild erythema Eyes: Conjunctivae and EOM are normal. Pupils are equal, round, and reactive to light.  Neck: Normal range of motion. Neck supple.  Cardiovascular: Normal rate and regular rhythm.   Pulmonary/Chest: Effort normal and breath sounds decreased with bilat few wheezes Neurological: Pt is alert. Not confused  Skin: Skin is warm. No erythema.  Psychiatric: Pt behavior is normal. Thought content normal. not depressed affect    Assessment & Plan:

## 2012-03-02 NOTE — Telephone Encounter (Signed)
Caller: Shatira/Patient; Patient Name: [name]; PCP: Oliver Barre (Adults only); Best Callback Phone Number: [phone].  Pt was seen in the office 02/20/12 for sinus infection.  Pt was given Z-pak. After completing the Z-pak, pt still has symptoms of infection and since she had a refill of the Z-pak, she began taking the Z-pak again but still states she needs something for a cough and headache.  Pt is afebrile. Triaged patient per Upper Respiratory Infection Protocol.  See Provider Within 24 hours disposition  for 'Mild to moderate headache for more than 24 hours unrelieved with nonprescription medications'.  Home care advice given and appt scheduled for 4:15 today (03/02/12) with Dr Jonny Ruiz.

## 2012-03-12 ENCOUNTER — Telehealth: Payer: Self-pay | Admitting: Internal Medicine

## 2012-03-12 ENCOUNTER — Other Ambulatory Visit (HOSPITAL_COMMUNITY): Payer: Self-pay | Admitting: Interventional Radiology

## 2012-03-12 ENCOUNTER — Telehealth (HOSPITAL_COMMUNITY): Payer: Self-pay

## 2012-03-12 DIAGNOSIS — I729 Aneurysm of unspecified site: Secondary | ICD-10-CM

## 2012-03-12 MED ORDER — OXYCODONE HCL 5 MG PO TABS: ORAL_TABLET | ORAL | Status: DC

## 2012-03-12 NOTE — Telephone Encounter (Signed)
Caller: Verlinda/Patient; Phone: [phone]; Reason for Call: Patient calling to get a refill on her pain medication.  States she was given Oxycodone 5mg , states it did not help with the pain in her back.  Wants to know if the doctor could prescribe something different.  Please call her back.  Thanks

## 2012-03-12 NOTE — Telephone Encounter (Signed)
rx for oxycodone = Done hardcopy to robin

## 2012-03-12 NOTE — Telephone Encounter (Signed)
Called left message to call back 

## 2012-03-12 NOTE — Telephone Encounter (Signed)
Unfortunately there is nothing to change to given her allergy list, and I would not feel comfortable with higher strength  Has she seen orthopedic?

## 2012-03-12 NOTE — Telephone Encounter (Signed)
I spoke with [name] in regards to scheduling her F/U angio.  I gave her the date and time.  She is still currently taking ASA 81mg .  She had a good understanding of the apt.

## 2012-03-12 NOTE — Telephone Encounter (Signed)
Called the patient informed of MD information.  She is seeing Dr. Alvester Morin at [facility]. And she is waiting on appt. For injections in her back. The patient did state if the injections do not work that Dr. Alvester Morin will refer her to a pain clinic.

## 2012-03-13 NOTE — Telephone Encounter (Signed)
Patient informed to pickup hardcopy at front desk

## 2012-03-18 ENCOUNTER — Other Ambulatory Visit: Payer: Self-pay | Admitting: Physician Assistant

## 2012-03-18 ENCOUNTER — Other Ambulatory Visit: Payer: Self-pay | Admitting: Radiology

## 2012-03-24 ENCOUNTER — Encounter (HOSPITAL_COMMUNITY): Payer: Self-pay | Admitting: Pharmacist

## 2012-03-27 ENCOUNTER — Other Ambulatory Visit (HOSPITAL_COMMUNITY): Payer: Self-pay | Admitting: Interventional Radiology

## 2012-03-27 ENCOUNTER — Ambulatory Visit (HOSPITAL_COMMUNITY)
Admission: RE | Admit: 2012-03-27 | Discharge: 2012-03-27 | Disposition: A | Discharge status: Home / Self Care | Payer: Medicare Other | Source: Ambulatory Visit | Attending: Interventional Radiology | Admitting: Interventional Radiology

## 2012-03-27 DIAGNOSIS — R51 Headache: Secondary | ICD-10-CM | POA: Insufficient documentation

## 2012-03-27 DIAGNOSIS — J4489 Other specified chronic obstructive pulmonary disease: Secondary | ICD-10-CM | POA: Insufficient documentation

## 2012-03-27 DIAGNOSIS — Z09 Encounter for follow-up examination after completed treatment for conditions other than malignant neoplasm: Secondary | ICD-10-CM | POA: Insufficient documentation

## 2012-03-27 DIAGNOSIS — Z79899 Other long term (current) drug therapy: Secondary | ICD-10-CM | POA: Insufficient documentation

## 2012-03-27 DIAGNOSIS — J449 Chronic obstructive pulmonary disease, unspecified: Secondary | ICD-10-CM | POA: Insufficient documentation

## 2012-03-27 DIAGNOSIS — F17200 Nicotine dependence, unspecified, uncomplicated: Secondary | ICD-10-CM | POA: Insufficient documentation

## 2012-03-27 DIAGNOSIS — IMO0001 Reserved for inherently not codable concepts without codable children: Secondary | ICD-10-CM | POA: Insufficient documentation

## 2012-03-27 DIAGNOSIS — I671 Cerebral aneurysm, nonruptured: Secondary | ICD-10-CM | POA: Insufficient documentation

## 2012-03-27 DIAGNOSIS — Z9889 Other specified postprocedural states: Secondary | ICD-10-CM | POA: Insufficient documentation

## 2012-03-27 DIAGNOSIS — E785 Hyperlipidemia, unspecified: Secondary | ICD-10-CM | POA: Insufficient documentation

## 2012-03-27 DIAGNOSIS — I729 Aneurysm of unspecified site: Secondary | ICD-10-CM

## 2012-03-27 LAB — BASIC METABOLIC PANEL
GFR calc Af Amer: 72 mL/min — ABNORMAL LOW (ref >90)
GFR calc non Af Amer: 62 mL/min — ABNORMAL LOW (ref >90)
Glucose, Bld: 98 mg/dL (ref 70–99)
Potassium: 4.1 mEq/L (ref 3.5–5.1)
Sodium: 139 mEq/L (ref 135–145)

## 2012-03-27 LAB — CBC WITH DIFFERENTIAL/PLATELET
Hemoglobin: 15.2 g/dL — ABNORMAL HIGH (ref 12.0–15.0)
Lymphocytes Relative: 20 % (ref 12–46)
Lymphs Abs: 2.4 10*3/uL (ref 0.7–4.0)
MCH: 33.2 pg (ref 26.0–34.0)
Monocytes Relative: 7 % (ref 3–12)
Neutro Abs: 8.4 10*3/uL — ABNORMAL HIGH (ref 1.7–7.7)
Neutrophils Relative %: 69 % (ref 43–77)
RBC: 4.58 MIL/uL (ref 3.87–5.11)

## 2012-03-27 LAB — PROTIME-INR: INR: 0.93 (ref 0.00–1.49)

## 2012-03-27 MED ORDER — FENTANYL CITRATE 0.05 MG/ML IJ SOLN
INTRAMUSCULAR | Status: DC | PRN
  Administered 2012-03-27: 25 ug via INTRAVENOUS
  Administered 2012-03-27: 12.5 ug via INTRAVENOUS

## 2012-03-27 MED ORDER — MIDAZOLAM HCL 2 MG/2ML IJ SOLN
INTRAMUSCULAR | Status: DC | PRN
  Administered 2012-03-27: 0.5 mg via INTRAVENOUS

## 2012-03-27 MED ORDER — MIDAZOLAM HCL 2 MG/2ML IJ SOLN
INTRAMUSCULAR | Status: AC
  Filled 2012-03-27: qty 4

## 2012-03-27 MED ORDER — FENTANYL CITRATE 0.05 MG/ML IJ SOLN
INTRAMUSCULAR | Status: AC
  Filled 2012-03-27: qty 4

## 2012-03-27 MED ORDER — HYDRALAZINE HCL 20 MG/ML IJ SOLN
INTRAMUSCULAR | Status: AC
  Filled 2012-03-27: qty 1

## 2012-03-27 MED ORDER — HEPARIN SOD (PORK) LOCK FLUSH 100 UNIT/ML IV SOLN
INTRAVENOUS | Status: DC | PRN
  Administered 2012-03-27: 500 [IU] via INTRAVENOUS

## 2012-03-27 MED ORDER — SODIUM CHLORIDE 0.9 % IV SOLN
Freq: Once | INTRAVENOUS | Status: AC
  Administered 2012-03-27: 09:00:00 via INTRAVENOUS

## 2012-03-27 MED ORDER — SODIUM CHLORIDE 0.9 % IV SOLN: INTRAVENOUS | Status: AC

## 2012-03-27 MED ORDER — IOHEXOL 300 MG/ML  SOLN
150.0000 mL | Freq: Once | INTRAMUSCULAR | Status: AC | PRN
  Administered 2012-03-27: 70 mL via INTRAVENOUS

## 2012-03-27 MED ORDER — SODIUM CHLORIDE 0.9 % IV BOLUS (SEPSIS)
INTRAVENOUS | Status: DC | PRN
  Administered 2012-03-27: 100 mL via INTRAVENOUS

## 2012-03-27 NOTE — ED Notes (Signed)
Internet working intermittently please see paper document for questions.

## 2012-03-27 NOTE — H&P (Signed)
[name] is an 56 y.o. female.   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  Past Medical History  Diagnosis Date  . Depression     bipolar  . OCD (obsessive compulsive disorder)     anxiety  . Migraines   . Brain aneurysm   . Chronic abdominal pain   . History of colonic polyps   . Peripheral neuropathy   . Fibromyalgia   . HLD (hyperlipidemia)   . GERD (gastroesophageal reflux disease)   . IBS (irritable bowel syndrome)     chronic constipation  . Allergic rhinitis   . Gastroparesis   . COPD (chronic obstructive pulmonary disease)     Past Surgical History  Procedure Date  . Appendectomy   . Cholecystectomy   . Vesicovaginal fistula closure w/ tah   . Oophorectomy   . Coil to brain aneurysm   . Back surgery     disc  . Shoulder surgery   . Elbow surgery     Family History  Problem Relation Age of Onset  . Coronary artery disease      family hx  . Cancer      family hx  . Diabetes      DM - family hx  . Hypertension      family hx  . Seizures      family hx  . Colon cancer Neg Hx   . Cancer Father     lung  . Cancer Sister     stomach  . Cancer Mother     brain  . Leukemia Sister    Social History:  reports that she has been smoking.  She does not have any smokeless tobacco history on file. She reports that she does not drink alcohol or use illicit drugs.  Allergies:  Allergies  Allergen Reactions  . Hydrocodone Nausea Only    Can take it if its in a cough syrup   . Nsaids     REACTION: GI irritation  . Propoxyphene-Acetaminophen Itching  . Tramadol     REACTION: nausea  . Varenicline Tartrate     REACTION: agitation, depression, sick to stomach    Current outpatient prescriptions:amoxicillin-clavulanate (AUGMENTIN) 875-125 MG per tablet, Take  1 tablet by mouth 2 (two) times daily. For 7 days, started 03/22/12, Disp: , Rfl: ;  aspirin 81 MG EC tablet, Take 81 mg by mouth daily.  , Disp: , Rfl: ;  atorvastatin (LIPITOR) 40 MG tablet, Take 40 mg by mouth at bedtime., Disp: , Rfl: ;  Cholecalciferol (VITAMIN D3) 1000 UNITS CAPS, Take 1 capsule by mouth daily.  , Disp: , Rfl:  cyanocobalamin 1000 MCG tablet, Take 1,000 mcg by mouth daily. , Disp: , Rfl: ;  doxepin (SINEQUAN) 25 MG capsule, Take 25 mg by mouth at bedtime., Disp: , Rfl: ;  DULoxetine (CYMBALTA) 60 MG capsule, Take 60 mg by mouth daily., Disp: , Rfl: ;  lithium 600 MG capsule, Take 600 mg by mouth at bedtime. , Disp: , Rfl: ;  LORazepam (ATIVAN) 1 MG tablet, Take 1 mg by mouth every 8 (eight) hours as needed. For anxiety, Disp: , Rfl:  Lurasidone HCl (LATUDA) 120 MG TABS, Take 120 mg by mouth at bedtime., Disp: , Rfl: ;  meclizine (ANTIVERT) 25 MG tablet, Take 25 mg by mouth 2 (two) times  daily., Disp: , Rfl: ;  omeprazole (PRILOSEC) 40 MG capsule, Take 40 mg by mouth daily., Disp: , Rfl: ;  oxyCODONE (OXY IR/ROXICODONE) 5 MG immediate release tablet, Take 5-10 mg by mouth every 6 (six) hours as needed. for pain, Disp: , Rfl:  potassium chloride (K-DUR,KLOR-CON) 10 MEQ tablet, Take 10 mEq by mouth daily., Disp: , Rfl: ;  zolpidem (AMBIEN) 10 MG tablet, Take 10 mg by mouth at bedtime., Disp: , Rfl:  Current facility-administered medications:0.9 %  sodium chloride infusion, , Intravenous, Once, Robet Leu, PA, Last Rate: 20 mL/hr at 03/27/12 0845   Results for orders placed during the hospital encounter of 03/27/12 (from the past 48 hour(s))  APTT     Status: Normal   Collection Time   03/27/12  8:41 AM      Component Value Range Comment   aPTT 28  24 - 37 seconds   BASIC METABOLIC PANEL     Status: Abnormal   Collection Time   03/27/12  8:41 AM      Component Value Range Comment   Sodium 139  135 - 145 mEq/L    Potassium 4.1  3.5 - 5.1 mEq/L    Chloride 104  96 - 112 mEq/L     CO2 25  19 - 32 mEq/L    Glucose, Bld 98  70 - 99 mg/dL    BUN 10  6 - 23 mg/dL    Creatinine, Ser 4.40  0.50 - 1.10 mg/dL    Calcium 9.5  8.4 - 10.2 mg/dL    GFR calc non Af Amer 62 (*) >90 mL/min    GFR calc Af Amer 72 (*) >90 mL/min   CBC WITH DIFFERENTIAL     Status: Abnormal   Collection Time   03/27/12  8:41 AM      Component Value Range Comment   WBC 12.2 (*) 4.0 - 10.5 K/uL    RBC 4.58  3.87 - 5.11 MIL/uL    Hemoglobin 15.2 (*) 12.0 - 15.0 g/dL    HCT 72.5  36.6 - 44.0 %    MCV 98.0  78.0 - 100.0 fL    MCH 33.2  26.0 - 34.0 pg    MCHC 33.9  30.0 - 36.0 g/dL    RDW 34.7  42.5 - 95.6 %    Platelets 280  150 - 400 K/uL    Neutrophils Relative 69  43 - 77 %    Neutro Abs 8.4 (*) 1.7 - 7.7 K/uL    Lymphocytes Relative 20  12 - 46 %    Lymphs Abs 2.4  0.7 - 4.0 K/uL    Monocytes Relative 7  3 - 12 %    Monocytes Absolute 0.8  0.1 - 1.0 K/uL    Eosinophils Relative 4  0 - 5 %    Eosinophils Absolute 0.5  0.0 - 0.7 K/uL    Basophils Relative 0  0 - 1 %    Basophils Absolute 0.0  0.0 - 0.1 K/uL   PROTIME-INR     Status: Normal   Collection Time   03/27/12  8:41 AM      Component Value Range Comment   Prothrombin Time 12.4  11.6 - 15.2 seconds    INR 0.93  0.00 - 1.49    No results found.  Review of Systems  Constitutional: Negative for fever and chills.  HENT:       Occ right pariet-occ HA's  Respiratory: Negative for shortness  of breath.        Occ cough  Cardiovascular: Negative for chest pain.  Gastrointestinal: Negative for nausea, vomiting and abdominal pain.  Musculoskeletal: Positive for back pain.  Neurological: Positive for tremors. Negative for sensory change, speech change and focal weakness.  Endo/Heme/Allergies: Does not bruise/bleed easily.    Blood pressure 104/68, pulse 77, temperature 97.3 F (36.3 C), temperature source Oral, resp. rate 18, height 5\' 5"  (1.651 m), weight 156 lb (70.761 kg), SpO2 96.00%. Physical Exam  Constitutional: She is  oriented to person, place, and time. She appears well-developed and well-nourished.  Cardiovascular: Normal rate and regular rhythm.   Respiratory: Effort normal and breath sounds normal.  GI: Soft. Bowel sounds are normal. There is no tenderness.  Musculoskeletal: Normal range of motion. She exhibits no edema.  Neurological: She is alert and oriented to person, place, and time. No cranial nerve deficit. Coordination normal.       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 Plan is for routine follow up  cerebral arteriogram today to assess stability. Details/risks of procedure d/w pt with her understanding and consent.  Amen Dargis,D KEVIN 03/27/2012, 9:39 AM

## 2012-03-27 NOTE — Procedures (Signed)
S/p 4 vessel cerebral arteriogram RT CFA approach. Preliminary findings. 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 2.41mm lt PCom aneurysm

## 2012-03-30 ENCOUNTER — Telehealth (HOSPITAL_COMMUNITY): Payer: Self-pay | Admitting: *Deleted

## 2012-03-30 NOTE — Telephone Encounter (Signed)
Spoke with patient. She states she is doing fine, no problems.

## 2012-04-01 ENCOUNTER — Telehealth (HOSPITAL_COMMUNITY): Payer: Self-pay

## 2012-04-01 NOTE — Telephone Encounter (Signed)
[name] called the office and stated taht she felt light-headed and her balance seemed off after her angio on Friday.  Dr. Corliss Skains advised to make sure she is drinking plenty of water and taking her med.

## 2012-04-20 ENCOUNTER — Telehealth: Payer: Self-pay

## 2012-04-20 MED ORDER — ACETAMINOPHEN-CODEINE 300-30 MG PO TABS: 1.0000 | ORAL_TABLET | Freq: Four times a day (QID) | ORAL | Status: DC | PRN

## 2012-04-20 NOTE — Telephone Encounter (Signed)
Faxed hardcopy to [facility] pharmacy and called the patient left message.

## 2012-04-20 NOTE — Telephone Encounter (Signed)
The patient has 3 teeth that need to be pulled.  She is unable at this time to do so due to the cost.  She is looking into her alternatives.  But at this time is in a lot of pain and would like something called in to her pharmacy, [facility]. Please advise call back number is [phone]

## 2012-04-20 NOTE — Telephone Encounter (Signed)
Pt with mult pain med intolerances  For tylenol #3 prn - Done hardcopy to robin

## 2012-04-30 ENCOUNTER — Encounter: Payer: Self-pay | Admitting: Internal Medicine

## 2012-04-30 ENCOUNTER — Ambulatory Visit (INDEPENDENT_AMBULATORY_CARE_PROVIDER_SITE_OTHER): Payer: Medicare Other | Admitting: Internal Medicine

## 2012-04-30 VITALS — BP 110/72 | HR 94 | Temp 98.4°F | Ht 65.0 in | Wt 156.2 lb

## 2012-04-30 DIAGNOSIS — R259 Unspecified abnormal involuntary movements: Secondary | ICD-10-CM

## 2012-04-30 DIAGNOSIS — F3289 Other specified depressive episodes: Secondary | ICD-10-CM

## 2012-04-30 DIAGNOSIS — J4489 Other specified chronic obstructive pulmonary disease: Secondary | ICD-10-CM

## 2012-04-30 DIAGNOSIS — R251 Tremor, unspecified: Secondary | ICD-10-CM | POA: Insufficient documentation

## 2012-04-30 DIAGNOSIS — F329 Major depressive disorder, single episode, unspecified: Secondary | ICD-10-CM

## 2012-04-30 DIAGNOSIS — J449 Chronic obstructive pulmonary disease, unspecified: Secondary | ICD-10-CM

## 2012-04-30 MED ORDER — METOPROLOL TARTRATE 25 MG PO TABS: ORAL_TABLET | ORAL | Status: DC

## 2012-04-30 NOTE — Assessment & Plan Note (Signed)
stable overall by hx and exam, most recent data reviewed with pt, and pt to continue medical treatment as before BP Readings from Last 3 Encounters:  04/30/12 110/72  03/27/12 102/69  03/02/12 112/80

## 2012-04-30 NOTE — Assessment & Plan Note (Signed)
prob worsening essential tremor now moderate to severe, for metoprolol 12.5 bid, f/u with neuro as planned, no other signficant s/s parkinsons

## 2012-04-30 NOTE — Patient Instructions (Addendum)
Take all new medications as prescribed - the metoprolol 25 mg - HALF pill twice per day Continue all other medications as before No further blood work needed today Thank you for enrolling in MyChart. Please follow the instructions below to securely access your online medical record. MyChart allows you to send messages to your doctor, view your test results, renew your prescriptions, schedule appointments, and more. Please keep your appointments with your specialists as you have planned - your [facility] neurologist

## 2012-04-30 NOTE — Assessment & Plan Note (Signed)
stable overall by hx and exam, most recent data reviewed with pt, and pt to continue medical treatment as before SpO2 Readings from Last 3 Encounters:  04/30/12 99%  03/27/12 95%  03/02/12 97%

## 2012-04-30 NOTE — Progress Notes (Signed)
Subjective:    Patient ID: [name], female    DOB: Aug 27, 1955, 56 y.o.   MRN: [id number]  HPI  Here with bilat hand tremors markedly worsening in the past 3 mo, now unable to use effectively even to eat, drink, write or other similar.   Pt denies chest pain, increased sob or doe, wheezing, orthopnea, PND, increased LE swelling, palpitations, dizziness or syncope.   Pt denies polydipsia, polyuria, or low sugar symptoms such as weakness or confusion improved with po intake.  Pt states overall good compliance with meds, trying to follow lower cholesterol, diabetic diet, wt overall stable but little exercise however.     Pt denies new neurological symptoms such as new headache, or facial or extremity weakness or numbness  Has appt with neurology at dec 11 in [place] but shaking was just too bad.  Denies worsening depressive symptoms, suicidal ideation, or panic, though has ongoing anxiety, not increased recently.    Past Medical History  Diagnosis Date  . Depression     bipolar  . OCD (obsessive compulsive disorder)     anxiety  . Migraines   . Brain aneurysm   . Chronic abdominal pain   . History of colonic polyps   . Peripheral neuropathy   . Fibromyalgia   . HLD (hyperlipidemia)   . GERD (gastroesophageal reflux disease)   . IBS (irritable bowel syndrome)     chronic constipation  . Allergic rhinitis   . Gastroparesis   . COPD (chronic obstructive pulmonary disease)    Past Surgical History  Procedure Date  . Appendectomy   . Cholecystectomy   . Vesicovaginal fistula closure w/ tah   . Oophorectomy   . Coil to brain aneurysm   . Back surgery     disc  . Shoulder surgery   . Elbow surgery     reports that she has been smoking.  She does not have any smokeless tobacco history on file. She reports that she does not drink alcohol or use illicit drugs. family history includes Cancer in her father, mother, sister, and unspecified family member; Coronary artery disease in an  unspecified family member; Diabetes in an unspecified family member; Hypertension in an unspecified family member; Leukemia in her sister; and Seizures in an unspecified family member.  There is no history of Colon cancer. Allergies  Allergen Reactions  . Hydrocodone Nausea Only    Can take it if its in a cough syrup   . Nsaids     REACTION: GI irritation  . Propoxyphene-Acetaminophen Itching  . Tramadol     REACTION: nausea  . Varenicline Tartrate     REACTION: agitation, depression, sick to stomach   Current Outpatient Prescriptions on File Prior to Visit  Medication Sig Dispense Refill  . Acetaminophen-Codeine (TYLENOL/CODEINE #3) 300-30 MG per tablet Take 1 tablet by mouth every 6 (six) hours as needed for pain.  40 tablet  1  . aspirin 81 MG EC tablet Take 81 mg by mouth daily.        Marland Kitchen atorvastatin (LIPITOR) 40 MG tablet Take 40 mg by mouth at bedtime.      . Cholecalciferol (VITAMIN D3) 1000 UNITS CAPS Take 1 capsule by mouth daily.        . cyanocobalamin 1000 MCG tablet Take 1,000 mcg by mouth daily.       . DULoxetine (CYMBALTA) 60 MG capsule Take 60 mg by mouth daily.      Marland Kitchen lithium 600 MG capsule Take  600 mg by mouth at bedtime.       Marland Kitchen LORazepam (ATIVAN) 1 MG tablet Take 1 mg by mouth every 8 (eight) hours as needed. For anxiety      . Lurasidone HCl (LATUDA) 120 MG TABS Take 120 mg by mouth at bedtime.      . meclizine (ANTIVERT) 25 MG tablet Take 25 mg by mouth 2 (two) times daily.      Marland Kitchen omeprazole (PRILOSEC) 40 MG capsule Take 40 mg by mouth daily.      Marland Kitchen oxyCODONE (OXY IR/ROXICODONE) 5 MG immediate release tablet Take 5-10 mg by mouth every 6 (six) hours as needed. for pain      . potassium chloride (K-DUR,KLOR-CON) 10 MEQ tablet Take 10 mEq by mouth daily.      Marland Kitchen zolpidem (AMBIEN) 10 MG tablet Take 10 mg by mouth at bedtime.      Marland Kitchen amoxicillin-clavulanate (AUGMENTIN) 875-125 MG per tablet Take 1 tablet by mouth 2 (two) times daily. For 7 days, started 03/22/12      .  metoprolol tartrate (LOPRESSOR) 25 MG tablet 1/2 tab by mouth twice per day  90 tablet  3   Review of Systems  Constitutional: Negative for diaphoresis and unexpected weight change.  HENT: Negative for tinnitus.   Eyes: Negative for photophobia and visual disturbance.  Respiratory: Negative for choking and stridor.   Gastrointestinal: Negative for vomiting and blood in stool.  Genitourinary: Negative for hematuria and decreased urine volume.  Musculoskeletal: Negative for gait problem.  Skin: Negative for color change and wound.  Neurological: Negative for  numbness.  Psychiatric/Behavioral: Negative for decreased concentration. The patient is not hyperactive.       Objective:   Physical Exam BP 110/72  Pulse 94  Temp 98.4 F (36.9 C) (Oral)  Ht 5\' 5"  (1.651 m)  Wt 156 lb 4 oz (70.875 kg)  BMI 26.00 kg/m2  SpO2 99% Physical Exam  VS noted, not ill appearing Constitutional: Pt appears well-developed and well-nourished.  HENT: Head: Normocephalic.  Right Ear: External ear normal.  Left Ear: External ear normal.  Eyes: Conjunctivae and EOM are normal. Pupils are equal, round, and reactive to light.  Neck: Normal range of motion. Neck supple.  Cardiovascular: Normal rate and regular rhythm.   Pulmonary/Chest: Effort normal and breath sounds normal.  Abd:  Soft, NT, non-distended, + BS Neurological: Pt is alert. Not confused , motor/gait intact, with mod to severe fast amplitude tremor bilat hands left > right Skin: Skin is warm. No erythema.  Psychiatric: Pt behavior is normal. Thought content normal. niot depressed affect    Assessment & Plan:

## 2012-05-07 ENCOUNTER — Telehealth: Payer: Self-pay

## 2012-05-07 MED ORDER — ACETAMINOPHEN-CODEINE 300-30 MG PO TABS: 1.0000 | ORAL_TABLET | Freq: Four times a day (QID) | ORAL | Status: DC | PRN

## 2012-05-07 NOTE — Telephone Encounter (Signed)
The patient called to inform she has not been able to have dental work done due to money.  She is still in pain and would like something stronger than tylenol #3 sent in??

## 2012-05-07 NOTE — Telephone Encounter (Signed)
Done hardcopy to robin  

## 2012-05-08 NOTE — Telephone Encounter (Signed)
Faxed hardcopy to pharmacy and patient informed as well.

## 2012-05-21 ENCOUNTER — Ambulatory Visit (INDEPENDENT_AMBULATORY_CARE_PROVIDER_SITE_OTHER): Payer: Medicare Other | Admitting: Internal Medicine

## 2012-05-21 ENCOUNTER — Encounter: Payer: Self-pay | Admitting: Internal Medicine

## 2012-05-21 VITALS — BP 102/68 | HR 99 | Temp 98.1°F | Ht 65.0 in | Wt 159.4 lb

## 2012-05-21 DIAGNOSIS — J449 Chronic obstructive pulmonary disease, unspecified: Secondary | ICD-10-CM

## 2012-05-21 DIAGNOSIS — M545 Low back pain, unspecified: Secondary | ICD-10-CM | POA: Insufficient documentation

## 2012-05-21 DIAGNOSIS — G8929 Other chronic pain: Secondary | ICD-10-CM

## 2012-05-21 DIAGNOSIS — R259 Unspecified abnormal involuntary movements: Secondary | ICD-10-CM

## 2012-05-21 DIAGNOSIS — R251 Tremor, unspecified: Secondary | ICD-10-CM

## 2012-05-21 MED ORDER — LIDOCAINE 5 % EX PTCH: 1.0000 | MEDICATED_PATCH | Freq: Two times a day (BID) | CUTANEOUS | Status: DC | PRN

## 2012-05-21 MED ORDER — OXYCODONE HCL 5 MG PO TABS: 5.0000 mg | ORAL_TABLET | Freq: Four times a day (QID) | ORAL | Status: DC | PRN

## 2012-05-21 NOTE — Assessment & Plan Note (Signed)
With overall worsening subjective pain for 6 mo, s/p esi x 3; physical exam stable, not currently on other pain med, has been intolerant of several pain meds in past; ok for topical lidoderm, and prn oxycydone, as well as refer to pain clinic as per pt request

## 2012-05-21 NOTE — Progress Notes (Signed)
Subjective:    Patient ID: [name], female    DOB: [birth year], 56 y.o.   MRN: [id number]  HPI  Here to f/u LBP, has been seeing [facility], s/p MRI in mar 2013 with lumbar DJD/DDD, s/p ESI x 3 since then ([facility], but did not like their staff, had 3rd ESI with Dr Franchot Heidelberg); last shot only helped about 20 days;  Currently is Lower back, right buttock and leg, mod to severe, constant, has some LE weakness that sounds variable only really present in the am to first getting up, and no numbness.  [facility], as well as even turning over in bed- has to sit up in bed to then turn over.  Pain worse to bend, and takes much time to bend upright after bending forward, worse to get up from sitting with shooting stabbing pain.  Overall worse in the past 6 mo.  No falls.  Did twist her left ankle with a misstep on a step recent but now getting better.  Pt with nobowel or bladder change, fever, wt loss.  Pt states o/w not treated with nsaid or other, pt does mention a pain clinic in [place] she is aware of.  Also tremor essentially not heleped with recnet med trial, and has appt with neurology on dec 11 in [place] as she now lives in [place]. Was taking tylenol #3 but stopped due to some GI upset.  Plans to also see GI soon, but did get better with zofran. Has been intolerant of several pain meds in the past (see list).  Pt denies chest pain, increased sob or doe, wheezing, orthopnea, PND, increased LE swelling, palpitations, dizziness or syncope. Past Medical History  Diagnosis Date  . Depression     bipolar  . OCD (obsessive compulsive disorder)     anxiety  . Migraines   . Brain aneurysm   . Chronic abdominal pain   . History of colonic polyps   . Peripheral neuropathy   . Fibromyalgia   . HLD (hyperlipidemia)   . GERD (gastroesophageal reflux disease)   . IBS (irritable bowel syndrome)     chronic constipation   . Allergic rhinitis   . Gastroparesis   . COPD (chronic obstructive pulmonary disease)    Past Surgical History  Procedure Date  . Appendectomy   . Cholecystectomy   . Vesicovaginal fistula closure w/ tah   . Oophorectomy   . Coil to brain aneurysm   . Back surgery     disc  . Shoulder surgery   . Elbow surgery     reports that she has been smoking.  She does not have any smokeless tobacco history on file. She reports that she does not drink alcohol or use illicit drugs. family history includes Cancer in her father, mother, sister, and unspecified family member; Coronary artery disease in an unspecified family member; Diabetes in an unspecified family member; Hypertension in an unspecified family member; Leukemia in her sister; and Seizures in an unspecified family member.  There is no history of Colon cancer. Allergies  Allergen Reactions  . Hydrocodone Nausea Only    Can take it if its in a cough syrup   . Nsaids     REACTION: GI irritation  . Propoxyphene-Acetaminophen Itching  . Tramadol     REACTION: nausea  . Varenicline Tartrate     REACTION: agitation, depression, sick to stomach   Current Outpatient Prescriptions  on File Prior to Visit  Medication Sig Dispense Refill  . aspirin 81 MG EC tablet Take 81 mg by mouth daily.        Marland Kitchen atorvastatin (LIPITOR) 40 MG tablet Take 40 mg by mouth at bedtime.      . Cholecalciferol (VITAMIN D3) 1000 UNITS CAPS Take 1 capsule by mouth daily.        . cyanocobalamin 1000 MCG tablet Take 1,000 mcg by mouth daily.       . DULoxetine (CYMBALTA) 60 MG capsule Take 60 mg by mouth daily.      Marland Kitchen lithium 600 MG capsule Take 600 mg by mouth at bedtime.       Marland Kitchen LORazepam (ATIVAN) 1 MG tablet Take 1 mg by mouth every 8 (eight) hours as needed. For anxiety      . Lurasidone HCl (LATUDA) 120 MG TABS Take 120 mg by mouth at bedtime.      . meclizine (ANTIVERT) 25 MG tablet Take 25 mg by mouth 2 (two) times daily.      Marland Kitchen omeprazole (PRILOSEC) 40  MG capsule Take 40 mg by mouth daily.      Marland Kitchen oxyCODONE (OXY IR/ROXICODONE) 5 MG immediate release tablet Take 5-10 mg by mouth every 6 (six) hours as needed. for pain      . potassium chloride (K-DUR,KLOR-CON) 10 MEQ tablet Take 10 mEq by mouth daily.      Marland Kitchen zolpidem (AMBIEN) 10 MG tablet Take 10 mg by mouth at bedtime.       Review of Systems  Constitutional: Negative for diaphoresis and unexpected weight change.  HENT: Negative for tinnitus.   Eyes: Negative for photophobia and visual disturbance.  Respiratory: Negative for choking and stridor.   Gastrointestinal: Negative for vomiting and blood in stool.  Genitourinary: Negative for hematuria and decreased urine volume.  Musculoskeletal: Negative for acute joint swelling Skin: Negative for color change and wound.  Neurological: Negative for numbness.  Psychiatric/Behavioral: Negative for decreased concentration. The patient is not hyperactive.       Objective:   Physical Exam BP 102/68  Pulse 99  Temp 98.1 F (36.7 C) (Oral)  Ht 5\' 5"  (1.651 m)  Wt 159 lb 6 oz (72.292 kg)  BMI 26.52 kg/m2  SpO2 96% Physical Exam  VS noted Constitutional: Pt appears well-developed and well-nourished.  HENT: Head: Normocephalic.  Right Ear: External ear normal.  Left Ear: External ear normal.  Eyes: Conjunctivae and EOM are normal. Pupils are equal, round, and reactive to light.  Neck: Normal range of motion. Neck supple.  Cardiovascular: Normal rate and regular rhythm.   Pulmonary/Chest: Effort normal and breath sounds decreased, no rales or wheezing Abd:  Soft, NT, non-distended, + BS Neurological: Pt is alert. Not confused , lumbar spine marked diffuse spasm/tender, motor/gait essentially intact, has large UE tremor noted again Skin: Skin is warm. No erythema.  Psychiatric: Pt behavior is normal. Thought content normal.     Assessment & Plan:

## 2012-05-21 NOTE — Assessment & Plan Note (Signed)
Did not imrpove with beta blocker, has neuro eval dec 11

## 2012-05-21 NOTE — Assessment & Plan Note (Signed)
stable overall by hx and exam, most recent data reviewed with pt, and pt to continue medical treatment as before SpO2 Readings from Last 3 Encounters:  05/21/12 96%  04/30/12 99%  03/27/12 95%

## 2012-05-21 NOTE — Patient Instructions (Addendum)
Take all new medications as prescribed Continue all other medications as before Please keep your appointments with your specialists as you have planned - neurology You will be contacted regarding the referral for: pain clinic in [facility]

## 2012-05-26 ENCOUNTER — Telehealth: Payer: Self-pay

## 2012-05-26 MED ORDER — LEVOFLOXACIN 250 MG PO TABS: 250.0000 mg | ORAL_TABLET | Freq: Every day | ORAL | Status: DC

## 2012-05-26 NOTE — Telephone Encounter (Signed)
Called pt and let her know that her rx for levaquin was sent in by Dr Jonny Ruiz.

## 2012-05-26 NOTE — Telephone Encounter (Signed)
levaquin done erx

## 2012-05-26 NOTE — Telephone Encounter (Signed)
The patient was seen in the ER in [place].  She has bronchitis and was prescribed a zpack.  She stated she went through 2 zpacks last year for bronchitis and Dr. Jonny Ruiz had to send something else in.  The patient would like another antibiotic sent in.  Also they did a CT scan at the ER and results were abnormal and she was told to recheck in 3 months.  Please advise

## 2012-06-02 ENCOUNTER — Telehealth: Payer: Self-pay | Admitting: Internal Medicine

## 2012-06-02 MED ORDER — OXYCODONE HCL 10 MG PO TABS: ORAL_TABLET | ORAL | Status: DC

## 2012-06-02 NOTE — Telephone Encounter (Signed)
Called the patient left detailed message that prescription requested is ready for pickup at the front desk. 

## 2012-06-02 NOTE — Telephone Encounter (Signed)
Caller: [name]/Patient; Phone: [phone]; Reason for Call: Patient requesting renewal of pain medication.  States has Rx for oxycodone 5mg , and has appt with pain clinic 07/29/12.  States will need renewals for pain Rx for back pain.  Per Epic, last Rx written for #100 tabs 05/21/12; states has been taking 3 tabs q 6h, as she states she functions better with that.  Declines triage; info to office for provider review/Rx/callback.  May reach patient [phone].  Krs/can

## 2012-06-02 NOTE — Telephone Encounter (Signed)
Ok to adjust rx to the above, but I cannot use more than this - Done hardcopy to robin

## 2012-06-03 ENCOUNTER — Telehealth: Payer: Self-pay | Admitting: Internal Medicine

## 2012-06-03 NOTE — Telephone Encounter (Signed)
[name] calling because she has 2 days of Cymbalta (40mg  per day) left.  States she is in the doughnut hole with her insurance and cannot afford to purchase anymore at this time.  States Dr. Jonny Ruiz originally placed her on Cymbalta but Dr. Shawnee Knapp provides it now.  Requesting samples of 20mg  Cymbalta.  Called Dr. Dierdre Forth office and they do not have any.  Wants to know if Dr. Jonny Ruiz has any samples.  PLEASE F/U WITH PT REGARDING SAMPLE REQUEST.

## 2012-06-03 NOTE — Telephone Encounter (Signed)
Pt informed that we currently do not have any samples of Cymbalta at this time.

## 2012-07-02 ENCOUNTER — Telehealth: Payer: Self-pay | Admitting: Internal Medicine

## 2012-07-02 NOTE — Telephone Encounter (Signed)
Patient Information:  Caller Name: [name]  Phone: [phone]  Patient: [name], [name]  Gender: Female  DOB: 23-Jul-1955  Age: 57 Years  PCP: Oliver Barre (Adults only)  Pregnant: No  Office Follow Up:  Does the office need to follow up with this patient?: No  Instructions For The Office: N/A  RN Note:  Advised pt that she needs to be seen today, Dr. Jonny Ruiz did not have any appts available.  Pt refuses to be seen by another MD, said will wait and see Dr. Jonny Ruiz tomorrow.  Appt scheduled for tomorrow with Dr. Jonny Ruiz, triager advised pt to call office back if she changes her mind and agrees to be seen today by another provider.    Symptoms  Reason For Call & Symptoms: Calling today 07/02/12 regarding she has appt with pain management specialist on Feb 12.  She keeps calling to see if they have any cancellations because her lower back pain is getting worse.  The only thing she is taking for pain is Aleve and that is not helping.  Reviewed Health History In EMR: Yes  Reviewed Medications In EMR: Yes  Reviewed Allergies In EMR: Yes  Reviewed Surgeries / Procedures: Yes  Date of Onset of Symptoms: 03/19/2012  Treatments Tried: Aleve, walking and water aroebics 3 days per week  Treatments Tried Worked: No OB / GYN:  LMP: Unknown  Guideline(s) Used:  Back Pain  Disposition Per Guideline:   See Today in Office  Reason For Disposition Reached:   Pain radiates into the thigh or further down the leg, and in both legs  Advice Given:  N/A  Appointment Scheduled:  07/03/2012 08:00:00 Appointment Scheduled Provider:  Oliver Barre (Adults only)

## 2012-07-03 ENCOUNTER — Encounter: Payer: Self-pay | Admitting: Internal Medicine

## 2012-07-03 ENCOUNTER — Ambulatory Visit (INDEPENDENT_AMBULATORY_CARE_PROVIDER_SITE_OTHER): Payer: Medicare Other | Admitting: Internal Medicine

## 2012-07-03 VITALS — BP 102/82 | HR 80 | Temp 98.7°F | Ht 65.0 in | Wt 158.0 lb

## 2012-07-03 DIAGNOSIS — G8929 Other chronic pain: Secondary | ICD-10-CM

## 2012-07-03 DIAGNOSIS — M545 Low back pain, unspecified: Secondary | ICD-10-CM

## 2012-07-03 DIAGNOSIS — M25469 Effusion, unspecified knee: Secondary | ICD-10-CM

## 2012-07-03 DIAGNOSIS — M25569 Pain in unspecified knee: Secondary | ICD-10-CM

## 2012-07-03 DIAGNOSIS — M25561 Pain in right knee: Secondary | ICD-10-CM

## 2012-07-03 DIAGNOSIS — J449 Chronic obstructive pulmonary disease, unspecified: Secondary | ICD-10-CM

## 2012-07-03 DIAGNOSIS — M25461 Effusion, right knee: Secondary | ICD-10-CM | POA: Insufficient documentation

## 2012-07-03 MED ORDER — CYCLOBENZAPRINE HCL 5 MG PO TABS: 5.0000 mg | ORAL_TABLET | Freq: Three times a day (TID) | ORAL | Status: DC | PRN

## 2012-07-03 MED ORDER — OXYCODONE HCL 15 MG PO TABS: 15.0000 mg | ORAL_TABLET | ORAL | Status: DC | PRN

## 2012-07-03 MED ORDER — KETOROLAC TROMETHAMINE 30 MG/ML IJ SOLN
30.0000 mg | Freq: Once | INTRAMUSCULAR | Status: AC
  Administered 2012-07-03: 30 mg via INTRAMUSCULAR

## 2012-07-03 NOTE — Assessment & Plan Note (Signed)
With acute flare and an element of muscle spasm, overall severe today, for toradoll IM, flexeril prn, incr oxycodone to 15 mg - 5 times per day; with f/u pain clinic as planned, ok to use the last lidoderm patches she has at home as well

## 2012-07-03 NOTE — Assessment & Plan Note (Signed)
New problem for her, with mod effusion and pain today, ? DJD flare vs cartilage tear - for referral back to [facility]

## 2012-07-03 NOTE — Progress Notes (Signed)
Subjective:    Patient ID: [name], female    DOB: June 24, 1955, 56 y.o.   MRN: [id number]  HPI  Here with acute on chronic back pain, was referred to pain clinic, but first appt feb 12 with Dr Oneal Grout in [place], has been seeing Dr Franchot Heidelberg [facility]) with MRI April 2013 with quite a bit of facet dz, s/p ESI x 3 (total 6 injections bilat); pt states "hit a nerve" on the left at one point, with subsequent pain seeming worse to both legs above the knees worse on the right, aching/shooting, severe.  Not eligible for further ESI until feb.  Pt continues to have recurring LBP but no bowel or bladder change, fever, wt loss,  worsening LE pain/numbness/weakness,  or falls. Now on primidone 50 bid for tremors (neuro - [facility])recently with some improvement but still has "bad spells", with f/u feb 20 planned. Has a few lidoderm patches but cannot afford more.  Also with new right knee pain/effusion for 1-2 wks after a mistep possible twistin with persistent pain/swelling and feelling of grinding since then, no giveaways or falls, no prior right knee issue. Left knee without pain. Pt denies chest pain, increased sob or doe, wheezing, orthopnea, PND, increased LE swelling, palpitations, dizziness or syncope.  Pt denies fever, wt loss, night sweats, loss of appetite, or other constitutional symptoms Past Medical History  Diagnosis Date  . Depression     bipolar  . OCD (obsessive compulsive disorder)     anxiety  . Migraines   . Brain aneurysm   . Chronic abdominal pain   . History of colonic polyps   . Peripheral neuropathy   . Fibromyalgia   . HLD (hyperlipidemia)   . GERD (gastroesophageal reflux disease)   . IBS (irritable bowel syndrome)     chronic constipation  . Allergic rhinitis   . Gastroparesis   . COPD (chronic obstructive pulmonary disease)    Past Surgical History  Procedure Date  . Appendectomy   . Cholecystectomy   . Vesicovaginal fistula closure w/ tah     . Oophorectomy   . Coil to brain aneurysm   . Back surgery     disc  . Shoulder surgery   . Elbow surgery     reports that she has been smoking.  She does not have any smokeless tobacco history on file. She reports that she does not drink alcohol or use illicit drugs. family history includes Cancer in her father, mother, sister, and unspecified family member; Coronary artery disease in an unspecified family member; Diabetes in an unspecified family member; Hypertension in an unspecified family member; Leukemia in her sister; and Seizures in an unspecified family member.  There is no history of Colon cancer. Allergies  Allergen Reactions  . Hydrocodone Nausea Only    Can take it if its in a cough syrup   . Nsaids     REACTION: GI irritation  . Propoxyphene-Acetaminophen Itching  . Tramadol     REACTION: nausea  . Varenicline Tartrate     REACTION: agitation, depression, sick to stomach   Current Outpatient Prescriptions on File Prior to Visit  Medication Sig Dispense Refill  . aspirin 81 MG EC tablet Take 81 mg by mouth daily.        Marland Kitchen atorvastatin (LIPITOR) 40 MG tablet Take 40 mg by mouth at bedtime.      . Cholecalciferol (VITAMIN D3) 1000 UNITS CAPS Take 1 capsule by mouth daily.        Marland Kitchen  cyanocobalamin 1000 MCG tablet Take 1,000 mcg by mouth daily.       . DULoxetine (CYMBALTA) 60 MG capsule Take 60 mg by mouth daily.      Marland Kitchen lithium 600 MG capsule Take 600 mg by mouth at bedtime.       Marland Kitchen LORazepam (ATIVAN) 1 MG tablet Take 1 mg by mouth every 8 (eight) hours as needed. For anxiety      . Lurasidone HCl (LATUDA) 120 MG TABS Take 120 mg by mouth at bedtime.      . meclizine (ANTIVERT) 25 MG tablet Take 25 mg by mouth 2 (two) times daily.      Marland Kitchen omeprazole (PRILOSEC) 40 MG capsule Take 40 mg by mouth daily.      . potassium chloride (K-DUR,KLOR-CON) 10 MEQ tablet Take 10 mEq by mouth daily.      . primidone (MYSOLINE) 50 MG tablet Take 50 mg by mouth 2 (two) times daily.      Marland Kitchen  zolpidem (AMBIEN) 10 MG tablet Take 10 mg by mouth at bedtime.       Review of Systems  Constitutional: Negative for unexpected weight change, or unusual diaphoresis  HENT: Negative for tinnitus.   Eyes: Negative for photophobia and visual disturbance.  Respiratory: Negative for choking and stridor.   Gastrointestinal: Negative for vomiting and blood in stool.  Genitourinary: Negative for hematuria and decreased urine volume.  Skin: Negative for color change and wound.  Neurological: Negative for tremors and numbness other than noted  Psychiatric/Behavioral: Negative for decreased concentration or  hyperactivity.      Objective:   Physical Exam BP 102/82  Pulse 80  Temp 98.7 F (37.1 C) (Oral)  Ht 5\' 5"  (1.651 m)  Wt 158 lb (71.668 kg)  BMI 26.29 kg/m2  SpO2 99% VS noted,  Constitutional: Pt appears well-developed and well-nourished.  HENT: Head: NCAT.  Right Ear: External ear normal.  Left Ear: External ear normal.  Eyes: Conjunctivae and EOM are normal. Pupils are equal, round, and reactive to light.  Neck: Normal range of motion. Neck supple.  Cardiovascular: Normal rate and regular rhythm.   Pulmonary/Chest: Effort normal and breath sounds without rales or wheeze.  Abd:  Soft, NT, non-distended, + BS Neurological: Pt is alert. Not confused , motor/dtr intact Right lumbar paravertebral > left tender spasm noted Skin: Skin is warm. No erythema.  Psychiatric: Pt behavior is normal. Thought content normal.  Right knee with decreased ROM, crepitus, anterior tender diffusely, 1-2+ effusion, neg lockmons and drawer tests, ? + grinding test    Assessment & Plan:

## 2012-07-03 NOTE — Patient Instructions (Addendum)
You had the toradol (pain medication, anti-inflammatory) shot today OK to stop the oxycodone 10 mg's Ok to take the oxycodone 15 mg up to 5 times per day Also - Please take all new medication as prescribed - the flexeril as needed (sent to pharmacy) OK to use the leftover lidoderm you have at home You will be contacted regarding the referral for: [facility] for the right knee Please keep your appointments with your specialists as you have planned - Pain clinic Please continue all other medications as before, and refills have been done if requested. Please have the pharmacy call with any other refills you may need. Thank you for enrolling in MyChart. Please follow the instructions below to securely access your online medical record. MyChart allows you to send messages to your doctor, view your test results, renew your prescriptions, schedule appointments, and more. We will need to plan on checking the followup CT at your next appt in march 2014 as you have planned

## 2012-07-04 ENCOUNTER — Encounter: Payer: Self-pay | Admitting: Internal Medicine

## 2012-07-04 NOTE — Assessment & Plan Note (Signed)
stable overall by history and exam, recent data reviewed with pt, and pt to continue medical treatment as before,  to f/u any worsening symptoms or concerns SpO2 Readings from Last 3 Encounters:  07/03/12 99%  05/21/12 96%  04/30/12 99%

## 2012-08-14 LAB — HM MAMMOGRAPHY

## 2012-08-19 ENCOUNTER — Telehealth: Payer: Self-pay

## 2012-08-19 ENCOUNTER — Other Ambulatory Visit (INDEPENDENT_AMBULATORY_CARE_PROVIDER_SITE_OTHER): Payer: Medicare Other

## 2012-08-19 DIAGNOSIS — E785 Hyperlipidemia, unspecified: Secondary | ICD-10-CM

## 2012-08-19 DIAGNOSIS — Z0000 Encounter for general adult medical examination without abnormal findings: Secondary | ICD-10-CM

## 2012-08-19 LAB — HEPATIC FUNCTION PANEL
Bilirubin, Direct: 0.1 mg/dL (ref 0.0–0.3)
Total Bilirubin: 0.6 mg/dL (ref 0.3–1.2)
Total Protein: 6.9 g/dL (ref 6.0–8.3)

## 2012-08-19 LAB — CBC WITH DIFFERENTIAL/PLATELET
Eosinophils Absolute: 0 10*3/uL (ref 0.0–0.7)
HCT: 42 % (ref 36.0–46.0)
Lymphs Abs: 1.2 10*3/uL (ref 0.7–4.0)
MCHC: 33.2 g/dL (ref 30.0–36.0)
MCV: 98.4 fl (ref 78.0–100.0)
Monocytes Absolute: 0.9 10*3/uL (ref 0.1–1.0)
Neutrophils Relative %: 88.8 % — ABNORMAL HIGH (ref 43.0–77.0)
Platelets: 280 10*3/uL (ref 150.0–400.0)

## 2012-08-19 LAB — LIPID PANEL
Cholesterol: 124 mg/dL (ref 0–200)
HDL: 66.2 mg/dL (ref >39.00)
LDL Cholesterol: 49 mg/dL (ref 0–99)
VLDL: 8.6 mg/dL (ref 0.0–40.0)

## 2012-08-19 LAB — URINALYSIS, ROUTINE W REFLEX MICROSCOPIC
Leukocytes, UA: NEGATIVE
Nitrite: NEGATIVE
Specific Gravity, Urine: 1.015 (ref 1.000–1.030)
Urine Glucose: NEGATIVE
Urobilinogen, UA: 0.2 (ref 0.0–1.0)

## 2012-08-19 LAB — BASIC METABOLIC PANEL
BUN: 8 mg/dL (ref 6–23)
Chloride: 102 mEq/L (ref 96–112)
GFR: 68.37 mL/min (ref >60.00)
Glucose, Bld: 111 mg/dL — ABNORMAL HIGH (ref 70–99)
Potassium: 4.1 mEq/L (ref 3.5–5.1)
Sodium: 137 mEq/L (ref 135–145)

## 2012-08-19 NOTE — Telephone Encounter (Signed)
Critical Lab results WBC 20.5

## 2012-08-26 ENCOUNTER — Encounter: Payer: Self-pay | Admitting: Internal Medicine

## 2012-08-26 ENCOUNTER — Ambulatory Visit (INDEPENDENT_AMBULATORY_CARE_PROVIDER_SITE_OTHER): Payer: Medicare Other | Admitting: Internal Medicine

## 2012-08-26 ENCOUNTER — Other Ambulatory Visit: Payer: Self-pay | Admitting: Internal Medicine

## 2012-08-26 ENCOUNTER — Other Ambulatory Visit (INDEPENDENT_AMBULATORY_CARE_PROVIDER_SITE_OTHER): Payer: Medicare Other

## 2012-08-26 VITALS — BP 100/60 | HR 83 | Temp 97.7°F | Ht 65.0 in | Wt 156.5 lb

## 2012-08-26 DIAGNOSIS — Z0000 Encounter for general adult medical examination without abnormal findings: Secondary | ICD-10-CM

## 2012-08-26 DIAGNOSIS — D72829 Elevated white blood cell count, unspecified: Secondary | ICD-10-CM

## 2012-08-26 LAB — CBC WITH DIFFERENTIAL/PLATELET
Basophils Absolute: 0.1 10*3/uL (ref 0.0–0.1)
Basophils Relative: 0.4 % (ref 0.0–3.0)
Eosinophils Absolute: 0.3 10*3/uL (ref 0.0–0.7)
Lymphocytes Relative: 12.8 % (ref 12.0–46.0)
MCHC: 33.6 g/dL (ref 30.0–36.0)
MCV: 97.3 fl (ref 78.0–100.0)
Monocytes Absolute: 1.1 10*3/uL — ABNORMAL HIGH (ref 0.1–1.0)
Neutrophils Relative %: 77.8 % — ABNORMAL HIGH (ref 43.0–77.0)
Platelets: 281 10*3/uL (ref 150.0–400.0)
RBC: 4.32 Mil/uL (ref 3.87–5.11)

## 2012-08-26 NOTE — Assessment & Plan Note (Signed)

## 2012-08-26 NOTE — Progress Notes (Signed)
Subjective:    Patient ID: [name], female    DOB: Mar 10, 1956, 57 y.o.   MRN: [id number]  HPI  Had shot for a "block" per pt to back mar4 but she thinks last steroid shot was oct 2013.   Pt denies fever, wt loss, night sweats, loss of appetite, or other constitutional symptoms  Gets yearly colonoscopy due to mult large flat polyps per GI - Dr Rhetta Mura.  Pt continues to have recurring LBP without change in severity, bowel or bladder change, fever, wt loss,  worsening LE pain/numbness/weakness, gait change or falls, now with TENS unit per pain management with good overall pain control.  Still smoking, not ready to quit. Sister died at 65yo with some form of leukemia. Mother has malignancy to brain, and another sister died with copd/pna, and another with "stomach" cancer. Past Medical History  Diagnosis Date  . Depression     bipolar  . OCD (obsessive compulsive disorder)     anxiety  . Migraines   . Brain aneurysm   . Chronic abdominal pain   . History of colonic polyps   . Peripheral neuropathy   . Fibromyalgia   . HLD (hyperlipidemia)   . GERD (gastroesophageal reflux disease)   . IBS (irritable bowel syndrome)     chronic constipation  . Allergic rhinitis   . Gastroparesis   . COPD (chronic obstructive pulmonary disease)    Past Surgical History  Procedure Laterality Date  . Appendectomy    . Cholecystectomy    . Vesicovaginal fistula closure w/ tah    . Oophorectomy    . Coil to brain aneurysm    . Back surgery      disc  . Shoulder surgery    . Elbow surgery      reports that she has been smoking.  She does not have any smokeless tobacco history on file. She reports that she does not drink alcohol or use illicit drugs. family history includes Cancer in her father, mother, sister, and unspecified family member; Coronary artery disease in an unspecified family member; Diabetes in an unspecified family member; Hypertension in an unspecified family member; Leukemia in her  sister; and Seizures in an unspecified family member.  There is no history of Colon cancer. Allergies  Allergen Reactions  . Hydrocodone Nausea Only    Can take it if its in a cough syrup   . Nsaids     REACTION: GI irritation  . Propoxyphene-Acetaminophen Itching  . Tramadol     REACTION: nausea  . Varenicline Tartrate     REACTION: agitation, depression, sick to stomach   Review of Systems Constitutional: Negative for diaphoresis, activity change, appetite change or unexpected weight change.  HENT: Negative for hearing loss, ear pain, facial swelling, mouth sores and neck stiffness.   Eyes: Negative for pain, redness and visual disturbance.  Respiratory: Negative for shortness of breath and wheezing.   Cardiovascular: Negative for chest pain and palpitations.  Gastrointestinal: Negative for diarrhea, blood in stool, abdominal distention or other pain Genitourinary: Negative for hematuria, flank pain or change in urine volume.  Musculoskeletal: Negative for myalgias and joint swelling.  Skin: Negative for color change and wound.  Neurological: Negative for syncope and numbness. other than noted Hematological: Negative for adenopathy.  Psychiatric/Behavioral: Negative for hallucinations, self-injury, decreased concentration and agitation.      Objective:   Physical Exam BP 100/60  Pulse 83  Temp(Src) 97.7 F (36.5 C) (Oral)  Ht 5\' 5"  (1.651 m)  Wt 156 lb 8 oz (70.988 kg)  BMI 26.04 kg/m2  SpO2 96% VS noted,  Constitutional: Pt is oriented to person, place, and time. Appears well-developed and well-nourished.  Head: Normocephalic and atraumatic.  Right Ear: External ear normal.  Left Ear: External ear normal.  Nose: Nose normal.  Mouth/Throat: Oropharynx is clear and moist.  Eyes: Conjunctivae and EOM are normal. Pupils are equal, round, and reactive to light.  Neck: Normal range of motion. Neck supple. No JVD present. No tracheal deviation present.  Cardiovascular:  Normal rate, regular rhythm, normal heart sounds and intact distal pulses.   Pulmonary/Chest: Effort normal and breath sounds normal.  Abdominal: Soft. Bowel sounds are normal. There is no tenderness. No HSM  Musculoskeletal: Normal range of motion. Exhibits no edema.  Lymphadenopathy:  Has no cervical adenopathy.  Neurological: Pt is alert and oriented to person, place, and time. Pt has normal reflexes. No cranial nerve deficit.  Skin: Skin is warm and dry. No rash noted.  Psychiatric:  Has  normal mood and affect. Behavior is normal.     Assessment & Plan:

## 2012-08-26 NOTE — Assessment & Plan Note (Signed)
Unclear etiology, for f/u lab today

## 2012-08-26 NOTE — Patient Instructions (Addendum)
Please continue all other medications as before, and refills have been done if requested. Please have the pharmacy call with any other refills you may need. Please continue your efforts at being more active, low cholesterol diet, and weight control. You are otherwise up to date with prevention measures today. Please keep your appointments with your specialists as you have planned - the yearly colonoscopy Please go to the LAB in the Basement (turn left off the elevator) for the tests to be done today - just the CBC today If the WBC is still elevated, you may need to be referred to Hematology Please remember to followup with your GYN for the yearly pap smear and/or mammogram, as you do You can download the Mychart App from the [facility] on your android phone, with the same username and password. Thank you for enrolling in MyChart. Please follow the instructions below to securely access your online medical record. MyChart allows you to send messages to your doctor, view your test results, renew your prescriptions, schedule appointments, and more Please return in 6 months, or sooner if needed

## 2012-09-09 ENCOUNTER — Telehealth: Payer: Self-pay | Admitting: Internal Medicine

## 2012-09-09 NOTE — Telephone Encounter (Signed)
Patient Information:  Caller Name: Khiana  Phone: [phone]  Patient: [name], [name]  Gender: Female  DOB: 09/10/1955  Age: 57 Years  PCP: Oliver Barre (Adults only)  Pregnant: No  Office Follow Up:  Does the office need to follow up with this patient?: No  Instructions For The Office: N/A  RN Note:  Called regarding worsening fatigue.  Noted increased thirst and > urination but no > in appetite. Not going to gym for past 2-3 weeks. Takes medication that could cause fatigue.  Unable to get to office 09/09/12; requested appointment for 09/11/12. Instructed to call back for appointment for 09/11/12  Symptoms  Reason For Call & Symptoms: Called regarding worsening fatigue; "just feels drained."  Office visit 08/26/12.  Reports MD following elevated WBC; scheduled for repeat lab June 2014. Reports  Respiradol dose increased to 3 mg; will increase to 4mg  09/11/12, and Neurontin dose is actually 900 mg hs.  Reviewed Health History In EMR: Yes  Reviewed Medications In EMR: Yes  Reviewed Allergies In EMR: Yes  Reviewed Surgeries / Procedures: Yes  Date of Onset of Symptoms: 07/12/2012 OB / GYN:  LMP: Unknown  Guideline(s) Used:  Weakness (Generalized) and Fatigue  Disposition Per Guideline:   See Today in Office  Reason For Disposition Reached:   Taking a medicine that could cause weakness (e.g., blood pressure medications, diuretics)  Advice Given:  Reassurance  Weakness often accompanies viral illnesses (e.g., colds and flu).  Call Back If:  Unable to stand or walk  Passes out  Breathing difficulty occurs  You become worse.  RN Overrode Recommendation:  Make Appointment  Requests appointment for 09/11/12 due to transportation issue.

## 2012-09-09 NOTE — Telephone Encounter (Signed)
Ok with me or regina mar 28

## 2012-09-10 ENCOUNTER — Ambulatory Visit: Payer: Medicare Other | Admitting: Internal Medicine

## 2012-09-11 NOTE — Telephone Encounter (Signed)
Pt declined appt on March 26, was scheduled on March 26, but canceled because she didn't have a ride.  She said she will call back when she has a ride.

## 2012-10-12 ENCOUNTER — Other Ambulatory Visit: Payer: Self-pay | Admitting: Internal Medicine

## 2012-11-16 ENCOUNTER — Other Ambulatory Visit: Payer: Self-pay | Admitting: Internal Medicine

## 2012-11-24 ENCOUNTER — Telehealth: Payer: Self-pay | Admitting: *Deleted

## 2012-11-24 NOTE — Telephone Encounter (Signed)
Left msg on triage wanting to know when she need to come in to have labs. Called pt inform her md has already put order in system can come at anytime...lmb

## 2012-11-25 ENCOUNTER — Other Ambulatory Visit (INDEPENDENT_AMBULATORY_CARE_PROVIDER_SITE_OTHER): Payer: Medicare Other

## 2012-11-25 DIAGNOSIS — D72829 Elevated white blood cell count, unspecified: Secondary | ICD-10-CM

## 2012-11-25 LAB — CBC WITH DIFFERENTIAL/PLATELET
Basophils Relative: 0.3 % (ref 0.0–3.0)
Eosinophils Absolute: 0.3 10*3/uL (ref 0.0–0.7)
Eosinophils Relative: 3.4 % (ref 0.0–5.0)
Hemoglobin: 13.3 g/dL (ref 12.0–15.0)
Lymphocytes Relative: 22.1 % (ref 12.0–46.0)
Monocytes Relative: 10 % (ref 3.0–12.0)
Neutro Abs: 6.1 10*3/uL (ref 1.4–7.7)
Neutrophils Relative %: 64.2 % (ref 43.0–77.0)
RBC: 3.97 Mil/uL (ref 3.87–5.11)
WBC: 9.5 10*3/uL (ref 4.5–10.5)

## 2012-11-30 ENCOUNTER — Encounter: Payer: Self-pay | Admitting: Internal Medicine

## 2013-01-29 ENCOUNTER — Encounter: Payer: Self-pay | Admitting: Internal Medicine

## 2013-01-29 ENCOUNTER — Ambulatory Visit (INDEPENDENT_AMBULATORY_CARE_PROVIDER_SITE_OTHER): Payer: Medicare Other | Admitting: Internal Medicine

## 2013-01-29 ENCOUNTER — Other Ambulatory Visit (INDEPENDENT_AMBULATORY_CARE_PROVIDER_SITE_OTHER): Payer: Medicare Other

## 2013-01-29 VITALS — BP 86/62 | HR 90 | Temp 97.8°F | Ht 65.0 in | Wt 155.8 lb

## 2013-01-29 DIAGNOSIS — J449 Chronic obstructive pulmonary disease, unspecified: Secondary | ICD-10-CM

## 2013-01-29 DIAGNOSIS — I951 Orthostatic hypotension: Secondary | ICD-10-CM

## 2013-01-29 DIAGNOSIS — R42 Dizziness and giddiness: Secondary | ICD-10-CM

## 2013-01-29 DIAGNOSIS — R110 Nausea: Secondary | ICD-10-CM

## 2013-01-29 LAB — BASIC METABOLIC PANEL
BUN: 6 mg/dL (ref 6–23)
CO2: 33 mEq/L — ABNORMAL HIGH (ref 19–32)
Chloride: 99 mEq/L (ref 96–112)
Creatinine, Ser: 0.9 mg/dL (ref 0.4–1.2)
Glucose, Bld: 73 mg/dL (ref 70–99)
Potassium: 4 mEq/L (ref 3.5–5.1)

## 2013-01-29 LAB — CBC WITH DIFFERENTIAL/PLATELET
Eosinophils Absolute: 0.3 10*3/uL (ref 0.0–0.7)
Eosinophils Relative: 3 % (ref 0.0–5.0)
Lymphocytes Relative: 20.7 % (ref 12.0–46.0)
MCHC: 34.3 g/dL (ref 30.0–36.0)
MCV: 96.9 fl (ref 78.0–100.0)
Monocytes Absolute: 0.8 10*3/uL (ref 0.1–1.0)
Neutrophils Relative %: 66.7 % (ref 43.0–77.0)
Platelets: 196 10*3/uL (ref 150.0–400.0)
WBC: 8.4 10*3/uL (ref 4.5–10.5)

## 2013-01-29 LAB — HEPATIC FUNCTION PANEL
ALT: 14 U/L (ref 0–35)
AST: 12 U/L (ref 0–37)
Total Bilirubin: 0.3 mg/dL (ref 0.3–1.2)
Total Protein: 6 g/dL (ref 6.0–8.3)

## 2013-01-29 LAB — URINALYSIS, ROUTINE W REFLEX MICROSCOPIC
Nitrite: NEGATIVE
Specific Gravity, Urine: <=1.005 (ref 1.000–1.030)
Urobilinogen, UA: 0.2 (ref 0.0–1.0)

## 2013-01-29 MED ORDER — DEXLANSOPRAZOLE 60 MG PO CPDR: 60.0000 mg | DELAYED_RELEASE_CAPSULE | Freq: Every day | ORAL | Status: DC

## 2013-01-29 NOTE — Assessment & Plan Note (Addendum)
ECG reviewed as per emr, most likely volume related to decreased po intake overall in the past mo, pt adamantly refuses suggestion of ER now for IVF's, has someone to drive her home, no recent falls and competant to make decisions; for increased po intake with small persistent sips of fluids at all times until improved

## 2013-01-29 NOTE — Progress Notes (Signed)
Subjective:    Patient ID: [name], female    DOB: 04-05-1956, 57 y.o.   MRN: [id number]  HPI  Here to f/u with dizziness/lightheaded x 4-5 days signficant to the point of fatigue and wanting to lie down, assoc with worsening nausea and decreased po intake, after having some milder nausea for approx 1 mo, has been seeing GI, tx with zofran prn, Has had no vomiting, fever, worsening abd pain, blood, back pain, or change in bowel habits.  Denies constipation.  Denies urinary symptoms such as dysuria, frequency, urgency, flank pain, hematuria or n/v, fever, chills. Pt denies chest pain, increased sob or doe, wheezing, orthopnea, PND, increased LE swelling, palpitations, dizziness or syncope.   Pt denies polydipsia, polyuria, .     Past Medical History  Diagnosis Date  . Depression     bipolar  . OCD (obsessive compulsive disorder)     anxiety  . Migraines   . Brain aneurysm   . Chronic abdominal pain   . History of colonic polyps   . Peripheral neuropathy   . Fibromyalgia   . HLD (hyperlipidemia)   . GERD (gastroesophageal reflux disease)   . IBS (irritable bowel syndrome)     chronic constipation  . Allergic rhinitis   . Gastroparesis   . COPD (chronic obstructive pulmonary disease)    Past Surgical History  Procedure Laterality Date  . Appendectomy    . Cholecystectomy    . Vesicovaginal fistula closure w/ tah    . Oophorectomy    . Coil to brain aneurysm    . Back surgery      disc  . Shoulder surgery    . Elbow surgery      reports that she has been smoking.  She does not have any smokeless tobacco history on file. She reports that she does not drink alcohol or use illicit drugs. family history includes Cancer in her father, mother, sister, and another family member; Coronary artery disease in an other family member; Diabetes in an other family member; Hypertension in an other family member; Leukemia in her sister; Seizures in an other family member. There is no history  of Colon cancer. Allergies  Allergen Reactions  . Hydrocodone Nausea Only    Can take it if its in a cough syrup   . Nsaids     REACTION: GI irritation  . Propoxyphene-Acetaminophen Itching  . Tramadol     REACTION: nausea  . Varenicline Tartrate     REACTION: agitation, depression, sick to stomach   Current Outpatient Prescriptions on File Prior to Visit  Medication Sig Dispense Refill  . aspirin 81 MG EC tablet Take 81 mg by mouth daily.        Marland Kitchen atorvastatin (LIPITOR) 40 MG tablet TAKE ONE TABLET BY MOUTH EVERY DAY  30 tablet  11  . Cholecalciferol (VITAMIN D3) 1000 UNITS CAPS Take 1 capsule by mouth daily.        . cyanocobalamin 1000 MCG tablet Take 1,000 mcg by mouth daily.       . DULoxetine (CYMBALTA) 60 MG capsule Take 60 mg by mouth daily.      Marland Kitchen gabapentin (NEURONTIN) 300 MG capsule Take 300 mg by mouth at bedtime.      Marland Kitchen lithium 600 MG capsule Take 600 mg by mouth at bedtime.       . Lurasidone HCl (LATUDA) 120 MG TABS Take 120 mg by mouth at bedtime.      Marland Kitchen omeprazole (PRILOSEC)  40 MG capsule Take 40 mg by mouth daily.      . Oxycodone HCl 10 MG TABS Take 1 to 3 po qd prn      . potassium chloride (K-DUR,KLOR-CON) 10 MEQ tablet TAKE ONE TABLET BY MOUTH EVERY DAY  30 tablet  11  . primidone (MYSOLINE) 50 MG tablet Take 50 mg by mouth 2 (two) times daily.      . risperiDONE (RISPERDAL) 2 MG tablet Take 2 mg by mouth at bedtime.      Marland Kitchen zolpidem (AMBIEN) 10 MG tablet Take 10 mg by mouth at bedtime.       No current facility-administered medications on file prior to visit.   Review of Systems  Constitutional: Negative for unexpected weight change, or unusual diaphoresis  HENT: Negative for tinnitus.   Eyes: Negative for photophobia and visual disturbance.  Respiratory: Negative for choking and stridor.   Gastrointestinal: Negative for vomiting and blood in stool.  Genitourinary: Negative for hematuria and decreased urine volume.  Musculoskeletal: Negative for acute  joint swelling Skin: Negative for color change and wound.  Neurological: Negative for tremors and numbness other than noted  Psychiatric/Behavioral: Negative for decreased concentration or  hyperactivity.       Objective:   Physical Exam BP 86/62  Pulse 90  Temp(Src) 97.8 F (36.6 C) (Oral)  Ht 5\' 5"  (1.651 m)  Wt 155 lb 12 oz (70.648 kg)  BMI 25.92 kg/m2  SpO2 95% VS noted, fatigue, dizzy on sitting up, o/w afeb, not ill appaering Constitutional: Pt appears well-developed and well-nourished.  HENT: Head: NCAT.  Right Ear: External ear normal.  Left Ear: External ear normal.  Eyes: Conjunctivae and EOM are normal. Pupils are equal, round, and reactive to light.  Neck: Normal range of motion. Neck supple.  Cardiovascular: Normal rate and regular rhythm.   Pulmonary/Chest: Effort normal and breath sounds without rales or wheezing.  Abd:  Soft, NT, non-distended, + BS Neurological: Pt is alert. Not confused  Skin: Skin is warm. No erythema.  Psychiatric: Pt behavior is normal. Thought content normal.     Assessment & Plan:

## 2013-01-29 NOTE — Patient Instructions (Signed)
Please drink plenty of fluids if possible over the next few days Please take the sample of Dexilant 60 mg per day for 5 days, then go back to your usual prilosec to see if this helps Please continue all other medications as before, including the zofran Please go to the LAB in the Basement (turn left off the elevator) for the tests to be done today You will be contacted by phone if any changes need to be made immediately.  Otherwise, you will receive a letter about your results with an explanation, but please check with MyChart first.  Please keep your appointments with your specialists as you have planned - GI on Sept 11  Please remember to sign up for My Chart if you have not done so, as this will be important to you in the future with finding out test results, communicating by private email, and scheduling acute appointments online when needed.

## 2013-01-31 DIAGNOSIS — R110 Nausea: Secondary | ICD-10-CM | POA: Insufficient documentation

## 2013-01-31 NOTE — Assessment & Plan Note (Signed)
stable overall by history and exam, recent data reviewed with pt, and pt to continue medical treatment as before,  to f/u any worsening symptoms or concerns SpO2 Readings from Last 3 Encounters:  01/29/13 95%  08/26/12 96%  07/03/12 99%

## 2013-01-31 NOTE — Assessment & Plan Note (Signed)
Etiology unclear, to cont zofran which helps, exam o/w benign, for trial change PPI to 5 days dexilant to seen if helps at all, for labs today, and to f/u with GI as planned

## 2013-04-05 ENCOUNTER — Other Ambulatory Visit (HOSPITAL_COMMUNITY): Payer: Self-pay | Admitting: Interventional Radiology

## 2013-04-05 DIAGNOSIS — I729 Aneurysm of unspecified site: Secondary | ICD-10-CM

## 2013-04-05 DIAGNOSIS — R519 Headache, unspecified: Secondary | ICD-10-CM

## 2013-04-06 ENCOUNTER — Other Ambulatory Visit: Payer: Self-pay | Admitting: Radiology

## 2013-04-06 ENCOUNTER — Ambulatory Visit (HOSPITAL_COMMUNITY): Admission: RE | Admit: 2013-04-06 | Payer: Medicare Other | Source: Ambulatory Visit

## 2013-04-08 ENCOUNTER — Encounter (HOSPITAL_COMMUNITY): Payer: Self-pay | Admitting: Pharmacy Technician

## 2013-04-09 ENCOUNTER — Ambulatory Visit (HOSPITAL_COMMUNITY)
Admission: RE | Admit: 2013-04-09 | Discharge: 2013-04-09 | Disposition: A | Discharge status: Home / Self Care | Payer: Medicare Other | Source: Ambulatory Visit | Attending: Interventional Radiology | Admitting: Interventional Radiology

## 2013-04-09 ENCOUNTER — Other Ambulatory Visit (HOSPITAL_COMMUNITY): Payer: Self-pay | Admitting: Interventional Radiology

## 2013-04-09 ENCOUNTER — Encounter (HOSPITAL_COMMUNITY): Payer: Self-pay

## 2013-04-09 DIAGNOSIS — I729 Aneurysm of unspecified site: Secondary | ICD-10-CM

## 2013-04-09 DIAGNOSIS — R519 Headache, unspecified: Secondary | ICD-10-CM

## 2013-04-09 DIAGNOSIS — Z09 Encounter for follow-up examination after completed treatment for conditions other than malignant neoplasm: Secondary | ICD-10-CM | POA: Insufficient documentation

## 2013-04-09 DIAGNOSIS — Z9889 Other specified postprocedural states: Secondary | ICD-10-CM | POA: Insufficient documentation

## 2013-04-09 DIAGNOSIS — F17200 Nicotine dependence, unspecified, uncomplicated: Secondary | ICD-10-CM | POA: Insufficient documentation

## 2013-04-09 DIAGNOSIS — I671 Cerebral aneurysm, nonruptured: Secondary | ICD-10-CM | POA: Insufficient documentation

## 2013-04-09 DIAGNOSIS — Z79899 Other long term (current) drug therapy: Secondary | ICD-10-CM | POA: Insufficient documentation

## 2013-04-09 DIAGNOSIS — J449 Chronic obstructive pulmonary disease, unspecified: Secondary | ICD-10-CM | POA: Insufficient documentation

## 2013-04-09 DIAGNOSIS — J4489 Other specified chronic obstructive pulmonary disease: Secondary | ICD-10-CM | POA: Insufficient documentation

## 2013-04-09 LAB — CBC WITH DIFFERENTIAL/PLATELET
Basophils Absolute: 0 10*3/uL (ref 0.0–0.1)
Basophils Relative: 0 % (ref 0–1)
Eosinophils Absolute: 0.4 10*3/uL (ref 0.0–0.7)
HCT: 46.2 % — ABNORMAL HIGH (ref 36.0–46.0)
MCH: 33.4 pg (ref 26.0–34.0)
MCHC: 33.1 g/dL (ref 30.0–36.0)
Neutrophils Relative %: 69 % (ref 43–77)
Platelets: 223 10*3/uL (ref 150–400)
RBC: 4.58 MIL/uL (ref 3.87–5.11)
RDW: 13.7 % (ref 11.5–15.5)

## 2013-04-09 LAB — BASIC METABOLIC PANEL
BUN: 3 mg/dL — ABNORMAL LOW (ref 6–23)
CO2: 30 mEq/L (ref 19–32)
Calcium: 9.1 mg/dL (ref 8.4–10.5)
Chloride: 106 mEq/L (ref 96–112)
GFR calc non Af Amer: 64 mL/min — ABNORMAL LOW (ref >90)
Glucose, Bld: 101 mg/dL — ABNORMAL HIGH (ref 70–99)
Sodium: 143 mEq/L (ref 135–145)

## 2013-04-09 LAB — PROTIME-INR
INR: 0.94 (ref 0.00–1.49)
Prothrombin Time: 12.4 seconds (ref 11.6–15.2)

## 2013-04-09 MED ORDER — MIDAZOLAM HCL 2 MG/2ML IJ SOLN
INTRAMUSCULAR | Status: AC | PRN
  Administered 2013-04-09: 0.5 mg via INTRAVENOUS

## 2013-04-09 MED ORDER — SODIUM CHLORIDE 0.9 % IV SOLN: INTRAVENOUS | Status: DC

## 2013-04-09 MED ORDER — SODIUM CHLORIDE 0.9 % IV SOLN: INTRAVENOUS | Status: AC

## 2013-04-09 MED ORDER — MIDAZOLAM HCL 2 MG/2ML IJ SOLN
INTRAMUSCULAR | Status: AC
  Filled 2013-04-09: qty 4

## 2013-04-09 MED ORDER — FENTANYL CITRATE 0.05 MG/ML IJ SOLN
INTRAMUSCULAR | Status: AC
  Filled 2013-04-09: qty 2

## 2013-04-09 MED ORDER — HEPARIN SOD (PORK) LOCK FLUSH 100 UNIT/ML IV SOLN
INTRAVENOUS | Status: AC | PRN
  Administered 2013-04-09: 1000 [IU] via INTRAVENOUS

## 2013-04-09 MED ORDER — IOHEXOL 300 MG/ML  SOLN
150.0000 mL | Freq: Once | INTRAMUSCULAR | Status: AC | PRN
  Administered 2013-04-09: 70 mL via INTRA_ARTERIAL

## 2013-04-09 MED ORDER — FENTANYL CITRATE 0.05 MG/ML IJ SOLN
INTRAMUSCULAR | Status: AC | PRN
  Administered 2013-04-09: 12.5 ug via INTRAVENOUS

## 2013-04-09 NOTE — Progress Notes (Signed)
Discharge instruction given per MD order.  Pt and Cg able to verbalize understanding.  Pt denies any discomfort at this time. Pt to car via wheelchair.

## 2013-04-09 NOTE — Procedures (Signed)
S/P 4 vessel cerebral arteriogram. RT CFA approach. Findings. 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 2.2.6 mmx 2.36mm Lt PCom aneurysm 3.Previous RT MCA aneurysms nearly completely obliterated

## 2013-04-09 NOTE — H&P (Signed)
Chief Complaint: "I'm here for an angiogram" HPI: [name] is an 57 y.o. 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 She is scheduled today for follow up cerebral arteriogram. PMHx and meds reviewed, pt still smoking. No recent fevers, chills, illness.  Past Medical History:  Past Medical History  Diagnosis Date  . Depression     bipolar  . OCD (obsessive compulsive disorder)     anxiety  . Migraines   . Brain aneurysm   . Chronic abdominal pain   . History of colonic polyps   . Peripheral neuropathy   . Fibromyalgia   . HLD (hyperlipidemia)   . GERD (gastroesophageal reflux disease)   . IBS (irritable bowel syndrome)     chronic constipation  . Allergic rhinitis   . Gastroparesis   . COPD (chronic obstructive pulmonary disease)     Past Surgical History:  Past Surgical History  Procedure Laterality Date  . Appendectomy    . Cholecystectomy    . Vesicovaginal fistula closure w/ tah    . Oophorectomy    . Coil to brain aneurysm    . Back surgery      disc  . Shoulder surgery    . Elbow surgery      Family History:  Family History  Problem Relation Age of Onset  . Coronary artery disease      family hx  . Cancer      family hx  . Diabetes      DM - family hx  . Hypertension      family hx  . Seizures      family hx  . Colon cancer Neg Hx   . Cancer Father     lung  . Cancer Sister     stomach  . Cancer Mother     brain  . Leukemia Sister     Social History:  reports that she has been smoking.  She does not have any smokeless tobacco history on file. She reports that she does not drink alcohol or use illicit drugs.  Allergies:  Allergies  Allergen Reactions  . Hydrocodone Nausea Only    Can take it if its in a cough syrup   . Nsaids    REACTION: GI irritation  . Propoxyphene-Acetaminophen Itching  . Tramadol     REACTION: nausea  . Varenicline Tartrate     REACTION: agitation, depression, sick to stomach    Medications:   Medication List    ASK your doctor about these medications       aspirin 81 MG EC tablet  Take 81 mg by mouth daily.     atorvastatin 40 MG tablet  Commonly known as:  LIPITOR  Take 40 mg by mouth daily.     cyanocobalamin 1000 MCG tablet  Take 1,000 mcg by mouth daily.     DULoxetine 60 MG capsule  Commonly known as:  CYMBALTA  Take 60 mg by mouth daily.     gabapentin 300 MG capsule  Commonly known as:  NEURONTIN  Take 900 mg by mouth at bedtime.     LATUDA 120 MG Tabs  Generic drug:  Lurasidone HCl  Take 120 mg by mouth at bedtime.     lithium 600 MG capsule  Take 600-1,200 mg by mouth 2 (two) times daily. Take  1 capsule in the morning and 2 capsules at bedtime     omeprazole 40 MG capsule  Commonly known as:  PRILOSEC  Take 40 mg by mouth daily.     oxyCODONE 15 MG immediate release tablet  Commonly known as:  ROXICODONE  Take 15 mg by mouth 4 (four) times daily as needed for pain.     potassium chloride 10 MEQ tablet  Commonly known as:  K-DUR,KLOR-CON  Take 10 mEq by mouth daily.     primidone 50 MG tablet  Commonly known as:  MYSOLINE  Take 50-100 mg by mouth 2 (two) times daily. Take 1 tablet in morning and take 2 tablets at bedtime     risperiDONE 2 MG tablet  Commonly known as:  RISPERDAL  Take 4 mg by mouth at bedtime.     Vitamin D3 1000 UNITS Caps  Take 1,000 Units by mouth daily.        Please HPI for pertinent positives, otherwise complete 10 system ROS negative.  Physical Exam: BP 103/65  Pulse 77  Temp(Src) 97.7 F (36.5 C) (Oral)  Resp 18  Ht 5\' 5"  (1.651 m)  Wt 155 lb (70.308 kg)  BMI 25.79 kg/m2  SpO2 97% Body mass index is 25.79 kg/(m^2).   General Appearance:  Alert, cooperative, no distress, appears stated age  Head:   Normocephalic, without obvious abnormality, atraumatic  ENT: Unremarkable  Neck: Supple, symmetrical, trachea midline  Lungs:   Clear to auscultation bilaterally, no w/r/r.  Chest Wall:  No tenderness or deformity  Heart:  Regular rate and rhythm, S1, S2 normal, no murmur, rub or gallop.  Extremities: Extremities normal, atraumatic, no cyanosis or edema  Pulses: 2+ and symmetric  Neurologic: Normal affect, no gross deficits.   Results for orders placed during the hospital encounter of 04/09/13 (from the past 48 hour(s))  APTT     Status: None   Collection Time    04/09/13  7:11 AM      Result Value Range   aPTT 28  24 - 37 seconds  BASIC METABOLIC PANEL     Status: Abnormal   Collection Time    04/09/13  7:11 AM      Result Value Range   Sodium 143  135 - 145 mEq/L   Potassium 4.0  3.5 - 5.1 mEq/L   Chloride 106  96 - 112 mEq/L   CO2 30  19 - 32 mEq/L   Glucose, Bld 101 (*) 70 - 99 mg/dL   BUN 3 (*) 6 - 23 mg/dL   Creatinine, Ser 0.98  0.50 - 1.10 mg/dL   Calcium 9.1  8.4 - 11.9 mg/dL   GFR calc non Af Amer 64 (*) >90 mL/min   GFR calc Af Amer 74 (*) >90 mL/min   Comment: (NOTE)     The eGFR has been calculated using the CKD EPI equation.     This calculation has not been validated in all clinical situations.     eGFR's persistently <90 mL/min signify possible Chronic Kidney     Disease.  CBC WITH DIFFERENTIAL     Status: Abnormal   Collection Time    04/09/13  7:11 AM      Result Value Range   WBC 8.2  4.0 - 10.5 K/uL   RBC 4.58  3.87 - 5.11 MIL/uL   Hemoglobin 15.3 (*) 12.0 - 15.0 g/dL   HCT 14.7 (*) 82.9 - 56.2 %   MCV 100.9 (*) 78.0 - 100.0  fL   MCH 33.4  26.0 - 34.0 pg   MCHC 33.1  30.0 - 36.0 g/dL   RDW 16.1  09.6 - 04.5 %   Platelets 223  150 - 400 K/uL   Neutrophils Relative % 69  43 - 77 %   Neutro Abs 5.7  1.7 - 7.7 K/uL   Lymphocytes Relative 17  12 - 46 %   Lymphs Abs 1.4  0.7 - 4.0 K/uL   Monocytes Relative 8  3 - 12 %   Monocytes Absolute 0.7  0.1 -  1.0 K/uL   Eosinophils Relative 5  0 - 5 %   Eosinophils Absolute 0.4  0.0 - 0.7 K/uL   Basophils Relative 0  0 - 1 %   Basophils Absolute 0.0  0.0 - 0.1 K/uL  PROTIME-INR     Status: None   Collection Time    04/09/13  7:11 AM      Result Value Range   Prothrombin Time 12.4  11.6 - 15.2 seconds   INR 0.94  0.00 - 1.49   No results found.  Assessment/Plan Hx of multiple intracranial aneurysms Discussed cerebral arteriogram with no intended intervention today. Explained procedure, risks, complications, use of sedation. Labs reviewed, ok Consent signed in chart  Brayton El PA-C 04/09/2013, 8:16 AM

## 2013-04-11 ENCOUNTER — Telehealth (HOSPITAL_COMMUNITY): Payer: Self-pay | Admitting: Radiology

## 2013-04-13 ENCOUNTER — Other Ambulatory Visit (HOSPITAL_COMMUNITY): Payer: Self-pay | Admitting: Interventional Radiology

## 2013-04-13 DIAGNOSIS — I729 Aneurysm of unspecified site: Secondary | ICD-10-CM

## 2013-04-16 ENCOUNTER — Ambulatory Visit (HOSPITAL_COMMUNITY)
Admission: RE | Admit: 2013-04-16 | Discharge: 2013-04-16 | Disposition: A | Discharge status: Home / Self Care | Payer: Medicare Other | Source: Ambulatory Visit | Attending: Interventional Radiology | Admitting: Interventional Radiology

## 2013-04-16 DIAGNOSIS — I729 Aneurysm of unspecified site: Secondary | ICD-10-CM

## 2013-04-21 ENCOUNTER — Other Ambulatory Visit (HOSPITAL_COMMUNITY): Payer: Self-pay | Admitting: Interventional Radiology

## 2013-04-21 DIAGNOSIS — I729 Aneurysm of unspecified site: Secondary | ICD-10-CM

## 2013-04-22 ENCOUNTER — Other Ambulatory Visit: Payer: Self-pay

## 2013-04-26 ENCOUNTER — Other Ambulatory Visit: Payer: Self-pay | Admitting: Radiology

## 2013-04-26 ENCOUNTER — Encounter (HOSPITAL_COMMUNITY): Payer: Self-pay

## 2013-04-29 ENCOUNTER — Other Ambulatory Visit: Payer: Self-pay | Admitting: Radiology

## 2013-05-01 NOTE — Pre-Procedure Instructions (Addendum)
FREJA FARO  05/01/2013   Your procedure is scheduled on:  November 19  Report to [facility] Entrance "A" [address] at [employer] AM.  Call this number if you have problems the morning of surgery: [phone]   Remember:   Do not eat food or drink liquids after midnight.   Take these medicines the morning of surgery with A SIP OF WATER: Cymbalta, Gabapentin, Prilosec, Oxycodone (if needed), Lithium, Primidone   Do not wear jewelry, make-up or nail polish.  Do not wear lotions, powders, or perfumes. You may wear deodorant.  Do not shave 48 hours prior to surgery. Men may shave face and neck.  Do not bring valuables to the hospital.  [facility] is not responsible                  for any belongings or valuables.               Contacts, dentures or bridgework may not be worn into surgery.  Leave suitcase in the car. After surgery it may be brought to your room.  For patients admitted to the hospital, discharge time is determined by your                treatment team.               Patients discharged the day of surgery will not be allowed to drive  home.  Name and phone number of your driver: Family/ Friend  Special Instructions: Shower using CHG 2 nights before surgery and the night before surgery.  If you shower the day of surgery use CHG.  Use special wash - you have one bottle of CHG for all showers.  You should use approximately 1/3 of the bottle for each shower.   Please read over the following fact sheets that you were given: Pain Booklet, Coughing and Deep Breathing and Surgical Site Infection Prevention

## 2013-05-03 ENCOUNTER — Encounter (HOSPITAL_COMMUNITY)
Admission: RE | Admit: 2013-05-03 | Discharge: 2013-05-03 | Disposition: A | Discharge status: Home / Self Care | Payer: Medicare Other | Source: Ambulatory Visit | Attending: Anesthesiology | Admitting: Anesthesiology

## 2013-05-03 ENCOUNTER — Encounter (HOSPITAL_COMMUNITY): Payer: Self-pay

## 2013-05-03 ENCOUNTER — Encounter (HOSPITAL_COMMUNITY)
Admission: RE | Admit: 2013-05-03 | Discharge: 2013-05-03 | Disposition: A | Discharge status: Home / Self Care | Payer: Medicare Other | Source: Ambulatory Visit | Attending: Interventional Radiology | Admitting: Interventional Radiology

## 2013-05-03 HISTORY — DX: Unspecified osteoarthritis, unspecified site: M19.90

## 2013-05-03 HISTORY — DX: Personal history of other diseases of the digestive system: Z87.19

## 2013-05-03 LAB — CBC WITH DIFFERENTIAL/PLATELET
Basophils Relative: 0 % (ref 0–1)
Eosinophils Relative: 3 % (ref 0–5)
HCT: 44.2 % (ref 36.0–46.0)
Hemoglobin: 14.5 g/dL (ref 12.0–15.0)
MCH: 32.4 pg (ref 26.0–34.0)
MCHC: 32.8 g/dL (ref 30.0–36.0)
MCV: 98.9 fL (ref 78.0–100.0)
Monocytes Absolute: 0.5 10*3/uL (ref 0.1–1.0)
Monocytes Relative: 5 % (ref 3–12)
Neutro Abs: 7.5 10*3/uL (ref 1.7–7.7)
Neutrophils Relative %: 75 % (ref 43–77)
RDW: 13.4 % (ref 11.5–15.5)

## 2013-05-03 LAB — BASIC METABOLIC PANEL
BUN: 4 mg/dL — ABNORMAL LOW (ref 6–23)
CO2: 26 mEq/L (ref 19–32)
Chloride: 106 mEq/L (ref 96–112)
Creatinine, Ser: 0.96 mg/dL (ref 0.50–1.10)
GFR calc Af Amer: 76 mL/min — ABNORMAL LOW (ref >90)
Glucose, Bld: 93 mg/dL (ref 70–99)
Potassium: 4 mEq/L (ref 3.5–5.1)

## 2013-05-03 LAB — PROTIME-INR: INR: 1.03 (ref 0.00–1.49)

## 2013-05-03 NOTE — Progress Notes (Signed)
Pam turpin called for consent order .will try to put one in but if not able will address day of procedure.

## 2013-05-05 ENCOUNTER — Encounter (HOSPITAL_COMMUNITY)
Admission: RE | Disposition: A | Discharge status: Home / Self Care | Payer: Self-pay | Source: Ambulatory Visit | Attending: Interventional Radiology

## 2013-05-05 ENCOUNTER — Encounter (HOSPITAL_COMMUNITY): Payer: Self-pay | Admitting: Critical Care Medicine

## 2013-05-05 ENCOUNTER — Encounter (HOSPITAL_COMMUNITY): Payer: Self-pay | Admitting: Pharmacy Technician

## 2013-05-05 ENCOUNTER — Encounter (HOSPITAL_COMMUNITY): Payer: Self-pay

## 2013-05-05 ENCOUNTER — Ambulatory Visit (HOSPITAL_COMMUNITY)
Admission: RE | Admit: 2013-05-05 | Discharge: 2013-05-05 | Disposition: A | Discharge status: Home / Self Care | Payer: Medicare Other | Source: Ambulatory Visit | Attending: Interventional Radiology | Admitting: Interventional Radiology

## 2013-05-05 DIAGNOSIS — Z01818 Encounter for other preprocedural examination: Secondary | ICD-10-CM | POA: Insufficient documentation

## 2013-05-05 DIAGNOSIS — Z538 Procedure and treatment not carried out for other reasons: Secondary | ICD-10-CM | POA: Insufficient documentation

## 2013-05-05 SURGERY — RADIOLOGY WITH ANESTHESIA
Anesthesia: Monitor Anesthesia Care

## 2013-05-05 MED ORDER — SODIUM CHLORIDE 0.9 % IV SOLN: INTRAVENOUS | Status: DC | PRN

## 2013-05-05 MED ORDER — CEFAZOLIN SODIUM-DEXTROSE 2-3 GM-% IV SOLR
INTRAVENOUS | Status: AC
  Filled 2013-05-05: qty 50

## 2013-05-05 MED ORDER — LACTATED RINGERS IV SOLN
INTRAVENOUS | Status: DC
  Administered 2013-05-10 (×2): via INTRAVENOUS

## 2013-05-05 MED ORDER — CEFAZOLIN SODIUM-DEXTROSE 2-3 GM-% IV SOLR: 2.0000 g | Freq: Once | INTRAVENOUS | Status: DC

## 2013-05-05 NOTE — H&P (Signed)
[name] is an 57 y.o. female.   Chief Complaint: Hx multiple intracranial aneurysms. 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 Followed with Neuro Interventional Radiology since then. New headaches developed 1-2 months ago. Most recent 03/2013 arteriogram shows good obliteration of all aneurysms except still shows no change in size of basilar artery aneurysm--no larger but no smaller. Pt was consulted with Dr Corliss Skains 03/2013 and results were discussed. Rec: to place coils and ornew telescoping stent over basilar artery aneurysm. Pt high risk- still smokes 1ppd. Scheduled now for this procedure with anesthesia.  HPI: smoker; depression; OCD; migraines; cerebral aneurysms; fibromyalgia; GERD; IBS; COPD  Past Medical History  Diagnosis Date  . Depression     bipolar  . OCD (obsessive compulsive disorder)     anxiety  . Migraines   . Brain aneurysm   . Chronic abdominal pain   . History of colonic polyps   . Peripheral neuropathy   . Fibromyalgia   . HLD (hyperlipidemia)   . GERD (gastroesophageal reflux disease)   . IBS (irritable bowel syndrome)     chronic constipation  . Allergic rhinitis   . Gastroparesis   . COPD (chronic obstructive pulmonary disease)     denies  . H/O hiatal hernia   . Arthritis     Past Surgical History  Procedure Laterality Date  . Appendectomy    . Cholecystectomy    . Vesicovaginal fistula closure w/ tah    . Oophorectomy    . Coil to brain aneurysm    . Shoulder surgery Left   . Elbow surgery Bilateral     tendonitis  . Abdominal hysterectomy    . Cervical disc surgery      Family History  Problem Relation Age of Onset  . Coronary artery disease      family hx  . Cancer      family hx  . Diabetes      DM - family hx  . Hypertension      family hx  . Seizures      family hx  . Colon cancer Neg Hx   . Cancer Father     lung  . Cancer Sister     stomach  .  Cancer Mother     brain  . Leukemia Sister    Social History:  reports that she has been smoking Cigarettes.  She has a 39 pack-year smoking history. She does not have any smokeless tobacco history on file. She reports that she does not drink alcohol or use illicit drugs.  Allergies:  Allergies  Allergen Reactions  . Hydrocodone Nausea Only    Can take it if its in a cough syrup   . Nsaids     REACTION: GI irritation  . Propoxyphene-Acetaminophen Itching  . Tramadol     REACTION: nausea  . Varenicline Tartrate     REACTION: agitation, depression, sick to stomach  . Chantix [Varenicline] Other (See Comments)    "Makes bipolar worse"  . Other Other (See Comments)    Lactose intolerant     (Not in a hospital admission)  Results for orders placed during the hospital encounter of 05/03/13 (from the past 48 hour(s))  APTT     Status: None   Collection Time    05/03/13  1:47 PM      Result Value Range   aPTT 28  24 - 37 seconds  BASIC METABOLIC PANEL  Status: Abnormal   Collection Time    05/03/13  1:47 PM      Result Value Range   Sodium 140  135 - 145 mEq/L   Potassium 4.0  3.5 - 5.1 mEq/L   Chloride 106  96 - 112 mEq/L   CO2 26  19 - 32 mEq/L   Glucose, Bld 93  70 - 99 mg/dL   BUN 4 (*) 6 - 23 mg/dL   Creatinine, Ser 1.61  0.50 - 1.10 mg/dL   Calcium 9.2  8.4 - 09.6 mg/dL   GFR calc non Af Amer 65 (*) >90 mL/min   GFR calc Af Amer 76 (*) >90 mL/min   Comment: (NOTE)     The eGFR has been calculated using the CKD EPI equation.     This calculation has not been validated in all clinical situations.     eGFR's persistently <90 mL/min signify possible Chronic Kidney     Disease.  CBC WITH DIFFERENTIAL     Status: None   Collection Time    05/03/13  1:47 PM      Result Value Range   WBC 10.0  4.0 - 10.5 K/uL   RBC 4.47  3.87 - 5.11 MIL/uL   Hemoglobin 14.5  12.0 - 15.0 g/dL   HCT 04.5  40.9 - 81.1 %   MCV 98.9  78.0 - 100.0 fL   MCH 32.4  26.0 - 34.0 pg   MCHC  32.8  30.0 - 36.0 g/dL   RDW 91.4  78.2 - 95.6 %   Platelets 228  150 - 400 K/uL   Neutrophils Relative % 75  43 - 77 %   Neutro Abs 7.5  1.7 - 7.7 K/uL   Lymphocytes Relative 17  12 - 46 %   Lymphs Abs 1.7  0.7 - 4.0 K/uL   Monocytes Relative 5  3 - 12 %   Monocytes Absolute 0.5  0.1 - 1.0 K/uL   Eosinophils Relative 3  0 - 5 %   Eosinophils Absolute 0.3  0.0 - 0.7 K/uL   Basophils Relative 0  0 - 1 %   Basophils Absolute 0.0  0.0 - 0.1 K/uL  PROTIME-INR     Status: None   Collection Time    05/03/13  1:47 PM      Result Value Range   Prothrombin Time 13.3  11.6 - 15.2 seconds   INR 1.03  0.00 - 1.49   Dg Chest 2 View  05/03/2013   CLINICAL DATA:  Preop  EXAM: CHEST  2 VIEW  COMPARISON:  None.  FINDINGS: Anterior cervical fusion noted. Normal mediastinum and cardiac silhouette. Normal pulmonary vasculature. No evidence of effusion, infiltrate, or pneumothorax. No acute bony abnormality. There is a compression deformity of the T11 vertebral body which appears chronic.  IMPRESSION: No active cardiopulmonary disease.   Electronically Signed   By: Genevive Bi M.D.   On: 05/03/2013 15:20    Review of Systems  Constitutional: Negative for fever and weight loss.  HENT: Negative for hearing loss.   Eyes: Negative for blurred vision and double vision.  Respiratory: Positive for cough and shortness of breath.   Cardiovascular: Negative for chest pain.  Gastrointestinal: Negative for nausea, vomiting and abdominal pain.  Musculoskeletal: Negative for back pain.  Neurological: Positive for headaches. Negative for dizziness and weakness.  Psychiatric/Behavioral: Positive for substance abuse.       Smokes 1ppd    There were no vitals taken for  this visit. Physical Exam  Constitutional: She is oriented to person, place, and time. She appears well-nourished.  Eyes: EOM are normal.  Neck: Normal range of motion.  Cardiovascular: Normal rate, regular rhythm and normal heart sounds.    No murmur heard. Respiratory: Effort normal and breath sounds normal. She has no wheezes.  GI: Soft. Bowel sounds are normal. There is no tenderness.  Musculoskeletal: Normal range of motion. She exhibits no edema and no tenderness.  Neurological: She is alert and oriented to person, place, and time. No cranial nerve deficit. Coordination normal.  Skin: Skin is warm and dry.  Psychiatric: She has a normal mood and affect. Her behavior is normal. Judgment and thought content normal.     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  Emmi Wertheim A 05/05/2013, 7:38 AM

## 2013-05-05 NOTE — Preoperative (Addendum)
Beta Blockers   Reason not to administer Beta Blockers:Not Applicable 

## 2013-05-05 NOTE — Anesthesia Postprocedure Evaluation (Signed)
  Anesthesia Post-op Note  Patient: [name]  Procedure(s) Performed: Procedure(s): RADIOLOGY WITH ANESTHESIA (N/A)  Case canceled

## 2013-05-05 NOTE — Anesthesia Preprocedure Evaluation (Addendum)
Anesthesia Evaluation  Patient identified by MRN, date of birth, ID band Patient awake    Reviewed: Allergy & Precautions, H&P , NPO status   Airway Mallampati: I TM Distance: >3 FB Neck ROM: Full    Dental  (+) Poor Dentition and Dental Advisory Given   Pulmonary COPDCurrent Smoker,  breath sounds clear to auscultation        Cardiovascular + Peripheral Vascular Disease Rhythm:Regular Rate:Normal     Neuro/Psych  Headaches, Anxiety Depression  Neuromuscular disease    GI/Hepatic hiatal hernia, GERD-  Medicated and Controlled,  Endo/Other    Renal/GU      Musculoskeletal  (+) Fibromyalgia -  Abdominal   Peds  Hematology   Anesthesia Other Findings   Reproductive/Obstetrics                         Anesthesia Physical Anesthesia Plan  ASA: III  Anesthesia Plan: General and MAC   Post-op Pain Management:    Induction: Intravenous  Airway Management Planned: Oral ETT  Additional Equipment: Arterial line  Intra-op Plan:   Post-operative Plan: Extubation in OR  Informed Consent: I have reviewed the patients History and Physical, chart, labs and discussed the procedure including the risks, benefits and alternatives for the proposed anesthesia with the patient or authorized representative who has indicated his/her understanding and acceptance.   Dental advisory given  Plan Discussed with: Anesthesiologist, CRNA and Surgeon  Anesthesia Plan Comments:        Anesthesia Quick Evaluation

## 2013-05-05 NOTE — Transfer of Care (Signed)
Immediate Anesthesia Transfer of Care Note  Patient: [name]  Procedure(s) Performed: Procedure(s): RADIOLOGY WITH ANESTHESIA (N/A)  Case canceled

## 2013-05-06 ENCOUNTER — Other Ambulatory Visit (HOSPITAL_COMMUNITY): Payer: Self-pay | Admitting: Radiology

## 2013-05-07 ENCOUNTER — Encounter (HOSPITAL_COMMUNITY): Payer: Self-pay | Admitting: *Deleted

## 2013-05-10 ENCOUNTER — Encounter (HOSPITAL_COMMUNITY)
Admission: RE | Disposition: A | Discharge status: Home / Self Care | Payer: Self-pay | Source: Ambulatory Visit | Attending: Interventional Radiology

## 2013-05-10 ENCOUNTER — Ambulatory Visit (HOSPITAL_COMMUNITY)
Admission: RE | Admit: 2013-05-10 | Discharge: 2013-05-10 | Disposition: A | Discharge status: Home / Self Care | Payer: Medicare Other | Source: Ambulatory Visit | Attending: Interventional Radiology | Admitting: Interventional Radiology

## 2013-05-10 ENCOUNTER — Encounter (HOSPITAL_COMMUNITY): Payer: Medicare Other | Admitting: Certified Registered Nurse Anesthetist

## 2013-05-10 ENCOUNTER — Ambulatory Visit (HOSPITAL_COMMUNITY): Payer: Medicare Other | Admitting: Certified Registered Nurse Anesthetist

## 2013-05-10 ENCOUNTER — Inpatient Hospital Stay (HOSPITAL_COMMUNITY)
Admission: RE | Admit: 2013-05-10 | Discharge: 2013-05-11 | DRG: 027 | Disposition: A | Discharge status: Home / Self Care | Payer: Medicare Other | Source: Ambulatory Visit | Attending: Interventional Radiology | Admitting: Interventional Radiology

## 2013-05-10 ENCOUNTER — Encounter (HOSPITAL_COMMUNITY): Payer: Self-pay | Admitting: Surgery

## 2013-05-10 DIAGNOSIS — Z79899 Other long term (current) drug therapy: Secondary | ICD-10-CM

## 2013-05-10 DIAGNOSIS — J449 Chronic obstructive pulmonary disease, unspecified: Secondary | ICD-10-CM | POA: Diagnosis present

## 2013-05-10 DIAGNOSIS — Z7982 Long term (current) use of aspirin: Secondary | ICD-10-CM

## 2013-05-10 DIAGNOSIS — G609 Hereditary and idiopathic neuropathy, unspecified: Secondary | ICD-10-CM | POA: Diagnosis present

## 2013-05-10 DIAGNOSIS — I959 Hypotension, unspecified: Secondary | ICD-10-CM | POA: Diagnosis present

## 2013-05-10 DIAGNOSIS — E785 Hyperlipidemia, unspecified: Secondary | ICD-10-CM | POA: Diagnosis present

## 2013-05-10 DIAGNOSIS — Z7902 Long term (current) use of antithrombotics/antiplatelets: Secondary | ICD-10-CM

## 2013-05-10 DIAGNOSIS — I729 Aneurysm of unspecified site: Secondary | ICD-10-CM

## 2013-05-10 DIAGNOSIS — J4489 Other specified chronic obstructive pulmonary disease: Secondary | ICD-10-CM | POA: Diagnosis present

## 2013-05-10 DIAGNOSIS — F319 Bipolar disorder, unspecified: Secondary | ICD-10-CM | POA: Diagnosis present

## 2013-05-10 DIAGNOSIS — F17200 Nicotine dependence, unspecified, uncomplicated: Secondary | ICD-10-CM | POA: Diagnosis present

## 2013-05-10 DIAGNOSIS — I671 Cerebral aneurysm, nonruptured: Principal | ICD-10-CM | POA: Diagnosis present

## 2013-05-10 HISTORY — PX: RADIOLOGY WITH ANESTHESIA: SHX6223

## 2013-05-10 LAB — POCT ACTIVATED CLOTTING TIME
Activated Clotting Time: 181 seconds
Activated Clotting Time: 170 seconds
Activated Clotting Time: 170 seconds

## 2013-05-10 LAB — HEPARIN LEVEL (UNFRACTIONATED): Heparin Unfractionated: <0.1 IU/mL — ABNORMAL LOW (ref 0.30–0.70)

## 2013-05-10 LAB — PLATELET INHIBITION P2Y12: Platelet Function  P2Y12: 171 [PRU] — ABNORMAL LOW (ref 194–418)

## 2013-05-10 SURGERY — RADIOLOGY WITH ANESTHESIA
Anesthesia: Monitor Anesthesia Care

## 2013-05-10 MED ORDER — MIDAZOLAM HCL 5 MG/5ML IJ SOLN
INTRAMUSCULAR | Status: DC | PRN
  Administered 2013-05-10: 1 mg via INTRAVENOUS

## 2013-05-10 MED ORDER — CLOPIDOGREL BISULFATE 75 MG PO TABS
75.0000 mg | ORAL_TABLET | Freq: Every day | ORAL | Status: DC
  Administered 2013-05-11: 75 mg via ORAL
  Filled 2013-05-10 (×2): qty 1

## 2013-05-10 MED ORDER — FENTANYL CITRATE 0.05 MG/ML IJ SOLN
INTRAMUSCULAR | Status: DC | PRN
  Administered 2013-05-10: 100 ug via INTRAVENOUS

## 2013-05-10 MED ORDER — LURASIDONE HCL 80 MG PO TABS
120.0000 mg | ORAL_TABLET | Freq: Every day | ORAL | Status: DC
  Filled 2013-05-10 (×2): qty 3

## 2013-05-10 MED ORDER — LITHIUM CARBONATE 300 MG PO CAPS
300.0000 mg | ORAL_CAPSULE | Freq: Once | ORAL | Status: AC
  Administered 2013-05-10: 300 mg via ORAL
  Filled 2013-05-10: qty 1

## 2013-05-10 MED ORDER — NEOSTIGMINE METHYLSULFATE 1 MG/ML IJ SOLN
INTRAMUSCULAR | Status: DC | PRN
  Administered 2013-05-10: 3.5 mg via INTRAVENOUS

## 2013-05-10 MED ORDER — ASPIRIN 325 MG PO TABS
325.0000 mg | ORAL_TABLET | Freq: Every day | ORAL | Status: DC
  Administered 2013-05-11: 325 mg via ORAL
  Filled 2013-05-10: qty 1

## 2013-05-10 MED ORDER — CLOPIDOGREL BISULFATE 75 MG PO TABS: 75.0000 mg | ORAL_TABLET | Freq: Every day | ORAL | Status: DC

## 2013-05-10 MED ORDER — SODIUM CHLORIDE 0.9 % IV SOLN
INTRAVENOUS | Status: DC
  Administered 2013-05-10: 15:00:00 via INTRAVENOUS

## 2013-05-10 MED ORDER — ONDANSETRON HCL 4 MG/2ML IJ SOLN: 4.0000 mg | Freq: Four times a day (QID) | INTRAMUSCULAR | Status: DC | PRN

## 2013-05-10 MED ORDER — ASPIRIN 81 MG PO CHEW
81.0000 mg | CHEWABLE_TABLET | Freq: Every day | ORAL | Status: DC
  Filled 2013-05-10: qty 1

## 2013-05-10 MED ORDER — HEPARIN SODIUM (PORCINE) 1000 UNIT/ML IJ SOLN
INTRAMUSCULAR | Status: DC | PRN
  Administered 2013-05-10 (×3): 500 [IU] via INTRAVENOUS
  Administered 2013-05-10: 3000 [IU] via INTRAVENOUS
  Administered 2013-05-10: 500 [IU] via INTRAVENOUS

## 2013-05-10 MED ORDER — PROPOFOL 10 MG/ML IV BOLUS
INTRAVENOUS | Status: DC | PRN
  Administered 2013-05-10: 150 mg via INTRAVENOUS

## 2013-05-10 MED ORDER — HEPARIN (PORCINE) IN NACL 100-0.45 UNIT/ML-% IJ SOLN
650.0000 [IU]/h | INTRAMUSCULAR | Status: AC
  Administered 2013-05-10: 600 [IU]/h via INTRAVENOUS
  Filled 2013-05-10: qty 250

## 2013-05-10 MED ORDER — BIOTENE DRY MOUTH MT LIQD
15.0000 mL | Freq: Two times a day (BID) | OROMUCOSAL | Status: DC
  Administered 2013-05-10 – 2013-05-11 (×2): 15 mL via OROMUCOSAL

## 2013-05-10 MED ORDER — NICARDIPINE HCL IN NACL 20-0.86 MG/200ML-% IV SOLN: 5.0000 mg/h | INTRAVENOUS | Status: DC

## 2013-05-10 MED ORDER — LIDOCAINE HCL (CARDIAC) 20 MG/ML IV SOLN
INTRAVENOUS | Status: DC | PRN
  Administered 2013-05-10: 60 mg via INTRAVENOUS

## 2013-05-10 MED ORDER — PROMETHAZINE HCL 25 MG/ML IJ SOLN: 6.2500 mg | INTRAMUSCULAR | Status: DC | PRN

## 2013-05-10 MED ORDER — ASPIRIN 325 MG PO TABS
325.0000 mg | ORAL_TABLET | Freq: Every day | ORAL | Status: DC
  Filled 2013-05-10 (×2): qty 1

## 2013-05-10 MED ORDER — PANTOPRAZOLE SODIUM 40 MG PO TBEC
40.0000 mg | DELAYED_RELEASE_TABLET | Freq: Every day | ORAL | Status: DC
  Administered 2013-05-11: 40 mg via ORAL
  Filled 2013-05-10 (×2): qty 1

## 2013-05-10 MED ORDER — GLYCOPYRROLATE 0.2 MG/ML IJ SOLN
INTRAMUSCULAR | Status: DC | PRN
  Administered 2013-05-10: 0.6 mg via INTRAVENOUS

## 2013-05-10 MED ORDER — DULOXETINE HCL 60 MG PO CPEP
60.0000 mg | ORAL_CAPSULE | Freq: Every day | ORAL | Status: DC
  Administered 2013-05-11: 60 mg via ORAL
  Filled 2013-05-10 (×2): qty 1

## 2013-05-10 MED ORDER — IOHEXOL 300 MG/ML  SOLN
150.0000 mL | Freq: Once | INTRAMUSCULAR | Status: AC | PRN
  Administered 2013-05-10: 102 mL via INTRAVENOUS

## 2013-05-10 MED ORDER — CEFAZOLIN SODIUM-DEXTROSE 2-3 GM-% IV SOLR
2.0000 g | Freq: Once | INTRAVENOUS | Status: AC
  Administered 2013-05-10: 2 g via INTRAVENOUS
  Filled 2013-05-10 (×2): qty 50

## 2013-05-10 MED ORDER — LABETALOL HCL 5 MG/ML IV SOLN
INTRAVENOUS | Status: DC | PRN
  Administered 2013-05-10 (×2): 2.5 mg via INTRAVENOUS

## 2013-05-10 MED ORDER — ASPIRIN EC 81 MG PO TBEC: 81.0000 mg | DELAYED_RELEASE_TABLET | Freq: Once | ORAL | Status: DC

## 2013-05-10 MED ORDER — FENTANYL CITRATE 0.05 MG/ML IJ SOLN: 25.0000 ug | INTRAMUSCULAR | Status: DC | PRN

## 2013-05-10 MED ORDER — SODIUM CHLORIDE 0.9 % IV SOLN: Freq: Once | INTRAVENOUS | Status: DC

## 2013-05-10 MED ORDER — PRIMIDONE 50 MG PO TABS
50.0000 mg | ORAL_TABLET | Freq: Two times a day (BID) | ORAL | Status: DC
  Administered 2013-05-10: 50 mg via ORAL
  Administered 2013-05-10: 100 mg via ORAL
  Administered 2013-05-11: 50 mg via ORAL
  Filled 2013-05-10 (×4): qty 2

## 2013-05-10 MED ORDER — PHENYLEPHRINE HCL 10 MG/ML IJ SOLN
10.0000 mg | INTRAVENOUS | Status: DC | PRN
  Administered 2013-05-10: 20 ug/min via INTRAVENOUS
  Administered 2013-05-10: 50 ug/min via INTRAVENOUS

## 2013-05-10 MED ORDER — ONDANSETRON HCL 4 MG/2ML IJ SOLN
INTRAMUSCULAR | Status: DC | PRN
  Administered 2013-05-10: 4 mg via INTRAVENOUS

## 2013-05-10 MED ORDER — BENZTROPINE MESYLATE 1 MG PO TABS
1.0000 mg | ORAL_TABLET | Freq: Two times a day (BID) | ORAL | Status: DC
  Administered 2013-05-10 – 2013-05-11 (×2): 1 mg via ORAL
  Filled 2013-05-10 (×4): qty 1

## 2013-05-10 MED ORDER — ROCURONIUM BROMIDE 100 MG/10ML IV SOLN
INTRAVENOUS | Status: DC | PRN
  Administered 2013-05-10: 50 mg via INTRAVENOUS
  Administered 2013-05-10: 20 mg via INTRAVENOUS

## 2013-05-10 MED ORDER — POTASSIUM CHLORIDE CRYS ER 10 MEQ PO TBCR
10.0000 meq | EXTENDED_RELEASE_TABLET | Freq: Every day | ORAL | Status: DC
  Administered 2013-05-10 – 2013-05-11 (×2): 10 meq via ORAL
  Filled 2013-05-10 (×2): qty 1

## 2013-05-10 MED ORDER — GABAPENTIN 300 MG PO CAPS
900.0000 mg | ORAL_CAPSULE | Freq: Every day | ORAL | Status: DC
  Administered 2013-05-10: 900 mg via ORAL
  Filled 2013-05-10 (×2): qty 3

## 2013-05-10 MED ORDER — ATORVASTATIN CALCIUM 40 MG PO TABS
40.0000 mg | ORAL_TABLET | Freq: Every day | ORAL | Status: DC
  Administered 2013-05-10: 40 mg via ORAL
  Filled 2013-05-10 (×3): qty 1

## 2013-05-10 MED ORDER — LURASIDONE HCL 120 MG PO TABS
120.0000 mg | ORAL_TABLET | Freq: Every day | ORAL | Status: DC
Start: 2013-05-10 — End: 2013-05-10
  Filled 2013-05-10 (×3): qty 1

## 2013-05-10 MED ORDER — RISPERIDONE 2 MG PO TABS
4.0000 mg | ORAL_TABLET | Freq: Every day | ORAL | Status: DC
  Administered 2013-05-10: 4 mg via ORAL
  Filled 2013-05-10 (×2): qty 2

## 2013-05-10 MED ORDER — VITAMIN B-12 1000 MCG PO TABS
1000.0000 ug | ORAL_TABLET | Freq: Every day | ORAL | Status: DC
  Administered 2013-05-10 – 2013-05-11 (×2): 1000 ug via ORAL
  Filled 2013-05-10 (×2): qty 1

## 2013-05-10 MED ORDER — LITHIUM CARBONATE 300 MG PO CAPS
300.0000 mg | ORAL_CAPSULE | Freq: Every day | ORAL | Status: DC
  Administered 2013-05-11: 300 mg via ORAL
  Filled 2013-05-10 (×2): qty 1

## 2013-05-10 MED ORDER — NIMODIPINE 30 MG PO CAPS: 60.0000 mg | ORAL_CAPSULE | ORAL | Status: DC

## 2013-05-10 MED ORDER — KETOROLAC TROMETHAMINE 30 MG/ML IJ SOLN
30.0000 mg | Freq: Three times a day (TID) | INTRAMUSCULAR | Status: DC | PRN
  Administered 2013-05-10: 30 mg via INTRAVENOUS
  Filled 2013-05-10: qty 1

## 2013-05-10 MED ORDER — LURASIDONE HCL 80 MG PO TABS
120.0000 mg | ORAL_TABLET | Freq: Every day | ORAL | Status: DC
  Filled 2013-05-10: qty 3

## 2013-05-10 MED ORDER — CLOPIDOGREL BISULFATE 75 MG PO TABS: 75.0000 mg | ORAL_TABLET | Freq: Once | ORAL | Status: DC

## 2013-05-10 MED ORDER — OXYCODONE HCL 5 MG PO TABS: 15.0000 mg | ORAL_TABLET | Freq: Four times a day (QID) | ORAL | Status: DC | PRN

## 2013-05-10 MED ORDER — NITROGLYCERIN 1 MG/10 ML FOR IR/CATH LAB
INTRA_ARTERIAL | Status: AC
  Filled 2013-05-10: qty 10

## 2013-05-10 MED ORDER — HEPARIN (PORCINE) IN NACL 100-0.45 UNIT/ML-% IJ SOLN: 500.0000 [IU]/h | INTRAMUSCULAR | Status: DC

## 2013-05-10 MED ORDER — LURASIDONE HCL 40 MG PO TABS
120.0000 mg | ORAL_TABLET | Freq: Every day | ORAL | Status: DC
  Administered 2013-05-10: 120 mg via ORAL
  Filled 2013-05-10 (×3): qty 3

## 2013-05-10 MED ORDER — LACTATED RINGERS IV SOLN
INTRAVENOUS | Status: DC | PRN
  Administered 2013-05-10: 08:00:00 via INTRAVENOUS

## 2013-05-10 MED ORDER — LITHIUM CARBONATE 300 MG PO CAPS
600.0000 mg | ORAL_CAPSULE | Freq: Every day | ORAL | Status: DC
  Administered 2013-05-10: 600 mg via ORAL
  Filled 2013-05-10 (×2): qty 2

## 2013-05-10 NOTE — Progress Notes (Signed)
Patient informed Nurse that she took Aspirin and Plavix this morning at 0400, therefore the ordered dose of the Aspirin and Plavix was discontinued. Nimodipine held due to blood pressure being less that 120.

## 2013-05-10 NOTE — Progress Notes (Signed)
  Subjective:  Post procedure  More awake  Alert. C/O a 7/10 low back pain without any other symptoms,  Or  diaphoresis or low BP  Says is part of her chronic low back ache. Denies any HA ,N/V ,visual motor,sensory  Or speech difficulties. Able to tolerate liquids. Denies any SOB or chest pains.  Objective: Vital signs in last 24 hours: Temp:  [97.6 F (36.4 C)-98.4 F (36.9 C)] 97.9 F (36.6 C) (11/24 1549) Pulse Rate:  [69-80] 74 (11/24 1600) Resp:  [12-20] 13 (11/24 1600) BP: (95-110)/(39-57) 104/39 mmHg (11/24 1600) SpO2:  [93 %-99 %] 96 % (11/24 1600) Arterial Line BP: (107-126)/(52-64) 119/59 mmHg (11/24 1600) Weight:  [152 lb 8.9 oz (69.2 kg)-152 lb 9 oz (69.202 kg)] 152 lb 8.9 oz (69.2 kg) (11/24 1430)    Intake/Output from previous day:   Intake/Output this shift: Total I/O In: 313.5 [I.V.:313.5] Out: 1250 [Urine:1175; Blood:75]  On examination. VS BP 120s/70s HR 70s SR.PaO2 > 95% on 2l 02 via [place].  Neurologically. Alert, awake oriented to time ,place and space.Marland Kitchen  Speech and comprehension clear.Marland Kitchen  PEARLA.Marland Kitchen 3.16mm R=L  EOMS full..No nystagmus or or induced diplopia.  Visual Fields.Full to confrontation  No Facial asymmetry.  Tongue Midline..  Motor..           NO drift of outstretched arms..          Power.5/5 Proximally and distally all four extremities..  Fine motor and coordination to finger to nose equal..  Gait  Not tested  Romberg Not tested  Heel to toe.Not tested. Rt groin soft . No palpable hematoma.   Lab Results:  No results found for this basename: WBC, HGB, HCT, PLT,  in the last 72 hours BMET No results found for this basename: NA, K, CL, CO2, GLUCOSE, BUN, CREATININE, CALCIUM,  in the last 72 hours PT/INR No results found for this basename: LABPROT, INR,  in the last 72 hours ABG No results found for this basename: PHART, PCO2, PO2, HCO3,  in the last 72 hours  Studies/Results: No results  found.  Anti-infectives: Anti-infectives   None      Assessment/Plan: S/P Placement  Of LVIS junior devise across RT sidewall distal basilar aneurysm.  Plan . Continue iv heparin with strict BP control. Add IV toradol and po oxycodone for LBA. Close neuro Obs. Advance to liquid diet as tolerated.. D/W patient and son  Oneal Grout 05/10/2013

## 2013-05-10 NOTE — Progress Notes (Signed)
ANTICOAGULATION CONSULT NOTE - Initial Consult  Pharmacy Consult for Heparin Indication: s/p cerebral stent  Allergies  Allergen Reactions  . Hydrocodone Nausea Only    Can take it if its in a cough syrup   . Nsaids     REACTION: GI irritation  . Propoxyphene-Acetaminophen Itching  . Tramadol     REACTION: nausea  . Varenicline Tartrate     REACTION: agitation, depression, sick to stomach  . Chantix [Varenicline] Other (See Comments)    "Makes bipolar worse"  . Other Other (See Comments)    Lactose intolerant    Patient Measurements: Height: 5\' 5"  (165.1 cm) Weight: 152 lb 8.9 oz (69.2 kg) IBW/kg (Calculated) : 57 Heparin Dosing Weight: 69 kg  Vital Signs: Temp: 98.9 F (37.2 C) (11/24 2004) Temp src: Oral (11/24 2004) BP: 107/52 mmHg (11/24 2100) Pulse Rate: 68 (11/24 2100)  Labs:  Recent Labs  05/10/13 2040  HEPARINUNFRC <0.10*    Estimated Creatinine Clearance: 64.7 ml/min (by C-G formula based on Cr of 0.96).   Medical History: Past Medical History  Diagnosis Date  . Depression     bipolar  . OCD (obsessive compulsive disorder)     anxiety  . Migraines   . Brain aneurysm   . Chronic abdominal pain   . History of colonic polyps   . Peripheral neuropathy   . Fibromyalgia   . HLD (hyperlipidemia)   . GERD (gastroesophageal reflux disease)   . IBS (irritable bowel syndrome)     chronic constipation  . Allergic rhinitis   . Gastroparesis   . COPD (chronic obstructive pulmonary disease)     denies  . H/O hiatal hernia   . Arthritis     Medications:  Prescriptions prior to admission  Medication Sig Dispense Refill  . aspirin 81 MG EC tablet Take 81 mg by mouth daily.        Marland Kitchen atorvastatin (LIPITOR) 40 MG tablet Take 40 mg by mouth daily.      . clopidogrel (PLAVIX) 75 MG tablet Take 75 mg by mouth daily with breakfast.      . DULoxetine (CYMBALTA) 20 MG capsule Take 60 mg by mouth daily.       Marland Kitchen gabapentin (NEURONTIN) 300 MG capsule Take 900  mg by mouth at bedtime.       Marland Kitchen lithium 300 MG tablet Take 300-600 mg by mouth 2 (two) times daily. Takes 300 mg in the morning and 600 mg at bedtime      . Lurasidone HCl (LATUDA) 120 MG TABS Take 120 mg by mouth at bedtime.      Marland Kitchen omeprazole (PRILOSEC) 40 MG capsule Take 40 mg by mouth daily.      Marland Kitchen oxyCODONE (ROXICODONE) 15 MG immediate release tablet Take 15 mg by mouth 4 (four) times daily as needed for pain.      . potassium chloride (K-DUR,KLOR-CON) 10 MEQ tablet Take 10 mEq by mouth daily.      . risperiDONE (RISPERDAL) 2 MG tablet Take 4 mg by mouth at bedtime.       . benztropine (COGENTIN) 1 MG tablet Take 1 mg by mouth 2 (two) times daily.      . Cholecalciferol (VITAMIN D3) 1000 UNITS CAPS Take 1,000 Units by mouth daily.       . cyanocobalamin 1000 MCG tablet Take 1,000 mcg by mouth daily.       . primidone (MYSOLINE) 50 MG tablet Take 50-100 mg by mouth 2 (two) times daily.  Take 1 tablet in morning and take 2 tablets at bedtime      . vitamin E 400 UNIT capsule Take 400 Units by mouth daily.        Assessment: 57 y.o. female with h/o multiple intracranial aneurysms. s/p cerebral stent placed over basilar artery aneurysm. To begin heparin post procedure - heparin will be d/c 11/25 0700 a.m. Pre-op CBC from 11/17 wnl. heaprin drip 600 uts/hr HL < 0.1  Goal of Therapy:  Heparin level 0.1-0.25 units/ml Monitor platelets by anticoagulation protocol: Yes   Plan:  1. Slight  Increase Heparin gtt at 650 units/hr Heparin drip will be d/c at 0700 11/125 therefore will not check anymore HL  Leota Sauers Pharm.D. CPP, BCPS Clinical Pharmacist [phone] 05/10/2013 10:23 PM

## 2013-05-10 NOTE — Anesthesia Procedure Notes (Signed)
Procedure Name: Intubation Date/Time: 05/10/2013 9:20 AM Performed by: Reine Just Pre-anesthesia Checklist: Patient identified, Emergency Drugs available, Suction available, Patient being monitored and Timeout performed Patient Re-evaluated:Patient Re-evaluated prior to inductionOxygen Delivery Method: Circle system utilized and Simple face mask Preoxygenation: Pre-oxygenation with 100% oxygen Intubation Type: IV induction Ventilation: Mask ventilation without difficulty Laryngoscope Size: Miller and 2 Grade View: Grade I Tube type: Oral Tube size: 7.5 mm Number of attempts: 1 Airway Equipment and Method: Patient positioned with wedge pillow and Stylet Secured at: 23 cm Tube secured with: Tape Dental Injury: Teeth and Oropharynx as per pre-operative assessment

## 2013-05-10 NOTE — Progress Notes (Signed)
ANTICOAGULATION CONSULT NOTE - Initial Consult  Pharmacy Consult for Heparin Indication: s/p cerebral stent  Allergies  Allergen Reactions  . Hydrocodone Nausea Only    Can take it if its in a cough syrup   . Nsaids     REACTION: GI irritation  . Propoxyphene-Acetaminophen Itching  . Tramadol     REACTION: nausea  . Varenicline Tartrate     REACTION: agitation, depression, sick to stomach  . Chantix [Varenicline] Other (See Comments)    "Makes bipolar worse"  . Other Other (See Comments)    Lactose intolerant    Patient Measurements: Height: 5\' 5"  (165.1 cm) Weight: 152 lb 9 oz (69.202 kg) IBW/kg (Calculated) : 57 Heparin Dosing Weight: 69 kg  Vital Signs: Temp: 97.6 F (36.4 C) (11/24 1239) Temp src: Oral (11/24 0639) BP: 95/51 mmHg (11/24 1315) Pulse Rate: 78 (11/24 1315)  Labs: No results found for this basename: HGB, HCT, PLT, APTT, LABPROT, INR, HEPARINUNFRC, CREATININE, CKTOTAL, CKMB, TROPONINI,  in the last 72 hours  Estimated Creatinine Clearance: 64.7 ml/min (by C-G formula based on Cr of 0.96).   Medical History: Past Medical History  Diagnosis Date  . Depression     bipolar  . OCD (obsessive compulsive disorder)     anxiety  . Migraines   . Brain aneurysm   . Chronic abdominal pain   . History of colonic polyps   . Peripheral neuropathy   . Fibromyalgia   . HLD (hyperlipidemia)   . GERD (gastroesophageal reflux disease)   . IBS (irritable bowel syndrome)     chronic constipation  . Allergic rhinitis   . Gastroparesis   . COPD (chronic obstructive pulmonary disease)     denies  . H/O hiatal hernia   . Arthritis     Medications:  Prescriptions prior to admission  Medication Sig Dispense Refill  . aspirin 81 MG EC tablet Take 81 mg by mouth daily.        Marland Kitchen atorvastatin (LIPITOR) 40 MG tablet Take 40 mg by mouth daily.      . clopidogrel (PLAVIX) 75 MG tablet Take 75 mg by mouth daily with breakfast.      . DULoxetine (CYMBALTA) 20 MG  capsule Take 60 mg by mouth daily.       Marland Kitchen gabapentin (NEURONTIN) 300 MG capsule Take 900 mg by mouth at bedtime.       Marland Kitchen lithium 300 MG tablet Take 300-600 mg by mouth 2 (two) times daily. Takes 300 mg in the morning and 600 mg at bedtime      . Lurasidone HCl (LATUDA) 120 MG TABS Take 120 mg by mouth at bedtime.      Marland Kitchen omeprazole (PRILOSEC) 40 MG capsule Take 40 mg by mouth daily.      Marland Kitchen oxyCODONE (ROXICODONE) 15 MG immediate release tablet Take 15 mg by mouth 4 (four) times daily as needed for pain.      . potassium chloride (K-DUR,KLOR-CON) 10 MEQ tablet Take 10 mEq by mouth daily.      . risperiDONE (RISPERDAL) 2 MG tablet Take 4 mg by mouth at bedtime.       . benztropine (COGENTIN) 1 MG tablet Take 1 mg by mouth 2 (two) times daily.      . Cholecalciferol (VITAMIN D3) 1000 UNITS CAPS Take 1,000 Units by mouth daily.       . cyanocobalamin 1000 MCG tablet Take 1,000 mcg by mouth daily.       . primidone (MYSOLINE)  50 MG tablet Take 50-100 mg by mouth 2 (two) times daily. Take 1 tablet in morning and take 2 tablets at bedtime      . vitamin E 400 UNIT capsule Take 400 Units by mouth daily.        Assessment: 57 y.o. female with h/o multiple intracranial aneurysms. s/p cerebral stent placed over basilar artery aneurysm. To begin heparin post procedure - heparin will be d/c 11/25 0700 a.m. Pre-op CBC from 11/17 wnl.  Goal of Therapy:  Heparin level 0.1-0.25 units/ml Monitor platelets by anticoagulation protocol: Yes   Plan:  1. Heparin gtt at 600 units/hr 2. Will f/u 6 hr heparin level  Christoper Fabian, PharmD, BCPS Clinical pharmacist, pager [phone] 05/10/2013,1:49 PM

## 2013-05-10 NOTE — H&P (Signed)
[name] is an 57 y.o. female.   Chief Complaint:  Hx multiple intracranial aneurysms.  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  Followed with Neuro Interventional Radiology since then.  New headaches developed 1-2 months ago.  Most recent 03/2013 arteriogram shows good obliteration of all aneurysms except  still shows no change in size of basilar artery aneurysm--no larger but no smaller.  Pt was consulted with Dr Corliss Skains 03/2013 and results were discussed.  Rec: to place coils and or new telescoping stent over basilar artery aneurysm.  Pt high risk- still smokes 1ppd.  Scheduled now for this procedure with anesthesia.   Pt was scheduled for this procedure last week- cancelled because she was not taking Plavix daily.  Plavix Rx was given to pt. She started medication 5 days ago.  P2Y12 171 today  HPI: smoker; depression; OCD; migraines; cerebral aneurysms; fibromyalgia; GERD; IBS; COPD    Past Medical History  Diagnosis Date  . Depression     bipolar  . OCD (obsessive compulsive disorder)     anxiety  . Migraines   . Brain aneurysm   . Chronic abdominal pain   . History of colonic polyps   . Peripheral neuropathy   . Fibromyalgia   . HLD (hyperlipidemia)   . GERD (gastroesophageal reflux disease)   . IBS (irritable bowel syndrome)     chronic constipation  . Allergic rhinitis   . Gastroparesis   . COPD (chronic obstructive pulmonary disease)     denies  . H/O hiatal hernia   . Arthritis     Past Surgical History  Procedure Laterality Date  . Appendectomy    . Cholecystectomy    . Vesicovaginal fistula closure w/ tah    . Oophorectomy    . Coil to brain aneurysm    . Shoulder surgery Left   . Elbow surgery Bilateral     tendonitis  . Abdominal hysterectomy    . Cervical disc surgery    . Colonoscopy    . Tubal ligation      Family History  Problem Relation Age of Onset  . Coronary artery  disease      family hx  . Cancer      family hx  . Diabetes      DM - family hx  . Hypertension      family hx  . Seizures      family hx  . Colon cancer Neg Hx   . Cancer Father     lung  . Cancer Sister     stomach  . Cancer Mother     brain  . Leukemia Sister    Social History:  reports that she has been smoking Cigarettes.  She has a 39 pack-year smoking history. She has never used smokeless tobacco. She reports that she does not drink alcohol or use illicit drugs.  Allergies:  Allergies  Allergen Reactions  . Hydrocodone Nausea Only    Can take it if its in a cough syrup   . Nsaids     REACTION: GI irritation  . Propoxyphene-Acetaminophen Itching  . Tramadol     REACTION: nausea  . Varenicline Tartrate     REACTION: agitation, depression, sick to stomach  . Chantix [Varenicline] Other (See Comments)    "Makes bipolar worse"  . Other Other (See Comments)    Lactose intolerant     (Not in a hospital admission)  Results for  orders placed during the hospital encounter of 05/10/13 (from the past 48 hour(s))  PLATELET INHIBITION P2Y12     Status: Abnormal   Collection Time    05/10/13  6:49 AM      Result Value Range   Platelet Function  P2Y12 171 (*) 194 - 418 PRU   Comment:            The literature has shown a direct     correlation of PRU values over     230 with higher risks of     thrombotic events.  Lower PRU     values are associated with     platelet inhibition.   No results found.  Review of Systems  Constitutional: Negative for fever and weight loss.  Eyes: Negative for blurred vision.  Respiratory: Positive for wheezing.   Cardiovascular: Negative for chest pain.  Gastrointestinal: Negative for nausea, vomiting and abdominal pain.  Musculoskeletal: Positive for falls.  Neurological: Positive for dizziness, weakness and headaches.  Psychiatric/Behavioral: Positive for substance abuse.       Smokes 1ppd    There were no vitals taken for  this visit. Physical Exam  Constitutional: She is oriented to person, place, and time. She appears well-developed and well-nourished.  Eyes: EOM are normal.  Cardiovascular: Normal rate and regular rhythm.   No murmur heard. Respiratory: Effort normal. She has wheezes.  GI: Soft. Bowel sounds are normal. There is no tenderness.  Musculoskeletal: Normal range of motion.  Neurological: She is alert and oriented to person, place, and time.  Skin: Skin is warm and dry.  Psychiatric: She has a normal mood and affect. Her behavior is normal. Judgment and thought content normal.     Assessment/Plan Multiple intracranial aneurysms  R MCA aneurysm stented 06/2005  Basilar artery aneurysm stented 12/2005  New headaches- Cer angiogram 03/2013 shows no decrease in size (no increase either)  High risk pt- smokes 1ppd  Now scheduled for coiling and/or telescoping stent placed over basilar artery aneurysm  Pt has been taking Plavix 75 mg daily x 5 days P2Y12 171 today Pt aware of procedure benefits and risks and agreeable to proceed  Consent signed and in chart  She understands if intervention is performed she will be admitted to Neuro ICU overnight.   [name] 05/10/2013, 8:32 AM

## 2013-05-10 NOTE — Procedures (Signed)
S/P lt vertebral arteriogram followed by placement of LVIS junior across  sidewall basilar aneurysm. ?interval presence of  A  LT basilar artery LT SCA small aneurysm

## 2013-05-10 NOTE — Anesthesia Postprocedure Evaluation (Signed)
  Anesthesia Post-op Note  Patient: [name]  Procedure(s) Performed: Procedure(s): RADIOLOGY WITH ANESTHESIA (N/A)  Patient Location: PACU  Anesthesia Type:General  Level of Consciousness: awake, alert  and oriented  Airway and Oxygen Therapy: Patient Spontanous Breathing and Patient connected to nasal cannula oxygen  Post-op Pain: mild  Post-op Assessment: Post-op Vital signs reviewed  Post-op Vital Signs: Reviewed  Complications: No apparent anesthesia complications

## 2013-05-10 NOTE — OR Nursing (Signed)
(  R) groin site cdi/soft

## 2013-05-10 NOTE — Transfer of Care (Signed)
Immediate Anesthesia Transfer of Care Note  Patient: [name]  Procedure(s) Performed: Procedure(s): RADIOLOGY WITH ANESTHESIA (N/A)  Patient Location: PACU  Anesthesia Type:General  Level of Consciousness: awake  Airway & Oxygen Therapy: Patient Spontanous Breathing and Patient connected to nasal cannula oxygen  Post-op Assessment: Report given to PACU RN, Post -op Vital signs reviewed and stable and Patient moving all extremities X 4  Post vital signs: Reviewed and stable  Complications: No apparent anesthesia complications

## 2013-05-10 NOTE — Progress Notes (Signed)
Lori Downing, PA paged and after returning page Nurse informed her that there were no orders in [facility] for a consent form. Pam proceeded to tell Nurse that patient was supposed to have the procedure last week but it was cancelled, and there was a consent form. Nurse then told Pam that there was a consent form on the chart, however there was not a actual consent form ordered in [facility], nor was there a reason on the consent form. Pam stated she would take care of it herself.

## 2013-05-11 LAB — CBC WITH DIFFERENTIAL/PLATELET
Eosinophils Relative: 4 % (ref 0–5)
HCT: 35.3 % — ABNORMAL LOW (ref 36.0–46.0)
Hemoglobin: 11.5 g/dL — ABNORMAL LOW (ref 12.0–15.0)
Lymphocytes Relative: 18 % (ref 12–46)
Lymphs Abs: 1.5 10*3/uL (ref 0.7–4.0)
MCH: 33.2 pg (ref 26.0–34.0)
MCV: 102 fL — ABNORMAL HIGH (ref 78.0–100.0)
Monocytes Absolute: 0.6 10*3/uL (ref 0.1–1.0)
RBC: 3.46 MIL/uL — ABNORMAL LOW (ref 3.87–5.11)
RDW: 13.5 % (ref 11.5–15.5)
WBC: 8.4 10*3/uL (ref 4.0–10.5)

## 2013-05-11 LAB — BASIC METABOLIC PANEL
BUN: 4 mg/dL — ABNORMAL LOW (ref 6–23)
CO2: 26 mEq/L (ref 19–32)
Calcium: 7.9 mg/dL — ABNORMAL LOW (ref 8.4–10.5)
Chloride: 110 mEq/L (ref 96–112)
Glucose, Bld: 93 mg/dL (ref 70–99)
Potassium: 3.4 mEq/L — ABNORMAL LOW (ref 3.5–5.1)
Sodium: 141 mEq/L (ref 135–145)

## 2013-05-11 LAB — HEPARIN LEVEL (UNFRACTIONATED): Heparin Unfractionated: <0.1 IU/mL — ABNORMAL LOW (ref 0.30–0.70)

## 2013-05-11 MED ORDER — SODIUM CHLORIDE 0.9 % IV BOLUS (SEPSIS)
250.0000 mL | Freq: Once | INTRAVENOUS | Status: AC
  Administered 2013-05-11: 250 mL via INTRAVENOUS

## 2013-05-11 MED ORDER — SODIUM CHLORIDE 0.9 % IV SOLN
INTRAVENOUS | Status: DC
  Administered 2013-05-11: 08:00:00 via INTRAVENOUS

## 2013-05-11 NOTE — Discharge Summary (Signed)
Physician Discharge Summary  Patient ID: [name] MRN: [id number] DOB/AGE: [birth year] 57 y.o.  Admit date: 05/10/2013 Discharge date: 05/11/2013  Admission Diagnoses: Basilar Artery Aneurysm  Discharge Diagnoses: placement of LVIS stent over basilar artery aneurysm  Active Problems: HLD; Bipolar; Depression; Migraines; Peripheral neuropathy; COPD/smoker; IBS; fibromyalgia; chronic low back pain; Tremor  Discharged Condition: improved  Hospital Course: Pt with hx of multiple intracranial aneurysms. 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 Recent 03/2013 angiogram shows good obliteration of all aneurysms except no change in size of basilar aneurysm. New LVIS Jr stent available to neuro intervention. New stent was placed In IR with Dr Corliss Skains 05/10/13.  Pt tolerated procedure well. Overnight stay was uneventful. Did have few occasions of low blood pressure that responded to bolus of saline.  Stable now.   Pt slept well overnight. Eating well- No N/V. Has urinated on own. Ambulating well unassisted.  Labs today are stable Dr Antony Odea has seen and examined pt. Plan for dc today. Continue all home meds. Dr Corliss Skains has asked pt to review meds with Neurologist or psychiatrist.  Be sure all night time meds are necessary.  May be contributing to facial and hand tremor.    Consults: None  Significant Diagnostic Studies: Cerebral arteriogram  Treatments: LVIS jr stent placed Basilar artery aneurysm  Discharge Exam: Blood pressure 134/59, pulse 64, temperature 98.2 F (36.8 C), temperature source Oral, resp. rate 17, height 5\' 5"  (1.651 m), weight 152 lb 8.9 oz (69.2 kg), SpO2 92.00%.  PE: A/O; EOM Face symmetrical; sl tremor at neck and B hands Smile =; puffs cheeks = Tongue midline appropriate Heart: RRR w/o M Lungs: CTA Abd: soft; +BS; NT Extr: FROM Rt groin: NT; no bleeding; NT; no hematoma Rt foot 2+ pulses  Results for orders  placed during the hospital encounter of 05/10/13  PLATELET INHIBITION P2Y12      Result Value Range   Platelet Function  P2Y12 171 (*) 194 - 418 PRU  HEPARIN LEVEL (UNFRACTIONATED)      Result Value Range   Heparin Unfractionated <0.10 (*) 0.30 - 0.70 IU/mL  HEPARIN LEVEL (UNFRACTIONATED)      Result Value Range   Heparin Unfractionated <0.10 (*) 0.30 - 0.70 IU/mL  BASIC METABOLIC PANEL      Result Value Range   Sodium 141  135 - 145 mEq/L   Potassium 3.4 (*) 3.5 - 5.1 mEq/L   Chloride 110  96 - 112 mEq/L   CO2 26  19 - 32 mEq/L   Glucose, Bld 93  70 - 99 mg/dL   BUN 4 (*) 6 - 23 mg/dL   Creatinine, Ser 7.82  0.50 - 1.10 mg/dL   Calcium 7.9 (*) 8.4 - 10.5 mg/dL   GFR calc non Af Amer >90  >90 mL/min   GFR calc Af Amer >90  >90 mL/min  CBC WITH DIFFERENTIAL      Result Value Range   WBC 8.4  4.0 - 10.5 K/uL   RBC 3.46 (*) 3.87 - 5.11 MIL/uL   Hemoglobin 11.5 (*) 12.0 - 15.0 g/dL   HCT 95.6 (*) 21.3 - 08.6 %   MCV 102.0 (*) 78.0 - 100.0 fL   MCH 33.2  26.0 - 34.0 pg   MCHC 32.6  30.0 - 36.0 g/dL   RDW 57.8  46.9 - 62.9 %   Platelets 182  150 - 400 K/uL   Neutrophils Relative % 71  43 - 77 %  Neutro Abs 5.9  1.7 - 7.7 K/uL   Lymphocytes Relative 18  12 - 46 %   Lymphs Abs 1.5  0.7 - 4.0 K/uL   Monocytes Relative 7  3 - 12 %   Monocytes Absolute 0.6  0.1 - 1.0 K/uL   Eosinophils Relative 4  0 - 5 %   Eosinophils Absolute 0.3  0.0 - 0.7 K/uL   Basophils Relative 0  0 - 1 %   Basophils Absolute 0.0  0.0 - 0.1 K/uL     Disposition: Basilar artery aneurysm LVIS Jr stent placed 05/10/13 with Dr Corliss Skains Pt has done well overnight Plan for dc today. Continue all home meds: ASA 81mg  and Plavix 75 mg daily. Return to see Dr Corliss Skains for 2 week f/u 12/8 at 100pm Plan for re angio in 3 months Pt has good understanding of all plans. Has been seen and examined by Dr Corliss Skains.  Discharge Orders   Future Appointments Provider Department Dept Phone   05/24/2013 1:00 PM [facility] INTERVENTIONAL RADIOLOGY [phone]   Future Orders Complete By Expires   IR Radiologist Eval & Mgmt  06/10/2013 07/11/2014   Questions:     Is the patient pregnant?:  No   Preferred Imaging Location?:  [facility]   Reason for Exam (SYMPTOM  OR DIAGNOSIS REQUIRED):  2 week f/u with Dr Corliss Skains- post basilar artery aneurysm stent   Call MD for:  difficulty breathing, headache or visual disturbances  As directed    Call MD for:  extreme fatigue  As directed    Call MD for:  hives  As directed    Call MD for:  persistant dizziness or light-headedness  As directed    Call MD for:  persistant nausea and vomiting  As directed    Call MD for:  redness, tenderness, or signs of infection (pain, swelling, redness, odor or green/yellow discharge around incision site)  As directed    Call MD for:  severe uncontrolled pain  As directed    Call MD for:  temperature >100.4  As directed    Diet - low sodium heart healthy  As directed    Discharge instructions  As directed    Comments:     To see Dr Corliss Skains for 2 week f/u- 12/8 Mon at 100pm (arrive 1245pm); call [phone] if questions   Discharge wound care:  As directed    Comments:     May change bandage at groin tomorrow to band aid; change daily x 1 week   Driving Restrictions  As directed    Comments:     No driving x 2 weeks   Increase activity slowly  As directed    Comments:     May shower tomorrow   Lifting restrictions  As directed    Comments:     No lifting over 10 lbs x 2 weeks       Medication List         aspirin 81 MG EC tablet  Take 81 mg by mouth daily.     atorvastatin 40 MG tablet  Commonly known as:  LIPITOR  Take 40 mg by mouth daily.     benztropine 1 MG tablet  Commonly known as:  COGENTIN  Take 1 mg by mouth 2 (two) times daily.     clopidogrel 75 MG tablet  Commonly known as:  PLAVIX  Take 75 mg by mouth daily with breakfast.  cyanocobalamin 1000 MCG tablet   Take 1,000 mcg by mouth daily.     DULoxetine 20 MG capsule  Commonly known as:  CYMBALTA  Take 60 mg by mouth daily.     gabapentin 300 MG capsule  Commonly known as:  NEURONTIN  Take 900 mg by mouth at bedtime.     LATUDA 120 MG Tabs  Generic drug:  Lurasidone HCl  Take 120 mg by mouth at bedtime.     lithium 300 MG tablet  Take 300-600 mg by mouth 2 (two) times daily. Takes 300 mg in the morning and 600 mg at bedtime     omeprazole 40 MG capsule  Commonly known as:  PRILOSEC  Take 40 mg by mouth daily.     oxyCODONE 15 MG immediate release tablet  Commonly known as:  ROXICODONE  Take 15 mg by mouth 4 (four) times daily as needed for pain.     potassium chloride 10 MEQ tablet  Commonly known as:  K-DUR,KLOR-CON  Take 10 mEq by mouth daily.     primidone 50 MG tablet  Commonly known as:  MYSOLINE  Take 50-100 mg by mouth 2 (two) times daily. Take 1 tablet in morning and take 2 tablets at bedtime     risperiDONE 2 MG tablet  Commonly known as:  RISPERDAL  Take 4 mg by mouth at bedtime.     Vitamin D3 1000 UNITS Caps  Take 1,000 Units by mouth daily.     vitamin E 400 UNIT capsule  Take 400 Units by mouth daily.         Signed: Fawna Cranmer A 05/11/2013, 11:33 AM

## 2013-05-11 NOTE — Progress Notes (Signed)
Dr. Corliss Skains notified of persistent blood pressure below specified range. BP remaining in the 90's systolic. Order for 250cc bolus and to increase maintenance fluids to 80cc/hr. Orders carried out. Will continue to monitor.

## 2013-05-11 NOTE — Progress Notes (Addendum)
Pt being discharge home. Removed patients IV's. Pt complains of no pain or discomfort. Reviewed discharge instructions and answered any questions. Pt verbalized understanding. Care plan and education was complete. E-link notified.

## 2013-05-11 NOTE — Progress Notes (Signed)
1 Day Post-Op  Subjective: Basilar artery aneurysm LVIS stent placed 11/24 with Dr Corliss Skains BP stable at 100-110s No complaints; no headache   Objective: Vital signs in last 24 hours: Temp:  [97.6 F (36.4 C)-98.9 F (37.2 C)] 98.2 F (36.8 C) (11/25 0800) Pulse Rate:  [65-80] 65 (11/25 0924) Resp:  [9-21] 18 (11/25 0924) BP: (81-116)/(32-58) 116/56 mmHg (11/25 0924) SpO2:  [93 %-99 %] 94 % (11/25 0924) Arterial Line BP: (85-130)/(41-64) 105/53 mmHg (11/25 0924) Weight:  [152 lb 8.9 oz (69.2 kg)-152 lb 9 oz (69.202 kg)] 152 lb 8.9 oz (69.2 kg) (11/24 1430) Last BM Date: 05/09/13 (Pt reports prior to admission)  Intake/Output from previous day: 11/24 0701 - 11/25 0700 In: 2165 [P.O.:200; I.V.:1475; IV Piggyback:250] Out: 3025 [Urine:2950; Blood:75] Intake/Output this shift: Total I/O In: 710 [P.O.:510; I.V.:200] Out: 550 [Urine:550]  PE: afeb; vss Up in bed A/O; EOM Face symm; smile= Puffs cheeks = Rt groin NT; soft; No bleeding No hematoma Rt foot 2+ pulses  Lab Results:   Recent Labs  05/11/13 0405  WBC 8.4  HGB 11.5*  HCT 35.3*  PLT 182   BMET  Recent Labs  05/11/13 0405  NA 141  K 3.4*  CL 110  CO2 26  GLUCOSE 93  BUN 4*  CREATININE 0.74  CALCIUM 7.9*   PT/INR No results found for this basename: LABPROT, INR,  in the last 72 hours ABG No results found for this basename: PHART, PCO2, PO2, HCO3,  in the last 72 hours  Studies/Results: No results found.  Anti-infectives: Anti-infectives   Start     Dose/Rate Route Frequency Ordered Stop   05/10/13 0630  ceFAZolin (ANCEF) IVPB 2 g/50 mL premix     2 g 100 mL/hr over 30 Minutes Intravenous  Once 05/10/13 1610 05/10/13 0925      Assessment/Plan: s/p Procedure(s): RADIOLOGY WITH ANESTHESIA (N/A)  Basilar artery aneurysm LVIS stent placed 11/24 Has done well overnight DC art line; DC foley Increase diet; ambulate Plan for dc today Dr Corliss Skains to see pt before dc   LOS: 1 day     [name] A 05/11/2013

## 2013-05-11 NOTE — Progress Notes (Signed)
The patient walked around the unit twice, voided independently, ate lunch, and sat up in chair prior to discharge per Dr. Fatima Sanger request. Pt ready for discharge when ride gets here.

## 2013-05-11 NOTE — Progress Notes (Signed)
Dr. Corliss Skains notified of blood pressure continuing to trend down. Systolic currently in the 80's. Order for 250cc bolus and to increase maintenance fluids to 100cc/hr. Orders carried out. Will continue to monitor.

## 2013-05-12 ENCOUNTER — Encounter (HOSPITAL_COMMUNITY): Payer: Self-pay | Admitting: Interventional Radiology

## 2013-05-19 ENCOUNTER — Telehealth: Payer: Self-pay | Admitting: Internal Medicine

## 2013-05-19 NOTE — Telephone Encounter (Signed)
Triage completed by Charlene Brooke, RN  Patient Information: Caller Name: Meher Phone: [phone] Patient: [name], [name] Gender: Female DOB: 04/30/1956 Age: 57 Years PCP: Oliver Barre (Adults only) Pregnant: No  Office Follow Up: Does the office need to follow up with this patient?: No Instructions For The Office: N/A  RN Note: Triage Nurse in office made aware of ED Disp. She also requests that I advise patient to mention her appointment in office on 12/4 to the ED staff so that if patient is admitted the hospital staff may call office to cancel her appointment. Patient made aware to proceed now to ED with another adult driving.  Symptoms Reason For Call & Symptoms: "Jerking" episodes x 6 months that have not been evaluated. Patient fell 2 weeks ago as a result of a jerking episode impacting her left rib cage on the washing machine. Patient reports since her fall she has had pain over left rib cage that has now worsened. Patient reports "it hurts" to breathe. PMH, Medications, Allergies, and Surgical History reviewed with patient over the phone. Reviewed Health History In EMR: No Reviewed Medications In EMR: No Reviewed Allergies In EMR: No Reviewed Surgeries / Procedures: No Date of Onset of Symptoms: Unknown OB / GYN: LMP: Unknown  Guideline(s) Used: Breathing Difficulty  Disposition Per Guideline:   Go to ED Now  Reason For Disposition Reached:   Major surgery in the past month  Advice Given: Call Back If: Severe difficulty breathing occurs Fever more than 100.5 F (38.1 C) You become worse.  Patient Will Follow Care Advice: YES

## 2013-05-20 ENCOUNTER — Encounter: Payer: Self-pay | Admitting: Internal Medicine

## 2013-05-20 ENCOUNTER — Encounter (HOSPITAL_COMMUNITY): Payer: Self-pay | Admitting: Emergency Medicine

## 2013-05-20 ENCOUNTER — Ambulatory Visit (INDEPENDENT_AMBULATORY_CARE_PROVIDER_SITE_OTHER): Payer: Medicare Other | Admitting: Internal Medicine

## 2013-05-20 ENCOUNTER — Emergency Department (HOSPITAL_COMMUNITY)
Admission: EM | Admit: 2013-05-20 | Discharge: 2013-05-20 | Discharge status: Against Medical Advice | Payer: Medicare Other | Attending: Emergency Medicine | Admitting: Emergency Medicine

## 2013-05-20 VITALS — BP 84/62 | HR 109 | Temp 98.2°F | Ht 65.0 in | Wt 152.4 lb

## 2013-05-20 DIAGNOSIS — Z9181 History of falling: Secondary | ICD-10-CM

## 2013-05-20 DIAGNOSIS — I959 Hypotension, unspecified: Secondary | ICD-10-CM | POA: Insufficient documentation

## 2013-05-20 DIAGNOSIS — R296 Repeated falls: Secondary | ICD-10-CM | POA: Insufficient documentation

## 2013-05-20 DIAGNOSIS — J449 Chronic obstructive pulmonary disease, unspecified: Secondary | ICD-10-CM | POA: Insufficient documentation

## 2013-05-20 DIAGNOSIS — R079 Chest pain, unspecified: Secondary | ICD-10-CM | POA: Insufficient documentation

## 2013-05-20 DIAGNOSIS — G8911 Acute pain due to trauma: Secondary | ICD-10-CM | POA: Insufficient documentation

## 2013-05-20 DIAGNOSIS — F17200 Nicotine dependence, unspecified, uncomplicated: Secondary | ICD-10-CM | POA: Insufficient documentation

## 2013-05-20 DIAGNOSIS — J4489 Other specified chronic obstructive pulmonary disease: Secondary | ICD-10-CM | POA: Insufficient documentation

## 2013-05-20 NOTE — Patient Instructions (Signed)
Due to your unsteadiness, low blood pressure, fast heart rate and recurring falls, you should go to ER now for further evaluation and treatment, likely to include IV fluids  Please continue all other medications as before

## 2013-05-20 NOTE — Progress Notes (Signed)
Subjective:    Patient ID: [name], female    DOB: Aug 08, 1955, 57 y.o.   MRN: [id number]  HPI  Here to f/u actutely after fall x 4 today, son drove her here, has recurring dizziness and falls, has frank syncope per pt with some of them, not witnessed today, has had recurring episodes of same for 6 mo, but now particularly bad today, she wonders about siezures.  Did have noted lower Bp last wk with her vascular procedure.  Not on vasoactive meds.  States she drinks fluids ok. No fever, n/v, pain, except passed out today with a fall striking the bridge of the nose with abrasion and swelling.  Fell 2 wks ago one time against the washer with now persistent bruising/pain near left anterior costal margin.  Denies urinary symptoms such as dysuria, frequency, urgency, flank pain, hematuria or n/v, fever, chills. Past Medical History  Diagnosis Date  . Depression     bipolar  . OCD (obsessive compulsive disorder)     anxiety  . Migraines   . Brain aneurysm   . Chronic abdominal pain   . History of colonic polyps   . Peripheral neuropathy   . Fibromyalgia   . HLD (hyperlipidemia)   . GERD (gastroesophageal reflux disease)   . IBS (irritable bowel syndrome)     chronic constipation  . Allergic rhinitis   . Gastroparesis   . COPD (chronic obstructive pulmonary disease)     denies  . H/O hiatal hernia   . Arthritis    Past Surgical History  Procedure Laterality Date  . Appendectomy    . Cholecystectomy    . Vesicovaginal fistula closure w/ tah    . Oophorectomy    . Coil to brain aneurysm    . Shoulder surgery Left   . Elbow surgery Bilateral     tendonitis  . Abdominal hysterectomy    . Cervical disc surgery    . Colonoscopy    . Tubal ligation    . Radiology with anesthesia N/A 05/10/2013    Procedure: RADIOLOGY WITH ANESTHESIA;  Surgeon: Oneal Grout, MD;  Location: [facility] OR;  Service: Radiology;  Laterality: N/A;    reports that she has been smoking Cigarettes.  She  has a 39 pack-year smoking history. She has never used smokeless tobacco. She reports that she does not drink alcohol or use illicit drugs. family history includes Cancer in her father, mother, sister, and another family member; Coronary artery disease in an other family member; Diabetes in an other family member; Hypertension in an other family member; Leukemia in her sister; Seizures in an other family member. There is no history of Colon cancer. Allergies  Allergen Reactions  . Hydrocodone Nausea Only    Can take it if its in a cough syrup   . Nsaids     REACTION: GI irritation  . Propoxyphene-Acetaminophen Itching  . Tramadol     REACTION: nausea  . Varenicline Tartrate     REACTION: agitation, depression, sick to stomach  . Chantix [Varenicline] Other (See Comments)    "Makes bipolar worse"  . Other Other (See Comments)    Lactose intolerant   Current Outpatient Prescriptions on File Prior to Visit  Medication Sig Dispense Refill  . aspirin 81 MG EC tablet Take 81 mg by mouth daily.        Marland Kitchen atorvastatin (LIPITOR) 40 MG tablet Take 40 mg by mouth daily.      . benztropine (COGENTIN) 1  MG tablet Take 1 mg by mouth 2 (two) times daily.      . Cholecalciferol (VITAMIN D3) 1000 UNITS CAPS Take 1,000 Units by mouth daily.       . clopidogrel (PLAVIX) 75 MG tablet Take 75 mg by mouth daily with breakfast.      . cyanocobalamin 1000 MCG tablet Take 1,000 mcg by mouth daily.       . DULoxetine (CYMBALTA) 20 MG capsule Take 60 mg by mouth daily.       Marland Kitchen gabapentin (NEURONTIN) 300 MG capsule Take 900 mg by mouth at bedtime.       Marland Kitchen lithium 300 MG tablet Take 300-600 mg by mouth 2 (two) times daily. Takes 300 mg in the morning and 600 mg at bedtime      . Lurasidone HCl (LATUDA) 120 MG TABS Take 120 mg by mouth at bedtime.      Marland Kitchen omeprazole (PRILOSEC) 40 MG capsule Take 40 mg by mouth daily.      Marland Kitchen oxyCODONE (ROXICODONE) 15 MG immediate release tablet Take 15 mg by mouth 4 (four) times daily  as needed for pain.      . potassium chloride (K-DUR,KLOR-CON) 10 MEQ tablet Take 10 mEq by mouth daily.      . primidone (MYSOLINE) 50 MG tablet Take 50-100 mg by mouth 2 (two) times daily. Take 1 tablet in morning and take 2 tablets at bedtime      . risperiDONE (RISPERDAL) 2 MG tablet Take 4 mg by mouth at bedtime.       . vitamin E 400 UNIT capsule Take 400 Units by mouth daily.       No current facility-administered medications on file prior to visit.   Review of Systems  Constitutional: Negative for unexpected weight change, or unusual diaphoresis  HENT: Negative for tinnitus.   Eyes: Negative for photophobia and visual disturbance.  Respiratory: Negative for choking and stridor.   Gastrointestinal: Negative for vomiting and blood in stool.  Genitourinary: Negative for hematuria and decreased urine volume.  Musculoskeletal: Negative for acute joint swelling Skin: Negative for color change and wound.  Neurological: Negative for tremors and numbness other than noted  Psychiatric/Behavioral: Negative for decreased concentration or  hyperactivity.       Objective:   Physical Exam BP 84/62  Pulse 109  Temp(Src) 98.2 F (36.8 C) (Oral)  Ht 5\' 5"  (1.651 m)  Wt 152 lb 6 oz (69.117 kg)  BMI 25.36 kg/m2  SpO2 98% VS noted,  Constitutional: Pt appears well-developed and well-nourished.  HENT: Head: NCAT.  Right Ear: External ear normal.  Left Ear: External ear normal.  Eyes: Conjunctivae and EOM are normal. Pupils are equal, round, and reactive to light.  Neck: Normal range of motion. Neck supple.  Cardiovascular: Normal rate and regular rhythm.   Pulmonary/Chest: Effort normal and breath sounds normal.  Abd:  Soft, NT, non-distended, + BS Neurological: Pt is alert. Not confused , motor 5/5 Skin: Skin is warm. No erythema. Abrasion to bridge of nose with nasal swelling, several bruises and tender LUQ/left costal margin Psychiatric: Pt behavior is normal. Thought content normal.       Assessment & Plan:

## 2013-05-20 NOTE — Progress Notes (Signed)
Pre-visit discussion using our clinic review tool. No additional management support is needed unless otherwise documented below in the visit note.  

## 2013-05-20 NOTE — ED Notes (Signed)
Pt reports being seen by her PCP and referred to ED for hypotension and recurrent falls. Pt reports that she has been falling for over seven months now. PCP recorded a BP of 84/62, however in triage this has improved to 99/58. Pt is A/O x4. Pt reports 7/10 left sided rib cage pain, which has been present since falling into her washing machine two weeks ago.

## 2013-05-20 NOTE — Assessment & Plan Note (Addendum)
With tachycardia, suspect volume depletion, needs to go to ER for fluids, and due to mult falls today should also have eval including labs, though labs last wk noted; has no fever, no bleeding, no CP, but cant r/o siezure as well.  May need facial films  - r/o nasal fx, and prob cxr with left CP.

## 2013-05-20 NOTE — ED Notes (Signed)
Pt alert, nad, resp even unlabored, skin pwd, pt states "i am gonna go home", pt ambulates to exit

## 2013-05-20 NOTE — Assessment & Plan Note (Signed)
See discussion regarding Low blood pressure

## 2013-05-21 ENCOUNTER — Emergency Department (HOSPITAL_COMMUNITY)
Admission: EM | Admit: 2013-05-21 | Discharge: 2013-05-21 | Disposition: A | Discharge status: Home / Self Care | Payer: Medicare Other | Attending: Emergency Medicine | Admitting: Emergency Medicine

## 2013-05-21 ENCOUNTER — Emergency Department (HOSPITAL_COMMUNITY): Payer: Medicare Other

## 2013-05-21 ENCOUNTER — Encounter (HOSPITAL_COMMUNITY): Payer: Self-pay | Admitting: Emergency Medicine

## 2013-05-21 DIAGNOSIS — F17200 Nicotine dependence, unspecified, uncomplicated: Secondary | ICD-10-CM | POA: Insufficient documentation

## 2013-05-21 DIAGNOSIS — Z9889 Other specified postprocedural states: Secondary | ICD-10-CM | POA: Insufficient documentation

## 2013-05-21 DIAGNOSIS — Z79899 Other long term (current) drug therapy: Secondary | ICD-10-CM | POA: Insufficient documentation

## 2013-05-21 DIAGNOSIS — M6281 Muscle weakness (generalized): Secondary | ICD-10-CM | POA: Insufficient documentation

## 2013-05-21 DIAGNOSIS — E785 Hyperlipidemia, unspecified: Secondary | ICD-10-CM | POA: Insufficient documentation

## 2013-05-21 DIAGNOSIS — J449 Chronic obstructive pulmonary disease, unspecified: Secondary | ICD-10-CM | POA: Insufficient documentation

## 2013-05-21 DIAGNOSIS — S2239XA Fracture of one rib, unspecified side, initial encounter for closed fracture: Secondary | ICD-10-CM | POA: Insufficient documentation

## 2013-05-21 DIAGNOSIS — Z9181 History of falling: Secondary | ICD-10-CM | POA: Insufficient documentation

## 2013-05-21 DIAGNOSIS — Z7982 Long term (current) use of aspirin: Secondary | ICD-10-CM | POA: Insufficient documentation

## 2013-05-21 DIAGNOSIS — K219 Gastro-esophageal reflux disease without esophagitis: Secondary | ICD-10-CM | POA: Insufficient documentation

## 2013-05-21 DIAGNOSIS — Z860100 Personal history of colon polyps, unspecified: Secondary | ICD-10-CM | POA: Insufficient documentation

## 2013-05-21 DIAGNOSIS — R296 Repeated falls: Secondary | ICD-10-CM

## 2013-05-21 DIAGNOSIS — G8929 Other chronic pain: Secondary | ICD-10-CM | POA: Insufficient documentation

## 2013-05-21 DIAGNOSIS — Z8669 Personal history of other diseases of the nervous system and sense organs: Secondary | ICD-10-CM | POA: Insufficient documentation

## 2013-05-21 DIAGNOSIS — F429 Obsessive-compulsive disorder, unspecified: Secondary | ICD-10-CM | POA: Insufficient documentation

## 2013-05-21 DIAGNOSIS — Z7902 Long term (current) use of antithrombotics/antiplatelets: Secondary | ICD-10-CM | POA: Insufficient documentation

## 2013-05-21 DIAGNOSIS — S2232XA Fracture of one rib, left side, initial encounter for closed fracture: Secondary | ICD-10-CM

## 2013-05-21 DIAGNOSIS — Z8679 Personal history of other diseases of the circulatory system: Secondary | ICD-10-CM | POA: Insufficient documentation

## 2013-05-21 DIAGNOSIS — Y929 Unspecified place or not applicable: Secondary | ICD-10-CM | POA: Insufficient documentation

## 2013-05-21 DIAGNOSIS — W19XXXA Unspecified fall, initial encounter: Secondary | ICD-10-CM | POA: Insufficient documentation

## 2013-05-21 DIAGNOSIS — J4489 Other specified chronic obstructive pulmonary disease: Secondary | ICD-10-CM | POA: Insufficient documentation

## 2013-05-21 DIAGNOSIS — Y939 Activity, unspecified: Secondary | ICD-10-CM | POA: Insufficient documentation

## 2013-05-21 DIAGNOSIS — M129 Arthropathy, unspecified: Secondary | ICD-10-CM | POA: Insufficient documentation

## 2013-05-21 DIAGNOSIS — G43909 Migraine, unspecified, not intractable, without status migrainosus: Secondary | ICD-10-CM | POA: Insufficient documentation

## 2013-05-21 LAB — BASIC METABOLIC PANEL
Calcium: 8.9 mg/dL (ref 8.4–10.5)
Chloride: 103 mEq/L (ref 96–112)
Creatinine, Ser: 0.85 mg/dL (ref 0.50–1.10)
GFR calc Af Amer: 88 mL/min — ABNORMAL LOW (ref >90)
Sodium: 138 mEq/L (ref 135–145)

## 2013-05-21 LAB — URINE MICROSCOPIC-ADD ON

## 2013-05-21 LAB — CBC WITH DIFFERENTIAL/PLATELET
Basophils Absolute: 0 10*3/uL (ref 0.0–0.1)
Basophils Relative: 0 % (ref 0–1)
Eosinophils Absolute: 0.4 10*3/uL (ref 0.0–0.7)
HCT: 39.6 % (ref 36.0–46.0)
MCH: 33.3 pg (ref 26.0–34.0)
MCHC: 33.6 g/dL (ref 30.0–36.0)
Monocytes Absolute: 0.6 10*3/uL (ref 0.1–1.0)
Neutro Abs: 5.8 10*3/uL (ref 1.7–7.7)
Neutrophils Relative %: 69 % (ref 43–77)
Platelets: 248 10*3/uL (ref 150–400)
RDW: 13.5 % (ref 11.5–15.5)
WBC: 8.3 10*3/uL (ref 4.0–10.5)

## 2013-05-21 LAB — URINALYSIS, ROUTINE W REFLEX MICROSCOPIC
Glucose, UA: NEGATIVE mg/dL
Ketones, ur: NEGATIVE mg/dL
Leukocytes, UA: NEGATIVE
Nitrite: NEGATIVE
Protein, ur: NEGATIVE mg/dL
pH: 6.5 (ref 5.0–8.0)

## 2013-05-21 MED ORDER — SODIUM CHLORIDE 0.9 % IV BOLUS (SEPSIS)
1000.0000 mL | Freq: Once | INTRAVENOUS | Status: AC
  Administered 2013-05-21: 1000 mL via INTRAVENOUS

## 2013-05-21 MED ORDER — FENTANYL CITRATE 0.05 MG/ML IJ SOLN
50.0000 ug | Freq: Once | INTRAMUSCULAR | Status: AC
  Administered 2013-05-21: 50 ug via INTRAVENOUS
  Filled 2013-05-21: qty 2

## 2013-05-21 MED ORDER — SODIUM CHLORIDE 0.9 % IV SOLN: INTRAVENOUS | Status: DC

## 2013-05-21 MED ORDER — OXYCODONE-ACETAMINOPHEN 5-325 MG PO TABS
1.0000 | ORAL_TABLET | Freq: Once | ORAL | Status: AC
  Administered 2013-05-21: 1 via ORAL
  Filled 2013-05-21: qty 1

## 2013-05-21 NOTE — ED Notes (Signed)
Pt c/o frequent falls x 3 weeks.  C/o injury to nose, L side ribcage, L hip, and L knee from falls.  Pain score 7/10.  Pt was recently discharged from [facility] ICU after having stents placed.  Pt was seen at PCP yesterday and sent here for evaluation of hypotension, dehydration, and recurrent falls.  Pt has neuro follow up on Monday.

## 2013-05-21 NOTE — ED Notes (Signed)
Patient takes 15 mg of oxycodone as needed four times per day.  Patient reports she has had 2 today last dose 1100.

## 2013-05-21 NOTE — ED Provider Notes (Signed)
4:13 PM Patient feels much better at this time.  She still reports some left-sided pain he requests a small dose of pain medicine before she leaves.  Blood pressure this time is 80/53.  Patient and the patient's family is not concerned about his blood pressure pelvis if this is a very normal blood pressure for her.  She's got up to go to the bathroom states no lightheadedness reportedly ambulated without difficulty.  Home with PCP followup.  Urinalysis will be followed up on it if the patient does have a urinary tract infection I will call her in a prescription at [phone]  Lyanne Co, MD 05/21/13 [phone]

## 2013-05-21 NOTE — ED Provider Notes (Signed)
CSN: [id number]     Arrival date & time 05/21/13  1333 History   First MD Initiated Contact with Patient 05/21/13 1423     Chief Complaint  Patient presents with  . Fall   (Consider location/radiation/quality/duration/timing/severity/associated sxs/prior Treatment) HPI Comments: [name] is a 57 y.o. female who complains of pain in the left chest from a fall several weeks ago. She has had frequent falls for the last 6 months. She occasionally has difficulty walking, and sometimes, just to the left while walking. At other times she has weakness in both legs. Both of these problems contribute to falling. She had an intracranial stent placed for aneurysm about 3 weeks ago. She's never had documented seizures, nor had a seizure workup. There's no associated chest pain, dizziness, shortness of breath, or abdominal pain. She's using her usual medications, without relief. She saw her PCP yesterday and at that time her blood pressure was 89 systolic and her heart rate was 109 Her PCP told her to come to the ED, then, but she waited until today, to come. There are no other known modifying factors.   Patient is a 57 y.o. female presenting with fall. The history is provided by the patient.  Fall    Past Medical History  Diagnosis Date  . Depression     bipolar  . OCD (obsessive compulsive disorder)     anxiety  . Migraines   . Brain aneurysm   . Chronic abdominal pain   . History of colonic polyps   . Peripheral neuropathy   . Fibromyalgia   . HLD (hyperlipidemia)   . GERD (gastroesophageal reflux disease)   . IBS (irritable bowel syndrome)     chronic constipation  . Allergic rhinitis   . Gastroparesis   . COPD (chronic obstructive pulmonary disease)     denies  . H/O hiatal hernia   . Arthritis    Past Surgical History  Procedure Laterality Date  . Appendectomy    . Cholecystectomy    . Vesicovaginal fistula closure w/ tah    . Oophorectomy    . Coil to brain aneurysm    .  Shoulder surgery Left   . Elbow surgery Bilateral     tendonitis  . Abdominal hysterectomy    . Cervical disc surgery    . Colonoscopy    . Tubal ligation    . Radiology with anesthesia N/A 05/10/2013    Procedure: RADIOLOGY WITH ANESTHESIA;  Surgeon: Oneal Grout, MD;  Location: [facility] OR;  Service: Radiology;  Laterality: N/A;   Family History  Problem Relation Age of Onset  . Coronary artery disease      family hx  . Cancer      family hx  . Diabetes      DM - family hx  . Hypertension      family hx  . Seizures      family hx  . Colon cancer Neg Hx   . Cancer Father     lung  . Cancer Sister     stomach  . Cancer Mother     brain  . Leukemia Sister    History  Substance Use Topics  . Smoking status: Current Every Day Smoker -- 1.50 packs/day for 26 years    Types: Cigarettes  . Smokeless tobacco: Never Used     Comment: smokes 6 cigarettes/day   . Alcohol Use: No   OB History   Grav Para Term Preterm Abortions TAB SAB  Ect Mult Living                 Review of Systems  All other systems reviewed and are negative.    Allergies  Hydrocodone; Nsaids; Propoxyphene-acetaminophen; Tramadol; Varenicline tartrate; Chantix; and Other  Home Medications   Current Outpatient Rx  Name  Route  Sig  Dispense  Refill  . aspirin 81 MG EC tablet   Oral   Take 81 mg by mouth daily.           Marland Kitchen atorvastatin (LIPITOR) 40 MG tablet   Oral   Take 40 mg by mouth daily.         . benztropine (COGENTIN) 1 MG tablet   Oral   Take 1 mg by mouth 2 (two) times daily.         . Cholecalciferol (VITAMIN D3) 1000 UNITS CAPS   Oral   Take 1,000 Units by mouth daily.          . clopidogrel (PLAVIX) 75 MG tablet   Oral   Take 75 mg by mouth daily with breakfast.         . DULoxetine (CYMBALTA) 20 MG capsule   Oral   Take 60 mg by mouth daily.          Marland Kitchen gabapentin (NEURONTIN) 300 MG capsule   Oral   Take 900 mg by mouth at bedtime.          Marland Kitchen  lithium 300 MG tablet   Oral   Take 300-600 mg by mouth 2 (two) times daily. Takes 300 mg in the morning and 600 mg at bedtime         . Lurasidone HCl (LATUDA) 120 MG TABS   Oral   Take 120 mg by mouth at bedtime.         . naproxen sodium (ANAPROX) 220 MG tablet   Oral   Take 440 mg by mouth 2 (two) times daily with a meal.         . omeprazole (PRILOSEC) 40 MG capsule   Oral   Take 40 mg by mouth daily.         Marland Kitchen oxyCODONE (ROXICODONE) 15 MG immediate release tablet   Oral   Take 15 mg by mouth 4 (four) times daily as needed for pain.         . potassium chloride (K-DUR,KLOR-CON) 10 MEQ tablet   Oral   Take 10 mEq by mouth daily.         . primidone (MYSOLINE) 50 MG tablet   Oral   Take 50-100 mg by mouth 2 (two) times daily. Take 1 tablet in morning and take 2 tablets at bedtime         . risperiDONE (RISPERDAL) 2 MG tablet   Oral   Take 4 mg by mouth at bedtime.          . cyanocobalamin 1000 MCG tablet   Oral   Take 1,000 mcg by mouth daily.          . vitamin E 400 UNIT capsule   Oral   Take 400 Units by mouth daily.          BP 113/59  Pulse 81  Temp(Src) 98 F (36.7 C) (Oral)  Resp 17  SpO2 96% Physical Exam  Nursing note and vitals reviewed. Constitutional: She is oriented to person, place, and time. She appears well-developed.  Appears older than stated age  HENT:  Head: Normocephalic and  atraumatic.  Eyes: Conjunctivae and EOM are normal. Pupils are equal, round, and reactive to light.  Neck: Normal range of motion and phonation normal. Neck supple.  Cardiovascular: Normal rate, regular rhythm and intact distal pulses.   Pulmonary/Chest: Effort normal and breath sounds normal. She exhibits tenderness (Mild diffuse left chest wall tenderness, without crepitation or deformity).  Abdominal: Soft. She exhibits no distension and no mass. There is no tenderness. There is no guarding.  Musculoskeletal: Normal range of motion.   Neurological: She is alert and oriented to person, place, and time. She exhibits normal muscle tone.  Skin: Skin is warm and dry.  Psychiatric: She has a normal mood and affect. Her behavior is normal. Judgment and thought content normal.    ED Course  Procedures (including critical care time)  Medications  0.9 %  sodium chloride infusion (not administered)  sodium chloride 0.9 % bolus 1,000 mL (1,000 mLs Intravenous New Bag/Given 05/21/13 1450)   Patient Vitals for the past 24 hrs:  BP Temp Temp src Pulse Resp SpO2  05/21/13 1351 113/59 mmHg 98 F (36.7 C) Oral 81 17 96 %   4:08 PM Reevaluation with update and discussion. After initial assessment and treatment, an updated evaluation reveals she is comfortable. No further complaints. I told her she had a left rib #5 fracture. She has pain medicine at home that she can use. Jaeley Wiker L     Labs Review Labs Reviewed  BASIC METABOLIC PANEL - Abnormal; Notable for the following:    Glucose, Bld 119 (*)    GFR calc non Af Amer 76 (*)    GFR calc Af Amer 88 (*)    All other components within normal limits  URINE CULTURE  CBC WITH DIFFERENTIAL  URINALYSIS, ROUTINE W REFLEX MICROSCOPIC   Imaging Review Dg Chest 2 View  05/21/2013   CLINICAL DATA:  57 year old female with falls and pain. Initial encounter.  EXAM: CHEST  2 VIEW  COMPARISON:  Left rib series from the same day reported separately. 05/03/2013.  FINDINGS: Small left pleural effusion re- identified. Stable lung volumes. Stable cardiac size and mediastinal contours. Stable spinal vertebrae.  IMPRESSION: Small left pleural effusion. See left rib series reported separately.   Electronically Signed   By: Augusto Gamble M.D.   On: 05/21/2013 15:49   Dg Ribs Unilateral Left  05/21/2013   CLINICAL DATA:  57 year old female with pain. Initial encounter.  EXAM: LEFT RIBS - 2 VIEW  COMPARISON:  05/03/2013.  FINDINGS: Stable lung volumes. Normal cardiac size and mediastinal contours.  Visualized tracheal air column is within normal limits. No pneumothorax. Stable right lung base. New small left pleural effusion. Mildly increased basilar predominant interstitial markings are stable.  Cervical ACDF again noted. Stable distal left clavicle. Right upper quadrant surgical clips. Mildly displaced left 5th lateral rib fracture. Other visible ribs appear intact.  IMPRESSION: 1.  Positive for mildly displaced left lateral 5th rib fracture.  2. Small left pleural effusion.  No pneumothorax.   Electronically Signed   By: Augusto Gamble M.D.   On: 05/21/2013 15:48    EKG Interpretation   None       MDM   1. Rib fracture, left, closed, initial encounter   2. Recurrent falls      Recurrent falls. No serious injuries. She is stable for discharge and outpatient management by her PCP to evaluate and treat the recurrent falls and followup on the right rib fracture.   Nursing Notes Reviewed/ Care Coordinated,  and agree without changes. Applicable Imaging Reviewed.  Interpretation of Laboratory Data incorporated into ED treatment   Plan- care to Dr. Patria Mane at [phone] to follow up on the urinalysis prior to discharge    Flint Melter, MD 05/21/13 [phone]

## 2013-05-23 LAB — URINE CULTURE
Colony Count: NO GROWTH
Culture: NO GROWTH

## 2013-05-24 ENCOUNTER — Ambulatory Visit (HOSPITAL_COMMUNITY)
Admit: 2013-05-24 | Discharge: 2013-05-24 | Disposition: A | Discharge status: Home / Self Care | Payer: Medicare Other | Source: Ambulatory Visit | Attending: Radiology | Admitting: Radiology

## 2013-05-24 DIAGNOSIS — F319 Bipolar disorder, unspecified: Secondary | ICD-10-CM | POA: Insufficient documentation

## 2013-05-24 DIAGNOSIS — Z9181 History of falling: Secondary | ICD-10-CM | POA: Insufficient documentation

## 2013-05-24 DIAGNOSIS — R51 Headache: Secondary | ICD-10-CM | POA: Insufficient documentation

## 2013-05-24 DIAGNOSIS — I671 Cerebral aneurysm, nonruptured: Secondary | ICD-10-CM | POA: Insufficient documentation

## 2013-05-24 DIAGNOSIS — Z7902 Long term (current) use of antithrombotics/antiplatelets: Secondary | ICD-10-CM | POA: Insufficient documentation

## 2013-05-24 DIAGNOSIS — Z9889 Other specified postprocedural states: Secondary | ICD-10-CM | POA: Insufficient documentation

## 2013-05-24 DIAGNOSIS — Z09 Encounter for follow-up examination after completed treatment for conditions other than malignant neoplasm: Secondary | ICD-10-CM | POA: Insufficient documentation

## 2013-06-11 ENCOUNTER — Other Ambulatory Visit (HOSPITAL_COMMUNITY): Payer: Medicare Other

## 2013-07-20 ENCOUNTER — Other Ambulatory Visit: Payer: Self-pay | Admitting: Radiology

## 2013-07-23 ENCOUNTER — Other Ambulatory Visit: Payer: Self-pay | Admitting: Radiology

## 2013-07-23 LAB — PLATELET INHIBITION P2Y12: Platelet Function  P2Y12: 10 [PRU] — ABNORMAL LOW (ref 194–418)

## 2013-07-26 ENCOUNTER — Telehealth: Payer: Self-pay | Admitting: Radiology

## 2013-07-26 ENCOUNTER — Other Ambulatory Visit: Payer: Self-pay | Admitting: Radiology

## 2013-07-26 NOTE — Progress Notes (Signed)
Patient had a recent P2Y12 of 10, these results were discussed with Dr. Estanislado Pandy today. The patient has been called with new medication instruction of decrease aspirin to 81mg  daily and decrease plavix to 75mg  1/2 tablet every other day. She will have a P2Y12 in 7-10 days. She states understanding of this plan.   Tsosie Billing PA-C Interventional Radiology  07/26/2013 1:57 PM]

## 2013-09-02 ENCOUNTER — Encounter: Payer: Medicare Other | Admitting: Internal Medicine

## 2013-09-02 ENCOUNTER — Encounter: Payer: Self-pay | Admitting: Internal Medicine

## 2013-09-02 ENCOUNTER — Ambulatory Visit (INDEPENDENT_AMBULATORY_CARE_PROVIDER_SITE_OTHER): Payer: Medicare Other | Admitting: Internal Medicine

## 2013-09-02 ENCOUNTER — Other Ambulatory Visit (INDEPENDENT_AMBULATORY_CARE_PROVIDER_SITE_OTHER): Payer: Medicare Other

## 2013-09-02 VITALS — BP 92/70 | HR 96 | Temp 99.6°F | Wt 155.4 lb

## 2013-09-02 DIAGNOSIS — K5900 Constipation, unspecified: Secondary | ICD-10-CM

## 2013-09-02 DIAGNOSIS — Z0000 Encounter for general adult medical examination without abnormal findings: Secondary | ICD-10-CM

## 2013-09-02 DIAGNOSIS — R300 Dysuria: Secondary | ICD-10-CM

## 2013-09-02 DIAGNOSIS — G4700 Insomnia, unspecified: Secondary | ICD-10-CM

## 2013-09-02 DIAGNOSIS — R51 Headache: Secondary | ICD-10-CM

## 2013-09-02 DIAGNOSIS — F17200 Nicotine dependence, unspecified, uncomplicated: Secondary | ICD-10-CM

## 2013-09-02 DIAGNOSIS — K5909 Other constipation: Secondary | ICD-10-CM | POA: Insufficient documentation

## 2013-09-02 DIAGNOSIS — R9389 Abnormal findings on diagnostic imaging of other specified body structures: Secondary | ICD-10-CM

## 2013-09-02 DIAGNOSIS — E785 Hyperlipidemia, unspecified: Secondary | ICD-10-CM

## 2013-09-02 DIAGNOSIS — R918 Other nonspecific abnormal finding of lung field: Secondary | ICD-10-CM

## 2013-09-02 DIAGNOSIS — R110 Nausea: Secondary | ICD-10-CM

## 2013-09-02 LAB — CBC WITH DIFFERENTIAL/PLATELET
BASOS PCT: 1.1 % (ref 0.0–3.0)
Basophils Absolute: 0.1 10*3/uL (ref 0.0–0.1)
Eosinophils Absolute: 0.3 10*3/uL (ref 0.0–0.7)
Eosinophils Relative: 4.1 % (ref 0.0–5.0)
HEMATOCRIT: 43.1 % (ref 36.0–46.0)
HEMOGLOBIN: 14.2 g/dL (ref 12.0–15.0)
Lymphocytes Relative: 23.8 % (ref 12.0–46.0)
Lymphs Abs: 1.9 10*3/uL (ref 0.7–4.0)
MCHC: 33 g/dL (ref 30.0–36.0)
MCV: 97.6 fl (ref 78.0–100.0)
MONOS PCT: 9.4 % (ref 3.0–12.0)
Monocytes Absolute: 0.7 10*3/uL (ref 0.1–1.0)
NEUTROS ABS: 4.9 10*3/uL (ref 1.4–7.7)
Neutrophils Relative %: 61.6 % (ref 43.0–77.0)
Platelets: 262 10*3/uL (ref 150.0–400.0)
RBC: 4.42 Mil/uL (ref 3.87–5.11)
RDW: 14.9 % — ABNORMAL HIGH (ref 11.5–14.6)
WBC: 7.9 10*3/uL (ref 4.5–10.5)

## 2013-09-02 LAB — LIPID PANEL
Cholesterol: 159 mg/dL (ref 0–200)
HDL: 41.7 mg/dL (ref >39.00)
LDL CALC: 88 mg/dL (ref 0–99)
Total CHOL/HDL Ratio: 4
Triglycerides: 145 mg/dL (ref 0.0–149.0)
VLDL: 29 mg/dL (ref 0.0–40.0)

## 2013-09-02 LAB — HEPATIC FUNCTION PANEL
ALBUMIN: 3.4 g/dL — AB (ref 3.5–5.2)
ALT: 11 U/L (ref 0–35)
AST: 11 U/L (ref 0–37)
Alkaline Phosphatase: 61 U/L (ref 39–117)
BILIRUBIN TOTAL: 0.3 mg/dL (ref 0.3–1.2)
Bilirubin, Direct: 0 mg/dL (ref 0.0–0.3)
Total Protein: 5.9 g/dL — ABNORMAL LOW (ref 6.0–8.3)

## 2013-09-02 LAB — URINALYSIS, ROUTINE W REFLEX MICROSCOPIC
BILIRUBIN URINE: NEGATIVE
HGB URINE DIPSTICK: NEGATIVE
Ketones, ur: NEGATIVE
Leukocytes, UA: NEGATIVE
Nitrite: NEGATIVE
RBC / HPF: NONE SEEN (ref 0–?)
Specific Gravity, Urine: <=1.005 — AB (ref 1.000–1.030)
TOTAL PROTEIN, URINE-UPE24: NEGATIVE
URINE GLUCOSE: NEGATIVE
Urobilinogen, UA: 0.2 (ref 0.0–1.0)
pH: 5.5 (ref 5.0–8.0)

## 2013-09-02 LAB — BASIC METABOLIC PANEL
BUN: 5 mg/dL — AB (ref 6–23)
CALCIUM: 8.5 mg/dL (ref 8.4–10.5)
CO2: 29 mEq/L (ref 19–32)
Chloride: 101 mEq/L (ref 96–112)
Creatinine, Ser: 0.9 mg/dL (ref 0.4–1.2)
GFR: 66.42 mL/min (ref >60.00)
Glucose, Bld: 103 mg/dL — ABNORMAL HIGH (ref 70–99)
Potassium: 3.9 mEq/L (ref 3.5–5.1)
Sodium: 138 mEq/L (ref 135–145)

## 2013-09-02 LAB — TSH: TSH: 0.83 u[IU]/mL (ref 0.35–5.50)

## 2013-09-02 MED ORDER — PROMETHAZINE HCL 12.5 MG PO TABS: 12.5000 mg | ORAL_TABLET | Freq: Four times a day (QID) | ORAL | Status: DC | PRN

## 2013-09-02 MED ORDER — ZOLPIDEM TARTRATE 10 MG PO TABS: 10.0000 mg | ORAL_TABLET | Freq: Every day | ORAL | Status: DC

## 2013-09-02 MED ORDER — LEVOFLOXACIN 250 MG PO TABS: 250.0000 mg | ORAL_TABLET | Freq: Every day | ORAL | Status: DC

## 2013-09-02 NOTE — Assessment & Plan Note (Signed)
[place] for trial linzess, gave 1 wk sample, to call if needs rx, to f/u with GI as planned

## 2013-09-02 NOTE — Patient Instructions (Signed)
Please take all new medication as prescribed - the linzess at 145 per day; please call if this seems to help Please take all new medication as prescribed - also the antibiotic  Please continue all other medications as before, and refills have been done if requested. [name] and phenergan Please have the pharmacy call with any other refills you may need.  Please continue your efforts at being more active, low cholesterol diet, and weight control. You are otherwise up to date with prevention measures today. Please keep your appointments with your specialists as you have planned - GI  You will be contacted regarding the referral for: CT chest  Please go to the LAB in the Basement (turn left off the elevator) for the tests to be done today You will be contacted by phone if any changes need to be made immediately.  Otherwise, you will receive a letter about your results with an explanation, but please check with MyChart first.  Please return in 6 months, or sooner if needed

## 2013-09-02 NOTE — Assessment & Plan Note (Signed)
With low grade temp, mid low abd tender, dyrusia, and c/o right back pain acute on top of chronic back pain; ok for urine studies, empiric levaquin

## 2013-09-02 NOTE — Progress Notes (Signed)
Subjective:    Patient ID: [name], female    DOB: 09/10/1955, 58 y.o.   MRN: [id number]  HPI  Here for wellness and f/u;  Overall doing ok;  Pt denies CP, worsening SOB, DOE, wheezing, orthopnea, PND, worsening LE edema, palpitations, dizziness or syncope.  Pt denies neurological change such as new headache, facial or extremity weakness.  Pt denies polydipsia, polyuria, or low sugar symptoms. Pt states overall good compliance with treatment and medications, good tolerability, and has been trying to follow lower cholesterol diet.  Pt denies worsening depressive symptoms, suicidal ideation or panic. No fever, night sweats, wt loss, loss of appetite, or other constitutional symptoms.  Pt states good ability with ADL's, has low fall risk, home safety reviewed and adequate, no other significant changes in hearing or vision, and only occasionally active with exercise.  Due for f/u colonscopy yearly for now in June 2015 due to recurrent polyps. C/o 1 mo recurring pain with urination, as well as right flank recurring pain sometimes  Mod to severe, taking pain med but not working well.  No other urinary change like color or other per pt - Denies urinary symptoms such as frequency, urgency, flank pain, hematuria or n/v, fever, chills.  Does have occas constipation as well. Has ongoing nauesa with several days in bed the past mo, asks for phenergan refill, to see GI in f/u for May 7 (dr Myles [facility]); mentions also seen at [facility] in [place] with CXR with pulm nodule, has been long time smoker since 58yo  - > 30 pk yrs.  Pt unaware of low grade temp today  Past Medical History  Diagnosis Date  . Depression     bipolar  . OCD (obsessive compulsive disorder)     anxiety  . Migraines   . Brain aneurysm   . Chronic abdominal pain   . History of colonic polyps   . Peripheral neuropathy   . Fibromyalgia   . HLD (hyperlipidemia)   . GERD (gastroesophageal reflux disease)   . IBS  (irritable bowel syndrome)     chronic constipation  . Allergic rhinitis   . Gastroparesis   . COPD (chronic obstructive pulmonary disease)     denies  . H/O hiatal hernia   . Arthritis    Past Surgical History  Procedure Laterality Date  . Appendectomy    . Cholecystectomy    . Vesicovaginal fistula closure w/ tah    . Oophorectomy    . Coil to brain aneurysm    . Shoulder surgery Left   . Elbow surgery Bilateral     tendonitis  . Abdominal hysterectomy    . Cervical disc surgery    . Colonoscopy    . Tubal ligation    . Radiology with anesthesia N/A 05/10/2013    Procedure: RADIOLOGY WITH ANESTHESIA;  Surgeon: Rob Hickman, MD;  Location: [facility];  Service: Radiology;  Laterality: N/A;    reports that she has been smoking Cigarettes.  She has a 39 pack-year smoking history. She has never used smokeless tobacco. She reports that she does not drink alcohol or use illicit drugs. family history includes Cancer in her father, mother, sister, and another family member; Coronary artery disease in an other family member; Diabetes in an other family member; Hypertension in an other family member; Leukemia in her sister; Seizures in an other family member. There is no history of Colon cancer. Allergies  Allergen Reactions  . Hydrocodone Nausea Only  Can take it if its in a cough syrup   . Nsaids     REACTION: GI irritation  . Propoxyphene N-Acetaminophen Itching  . Tramadol     REACTION: nausea  . Varenicline Tartrate     REACTION: agitation, depression, sick to stomach  . Chantix [Varenicline] Other (See Comments)    "Makes bipolar worse"  . Other Other (See Comments)    Lactose intolerant   Current Outpatient Prescriptions on File Prior to Visit  Medication Sig Dispense Refill  . aspirin 81 MG EC tablet Take 81 mg by mouth daily.        Marland Kitchen atorvastatin (LIPITOR) 40 MG tablet Take 40 mg by mouth daily.      . benztropine (COGENTIN) 1 MG tablet Take 1 mg by mouth 2 (two)  times daily.      . Cholecalciferol (VITAMIN D3) 1000 UNITS CAPS Take 1,000 Units by mouth daily.       . clopidogrel (PLAVIX) 75 MG tablet Take 75 mg by mouth daily with breakfast.      . cyanocobalamin 1000 MCG tablet Take 1,000 mcg by mouth daily.       . DULoxetine (CYMBALTA) 20 MG capsule Take 60 mg by mouth daily.       . Lurasidone HCl (LATUDA) 120 MG TABS Take 120 mg by mouth at bedtime.      Marland Kitchen omeprazole (PRILOSEC) 40 MG capsule Take 40 mg by mouth daily.      Marland Kitchen oxyCODONE (ROXICODONE) 15 MG immediate release tablet Take 15 mg by mouth 4 (four) times daily as needed for pain.      . potassium chloride (K-DUR,KLOR-CON) 10 MEQ tablet Take 10 mEq by mouth daily.      . primidone (MYSOLINE) 50 MG tablet Take 50-100 mg by mouth 2 (two) times daily. Take 1 tablet in morning and take 2 tablets at bedtime      . risperiDONE (RISPERDAL) 2 MG tablet Take 4 mg by mouth at bedtime.       . vitamin E 400 UNIT capsule Take 400 Units by mouth daily.      Marland Kitchen gabapentin (NEURONTIN) 300 MG capsule Take 900 mg by mouth at bedtime.       Marland Kitchen lithium 300 MG tablet Take 300-600 mg by mouth 2 (two) times daily. Takes 300 mg in the morning and 600 mg at bedtime      . naproxen sodium (ANAPROX) 220 MG tablet Take 440 mg by mouth 2 (two) times daily with a meal.       No current facility-administered medications on file prior to visit.       Review of Systems Constitutional: Negative for diaphoresis, activity change, appetite change or unexpected weight change.  HENT: Negative for hearing loss, ear pain, facial swelling, mouth sores and neck stiffness.   Eyes: Negative for pain, redness and visual disturbance.  Respiratory: Negative for shortness of breath and wheezing.   Cardiovascular: Negative for chest pain and palpitations.  Gastrointestinal: Negative for diarrhea, blood in stool, abdominal distention or other pain Genitourinary: Negative for hematuria, flank pain or change in urine volume.    Musculoskeletal: Negative for myalgias and joint swelling.  Skin: Negative for color change and wound.  Neurological: Negative for syncope and numbness. other than noted Hematological: Negative for adenopathy.  Psychiatric/Behavioral: Negative for hallucinations, self-injury, decreased concentration and agitation.      Objective:   Physical Exam BP 92/70  Pulse 96  Temp(Src) 99.6 F (37.6 C) (Oral)  Wt 155 lb 6 oz (70.478 kg)  SpO2 96% VS noted,  Constitutional: Pt is oriented to person, place, and time. Appears well-developed and well-nourished.  Head: Normocephalic and atraumatic.  Right Ear: External ear normal.  Left Ear: External ear normal.  Nose: Nose normal.  Mouth/Throat: Oropharynx is clear and moist.  Eyes: Conjunctivae and EOM are normal. Pupils are equal, round, and reactive to light.  Neck: Normal range of motion. Neck supple. No JVD present. No tracheal deviation present.  Cardiovascular: Normal rate, regular rhythm, normal heart sounds and intact distal pulses.   Pulmonary/Chest: Effort normal and breath sounds normal.  Abdominal: Soft. Bowel sounds are normal. There is mild low mid abd tenderness. No HSM  no guarding, rebound Musculoskeletal: Normal range of motion. Exhibits no edema.  Lymphadenopathy:  Has no cervical adenopathy.  Neurological: Pt is alert and oriented to person, place, and time. Pt has normal reflexes. No cranial nerve deficit.  Skin: Skin is warm and dry. No rash noted.  Psychiatric:  Has  normal mood and affect. Behavior is normal.        Assessment & Plan:

## 2013-09-02 NOTE — Assessment & Plan Note (Signed)

## 2013-09-02 NOTE — Progress Notes (Signed)
Pre visit review using our clinic review tool, if applicable. No additional management support is needed unless otherwise documented below in the visit note. 

## 2013-09-04 LAB — URINE CULTURE
COLONY COUNT: NO GROWTH
ORGANISM ID, BACTERIA: NO GROWTH

## 2013-09-09 ENCOUNTER — Other Ambulatory Visit: Payer: Medicare Other

## 2013-09-10 ENCOUNTER — Other Ambulatory Visit (HOSPITAL_COMMUNITY): Payer: Self-pay | Admitting: Interventional Radiology

## 2013-09-10 ENCOUNTER — Ambulatory Visit (INDEPENDENT_AMBULATORY_CARE_PROVIDER_SITE_OTHER)
Admission: RE | Admit: 2013-09-10 | Discharge: 2013-09-10 | Disposition: A | Discharge status: Home / Self Care | Payer: Medicare Other | Source: Ambulatory Visit | Attending: Internal Medicine | Admitting: Internal Medicine

## 2013-09-10 DIAGNOSIS — R9389 Abnormal findings on diagnostic imaging of other specified body structures: Secondary | ICD-10-CM

## 2013-09-10 DIAGNOSIS — F17200 Nicotine dependence, unspecified, uncomplicated: Secondary | ICD-10-CM

## 2013-09-10 DIAGNOSIS — R918 Other nonspecific abnormal finding of lung field: Secondary | ICD-10-CM

## 2013-09-10 DIAGNOSIS — I729 Aneurysm of unspecified site: Secondary | ICD-10-CM

## 2013-09-13 ENCOUNTER — Other Ambulatory Visit: Payer: Self-pay | Admitting: Radiology

## 2013-09-13 ENCOUNTER — Ambulatory Visit (HOSPITAL_COMMUNITY)
Admission: RE | Admit: 2013-09-13 | Discharge: 2013-09-13 | Disposition: A | Discharge status: Home / Self Care | Payer: Medicare Other | Source: Ambulatory Visit | Attending: Interventional Radiology | Admitting: Interventional Radiology

## 2013-09-13 DIAGNOSIS — I671 Cerebral aneurysm, nonruptured: Secondary | ICD-10-CM | POA: Insufficient documentation

## 2013-09-13 DIAGNOSIS — I729 Aneurysm of unspecified site: Secondary | ICD-10-CM

## 2013-09-13 DIAGNOSIS — Z48812 Encounter for surgical aftercare following surgery on the circulatory system: Secondary | ICD-10-CM | POA: Insufficient documentation

## 2013-09-13 LAB — CREATININE, SERUM: CREATININE: 0.76 mg/dL (ref 0.50–1.10)

## 2013-09-13 LAB — PLATELET INHIBITION P2Y12: Platelet Function  P2Y12: 78 [PRU] — ABNORMAL LOW (ref 194–418)

## 2013-09-13 LAB — BUN: BUN: 4 mg/dL — AB (ref 6–23)

## 2013-09-13 MED ORDER — IOHEXOL 350 MG/ML SOLN
50.0000 mL | Freq: Once | INTRAVENOUS | Status: AC | PRN
Start: 2013-09-13 — End: 2013-09-13
  Administered 2013-09-13: 50 mL via INTRAVENOUS

## 2013-09-14 ENCOUNTER — Telehealth (HOSPITAL_COMMUNITY): Payer: Self-pay | Admitting: Interventional Radiology

## 2013-09-14 NOTE — Telephone Encounter (Signed)
Called pt, left VM for to call me back. JM

## 2013-10-25 ENCOUNTER — Other Ambulatory Visit: Payer: Self-pay | Admitting: Internal Medicine

## 2013-10-26 ENCOUNTER — Other Ambulatory Visit: Payer: Self-pay

## 2013-10-26 MED ORDER — POTASSIUM CHLORIDE CRYS ER 10 MEQ PO TBCR: 10.0000 meq | EXTENDED_RELEASE_TABLET | Freq: Every day | ORAL | Status: DC

## 2013-11-05 ENCOUNTER — Telehealth: Payer: Self-pay | Admitting: Internal Medicine

## 2013-11-05 DIAGNOSIS — R251 Tremor, unspecified: Secondary | ICD-10-CM

## 2013-11-05 MED ORDER — PRIMIDONE 50 MG PO TABS: ORAL_TABLET | ORAL | Status: DC

## 2013-11-05 NOTE — Telephone Encounter (Signed)
I will let her know.  She needs a new referral to [facility].

## 2013-11-05 NOTE — Addendum Note (Signed)
Addended by: Biagio Borg on: 11/05/2013 01:33 PM   Modules accepted: Orders

## 2013-11-05 NOTE — Telephone Encounter (Signed)
Called the patient left a detailed message rx sent in for one month and MD instructions as well.

## 2013-11-05 NOTE — Telephone Encounter (Signed)
I can write for one month, but pt should f/u with neurology as well  - done erx

## 2013-11-05 NOTE — Telephone Encounter (Signed)
Referral done

## 2013-11-05 NOTE — Telephone Encounter (Signed)
Pt needs a new neurology for [facility] in [place].  She can't spell the doctors name.   Pt wants to know if Dr. Jenny Reichmann will write an RX for Primidone, since she hasn't seen that doctor in a year. Pharmacy is [facility]

## 2013-11-10 ENCOUNTER — Encounter: Payer: Medicare Other | Admitting: Internal Medicine

## 2013-11-21 ENCOUNTER — Other Ambulatory Visit: Payer: Self-pay | Admitting: Internal Medicine

## 2013-12-06 ENCOUNTER — Encounter: Payer: Self-pay | Admitting: Internal Medicine

## 2013-12-06 ENCOUNTER — Other Ambulatory Visit: Payer: Self-pay | Admitting: Internal Medicine

## 2013-12-06 ENCOUNTER — Ambulatory Visit (INDEPENDENT_AMBULATORY_CARE_PROVIDER_SITE_OTHER): Payer: Medicare Other | Admitting: Internal Medicine

## 2013-12-06 VITALS — BP 118/68 | HR 90 | Temp 97.0°F | Wt 160.0 lb

## 2013-12-06 DIAGNOSIS — M25552 Pain in left hip: Secondary | ICD-10-CM

## 2013-12-06 DIAGNOSIS — M25559 Pain in unspecified hip: Secondary | ICD-10-CM

## 2013-12-06 MED ORDER — MELOXICAM 7.5 MG PO TABS: 7.5000 mg | ORAL_TABLET | Freq: Every day | ORAL | Status: DC

## 2013-12-06 NOTE — Patient Instructions (Addendum)
Use an anti-inflammatory cream such as Aspercreme or Zostrix cream twice a day to the affected area as needed. In lieu of this warm moist compresses or  hot water bottle can be used. Do not apply ice . If the  symptoms persist or progress despite Meloxicam ; see Dr Marlou Sa.

## 2013-12-06 NOTE — Progress Notes (Signed)
   Subjective:    Patient ID: [name], female    DOB: Nov 09, 1955, 58 y.o.   MRN: [id number]  HPI She's had 3 weeks of pain in the left hip which she describes as "stabbing, burning,"; it can be up to a level VII-10 on a 10 scale. It has minimal radiation. When present it will  last 20-30 seconds.  There was no specific trigger or injury for this pain.  Flexeril ,Naproxen & Aleve not helping. Also she has applied ice for 15 minutes 3-4 times a day over the last 2 weeks  She's had a 5 pound weight GAIN since March.  She is seen in the pain clinic in [place] and is on Oxycodone. She's had multiple intra-articualr injections in the past .  She has increased her cigarette consumption from 6 cig/day to 1-1.5 packs per day in the last 3-4 months    Review of Systems  She denies localized redness, swelling, joint stiffness  No change in color or temperature of skin in this area  She has no fever, chills, sweats  There is no limb numbness, tingling, weakness  She has no urinary or stool incontinence.  She has no abnormal bruising or bleeding.    Objective:   Physical Exam   Positive physical findings included poor dentition (she states she is having dental work at this time). Weathered facies  Very fine,non pill rolling tremor left upper extremity @ rest.  She has diffuse rhonchi.  General appearance:adequate nourishment w/o distress.  Eyes: No conjunctival inflammation or scleral icterus is present.  Oral exam: lips and gums are healthy appearing.There is no oropharyngeal erythema or exudate noted.   Heart:  Normal rate and regular rhythm. S1 and S2 normal without gallop, murmur, click, rub or other extra sounds     Lungs:No increased work of breathing.   Abdomen: bowel sounds normal, soft and non-tender without masses, organomegaly or hernias noted.  No guarding or rebound . No AAA.No flank tenderness.  Musculoskeletal: Able to lie flat and sit up without  help. Negative straight leg raising bilaterally but some back pain with hip ROM. Mild hip discomfort with ROM over femoral head. Some tenderness over the LS spine to light percussion. Gait normal  Skin:Warm & dry.  Intact without suspicious lesions or rashes ; no jaundice or tenting  Lymphatic: No lymphadenopathy is noted about the head, neck, axilla              Assessment & Plan:  #1 Clinically this appears to be localized to the femoral head ( ball-and-socket ) area rather than to the bursa.  #2 active smoker; risk discussed  #3 COPD, discussed  Plan: Topical anti-inflammatory and twice a day meloxicam for 10 days. Orthopedic referral if no better.

## 2013-12-06 NOTE — Progress Notes (Signed)
Pre visit review using our clinic review tool, if applicable. No additional management support is needed unless otherwise documented below in the visit note. 

## 2013-12-15 ENCOUNTER — Other Ambulatory Visit (HOSPITAL_COMMUNITY): Payer: Self-pay | Admitting: Interventional Radiology

## 2013-12-15 DIAGNOSIS — I729 Aneurysm of unspecified site: Secondary | ICD-10-CM

## 2013-12-24 ENCOUNTER — Ambulatory Visit (HOSPITAL_COMMUNITY)
Admission: RE | Admit: 2013-12-24 | Discharge: 2013-12-24 | Disposition: A | Discharge status: Home / Self Care | Payer: Medicare Other | Source: Ambulatory Visit | Attending: Interventional Radiology | Admitting: Interventional Radiology

## 2013-12-24 ENCOUNTER — Other Ambulatory Visit: Payer: Self-pay | Admitting: Radiology

## 2013-12-24 ENCOUNTER — Encounter (HOSPITAL_COMMUNITY): Payer: Self-pay

## 2013-12-24 ENCOUNTER — Telehealth (HOSPITAL_COMMUNITY): Payer: Self-pay | Admitting: Interventional Radiology

## 2013-12-24 DIAGNOSIS — I729 Aneurysm of unspecified site: Secondary | ICD-10-CM

## 2013-12-24 DIAGNOSIS — I671 Cerebral aneurysm, nonruptured: Secondary | ICD-10-CM | POA: Insufficient documentation

## 2013-12-24 LAB — PLATELET INHIBITION P2Y12: Platelet Function  P2Y12: 206 [PRU] (ref 194–418)

## 2013-12-24 MED ORDER — SODIUM CHLORIDE 0.9 % IJ SOLN
INTRAMUSCULAR | Status: AC
  Filled 2013-12-24: qty 50

## 2013-12-24 MED ORDER — IOHEXOL 350 MG/ML SOLN
50.0000 mL | Freq: Once | INTRAVENOUS | Status: AC | PRN
Start: 2013-12-24 — End: 2013-12-24
  Administered 2013-12-24: 50 mL via INTRAVENOUS

## 2013-12-24 NOTE — Telephone Encounter (Signed)
Faxed previous history of patient to her new neurologist per her request. Sent to Dr. Boyd Kerbs, MD [phone] fax, [phone] phone. JM

## 2013-12-27 ENCOUNTER — Other Ambulatory Visit (HOSPITAL_COMMUNITY): Payer: Self-pay | Admitting: Interventional Radiology

## 2013-12-27 DIAGNOSIS — I729 Aneurysm of unspecified site: Secondary | ICD-10-CM

## 2013-12-28 ENCOUNTER — Telehealth (HOSPITAL_COMMUNITY): Payer: Self-pay | Admitting: Interventional Radiology

## 2013-12-28 NOTE — Telephone Encounter (Signed)
Called pt, told her per Deveshwar to stop taking the Plavix 75mg  1/2 tablet q o d and increase her Aspirin from 81mg  1 qd to 325mg  1 qd. Also told her that she would need a CTA head/neck in 6 months time for f/u.  She states understanding and is in agreement with this plan of care. JMichaux

## 2013-12-29 ENCOUNTER — Other Ambulatory Visit: Payer: Self-pay | Admitting: Internal Medicine

## 2013-12-29 ENCOUNTER — Encounter (HOSPITAL_COMMUNITY): Payer: Self-pay

## 2013-12-29 ENCOUNTER — Ambulatory Visit (HOSPITAL_COMMUNITY)
Admission: RE | Admit: 2013-12-29 | Discharge: 2013-12-29 | Disposition: A | Discharge status: Home / Self Care | Payer: Medicare Other | Source: Ambulatory Visit | Attending: Interventional Radiology | Admitting: Interventional Radiology

## 2013-12-29 DIAGNOSIS — I729 Aneurysm of unspecified site: Secondary | ICD-10-CM | POA: Insufficient documentation

## 2013-12-29 LAB — PLATELET INHIBITION P2Y12: PLATELET FUNCTION P2Y12: 180 [PRU] — AB (ref 194–418)

## 2013-12-30 ENCOUNTER — Telehealth: Payer: Self-pay | Admitting: Internal Medicine

## 2013-12-30 NOTE — Telephone Encounter (Signed)
Relevant patient education assigned to patient using Emmi. ° °

## 2014-01-31 ENCOUNTER — Telehealth: Payer: Self-pay | Admitting: Internal Medicine

## 2014-01-31 NOTE — Telephone Encounter (Signed)
Patient Information:  Caller Name: Evalene  Phone: [phone]  Patient: [name], [name]  Gender: Female  DOB: Jul 02, 1955  Age: 58 Years  PCP: Cathlean Cower (Adults only)  Pregnant: No  Office Follow Up:  Does the office need to follow up with this patient?: No  Instructions For The Office: N/A   Symptoms  Reason For Call & Symptoms: c/o intermittent mid chest pain; feels pain and tightness; has pain anytime, but worse with a deep breath; hurts to breath; c/o pain between shoulder blades and left arm; says she has some neck pain which causes that arm to be numb and tingly; denies injury; c/o feeling hot and cold; sweating  Reviewed Health History In EMR: Yes  Reviewed Medications In EMR: Yes  Reviewed Allergies In EMR: Yes  Reviewed Surgeries / Procedures: Yes  Date of Onset of Symptoms: 01/27/2014  Treatments Tried: ASA  Treatments Tried Worked: Yes OB / GYN:  LMP: Unknown  Guideline(s) Used:  Chest Pain  Disposition Per Guideline:   Call EMS 911 Now  Reason For Disposition Reached:   Chest pain lasting longer than 5 minutes and ANY of the following:  Over 53 years old Over 69 years old and at least one cardiac risk factor (i.e., high blood pressure, diabetes, high cholesterol, obesity, smoker or strong family history of heart disease) Pain is crushing, pressure-like, or heavy  Took nitroglycerin and chest pain was not relieved History of heart disease (i.e., angina, heart attack, bypass surgery, angioplasty, CHF)  Advice Given:  N/A  Patient Will Follow Care Advice:  YES

## 2014-02-03 ENCOUNTER — Telehealth: Payer: Self-pay | Admitting: Internal Medicine

## 2014-02-03 ENCOUNTER — Ambulatory Visit (INDEPENDENT_AMBULATORY_CARE_PROVIDER_SITE_OTHER): Payer: Medicare Other | Admitting: Internal Medicine

## 2014-02-03 ENCOUNTER — Encounter: Payer: Self-pay | Admitting: Internal Medicine

## 2014-02-03 VITALS — BP 112/78 | HR 100 | Temp 98.4°F | Wt 162.4 lb

## 2014-02-03 DIAGNOSIS — R1319 Other dysphagia: Secondary | ICD-10-CM

## 2014-02-03 DIAGNOSIS — K2100 Gastro-esophageal reflux disease with esophagitis, without bleeding: Secondary | ICD-10-CM

## 2014-02-03 DIAGNOSIS — J449 Chronic obstructive pulmonary disease, unspecified: Secondary | ICD-10-CM

## 2014-02-03 MED ORDER — PANTOPRAZOLE SODIUM 40 MG PO TBEC: 40.0000 mg | DELAYED_RELEASE_TABLET | Freq: Two times a day (BID) | ORAL | Status: DC

## 2014-02-03 NOTE — Telephone Encounter (Signed)
Pt stated that Dr. Jenny Reichmann told her to come back 6 month but on the paper say 5 wk. Just need to verify which one?

## 2014-02-03 NOTE — Progress Notes (Signed)
Subjective:    Patient ID: [name], female    DOB: 1956-01-12, 58 y.o.   MRN: [id number]  HPI  Here to f/u, Pt denies chest pain, wheezing, orthopnea, PND, increased LE swelling, palpitations, dizziness or syncope, except Seen per [facility] aug 17, with dx esophagitis/cp.  Has appt with GI sept 22 (pt going out of town for 2 wks starting next wk); now taking 80 mg daily omeprazoloe, but not sure working as well.  Also some intermittent sob, known copd, asks for albut MDI since she is out.  Also Feels sore all over chest/absd worse to left shoudler, and upper back as well.  Has left cervical related nerve pinching by EMG/NCS Dr Viann Fish  [facility] neurology. Has ESI to left cervical, helped for 1 wk. Denies worsening ref, n/v, bowel change or blood, but has had occas dysphagia to swallow solids since discomfort above began. Also Seeing pain MD - keeping her on oxycodone 15 qid, and ms contin 15 bid.   Past Medical History  Diagnosis Date  . Depression     bipolar  . OCD (obsessive compulsive disorder)     anxiety  . Migraines   . Brain aneurysm   . Chronic abdominal pain   . History of colonic polyps   . Peripheral neuropathy   . Fibromyalgia   . HLD (hyperlipidemia)   . GERD (gastroesophageal reflux disease)   . IBS (irritable bowel syndrome)     chronic constipation  . Allergic rhinitis   . Gastroparesis   . COPD (chronic obstructive pulmonary disease)     denies  . H/O hiatal hernia   . Arthritis    Past Surgical History  Procedure Laterality Date  . Appendectomy    . Cholecystectomy    . Vesicovaginal fistula closure w/ tah    . Oophorectomy    . Coil to brain aneurysm    . Shoulder surgery Left   . Elbow surgery Bilateral     tendonitis  . Abdominal hysterectomy    . Cervical disc surgery    . Colonoscopy    . Tubal ligation    . Radiology with anesthesia N/A 05/10/2013    Procedure: RADIOLOGY WITH ANESTHESIA;  Surgeon: Rob Hickman, MD;   Location: [facility];  Service: Radiology;  Laterality: N/A;    reports that she has been smoking Cigarettes.  She has a 39 pack-year smoking history. She has never used smokeless tobacco. She reports that she does not drink alcohol or use illicit drugs. family history includes Cancer in her father, mother, sister, and another family member; Coronary artery disease in an other family member; Diabetes in an other family member; Hypertension in an other family member; Leukemia in her sister; Seizures in an other family member. There is no history of Colon cancer. Allergies  Allergen Reactions  . Hydrocodone Nausea Only    Can take it if its in a cough syrup   . Nsaids     REACTION: GI irritation  . Propoxyphene N-Acetaminophen Itching  . Tramadol     REACTION: nausea  . Varenicline Tartrate     REACTION: agitation, depression, sick to stomach  . Chantix [Varenicline] Other (See Comments)    "Makes bipolar worse"  . Other Other (See Comments)    Lactose intolerant   Current Outpatient Prescriptions on File Prior to Visit  Medication Sig Dispense Refill  . aspirin 81 MG EC tablet Take 81 mg by mouth daily.        Marland Kitchen  atorvastatin (LIPITOR) 40 MG tablet TAKE ONE TABLET BY MOUTH EVERY DAY  30 tablet  11  . benztropine (COGENTIN) 1 MG tablet Take 1 mg by mouth 2 (two) times daily.      . Cholecalciferol (VITAMIN D3) 1000 UNITS CAPS Take 1,000 Units by mouth daily.       . cyanocobalamin 1000 MCG tablet Take 1,000 mcg by mouth daily.       . DULoxetine (CYMBALTA) 20 MG capsule Take 60 mg by mouth daily.       . Lurasidone HCl (LATUDA) 120 MG TABS Take 120 mg by mouth at bedtime.      Marland Kitchen oxyCODONE (ROXICODONE) 15 MG immediate release tablet Take 15 mg by mouth 4 (four) times daily as needed for pain.      . potassium chloride (K-DUR) 10 MEQ tablet TAKE ONE TABLET BY MOUTH EVERY DAY  30 tablet  11  . potassium chloride (K-DUR,KLOR-CON) 10 MEQ tablet Take 1 tablet (10 mEq total) by mouth daily.  30  tablet  11  . promethazine (PHENERGAN) 12.5 MG tablet TAKE ONE TABLET BY MOUTH EVERY 6 HOURS AS NEEDED FOR NAUSEA  30 tablet  2  . risperiDONE (RISPERDAL) 2 MG tablet Take 4 mg by mouth at bedtime.       Marland Kitchen zolpidem (AMBIEN) 10 MG tablet Take 1 tablet (10 mg total) by mouth at bedtime.  30 tablet  5   No current facility-administered medications on file prior to visit.   Review of Systems  Constitutional: Negative for unusual diaphoresis or other sweats  HENT: Negative for ringing in ear Eyes: Negative for double vision or worsening visual disturbance.  Respiratory: Negative for choking and stridor.   Gastrointestinal: Negative for vomiting or other signifcant bowel change Genitourinary: Negative for hematuria or decreased urine volume.  Musculoskeletal: Negative for other MSK pain or swelling Skin: Negative for color change and worsening wound.  Neurological: Negative for tremors and numbness other than noted  Psychiatric/Behavioral: Negative for decreased concentration or agitation other than above       Objective:   Physical Exam BP 112/78  Pulse 100  Temp(Src) 98.4 F (36.9 C) (Oral)  Wt 162 lb 6 oz (73.653 kg)  SpO2 96% VS noted,  Constitutional: Pt appears well-developed, well-nourished.  HENT: Head: NCAT.  Right Ear: External ear normal.  Left Ear: External ear normal.  Eyes: . Pupils are equal, round, and reactive to light. Conjunctivae and EOM are normal Neck: Normal range of motion. Neck supple.  Cardiovascular: Normal rate and regular rhythm.   Pulmonary/Chest: Effort normal and breath sounds mild decreased bilat, no rales or wheezing.  Abd:  Soft, NT, ND, + BS Neurological: Pt is alert. Not confused , motor grossly intact Skin: Skin is warm. No rash Psychiatric: Pt behavior is normal. No agitation.     Assessment & Plan:

## 2014-02-03 NOTE — Progress Notes (Signed)
Pre visit review using our clinic review tool, if applicable. No additional management support is needed unless otherwise documented below in the visit note. 

## 2014-02-03 NOTE — Telephone Encounter (Signed)
Pt has ROV in 7 months (per pt request as she left)

## 2014-02-03 NOTE — Patient Instructions (Addendum)
Ok to stop the prilosec (omeprazole)  Please take all new medication as prescribed - the protonix twice per day, and the inhaler only as needed  Please continue all other medications as before, and refills have been done if requested.  Please have the pharmacy call with any other refills you may need.  Please keep your appointments with your specialists as you may have planned - GI on Sept 22

## 2014-02-04 MED ORDER — ALBUTEROL SULFATE HFA 108 (90 BASE) MCG/ACT IN AERS: 2.0000 | INHALATION_SPRAY | Freq: Four times a day (QID) | RESPIRATORY_TRACT | Status: DC | PRN

## 2014-02-04 NOTE — Telephone Encounter (Signed)
Patient informed. 

## 2014-02-04 NOTE — Telephone Encounter (Signed)
Sorry for all the confusion  She is correct, I will send proair hfa prn

## 2014-02-04 NOTE — Telephone Encounter (Signed)
Patient came in on yesterday and says she was supposed to have an inhaler sent to her pharmacy. Please advise.

## 2014-02-06 DIAGNOSIS — R1310 Dysphagia, unspecified: Secondary | ICD-10-CM | POA: Insufficient documentation

## 2014-02-06 NOTE — Assessment & Plan Note (Signed)
stable overall by history and exam, recent data reviewed with pt, and pt to continue medical treatment as before,  to f/u any worsening symptoms or concerns, for albuterol prn SpO2 Readings from Last 3 Encounters:  02/03/14 96%  12/06/13 97%  09/02/13 96%

## 2014-02-06 NOTE — Assessment & Plan Note (Signed)
With clinical esophagitis - for change to protonix 40 bid

## 2014-02-06 NOTE — Assessment & Plan Note (Signed)
Likely related to above, for GI referral if not readily resolved in 1 wk

## 2014-03-08 ENCOUNTER — Ambulatory Visit: Payer: Medicare Other | Admitting: Internal Medicine

## 2014-03-08 DIAGNOSIS — Z0289 Encounter for other administrative examinations: Secondary | ICD-10-CM

## 2014-03-24 ENCOUNTER — Ambulatory Visit: Payer: Medicare Other | Admitting: Internal Medicine

## 2014-03-29 ENCOUNTER — Ambulatory Visit (INDEPENDENT_AMBULATORY_CARE_PROVIDER_SITE_OTHER): Payer: Medicare Other | Admitting: Internal Medicine

## 2014-03-29 ENCOUNTER — Encounter: Payer: Self-pay | Admitting: Internal Medicine

## 2014-03-29 VITALS — BP 112/72 | HR 86 | Temp 97.7°F | Ht 65.0 in | Wt 164.2 lb

## 2014-03-29 DIAGNOSIS — K219 Gastro-esophageal reflux disease without esophagitis: Secondary | ICD-10-CM

## 2014-03-29 DIAGNOSIS — R499 Unspecified voice and resonance disorder: Secondary | ICD-10-CM | POA: Insufficient documentation

## 2014-03-29 DIAGNOSIS — F32A Depression, unspecified: Secondary | ICD-10-CM

## 2014-03-29 DIAGNOSIS — J438 Other emphysema: Secondary | ICD-10-CM

## 2014-03-29 DIAGNOSIS — F329 Major depressive disorder, single episode, unspecified: Secondary | ICD-10-CM

## 2014-03-29 MED ORDER — FAMOTIDINE 20 MG PO TABS: 20.0000 mg | ORAL_TABLET | Freq: Two times a day (BID) | ORAL | Status: DC

## 2014-03-29 NOTE — Patient Instructions (Signed)
Please take all new medication as prescribed - the pepcid 20 mg per day  Please continue all other medications as before, including the omeprazole twice per day  Please have the pharmacy call with any other refills you may need.  Please keep your appointments with your specialists as you may have planned  You will be contacted regarding the referral for: ENT for better exam of the vocal cords

## 2014-03-29 NOTE — Progress Notes (Signed)
Pre visit review using our clinic review tool, if applicable. No additional management support is needed unless otherwise documented below in the visit note. 

## 2014-03-29 NOTE — Progress Notes (Signed)
Subjective:    Patient ID: [name], female    DOB: 1955-09-15, 58 y.o.   MRN: [id number]  HPI  Here to f/u, c/o intermittent hoarseness of voice, mild to mod, for several months, worse in the past month, also with right neck "feels funny."  No HA, fever, sinus symptoms or congestion, ear pain, ST or cough.  Has been long time smoker tob > 1 ppd for 40 yrs.  Also with intermittent daily mild reflux despite omeprazole 20 bid.  Voice rest no help.  No other makes better or worse.  Pt denies chest pain, increased sob or doe, wheezing, orthopnea, PND, increased LE swelling, palpitations, dizziness or syncope.  Pt denies new neurological symptoms such as new headache, or facial or extremity weakness or numbness   Pt denies polydipsia, polyuria. Denies worsening depressive symptoms, suicidal ideation, or panic  Past Medical History  Diagnosis Date  . Depression     bipolar  . OCD (obsessive compulsive disorder)     anxiety  . Migraines   . Brain aneurysm   . Chronic abdominal pain   . History of colonic polyps   . Peripheral neuropathy   . Fibromyalgia   . HLD (hyperlipidemia)   . GERD (gastroesophageal reflux disease)   . IBS (irritable bowel syndrome)     chronic constipation  . Allergic rhinitis   . Gastroparesis   . COPD (chronic obstructive pulmonary disease)     denies  . H/O hiatal hernia   . Arthritis    Past Surgical History  Procedure Laterality Date  . Appendectomy    . Cholecystectomy    . Vesicovaginal fistula closure w/ tah    . Oophorectomy    . Coil to brain aneurysm    . Shoulder surgery Left   . Elbow surgery Bilateral     tendonitis  . Abdominal hysterectomy    . Cervical disc surgery    . Colonoscopy    . Tubal ligation    . Radiology with anesthesia N/A 05/10/2013    Procedure: RADIOLOGY WITH ANESTHESIA;  Surgeon: Rob Hickman, MD;  Location: [facility];  Service: Radiology;  Laterality: N/A;    reports that she has been smoking Cigarettes.  She  has a 39 pack-year smoking history. She has never used smokeless tobacco. She reports that she does not drink alcohol or use illicit drugs. family history includes Cancer in her father, mother, sister, and another family member; Coronary artery disease in an other family member; Diabetes in an other family member; Hypertension in an other family member; Leukemia in her sister; Seizures in an other family member. There is no history of Colon cancer. Allergies  Allergen Reactions  . Hydrocodone Nausea Only    Can take it if its in a cough syrup   . Nsaids     REACTION: GI irritation  . Propoxyphene N-Acetaminophen Itching  . Tramadol     REACTION: nausea  . Varenicline Tartrate     REACTION: agitation, depression, sick to stomach  . Chantix [Varenicline] Other (See Comments)    "Makes bipolar worse"  . Other Other (See Comments)    Lactose intolerant   Current Outpatient Prescriptions on File Prior to Visit  Medication Sig Dispense Refill  . albuterol (PROVENTIL HFA;VENTOLIN HFA) 108 (90 BASE) MCG/ACT inhaler Inhale 2 puffs into the lungs every 6 (six) hours as needed for wheezing or shortness of breath.  1 Inhaler  11  . aspirin 325 MG tablet Take 325  mg by mouth daily.      Marland Kitchen atorvastatin (LIPITOR) 40 MG tablet TAKE ONE TABLET BY MOUTH EVERY DAY  30 tablet  11  . benztropine (COGENTIN) 1 MG tablet Take 1 mg by mouth 2 (two) times daily.      . Cholecalciferol (VITAMIN D3) 1000 UNITS CAPS Take 1,000 Units by mouth daily.       . cyanocobalamin 1000 MCG tablet Take 1,000 mcg by mouth daily.       . DULoxetine (CYMBALTA) 20 MG capsule Take 60 mg by mouth daily.       Marland Kitchen lamoTRIgine (LAMICTAL) 200 MG tablet Take 200 mg by mouth daily.      . Lurasidone HCl (LATUDA) 120 MG TABS Take 120 mg by mouth at bedtime.      Marland Kitchen oxyCODONE (ROXICODONE) 15 MG immediate release tablet Take 15 mg by mouth 4 (four) times daily as needed for pain.      . pantoprazole (PROTONIX) 40 MG tablet Take 1 tablet (40  mg total) by mouth 2 (two) times daily.  60 tablet  11  . potassium chloride (K-DUR) 10 MEQ tablet TAKE ONE TABLET BY MOUTH EVERY DAY  30 tablet  11  . potassium chloride (K-DUR,KLOR-CON) 10 MEQ tablet Take 1 tablet (10 mEq total) by mouth daily.  30 tablet  11  . promethazine (PHENERGAN) 12.5 MG tablet TAKE ONE TABLET BY MOUTH EVERY 6 HOURS AS NEEDED FOR NAUSEA  30 tablet  2  . risperiDONE (RISPERDAL) 2 MG tablet Take 4 mg by mouth at bedtime.       Marland Kitchen zolpidem (AMBIEN) 10 MG tablet Take 1 tablet (10 mg total) by mouth at bedtime.  30 tablet  5   No current facility-administered medications on file prior to visit.   Review of Systems  Constitutional: Negative for unusual diaphoresis or other sweats  HENT: Negative for ringing in ear Eyes: Negative for double vision or worsening visual disturbance.  Respiratory: Negative for choking and stridor.   Gastrointestinal: Negative for vomiting or other signifcant bowel change Genitourinary: Negative for hematuria or decreased urine volume.  Musculoskeletal: Negative for other MSK pain or swelling Skin: Negative for color change and worsening wound.  Neurological: Negative for tremors and numbness other than noted  Psychiatric/Behavioral: Negative for decreased concentration or agitation other than above       Objective:   Physical Exam BP 112/72  Pulse 86  Temp(Src) 97.7 F (36.5 C) (Oral)  Ht 5\' 5"  (1.651 m)  Wt 164 lb 4 oz (74.503 kg)  BMI 27.33 kg/m2  SpO2 92% VS noted,  Constitutional: Pt appears well-developed, well-nourished.  HENT: Head: NCAT.  Right Ear: External ear normal.  Left Ear: External ear normal.  Eyes: . Pupils are equal, round, and reactive to light. Conjunctivae and EOM are normal Bilat tm's without erythema.  Max sinus areas non tender.  Pharynx with mild erythema, no exudate Neck: Normal range of motion. Neck supple.  Cardiovascular: Normal rate and regular rhythm.   Pulmonary/Chest: Effort normal and breath  sounds normal.  Abd:  Soft, NT, ND, + BS Neurological: Pt is alert. Not confused , motor grossly intact Skin: Skin is warm. No rash Psychiatric: Pt behavior is normal. No agitation. mild nervous but not depressed affect    Assessment & Plan:

## 2014-04-03 NOTE — Assessment & Plan Note (Signed)
To add pepcid as above,  to f/u any worsening symptoms or concerns

## 2014-04-03 NOTE — Assessment & Plan Note (Signed)
stable overall by history and exam, recent data reviewed with pt, and pt to continue medical treatment as before,  to f/u any worsening symptoms or concern Lab Results  Component Value Date   WBC 7.9 09/02/2013   HGB 14.2 09/02/2013   HCT 43.1 09/02/2013   PLT 262.0 09/02/2013   GLUCOSE 103* 09/02/2013   CHOL 159 09/02/2013   TRIG 145.0 09/02/2013   HDL 41.70 09/02/2013   LDLDIRECT 102.7 08/21/2011   LDLCALC 88 09/02/2013   ALT 11 09/02/2013   AST 11 09/02/2013   NA 138 09/02/2013   K 3.9 09/02/2013   CL 101 09/02/2013   CREATININE 0.76 09/13/2013   BUN 4* 09/13/2013   CO2 29 09/02/2013   TSH 0.83 09/02/2013   INR 1.03 05/03/2013   HGBA1C 5.5 08/24/2009

## 2014-04-03 NOTE — Assessment & Plan Note (Signed)
stable overall by history and exam, recent data reviewed with pt, and pt to continue medical treatment as before,  to f/u any worsening symptoms or concerns SpO2 Readings from Last 3 Encounters:  03/29/14 92%  02/03/14 96%  12/06/13 97%

## 2014-04-03 NOTE — Assessment & Plan Note (Signed)
Etiology unclear, long term smoker, will add pepcid 20 qd to her PPI regimen, also refer ENT - would need vocal cord evaluation

## 2014-04-07 ENCOUNTER — Telehealth: Payer: Self-pay | Admitting: Internal Medicine

## 2014-04-07 NOTE — Telephone Encounter (Signed)
Forwarding 2 pages to Dr.John, Jeneen Rinks

## 2014-04-14 ENCOUNTER — Telehealth (HOSPITAL_COMMUNITY): Payer: Self-pay | Admitting: Interventional Radiology

## 2014-04-14 NOTE — Telephone Encounter (Signed)
Called pt back told her that per Deveshwar it was OK for her to stop her Aspirin 325mg  x5 day prior to her surgery. JM

## 2014-05-11 ENCOUNTER — Other Ambulatory Visit: Payer: Self-pay

## 2014-05-11 MED ORDER — ATORVASTATIN CALCIUM 40 MG PO TABS: 40.0000 mg | ORAL_TABLET | Freq: Every day | ORAL | Status: DC

## 2014-06-30 ENCOUNTER — Encounter (HOSPITAL_COMMUNITY): Payer: Self-pay | Admitting: Interventional Radiology

## 2014-06-30 DIAGNOSIS — Z79891 Long term (current) use of opiate analgesic: Secondary | ICD-10-CM | POA: Diagnosis not present

## 2014-06-30 DIAGNOSIS — M542 Cervicalgia: Secondary | ICD-10-CM | POA: Diagnosis not present

## 2014-06-30 DIAGNOSIS — M4726 Other spondylosis with radiculopathy, lumbar region: Secondary | ICD-10-CM | POA: Diagnosis not present

## 2014-06-30 DIAGNOSIS — M5136 Other intervertebral disc degeneration, lumbar region: Secondary | ICD-10-CM | POA: Diagnosis not present

## 2014-07-05 DIAGNOSIS — Z720 Tobacco use: Secondary | ICD-10-CM | POA: Diagnosis not present

## 2014-07-05 DIAGNOSIS — J381 Polyp of vocal cord and larynx: Secondary | ICD-10-CM | POA: Diagnosis not present

## 2014-07-24 ENCOUNTER — Encounter: Payer: Self-pay | Admitting: Internal Medicine

## 2014-08-03 ENCOUNTER — Encounter: Payer: Self-pay | Admitting: Internal Medicine

## 2014-08-03 NOTE — Telephone Encounter (Signed)
Unfortunately pt has not been able to tolerate chantix, and is on other meds such that wellbutrin is not a good idea  The only other options are nicotine patch otc if she has not tried this, thanks

## 2014-08-11 DIAGNOSIS — K219 Gastro-esophageal reflux disease without esophagitis: Secondary | ICD-10-CM | POA: Diagnosis not present

## 2014-08-11 DIAGNOSIS — K209 Esophagitis, unspecified: Secondary | ICD-10-CM | POA: Diagnosis not present

## 2014-08-12 DIAGNOSIS — M4712 Other spondylosis with myelopathy, cervical region: Secondary | ICD-10-CM | POA: Diagnosis not present

## 2014-08-26 DIAGNOSIS — M19012 Primary osteoarthritis, left shoulder: Secondary | ICD-10-CM | POA: Diagnosis not present

## 2014-08-26 DIAGNOSIS — M67912 Unspecified disorder of synovium and tendon, left shoulder: Secondary | ICD-10-CM | POA: Diagnosis not present

## 2014-08-26 DIAGNOSIS — M47812 Spondylosis without myelopathy or radiculopathy, cervical region: Secondary | ICD-10-CM | POA: Diagnosis not present

## 2014-08-26 DIAGNOSIS — M75122 Complete rotator cuff tear or rupture of left shoulder, not specified as traumatic: Secondary | ICD-10-CM | POA: Diagnosis not present

## 2014-08-26 DIAGNOSIS — Z981 Arthrodesis status: Secondary | ICD-10-CM | POA: Diagnosis not present

## 2014-08-29 DIAGNOSIS — M961 Postlaminectomy syndrome, not elsewhere classified: Secondary | ICD-10-CM | POA: Diagnosis not present

## 2014-08-29 DIAGNOSIS — M542 Cervicalgia: Secondary | ICD-10-CM | POA: Diagnosis not present

## 2014-08-29 DIAGNOSIS — Z79891 Long term (current) use of opiate analgesic: Secondary | ICD-10-CM | POA: Diagnosis not present

## 2014-08-29 DIAGNOSIS — M4726 Other spondylosis with radiculopathy, lumbar region: Secondary | ICD-10-CM | POA: Diagnosis not present

## 2014-08-29 DIAGNOSIS — M5136 Other intervertebral disc degeneration, lumbar region: Secondary | ICD-10-CM | POA: Diagnosis not present

## 2014-10-04 ENCOUNTER — Other Ambulatory Visit (INDEPENDENT_AMBULATORY_CARE_PROVIDER_SITE_OTHER): Payer: Medicare Other

## 2014-10-04 ENCOUNTER — Encounter: Payer: Self-pay | Admitting: Internal Medicine

## 2014-10-04 ENCOUNTER — Ambulatory Visit (INDEPENDENT_AMBULATORY_CARE_PROVIDER_SITE_OTHER): Payer: Medicare Other | Admitting: Internal Medicine

## 2014-10-04 VITALS — BP 118/70 | HR 78 | Temp 98.4°F | Resp 18 | Ht 65.0 in | Wt 153.1 lb

## 2014-10-04 DIAGNOSIS — Z0000 Encounter for general adult medical examination without abnormal findings: Secondary | ICD-10-CM | POA: Diagnosis not present

## 2014-10-04 DIAGNOSIS — E785 Hyperlipidemia, unspecified: Secondary | ICD-10-CM

## 2014-10-04 DIAGNOSIS — R1030 Lower abdominal pain, unspecified: Secondary | ICD-10-CM

## 2014-10-04 LAB — HEPATIC FUNCTION PANEL
ALBUMIN: 4.2 g/dL (ref 3.5–5.2)
ALT: 11 U/L (ref 0–35)
AST: 11 U/L (ref 0–37)
Alkaline Phosphatase: 91 U/L (ref 39–117)
BILIRUBIN TOTAL: 0.5 mg/dL (ref 0.2–1.2)
Bilirubin, Direct: 0.1 mg/dL (ref 0.0–0.3)
Total Protein: 6.8 g/dL (ref 6.0–8.3)

## 2014-10-04 LAB — URINALYSIS, ROUTINE W REFLEX MICROSCOPIC
Bilirubin Urine: NEGATIVE
KETONES UR: NEGATIVE
Leukocytes, UA: NEGATIVE
Nitrite: NEGATIVE
SPECIFIC GRAVITY, URINE: 1.01 (ref 1.000–1.030)
Total Protein, Urine: NEGATIVE
Urine Glucose: NEGATIVE
Urobilinogen, UA: 0.2 (ref 0.0–1.0)
pH: 6 (ref 5.0–8.0)

## 2014-10-04 LAB — BASIC METABOLIC PANEL
BUN: 6 mg/dL (ref 6–23)
CHLORIDE: 101 meq/L (ref 96–112)
CO2: 29 meq/L (ref 19–32)
Calcium: 9.8 mg/dL (ref 8.4–10.5)
Creatinine, Ser: 1.03 mg/dL (ref 0.40–1.20)
GFR: 58.8 mL/min — ABNORMAL LOW (ref >60.00)
GLUCOSE: 95 mg/dL (ref 70–99)
POTASSIUM: 3.9 meq/L (ref 3.5–5.1)
SODIUM: 134 meq/L — AB (ref 135–145)

## 2014-10-04 LAB — CBC WITH DIFFERENTIAL/PLATELET
Basophils Absolute: 0.1 10*3/uL (ref 0.0–0.1)
Basophils Relative: 1.4 % (ref 0.0–3.0)
EOS ABS: 0.4 10*3/uL (ref 0.0–0.7)
Eosinophils Relative: 4.9 % (ref 0.0–5.0)
HCT: 46.7 % — ABNORMAL HIGH (ref 36.0–46.0)
Hemoglobin: 16.1 g/dL — ABNORMAL HIGH (ref 12.0–15.0)
Lymphocytes Relative: 31.5 % (ref 12.0–46.0)
Lymphs Abs: 2.7 10*3/uL (ref 0.7–4.0)
MCHC: 34.5 g/dL (ref 30.0–36.0)
MCV: 92.4 fl (ref 78.0–100.0)
Monocytes Absolute: 0.7 10*3/uL (ref 0.1–1.0)
Monocytes Relative: 8.4 % (ref 3.0–12.0)
Neutro Abs: 4.6 10*3/uL (ref 1.4–7.7)
Neutrophils Relative %: 53.8 % (ref 43.0–77.0)
Platelets: 240 10*3/uL (ref 150.0–400.0)
RBC: 5.05 Mil/uL (ref 3.87–5.11)
RDW: 14.7 % (ref 11.5–15.5)
WBC: 8.5 10*3/uL (ref 4.0–10.5)

## 2014-10-04 LAB — LIPID PANEL
Cholesterol: 200 mg/dL (ref 0–200)
HDL: 35.8 mg/dL — ABNORMAL LOW (ref >39.00)
LDL Cholesterol: 127 mg/dL — ABNORMAL HIGH (ref 0–99)
NONHDL: 164.2
Total CHOL/HDL Ratio: 6
Triglycerides: 188 mg/dL — ABNORMAL HIGH (ref 0.0–149.0)
VLDL: 37.6 mg/dL (ref 0.0–40.0)

## 2014-10-04 LAB — TSH: TSH: 1.89 u[IU]/mL (ref 0.35–4.50)

## 2014-10-04 NOTE — Assessment & Plan Note (Signed)

## 2014-10-04 NOTE — Patient Instructions (Signed)
Please continue all other medications as before, and refills have been done if requested.  Please have the pharmacy call with any other refills you may need.  Please continue your efforts at being more active, low cholesterol diet, and weight control.  You are otherwise up to date with prevention measures today.  Please keep your appointments with your specialists as you may have planned  Please go to the LAB in the Basement (turn left off the elevator) for the tests to be done today  You will be contacted by phone if any changes need to be made immediately.  Otherwise, you will receive a letter about your results with an explanation, but please check with MyChart first.  Please remember to sign up for MyChart if you have not done so, as this will be important to you in the future with finding out test results, communicating by private email, and scheduling acute appointments online when needed.  Please return in 1 year for your yearly visit, or sooner if needed, with Lab testing done 3-5 days before  Good Luck with your Surguries coming up.

## 2014-10-04 NOTE — Progress Notes (Signed)
Subjective:    Patient ID: [name], female    DOB: 31-Mar-1956, 59 y.o.   MRN: [id number]  HPI  Here for wellness and f/u;  Overall doing ok;  Pt denies Chest pain, worsening SOB, DOE, wheezing, orthopnea, PND, worsening LE edema, palpitations, dizziness or syncope.  Pt denies neurological change such as new headache, facial or extremity weakness.  Pt denies polydipsia, polyuria, or low sugar symptoms. Pt states overall good compliance with treatment and medications, good tolerability, and has been trying to follow appropriate diet.  Pt denies worsening depressive symptoms, suicidal ideation or panic. No fever, night sweats, wt loss, loss of appetite, or other constitutional symptoms.  Pt states good ability with ADL's, has low fall risk, home safety reviewed and adequate, no other significant changes in hearing or vision, and only occasionally active with exercise.  Has appt soon Mon with Dr Bell/NS for upcoming c-spine surgury, and also right shoulder surgury later per ortho. No current complaints.  Gets yearly colonsocpy for now per GI Dr Erlene Quan due to recurrent polyps. Does also have ongoing rlq pain x 2-3 months but no hernialike swelling or new GI or GU complaints.  Declines to see gen surg as she has 2 other surguries upcoming. S/p tah bso  Past Medical History  Diagnosis Date  . Depression     bipolar  . OCD (obsessive compulsive disorder)     anxiety  . Migraines   . Brain aneurysm   . Chronic abdominal pain   . History of colonic polyps   . Peripheral neuropathy   . Fibromyalgia   . HLD (hyperlipidemia)   . GERD (gastroesophageal reflux disease)   . IBS (irritable bowel syndrome)     chronic constipation  . Allergic rhinitis   . Gastroparesis   . COPD (chronic obstructive pulmonary disease)     denies  . H/O hiatal hernia   . Arthritis    Past Surgical History  Procedure Laterality Date  . Appendectomy    . Cholecystectomy    . Vesicovaginal fistula closure w/ tah      . Oophorectomy    . Coil to brain aneurysm    . Shoulder surgery Left   . Elbow surgery Bilateral     tendonitis  . Abdominal hysterectomy    . Cervical disc surgery    . Colonoscopy    . Tubal ligation    . Radiology with anesthesia N/A 05/10/2013    Procedure: RADIOLOGY WITH ANESTHESIA;  Surgeon: Rob Hickman, MD;  Location: [facility];  Service: Radiology;  Laterality: N/A;    reports that she has been smoking Cigarettes.  She has a 39 pack-year smoking history. She has never used smokeless tobacco. She reports that she does not drink alcohol or use illicit drugs. family history includes Cancer in her father, mother, sister, and another family member; Coronary artery disease in an other family member; Diabetes in an other family member; Hypertension in an other family member; Leukemia in her sister; Seizures in an other family member. There is no history of Colon cancer. Allergies  Allergen Reactions  . Hydrocodone Nausea Only    Can take it if its in a cough syrup   . Nsaids     REACTION: GI irritation  . Propoxyphene N-Acetaminophen Itching  . Tramadol     REACTION: nausea  . Varenicline Tartrate     REACTION: agitation, depression, sick to stomach  . Chantix [Varenicline] Other (See Comments)    "Makes  bipolar worse"  . Other Other (See Comments)    Lactose intolerant   Current Outpatient Prescriptions on File Prior to Visit  Medication Sig Dispense Refill  . albuterol (PROVENTIL HFA;VENTOLIN HFA) 108 (90 BASE) MCG/ACT inhaler Inhale 2 puffs into the lungs every 6 (six) hours as needed for wheezing or shortness of breath. 1 Inhaler 11  . aspirin 325 MG tablet Take 325 mg by mouth daily.    Marland Kitchen atorvastatin (LIPITOR) 40 MG tablet Take 1 tablet (40 mg total) by mouth daily. 90 tablet 3  . benztropine (COGENTIN) 1 MG tablet Take 1 mg by mouth 2 (two) times daily.    . Cholecalciferol (VITAMIN D3) 1000 UNITS CAPS Take 1,000 Units by mouth daily.     . cyanocobalamin 1000 MCG  tablet Take 1,000 mcg by mouth daily.     . DULoxetine (CYMBALTA) 20 MG capsule Take 60 mg by mouth daily.     . famotidine (PEPCID) 20 MG tablet Take 1 tablet (20 mg total) by mouth 2 (two) times daily. 90 tablet 3  . lamoTRIgine (LAMICTAL) 200 MG tablet Take 200 mg by mouth daily.    . Lurasidone HCl (LATUDA) 120 MG TABS Take 120 mg by mouth at bedtime.    Marland Kitchen oxyCODONE (ROXICODONE) 15 MG immediate release tablet Take 15 mg by mouth 4 (four) times daily as needed for pain.    . pantoprazole (PROTONIX) 40 MG tablet Take 1 tablet (40 mg total) by mouth 2 (two) times daily. 60 tablet 11  . potassium chloride (K-DUR) 10 MEQ tablet TAKE ONE TABLET BY MOUTH EVERY DAY 30 tablet 11  . potassium chloride (K-DUR,KLOR-CON) 10 MEQ tablet Take 1 tablet (10 mEq total) by mouth daily. 30 tablet 11  . promethazine (PHENERGAN) 12.5 MG tablet TAKE ONE TABLET BY MOUTH EVERY 6 HOURS AS NEEDED FOR NAUSEA 30 tablet 2  . risperiDONE (RISPERDAL) 2 MG tablet Take 4 mg by mouth at bedtime.     Marland Kitchen zolpidem (AMBIEN) 10 MG tablet Take 1 tablet (10 mg total) by mouth at bedtime. 30 tablet 5   No current facility-administered medications on file prior to visit.   Review of Systems Constitutional: Negative for increased diaphoresis, other activity, appetite or siginficant weight change other than noted HENT: Negative for worsening hearing loss, ear pain, facial swelling, mouth sores and neck stiffness.   Eyes: Negative for other worsening pain, redness or visual disturbance.  Respiratory: Negative for shortness of breath and wheezing  Cardiovascular: Negative for chest pain and palpitations.  Gastrointestinal: Negative for diarrhea, blood in stool, abdominal distention or other pain Genitourinary: Negative for hematuria, flank pain or change in urine volume.  Musculoskeletal: Negative for myalgias or other joint complaints.  Skin: Negative for color change and wound or drainage.  Neurological: Negative for syncope and  numbness. other than noted Hematological: Negative for adenopathy. or other swelling Psychiatric/Behavioral: Negative for hallucinations, SI, self-injury, decreased concentration or other worsening agitation.      Objective:   Physical Exam BP 118/70 mmHg  Pulse 78  Temp(Src) 98.4 F (36.9 C) (Oral)  Resp 18  Ht 5\' 5"  (1.651 m)  Wt 153 lb 1.3 oz (69.437 kg)  BMI 25.47 kg/m2  SpO2 98% VS noted,  Constitutional: Pt is oriented to person, place, and time. Appears well-developed and well-nourished, in no significant distress Head: Normocephalic and atraumatic.  Right Ear: External ear normal.  Left Ear: External ear normal.  Nose: Nose normal.  Mouth/Throat: Oropharynx is clear and moist.  Eyes: Conjunctivae and EOM are normal. Pupils are equal, round, and reactive to light.  Neck: Normal range of motion. Neck supple. No JVD present. No tracheal deviation present or significant neck LA or mass Cardiovascular: Normal rate, regular rhythm, normal heart sounds and intact distal pulses.   Pulmonary/Chest: Effort normal and breath sounds without rales or wheezing  Abdominal: Soft. Bowel sounds are normal.  No HSM , does have a tender spot in the RLQ near bladder Musculoskeletal: Normal range of motion. Exhibits no edema.  Lymphadenopathy:  Has no cervical adenopathy.  Neurological: Pt is alert and oriented to person, place, and time. Pt has normal reflexes. No cranial nerve deficit. Motor grossly intact Skin: Skin is warm and dry. No rash noted.  Psychiatric:  Has normal mood and affect. Behavior is normal.     Assessment & Plan:

## 2014-10-04 NOTE — Assessment & Plan Note (Signed)
Right sided - ? msk vs other - exam o/w benign, for labs as ordered, follow for any clinical worsening

## 2014-10-07 ENCOUNTER — Telehealth: Payer: Self-pay | Admitting: Internal Medicine

## 2014-10-07 NOTE — Telephone Encounter (Signed)
Patient is requesting refills of promethozine, atorvastatin, famotidine, lamotrigine, pantoprazole and risperidone to be sent to [facility].

## 2014-10-07 NOTE — Telephone Encounter (Signed)
Patient Name: [name] DOB: [birth year] Initial Comment caller states she needs refills on medication sent to mail order pharmacy Nurse Assessment Nurse: Ronnald Ramp, RN, [place] Date/Time Eilene Ghazi Time): 10/07/2014 1:10:09 PM Confirm and document reason for call. If symptomatic, describe symptoms. ---Caller states she needs refills/orders for medications. Per directives, med refills are managed by the office during normal hours. Has the patient traveled out of the country within the last 30 days? ---Not Applicable Does the patient require triage? ---No Please document clinical information provided and list any resource used. ---Warm transferred to Tammy at the main number. Guidelines Guideline Title Affirmed Question Affirmed Notes Final Disposition User Clinical Call Ronnald Ramp, RN, [employer]

## 2014-10-08 MED ORDER — LAMOTRIGINE 200 MG PO TABS: 200.0000 mg | ORAL_TABLET | Freq: Every day | ORAL | Status: DC

## 2014-10-08 MED ORDER — RISPERIDONE 2 MG PO TABS: 4.0000 mg | ORAL_TABLET | Freq: Every day | ORAL | Status: DC

## 2014-10-08 MED ORDER — ATORVASTATIN CALCIUM 40 MG PO TABS: 40.0000 mg | ORAL_TABLET | Freq: Every day | ORAL | Status: DC

## 2014-10-08 MED ORDER — PROMETHAZINE HCL 12.5 MG PO TABS
12.5000 mg | ORAL_TABLET | Freq: Four times a day (QID) | ORAL | Status: DC | PRN
Start: 2014-10-08 — End: 2014-12-16

## 2014-10-08 MED ORDER — FAMOTIDINE 20 MG PO TABS: 20.0000 mg | ORAL_TABLET | Freq: Two times a day (BID) | ORAL | Status: DC

## 2014-10-10 DIAGNOSIS — M4712 Other spondylosis with myelopathy, cervical region: Secondary | ICD-10-CM | POA: Diagnosis not present

## 2014-10-19 DIAGNOSIS — M5136 Other intervertebral disc degeneration, lumbar region: Secondary | ICD-10-CM | POA: Diagnosis not present

## 2014-10-19 DIAGNOSIS — M542 Cervicalgia: Secondary | ICD-10-CM | POA: Diagnosis not present

## 2014-10-19 DIAGNOSIS — M4726 Other spondylosis with radiculopathy, lumbar region: Secondary | ICD-10-CM | POA: Diagnosis not present

## 2014-10-19 DIAGNOSIS — Z79891 Long term (current) use of opiate analgesic: Secondary | ICD-10-CM | POA: Diagnosis not present

## 2014-10-19 DIAGNOSIS — M961 Postlaminectomy syndrome, not elsewhere classified: Secondary | ICD-10-CM | POA: Diagnosis not present

## 2014-10-21 DIAGNOSIS — Z1231 Encounter for screening mammogram for malignant neoplasm of breast: Secondary | ICD-10-CM | POA: Diagnosis not present

## 2014-12-01 DIAGNOSIS — M960 Pseudarthrosis after fusion or arthrodesis: Secondary | ICD-10-CM | POA: Diagnosis not present

## 2014-12-01 DIAGNOSIS — K219 Gastro-esophageal reflux disease without esophagitis: Secondary | ICD-10-CM | POA: Diagnosis not present

## 2014-12-01 DIAGNOSIS — M4712 Other spondylosis with myelopathy, cervical region: Secondary | ICD-10-CM | POA: Diagnosis not present

## 2014-12-01 DIAGNOSIS — M5002 Cervical disc disorder with myelopathy, mid-cervical region: Secondary | ICD-10-CM | POA: Diagnosis not present

## 2014-12-01 DIAGNOSIS — J449 Chronic obstructive pulmonary disease, unspecified: Secondary | ICD-10-CM | POA: Diagnosis not present

## 2014-12-01 DIAGNOSIS — Z981 Arthrodesis status: Secondary | ICD-10-CM | POA: Diagnosis not present

## 2014-12-01 DIAGNOSIS — M5032 Other cervical disc degeneration, mid-cervical region: Secondary | ICD-10-CM | POA: Diagnosis not present

## 2014-12-01 DIAGNOSIS — M1990 Unspecified osteoarthritis, unspecified site: Secondary | ICD-10-CM | POA: Diagnosis not present

## 2014-12-01 DIAGNOSIS — T84038A Mechanical loosening of other internal prosthetic joint, initial encounter: Secondary | ICD-10-CM | POA: Diagnosis not present

## 2014-12-01 DIAGNOSIS — Z888 Allergy status to other drugs, medicaments and biological substances status: Secondary | ICD-10-CM | POA: Diagnosis not present

## 2014-12-01 DIAGNOSIS — M5001 Cervical disc disorder with myelopathy,  high cervical region: Secondary | ICD-10-CM | POA: Diagnosis not present

## 2014-12-01 DIAGNOSIS — F17210 Nicotine dependence, cigarettes, uncomplicated: Secondary | ICD-10-CM | POA: Diagnosis not present

## 2014-12-01 DIAGNOSIS — F319 Bipolar disorder, unspecified: Secondary | ICD-10-CM | POA: Diagnosis not present

## 2014-12-01 DIAGNOSIS — E785 Hyperlipidemia, unspecified: Secondary | ICD-10-CM | POA: Diagnosis not present

## 2014-12-01 DIAGNOSIS — Z885 Allergy status to narcotic agent status: Secondary | ICD-10-CM | POA: Diagnosis not present

## 2014-12-16 ENCOUNTER — Other Ambulatory Visit: Payer: Self-pay

## 2014-12-16 MED ORDER — PROMETHAZINE HCL 12.5 MG PO TABS: 12.5000 mg | ORAL_TABLET | Freq: Four times a day (QID) | ORAL | Status: DC | PRN

## 2014-12-16 MED ORDER — FAMOTIDINE 20 MG PO TABS: 20.0000 mg | ORAL_TABLET | Freq: Two times a day (BID) | ORAL | Status: DC

## 2014-12-21 ENCOUNTER — Other Ambulatory Visit: Payer: Self-pay | Admitting: Internal Medicine

## 2014-12-28 DIAGNOSIS — K5900 Constipation, unspecified: Secondary | ICD-10-CM | POA: Diagnosis not present

## 2014-12-30 ENCOUNTER — Other Ambulatory Visit (HOSPITAL_COMMUNITY): Payer: Self-pay | Admitting: Interventional Radiology

## 2014-12-30 DIAGNOSIS — I671 Cerebral aneurysm, nonruptured: Secondary | ICD-10-CM

## 2015-01-02 ENCOUNTER — Encounter: Payer: Self-pay | Admitting: Internal Medicine

## 2015-01-03 ENCOUNTER — Encounter: Payer: Self-pay | Admitting: Internal Medicine

## 2015-01-03 MED ORDER — FAMOTIDINE 20 MG PO TABS: 20.0000 mg | ORAL_TABLET | Freq: Two times a day (BID) | ORAL | Status: DC

## 2015-01-06 ENCOUNTER — Other Ambulatory Visit: Payer: Self-pay | Admitting: Internal Medicine

## 2015-01-06 MED ORDER — FAMOTIDINE 20 MG PO TABS: 20.0000 mg | ORAL_TABLET | Freq: Every day | ORAL | Status: DC

## 2015-01-09 DIAGNOSIS — M4726 Other spondylosis with radiculopathy, lumbar region: Secondary | ICD-10-CM | POA: Diagnosis not present

## 2015-01-09 DIAGNOSIS — Z79891 Long term (current) use of opiate analgesic: Secondary | ICD-10-CM | POA: Diagnosis not present

## 2015-01-09 DIAGNOSIS — M542 Cervicalgia: Secondary | ICD-10-CM | POA: Diagnosis not present

## 2015-01-09 DIAGNOSIS — M5136 Other intervertebral disc degeneration, lumbar region: Secondary | ICD-10-CM | POA: Diagnosis not present

## 2015-01-09 DIAGNOSIS — M961 Postlaminectomy syndrome, not elsewhere classified: Secondary | ICD-10-CM | POA: Diagnosis not present

## 2015-01-13 DIAGNOSIS — D120 Benign neoplasm of cecum: Secondary | ICD-10-CM | POA: Diagnosis not present

## 2015-01-13 DIAGNOSIS — Z86010 Personal history of colonic polyps: Secondary | ICD-10-CM | POA: Diagnosis not present

## 2015-01-13 DIAGNOSIS — D123 Benign neoplasm of transverse colon: Secondary | ICD-10-CM | POA: Diagnosis not present

## 2015-01-19 ENCOUNTER — Encounter (HOSPITAL_COMMUNITY): Payer: Self-pay

## 2015-01-19 ENCOUNTER — Ambulatory Visit (HOSPITAL_COMMUNITY)
Admission: RE | Admit: 2015-01-19 | Discharge: 2015-01-19 | Disposition: A | Discharge status: Home / Self Care | Payer: Medicare Other | Source: Ambulatory Visit | Attending: Interventional Radiology | Admitting: Interventional Radiology

## 2015-01-19 DIAGNOSIS — I672 Cerebral atherosclerosis: Secondary | ICD-10-CM | POA: Insufficient documentation

## 2015-01-19 DIAGNOSIS — I671 Cerebral aneurysm, nonruptured: Secondary | ICD-10-CM

## 2015-01-19 MED ORDER — IOHEXOL 350 MG/ML SOLN
50.0000 mL | Freq: Once | INTRAVENOUS | Status: AC | PRN
  Administered 2015-01-19: 50 mL via INTRAVENOUS

## 2015-01-23 ENCOUNTER — Telehealth (HOSPITAL_COMMUNITY): Payer: Self-pay | Admitting: Interventional Radiology

## 2015-01-23 NOTE — Telephone Encounter (Signed)
Called pt, told her that her next f/u CTA head/neck would be due in 1 yrs time per Deveshwar. She states understanding and is in agreement. JM

## 2015-02-06 DIAGNOSIS — M7582 Other shoulder lesions, left shoulder: Secondary | ICD-10-CM | POA: Diagnosis not present

## 2015-02-06 DIAGNOSIS — M25512 Pain in left shoulder: Secondary | ICD-10-CM | POA: Diagnosis not present

## 2015-02-16 ENCOUNTER — Telehealth: Payer: Self-pay | Admitting: Internal Medicine

## 2015-02-16 NOTE — Telephone Encounter (Signed)
Can you please call Leann from [facility] [place] [facility] at [phone].

## 2015-02-17 NOTE — Telephone Encounter (Signed)
Left message on machine for Leann to return my call

## 2015-02-17 NOTE — Telephone Encounter (Signed)
[name] requested an appointment for surgical clearance. She will contact pt to call and schedule an appointment

## 2015-02-22 ENCOUNTER — Telehealth (HOSPITAL_COMMUNITY): Payer: Self-pay | Admitting: Interventional Radiology

## 2015-02-22 NOTE — Telephone Encounter (Signed)
Pt called and stated that she is scheduled for rotator cuff repair surgery next week and wanted to know if it was OK for her to come off of her Aspirin 325mg  for 1 week prior to her surgery. I called Deveshwar and relayed this question to him while the patient was on hold. He stated that she could stop the Aspirin 325mg  1 QD for 1 week but she would need to go back on this medicine ASAP following her surgery. The patient states understanding and agrees with this plan of care. JM

## 2015-03-01 DIAGNOSIS — F319 Bipolar disorder, unspecified: Secondary | ICD-10-CM | POA: Diagnosis not present

## 2015-03-01 DIAGNOSIS — J449 Chronic obstructive pulmonary disease, unspecified: Secondary | ICD-10-CM | POA: Diagnosis not present

## 2015-03-01 DIAGNOSIS — M75122 Complete rotator cuff tear or rupture of left shoulder, not specified as traumatic: Secondary | ICD-10-CM | POA: Diagnosis not present

## 2015-03-01 DIAGNOSIS — F17210 Nicotine dependence, cigarettes, uncomplicated: Secondary | ICD-10-CM | POA: Diagnosis not present

## 2015-03-01 DIAGNOSIS — M66112 Rupture of synovium, left shoulder: Secondary | ICD-10-CM | POA: Diagnosis not present

## 2015-03-01 DIAGNOSIS — M1990 Unspecified osteoarthritis, unspecified site: Secondary | ICD-10-CM | POA: Diagnosis not present

## 2015-03-01 DIAGNOSIS — Z885 Allergy status to narcotic agent status: Secondary | ICD-10-CM | POA: Diagnosis not present

## 2015-03-01 DIAGNOSIS — M7542 Impingement syndrome of left shoulder: Secondary | ICD-10-CM | POA: Diagnosis not present

## 2015-03-01 DIAGNOSIS — M75102 Unspecified rotator cuff tear or rupture of left shoulder, not specified as traumatic: Secondary | ICD-10-CM | POA: Diagnosis not present

## 2015-03-01 DIAGNOSIS — E785 Hyperlipidemia, unspecified: Secondary | ICD-10-CM | POA: Diagnosis not present

## 2015-03-01 DIAGNOSIS — G43909 Migraine, unspecified, not intractable, without status migrainosus: Secondary | ICD-10-CM | POA: Diagnosis not present

## 2015-03-01 DIAGNOSIS — M7522 Bicipital tendinitis, left shoulder: Secondary | ICD-10-CM | POA: Diagnosis not present

## 2015-03-01 DIAGNOSIS — K219 Gastro-esophageal reflux disease without esophagitis: Secondary | ICD-10-CM | POA: Diagnosis not present

## 2015-03-01 DIAGNOSIS — Z79899 Other long term (current) drug therapy: Secondary | ICD-10-CM | POA: Diagnosis not present

## 2015-03-01 DIAGNOSIS — M24112 Other articular cartilage disorders, left shoulder: Secondary | ICD-10-CM | POA: Diagnosis not present

## 2015-03-01 DIAGNOSIS — Z888 Allergy status to other drugs, medicaments and biological substances status: Secondary | ICD-10-CM | POA: Diagnosis not present

## 2015-03-01 DIAGNOSIS — Z7982 Long term (current) use of aspirin: Secondary | ICD-10-CM | POA: Diagnosis not present

## 2015-03-01 DIAGNOSIS — S46012A Strain of muscle(s) and tendon(s) of the rotator cuff of left shoulder, initial encounter: Secondary | ICD-10-CM | POA: Diagnosis not present

## 2015-03-01 DIAGNOSIS — M5136 Other intervertebral disc degeneration, lumbar region: Secondary | ICD-10-CM | POA: Diagnosis not present

## 2015-03-01 DIAGNOSIS — S43402D Unspecified sprain of left shoulder joint, subsequent encounter: Secondary | ICD-10-CM | POA: Diagnosis not present

## 2015-03-01 DIAGNOSIS — G8918 Other acute postprocedural pain: Secondary | ICD-10-CM | POA: Diagnosis not present

## 2015-03-08 DIAGNOSIS — M7582 Other shoulder lesions, left shoulder: Secondary | ICD-10-CM | POA: Diagnosis not present

## 2015-03-08 DIAGNOSIS — M25512 Pain in left shoulder: Secondary | ICD-10-CM | POA: Diagnosis not present

## 2015-03-08 DIAGNOSIS — M2560 Stiffness of unspecified joint, not elsewhere classified: Secondary | ICD-10-CM | POA: Diagnosis not present

## 2015-03-11 ENCOUNTER — Other Ambulatory Visit: Payer: Self-pay | Admitting: Internal Medicine

## 2015-03-13 DIAGNOSIS — A419 Sepsis, unspecified organism: Secondary | ICD-10-CM | POA: Diagnosis not present

## 2015-03-13 DIAGNOSIS — G8929 Other chronic pain: Secondary | ICD-10-CM | POA: Diagnosis not present

## 2015-03-13 DIAGNOSIS — J90 Pleural effusion, not elsewhere classified: Secondary | ICD-10-CM | POA: Diagnosis not present

## 2015-03-13 DIAGNOSIS — R0600 Dyspnea, unspecified: Secondary | ICD-10-CM | POA: Diagnosis not present

## 2015-03-13 DIAGNOSIS — F17210 Nicotine dependence, cigarettes, uncomplicated: Secondary | ICD-10-CM | POA: Diagnosis not present

## 2015-03-13 DIAGNOSIS — G92 Toxic encephalopathy: Secondary | ICD-10-CM | POA: Diagnosis not present

## 2015-03-13 DIAGNOSIS — J189 Pneumonia, unspecified organism: Secondary | ICD-10-CM | POA: Diagnosis not present

## 2015-03-13 DIAGNOSIS — F319 Bipolar disorder, unspecified: Secondary | ICD-10-CM | POA: Diagnosis not present

## 2015-03-13 DIAGNOSIS — N183 Chronic kidney disease, stage 3 (moderate): Secondary | ICD-10-CM | POA: Diagnosis not present

## 2015-03-13 DIAGNOSIS — J449 Chronic obstructive pulmonary disease, unspecified: Secondary | ICD-10-CM | POA: Diagnosis not present

## 2015-03-13 DIAGNOSIS — K219 Gastro-esophageal reflux disease without esophagitis: Secondary | ICD-10-CM | POA: Diagnosis not present

## 2015-03-13 DIAGNOSIS — R918 Other nonspecific abnormal finding of lung field: Secondary | ICD-10-CM | POA: Diagnosis not present

## 2015-03-13 DIAGNOSIS — T50904A Poisoning by unspecified drugs, medicaments and biological substances, undetermined, initial encounter: Secondary | ICD-10-CM | POA: Diagnosis not present

## 2015-03-13 DIAGNOSIS — R748 Abnormal levels of other serum enzymes: Secondary | ICD-10-CM | POA: Diagnosis not present

## 2015-03-13 DIAGNOSIS — R946 Abnormal results of thyroid function studies: Secondary | ICD-10-CM | POA: Diagnosis not present

## 2015-03-13 DIAGNOSIS — J9601 Acute respiratory failure with hypoxia: Secondary | ICD-10-CM | POA: Diagnosis not present

## 2015-03-13 DIAGNOSIS — T50901A Poisoning by unspecified drugs, medicaments and biological substances, accidental (unintentional), initial encounter: Secondary | ICD-10-CM | POA: Diagnosis not present

## 2015-03-13 DIAGNOSIS — J984 Other disorders of lung: Secondary | ICD-10-CM | POA: Diagnosis not present

## 2015-03-13 DIAGNOSIS — Z888 Allergy status to other drugs, medicaments and biological substances status: Secondary | ICD-10-CM | POA: Diagnosis not present

## 2015-03-13 DIAGNOSIS — Z885 Allergy status to narcotic agent status: Secondary | ICD-10-CM | POA: Diagnosis not present

## 2015-03-13 DIAGNOSIS — R4182 Altered mental status, unspecified: Secondary | ICD-10-CM | POA: Diagnosis not present

## 2015-03-13 DIAGNOSIS — J9602 Acute respiratory failure with hypercapnia: Secondary | ICD-10-CM | POA: Diagnosis not present

## 2015-03-13 DIAGNOSIS — J690 Pneumonitis due to inhalation of food and vomit: Secondary | ICD-10-CM | POA: Diagnosis not present

## 2015-03-13 DIAGNOSIS — T40601A Poisoning by unspecified narcotics, accidental (unintentional), initial encounter: Secondary | ICD-10-CM | POA: Diagnosis not present

## 2015-03-13 DIAGNOSIS — E780 Pure hypercholesterolemia: Secondary | ICD-10-CM | POA: Diagnosis not present

## 2015-03-13 DIAGNOSIS — E876 Hypokalemia: Secondary | ICD-10-CM | POA: Diagnosis not present

## 2015-03-13 DIAGNOSIS — Z79899 Other long term (current) drug therapy: Secondary | ICD-10-CM | POA: Diagnosis not present

## 2015-03-13 DIAGNOSIS — G9341 Metabolic encephalopathy: Secondary | ICD-10-CM | POA: Diagnosis not present

## 2015-03-13 DIAGNOSIS — K449 Diaphragmatic hernia without obstruction or gangrene: Secondary | ICD-10-CM | POA: Diagnosis not present

## 2015-03-13 DIAGNOSIS — M5000 Cervical disc disorder with myelopathy, unspecified cervical region: Secondary | ICD-10-CM | POA: Diagnosis not present

## 2015-03-13 DIAGNOSIS — I081 Rheumatic disorders of both mitral and tricuspid valves: Secondary | ICD-10-CM | POA: Diagnosis not present

## 2015-03-21 DIAGNOSIS — M542 Cervicalgia: Secondary | ICD-10-CM | POA: Diagnosis not present

## 2015-03-21 DIAGNOSIS — M4726 Other spondylosis with radiculopathy, lumbar region: Secondary | ICD-10-CM | POA: Diagnosis not present

## 2015-03-21 DIAGNOSIS — M961 Postlaminectomy syndrome, not elsewhere classified: Secondary | ICD-10-CM | POA: Diagnosis not present

## 2015-03-21 DIAGNOSIS — M5136 Other intervertebral disc degeneration, lumbar region: Secondary | ICD-10-CM | POA: Diagnosis not present

## 2015-03-21 DIAGNOSIS — Z79891 Long term (current) use of opiate analgesic: Secondary | ICD-10-CM | POA: Diagnosis not present

## 2015-03-27 ENCOUNTER — Inpatient Hospital Stay: Payer: Self-pay | Admitting: Internal Medicine

## 2015-03-31 ENCOUNTER — Encounter: Payer: Self-pay | Admitting: Internal Medicine

## 2015-03-31 ENCOUNTER — Ambulatory Visit (INDEPENDENT_AMBULATORY_CARE_PROVIDER_SITE_OTHER): Payer: Medicare Other | Admitting: Internal Medicine

## 2015-03-31 VITALS — BP 94/60 | HR 92 | Temp 98.1°F | Ht 65.0 in | Wt 150.0 lb

## 2015-03-31 DIAGNOSIS — J438 Other emphysema: Secondary | ICD-10-CM

## 2015-03-31 DIAGNOSIS — J690 Pneumonitis due to inhalation of food and vomit: Secondary | ICD-10-CM | POA: Diagnosis not present

## 2015-03-31 DIAGNOSIS — Z23 Encounter for immunization: Secondary | ICD-10-CM

## 2015-03-31 DIAGNOSIS — R7989 Other specified abnormal findings of blood chemistry: Secondary | ICD-10-CM

## 2015-03-31 MED ORDER — BENZTROPINE MESYLATE 1 MG PO TABS: 1.0000 mg | ORAL_TABLET | Freq: Two times a day (BID) | ORAL | Status: DC

## 2015-03-31 NOTE — Progress Notes (Signed)
Pre visit review using our clinic review tool, if applicable. No additional management support is needed unless otherwise documented below in the visit note. 

## 2015-03-31 NOTE — Progress Notes (Signed)
Subjective:    Patient ID: [name], female    DOB: [birth year], 57 y.o.   MRN: [id number]  HPI  Here to fu accidental OD complicated aspiration pneumonia/sepsis with dilaudid rx per ortho on top of usual meds per pain management, did not require ventilator though prob had bipap for 1.5 days to start.  No fever, and Pt denies chest pain, increased sob or doe, wheezing, orthopnea, PND, increased LE swelling, palpitations, dizziness or syncope, using inhaler usual dosing. Pt recalls recommendation at d/c for TSH, pulm referral, CT chest f/u in 3 months for pulm nodule, and pneumonia vaccinaations. Pt denies new neurological symptoms such as new headache, or facial or extremity weakness or numbness, but has had increased distal LUE tremor off the cogentin, asks for refill.   Pt denies polydipsia, polyuria. Denies hyper or hypo thyroid symptoms such as voice, skin or hair change. Past Medical History  Diagnosis Date  . Depression     bipolar  . OCD (obsessive compulsive disorder)     anxiety  . Migraines   . Brain aneurysm   . Chronic abdominal pain   . History of colonic polyps   . Peripheral neuropathy ([place])   . Fibromyalgia   . HLD (hyperlipidemia)   . GERD (gastroesophageal reflux disease)   . IBS (irritable bowel syndrome)     chronic constipation  . Allergic rhinitis   . Gastroparesis   . COPD (chronic obstructive pulmonary disease) ([place])     denies  . H/O hiatal hernia   . Arthritis    Past Surgical History  Procedure Laterality Date  . Appendectomy    . Cholecystectomy    . Vesicovaginal fistula closure w/ tah    . Oophorectomy    . Coil to brain aneurysm    . Shoulder surgery Left   . Elbow surgery Bilateral     tendonitis  . Abdominal hysterectomy    . Cervical disc surgery    . Colonoscopy    . Tubal ligation    . Radiology with anesthesia N/A 05/10/2013    Procedure: RADIOLOGY WITH ANESTHESIA;  Surgeon: Rob Hickman, MD;  Location: [facility];  Service:  Radiology;  Laterality: N/A;    reports that she has been smoking Cigarettes.  She has a 39 pack-year smoking history. She has never used smokeless tobacco. She reports that she does not drink alcohol or use illicit drugs. family history includes Cancer in her father, mother, sister, and another family member; Coronary artery disease in an other family member; Diabetes in an other family member; Hypertension in an other family member; Leukemia in her sister; Seizures in an other family member. There is no history of Colon cancer. Allergies  Allergen Reactions  . Ambien [Zolpidem Tartrate] Other (See Comments)    Memory issues  . Hydrocodone Nausea Only    Can take it if its in a cough syrup   . Nsaids     REACTION: GI irritation  . Propoxyphene N-Acetaminophen Itching  . Tramadol     REACTION: nausea  . Varenicline Tartrate     REACTION: agitation, depression, sick to stomach  . Chantix [Varenicline] Other (See Comments)    "Makes bipolar worse"  . Other Other (See Comments)    Lactose intolerant   Review of Systems  Constitutional: Negative for unusual diaphoresis or night sweats HENT: Negative for ringing in ear or discharge Eyes: Negative for double vision or worsening visual disturbance.  Respiratory: Negative for choking and stridor.  Gastrointestinal: Negative for vomiting or other signifcant bowel change Genitourinary: Negative for hematuria or change in urine volume.  Musculoskeletal: Negative for other MSK pain or swelling Skin: Negative for color change and worsening wound.  Neurological: Negative for tremors and numbness other than noted  Psychiatric/Behavioral: Negative for decreased concentration or agitation other than above       Objective:   Physical Exam BP 94/60 mmHg  Pulse 92  Temp(Src) 98.1 F (36.7 C) (Oral)  Ht 5\' 5"  (1.651 m)  Wt 150 lb (68.04 kg)  BMI 24.96 kg/m2  SpO2 97% VS noted,  Constitutional: Pt appears in no significant distress HENT:  Head: NCAT.  Right Ear: External ear normal.  Left Ear: External ear normal.  Eyes: . Pupils are equal, round, and reactive to light. Conjunctivae and EOM are normal Neck: Normal range of motion. Neck supple.  Cardiovascular: Normal rate and regular rhythm.   Pulmonary/Chest: Effort normal and breath sounds without rales or wheezing.  Abd:  Soft, NT, ND, + BS Neurological: Pt is alert. Not confused , motor grossly intact Skin: Skin is warm. No rash, no LE edema Psychiatric: Pt behavior is normal. No agitation.     Assessment & Plan:

## 2015-03-31 NOTE — Patient Instructions (Addendum)
You had the flu shot today  Please make a Nurse Visit appointment at your convenience on or after 2 wks from today  Please continue all other medications as before, and refills have been done if requested - the cogentin  Please have the pharmacy call with any other refills you may need.  Please continue your efforts at being more active, low cholesterol diet, and weight control.  You will be contacted regarding the referral for: pulmonary for after Nov 3 per your request  Please keep your appointments with your specialists as you may have planned   Please go to the LAB in the Basement (turn left off the elevator) for the tests to be done at your convenience on or after 8 wks from today - the thyroid testing  Please return in 4 months, or sooner if needed

## 2015-04-01 DIAGNOSIS — J690 Pneumonitis due to inhalation of food and vomit: Secondary | ICD-10-CM | POA: Insufficient documentation

## 2015-04-01 DIAGNOSIS — R7989 Other specified abnormal findings of blood chemistry: Secondary | ICD-10-CM | POA: Insufficient documentation

## 2015-04-01 NOTE — Assessment & Plan Note (Signed)
Resolved clinically, will need f/u CT I suspect for pulm nodule found on ct with last hospn at 3 mo, refer pulm after nov 3 per pt request, for flu shot today

## 2015-04-01 NOTE — Assessment & Plan Note (Signed)
?   Recent sick euthyroid, no change in med tx for now, for repeat TSH, free t4 at 8 wks

## 2015-04-01 NOTE — Assessment & Plan Note (Signed)
stable overall by history and exam, recent data reviewed with pt, and pt to continue medical treatment as before,  to f/u any worsening symptoms or concerns SpO2 Readings from Last 3 Encounters:  03/31/15 97%  10/04/14 98%  03/29/14 92%

## 2015-04-29 ENCOUNTER — Other Ambulatory Visit: Payer: Self-pay | Admitting: Internal Medicine

## 2015-05-01 ENCOUNTER — Ambulatory Visit (INDEPENDENT_AMBULATORY_CARE_PROVIDER_SITE_OTHER): Payer: Medicare Other

## 2015-05-01 DIAGNOSIS — Z23 Encounter for immunization: Secondary | ICD-10-CM

## 2015-05-18 DIAGNOSIS — Z79891 Long term (current) use of opiate analgesic: Secondary | ICD-10-CM | POA: Diagnosis not present

## 2015-05-18 DIAGNOSIS — M961 Postlaminectomy syndrome, not elsewhere classified: Secondary | ICD-10-CM | POA: Diagnosis not present

## 2015-05-18 DIAGNOSIS — M542 Cervicalgia: Secondary | ICD-10-CM | POA: Diagnosis not present

## 2015-05-18 DIAGNOSIS — M4726 Other spondylosis with radiculopathy, lumbar region: Secondary | ICD-10-CM | POA: Diagnosis not present

## 2015-05-18 DIAGNOSIS — M5136 Other intervertebral disc degeneration, lumbar region: Secondary | ICD-10-CM | POA: Diagnosis not present

## 2015-06-26 DIAGNOSIS — M1712 Unilateral primary osteoarthritis, left knee: Secondary | ICD-10-CM | POA: Diagnosis not present

## 2015-06-26 DIAGNOSIS — M25561 Pain in right knee: Secondary | ICD-10-CM | POA: Diagnosis not present

## 2015-06-26 DIAGNOSIS — M1711 Unilateral primary osteoarthritis, right knee: Secondary | ICD-10-CM | POA: Diagnosis not present

## 2015-06-26 DIAGNOSIS — M25562 Pain in left knee: Secondary | ICD-10-CM | POA: Diagnosis not present

## 2015-07-13 ENCOUNTER — Telehealth: Payer: Self-pay | Admitting: *Deleted

## 2015-07-13 DIAGNOSIS — M5136 Other intervertebral disc degeneration, lumbar region: Secondary | ICD-10-CM | POA: Diagnosis not present

## 2015-07-13 DIAGNOSIS — M4726 Other spondylosis with radiculopathy, lumbar region: Secondary | ICD-10-CM | POA: Diagnosis not present

## 2015-07-13 DIAGNOSIS — Z79891 Long term (current) use of opiate analgesic: Secondary | ICD-10-CM | POA: Diagnosis not present

## 2015-07-13 DIAGNOSIS — M961 Postlaminectomy syndrome, not elsewhere classified: Secondary | ICD-10-CM | POA: Diagnosis not present

## 2015-07-13 DIAGNOSIS — M542 Cervicalgia: Secondary | ICD-10-CM | POA: Diagnosis not present

## 2015-07-13 MED ORDER — OLANZAPINE 15 MG PO TABS: 15.0000 mg | ORAL_TABLET | Freq: Every day | ORAL | Status: DC

## 2015-07-13 NOTE — Telephone Encounter (Signed)
Received call pt states she is needing a refill on her Zyprexa 15 mg med was rx by another md, but they willnot refill it because she has a balance , and she is not able to pay at this time...Johny Chess

## 2015-07-13 NOTE — Telephone Encounter (Signed)
I can provide one month tx, after which I would have to insist she see the originally prescribing MD

## 2015-07-13 NOTE — Telephone Encounter (Signed)
Notified pt with md response. Rx already sent to pharmacy...[name]

## 2015-07-31 ENCOUNTER — Ambulatory Visit: Payer: Medicare Other | Admitting: Internal Medicine

## 2015-08-01 ENCOUNTER — Ambulatory Visit (INDEPENDENT_AMBULATORY_CARE_PROVIDER_SITE_OTHER): Payer: Medicare Other | Admitting: Internal Medicine

## 2015-08-01 ENCOUNTER — Encounter: Payer: Self-pay | Admitting: Internal Medicine

## 2015-08-01 ENCOUNTER — Other Ambulatory Visit (INDEPENDENT_AMBULATORY_CARE_PROVIDER_SITE_OTHER): Payer: Medicare Other

## 2015-08-01 ENCOUNTER — Ambulatory Visit (INDEPENDENT_AMBULATORY_CARE_PROVIDER_SITE_OTHER)
Admission: RE | Admit: 2015-08-01 | Discharge: 2015-08-01 | Disposition: A | Discharge status: Home / Self Care | Payer: Medicare Other | Source: Ambulatory Visit | Attending: Internal Medicine | Admitting: Internal Medicine

## 2015-08-01 VITALS — BP 108/68 | HR 91 | Temp 98.5°F | Ht 65.0 in | Wt 146.2 lb

## 2015-08-01 DIAGNOSIS — R05 Cough: Secondary | ICD-10-CM | POA: Diagnosis not present

## 2015-08-01 DIAGNOSIS — Z0000 Encounter for general adult medical examination without abnormal findings: Secondary | ICD-10-CM | POA: Diagnosis not present

## 2015-08-01 DIAGNOSIS — J441 Chronic obstructive pulmonary disease with (acute) exacerbation: Secondary | ICD-10-CM

## 2015-08-01 DIAGNOSIS — F32A Depression, unspecified: Secondary | ICD-10-CM

## 2015-08-01 DIAGNOSIS — R0602 Shortness of breath: Secondary | ICD-10-CM | POA: Diagnosis not present

## 2015-08-01 DIAGNOSIS — J019 Acute sinusitis, unspecified: Secondary | ICD-10-CM | POA: Diagnosis not present

## 2015-08-01 DIAGNOSIS — F329 Major depressive disorder, single episode, unspecified: Secondary | ICD-10-CM

## 2015-08-01 LAB — CBC WITH DIFFERENTIAL/PLATELET
BASOS ABS: 0 10*3/uL (ref 0.0–0.1)
BASOS PCT: 0.4 % (ref 0.0–3.0)
Eosinophils Absolute: 0.1 10*3/uL (ref 0.0–0.7)
Eosinophils Relative: 0.9 % (ref 0.0–5.0)
HEMATOCRIT: 48.4 % — AB (ref 36.0–46.0)
HEMOGLOBIN: 16.3 g/dL — AB (ref 12.0–15.0)
LYMPHS PCT: 24.8 % (ref 12.0–46.0)
Lymphs Abs: 2.4 10*3/uL (ref 0.7–4.0)
MCHC: 33.7 g/dL (ref 30.0–36.0)
MCV: 92.4 fl (ref 78.0–100.0)
MONO ABS: 0.8 10*3/uL (ref 0.1–1.0)
Monocytes Relative: 8.6 % (ref 3.0–12.0)
NEUTROS PCT: 65.3 % (ref 43.0–77.0)
Neutro Abs: 6.3 10*3/uL (ref 1.4–7.7)
Platelets: 324 10*3/uL (ref 150.0–400.0)
RBC: 5.23 Mil/uL — ABNORMAL HIGH (ref 3.87–5.11)
RDW: 14.8 % (ref 11.5–15.5)
WBC: 9.7 10*3/uL (ref 4.0–10.5)

## 2015-08-01 LAB — HEPATIC FUNCTION PANEL
ALBUMIN: 4 g/dL (ref 3.5–5.2)
ALK PHOS: 98 U/L (ref 39–117)
ALT: 11 U/L (ref 0–35)
AST: 11 U/L (ref 0–37)
Bilirubin, Direct: 0 mg/dL (ref 0.0–0.3)
Total Bilirubin: 0.3 mg/dL (ref 0.2–1.2)
Total Protein: 7 g/dL (ref 6.0–8.3)

## 2015-08-01 LAB — URINALYSIS, ROUTINE W REFLEX MICROSCOPIC
Bilirubin Urine: NEGATIVE
Ketones, ur: NEGATIVE
Leukocytes, UA: NEGATIVE
Nitrite: NEGATIVE
Specific Gravity, Urine: 1.015 (ref 1.000–1.030)
TOTAL PROTEIN, URINE-UPE24: NEGATIVE
UROBILINOGEN UA: 0.2 (ref 0.0–1.0)
Urine Glucose: NEGATIVE
pH: 6 (ref 5.0–8.0)

## 2015-08-01 LAB — BASIC METABOLIC PANEL
BUN: 6 mg/dL (ref 6–23)
CHLORIDE: 104 meq/L (ref 96–112)
CO2: 29 mEq/L (ref 19–32)
Calcium: 9.7 mg/dL (ref 8.4–10.5)
Creatinine, Ser: 0.78 mg/dL (ref 0.40–1.20)
GFR: 80.81 mL/min (ref >60.00)
Glucose, Bld: 101 mg/dL — ABNORMAL HIGH (ref 70–99)
Potassium: 3.4 mEq/L — ABNORMAL LOW (ref 3.5–5.1)
Sodium: 140 mEq/L (ref 135–145)

## 2015-08-01 LAB — LIPID PANEL
CHOL/HDL RATIO: 4
CHOLESTEROL: 156 mg/dL (ref 0–200)
HDL: 36.6 mg/dL — AB (ref >39.00)
LDL CALC: 93 mg/dL (ref 0–99)
NonHDL: 119.53
TRIGLYCERIDES: 135 mg/dL (ref 0.0–149.0)
VLDL: 27 mg/dL (ref 0.0–40.0)

## 2015-08-01 LAB — TSH: TSH: 0.73 u[IU]/mL (ref 0.35–4.50)

## 2015-08-01 MED ORDER — PROMETHAZINE HCL 12.5 MG PO TABS: ORAL_TABLET | ORAL | Status: DC

## 2015-08-01 MED ORDER — HYDROCODONE-HOMATROPINE 5-1.5 MG/5ML PO SYRP: 5.0000 mL | ORAL_SOLUTION | Freq: Four times a day (QID) | ORAL | Status: DC | PRN

## 2015-08-01 MED ORDER — LEVOFLOXACIN 500 MG PO TABS: 500.0000 mg | ORAL_TABLET | Freq: Every day | ORAL | Status: DC

## 2015-08-01 MED ORDER — PREDNISONE 10 MG PO TABS: ORAL_TABLET | ORAL | Status: DC

## 2015-08-01 NOTE — Progress Notes (Signed)
Pre visit review using our clinic review tool, if applicable. No additional management support is needed unless otherwise documented below in the visit note. 

## 2015-08-01 NOTE — Assessment & Plan Note (Signed)
stable overall by history and exam, recent data reviewed with pt, and pt to continue medical treatment as before,  to f/u any worsening symptoms or concerns Lab Results  Component Value Date   WBC 8.5 10/04/2014   HGB 16.1* 10/04/2014   HCT 46.7* 10/04/2014   PLT 240.0 10/04/2014   GLUCOSE 95 10/04/2014   CHOL 200 10/04/2014   TRIG 188.0* 10/04/2014   HDL 35.80* 10/04/2014   LDLDIRECT 102.7 08/21/2011   LDLCALC 127* 10/04/2014   ALT 11 10/04/2014   AST 11 10/04/2014   NA 134* 10/04/2014   K 3.9 10/04/2014   CL 101 10/04/2014   CREATININE 1.03 10/04/2014   BUN 6 10/04/2014   CO2 29 10/04/2014   TSH 1.89 10/04/2014   INR 1.03 05/03/2013   HGBA1C 5.5 08/24/2009

## 2015-08-01 NOTE — Patient Instructions (Signed)
Please take all new medication as prescribed- the antibiotic, cough medicine, and prednisone  Please continue all other medications as before, and refills have been done if requested.  Please have the pharmacy call with any other refills you may need.  Please continue your efforts at being more active, low cholesterol diet, and weight control.  You are otherwise up to date with prevention measures today.  Please keep your appointments with your specialists as you may have planned  Please go to the XRAY Department in the Basement (go straight as you get off the elevator) for the x-ray testing  Please go to the LAB in the Basement (turn left off the elevator) for the tests to be done today  You will be contacted by phone if any changes need to be made immediately.  Otherwise, you will receive a letter about your results with an explanation, but please check with MyChart first.  Please remember to sign up for MyChart if you have not done so, as this will be important to you in the future with finding out test results, communicating by private email, and scheduling acute appointments online when needed.  Please return in 6 months, or sooner if needed

## 2015-08-01 NOTE — Assessment & Plan Note (Signed)

## 2015-08-01 NOTE — Assessment & Plan Note (Signed)
Mild, for predpac asd,  to f/u any worsening symptoms or concerns 

## 2015-08-01 NOTE — Assessment & Plan Note (Signed)
Mild to mod, for antibx course,  to f/u any worsening symptoms or concerns 

## 2015-08-01 NOTE — Progress Notes (Signed)
Subjective:    Patient ID: [name], female    DOB: 02-09-1956, 60 y.o.   MRN: [id number]  HPI Here for wellness and f/u;  Overall doing ok;  Pt denies Chest pain, worsening SOB, DOE, wheezing, orthopnea, PND, worsening LE edema, palpitations, dizziness or syncope.  Pt denies neurological change such as new headache, facial or extremity weakness.  Pt denies polydipsia, polyuria, or low sugar symptoms. Pt states overall good compliance with treatment and medications, good tolerability, and has been trying to follow appropriate diet.  Pt denies worsening depressive symptoms, suicidal ideation or panic. No fever, night sweats, wt loss, loss of appetite, or other constitutional symptoms.  Pt states good ability with ADL's, has low fall risk, home safety reviewed and adequate, no other significant changes in hearing or vision, and only occasionally active with exercise.  Incidentally -  Here with 2-3 days acute onset fever, facial pain, pressure, headache, general weakness and malaise, and greenish d/c, with mild ST and cough, but pt denies chest pain, wheezing, increased sob or doe, orthopnea, PND, increased LE swelling, palpitations, dizziness or syncope, except for onset nonprod cough and mild wheeze/sob last pm Past Medical History  Diagnosis Date  . Depression     bipolar  . OCD (obsessive compulsive disorder)     anxiety  . Migraines   . Brain aneurysm   . Chronic abdominal pain   . History of colonic polyps   . Peripheral neuropathy ([place])   . Fibromyalgia   . HLD (hyperlipidemia)   . GERD (gastroesophageal reflux disease)   . IBS (irritable bowel syndrome)     chronic constipation  . Allergic rhinitis   . Gastroparesis   . COPD (chronic obstructive pulmonary disease) ([place])     denies  . H/O hiatal hernia   . Arthritis    Past Surgical History  Procedure Laterality Date  . Appendectomy    . Cholecystectomy    . Vesicovaginal fistula closure w/ tah    . Oophorectomy    .  Coil to brain aneurysm    . Shoulder surgery Left   . Elbow surgery Bilateral     tendonitis  . Abdominal hysterectomy    . Cervical disc surgery    . Colonoscopy    . Tubal ligation    . Radiology with anesthesia N/A 05/10/2013    Procedure: RADIOLOGY WITH ANESTHESIA;  Surgeon: Rob Hickman, MD;  Location: [facility];  Service: Radiology;  Laterality: N/A;    reports that she has been smoking Cigarettes.  She has a 39 pack-year smoking history. She has never used smokeless tobacco. She reports that she does not drink alcohol or use illicit drugs. family history includes Cancer in her father, mother, and sister; Leukemia in her sister. There is no history of Colon cancer. Allergies  Allergen Reactions  . Ambien [Zolpidem Tartrate] Other (See Comments)    Memory issues  . Hydrocodone Nausea Only    Can take it if its in a cough syrup   . Nsaids     REACTION: GI irritation  . Propoxyphene N-Acetaminophen Itching  . Tramadol     REACTION: nausea  . Varenicline Tartrate     REACTION: agitation, depression, sick to stomach  . Chantix [Varenicline] Other (See Comments)    "Makes bipolar worse"  . Other Other (See Comments)    Lactose intolerant   Current Outpatient Prescriptions on File Prior to Visit  Medication Sig Dispense Refill  . albuterol (PROVENTIL HFA;VENTOLIN  HFA) 108 (90 BASE) MCG/ACT inhaler Inhale 2 puffs into the lungs every 6 (six) hours as needed for wheezing or shortness of breath. 1 Inhaler 11  . aspirin 325 MG tablet Take 325 mg by mouth daily.    Marland Kitchen atorvastatin (LIPITOR) 40 MG tablet Take 1 tablet by mouth  daily 90 tablet 1  . benztropine (COGENTIN) 1 MG tablet Take 1 tablet (1 mg total) by mouth 2 (two) times daily. 90 tablet 3  . Cholecalciferol (VITAMIN D3) 1000 UNITS CAPS Take 1,000 Units by mouth daily.     . cyanocobalamin 1000 MCG tablet Take 1,000 mcg by mouth daily.     . DULoxetine (CYMBALTA) 20 MG capsule Take 60 mg by mouth daily.     . famotidine  (PEPCID) 20 MG tablet Take 1 tablet (20 mg total) by mouth daily. 90 tablet 3  . gabapentin (NEURONTIN) 300 MG capsule Take 300 mg by mouth.    . lamoTRIgine (LAMICTAL) 200 MG tablet Take 1 tablet (200 mg total) by mouth daily. 90 tablet 1  . morphine (MS CONTIN) 30 MG 12 hr tablet Take 30 mg by mouth.    . OLANZapine (ZYPREXA) 15 MG tablet Take 1 tablet (15 mg total) by mouth at bedtime. 30 tablet 0  . omeprazole (PRILOSEC) 40 MG capsule Take 40 mg by mouth.    . oxyCODONE (ROXICODONE) 15 MG immediate release tablet Take 15 mg by mouth 4 (four) times daily as needed for pain.    . potassium chloride (K-DUR) 10 MEQ tablet TAKE ONE TABLET BY MOUTH EVERY DAY 30 tablet 11   No current facility-administered medications on file prior to visit.   Review of Systems Constitutional: Negative for increased diaphoresis, other activity, appetite or siginficant weight change other than noted HENT: Negative for worsening hearing loss, ear pain, facial swelling, mouth sores and neck stiffness.   Eyes: Negative for other worsening pain, redness or visual disturbance.  Respiratory: Negative for shortness of breath and wheezing  Cardiovascular: Negative for chest pain and palpitations.  Gastrointestinal: Negative for diarrhea, blood in stool, abdominal distention or other pain Genitourinary: Negative for hematuria, flank pain or change in urine volume.  Musculoskeletal: Negative for myalgias or other joint complaints.  Skin: Negative for color change and wound or drainage.  Neurological: Negative for syncope and numbness. other than noted Hematological: Negative for adenopathy. or other swelling Psychiatric/Behavioral: Negative for hallucinations, SI, self-injury, decreased concentration or other worsening agitation.      Objective:   Physical Exam BP 108/68 mmHg  Pulse 91  Temp(Src) 98.5 F (36.9 C) (Oral)  Ht 5\' 5"  (1.651 m)  Wt 146 lb 4 oz (66.339 kg)  BMI 24.34 kg/m2  SpO2 95% VS noted, mild  ill Constitutional: Pt is oriented to person, place, and time. Appears well-developed and well-nourished, in no significant distress Head: Normocephalic and atraumatic.  Right Ear: External ear normal.  Left Ear: External ear normal.  Nose: Nose normal. Bilat tm's with mild erythema.  Max sinus areas mild tender.  Pharynx with mild erythema, no exudate Mouth/Throat: Oropharynx is clear and moist.  Eyes: Conjunctivae and EOM are normal. Pupils are equal, round, and reactive to light.  Neck: Normal range of motion. Neck supple. No JVD present. No tracheal deviation present or significant neck LA or mass Cardiovascular: Normal rate, regular rhythm, normal heart sounds and intact distal pulses.   Pulmonary/Chest: Effort normal and breath sounds decresed without rales but with mild scattered wheezing  Abdominal: Soft. Bowel sounds  are normal. NT. No HSM  Musculoskeletal: Normal range of motion. Exhibits no edema.  Lymphadenopathy:  Has no cervical adenopathy.  Neurological: Pt is alert and oriented to person, place, and time. Pt has normal reflexes. No cranial nerve deficit. Motor grossly intact Skin: Skin is warm and dry. No rash noted.  Psychiatric:  Has mild nervous mood and affect. Behavior is normal. not depressed affect     Assessment & Plan:

## 2015-08-02 LAB — HEPATITIS C ANTIBODY: HCV AB: NEGATIVE

## 2015-08-09 ENCOUNTER — Telehealth: Payer: Self-pay | Admitting: Internal Medicine

## 2015-08-09 DIAGNOSIS — G894 Chronic pain syndrome: Secondary | ICD-10-CM | POA: Diagnosis not present

## 2015-08-09 DIAGNOSIS — M25512 Pain in left shoulder: Secondary | ICD-10-CM | POA: Diagnosis not present

## 2015-08-09 DIAGNOSIS — M545 Low back pain: Secondary | ICD-10-CM | POA: Diagnosis not present

## 2015-08-09 DIAGNOSIS — Z79891 Long term (current) use of opiate analgesic: Secondary | ICD-10-CM | POA: Diagnosis not present

## 2015-08-09 DIAGNOSIS — M25552 Pain in left hip: Secondary | ICD-10-CM | POA: Diagnosis not present

## 2015-08-09 DIAGNOSIS — M75102 Unspecified rotator cuff tear or rupture of left shoulder, not specified as traumatic: Secondary | ICD-10-CM | POA: Diagnosis not present

## 2015-08-09 DIAGNOSIS — M25562 Pain in left knee: Secondary | ICD-10-CM | POA: Diagnosis not present

## 2015-08-09 DIAGNOSIS — E7800 Pure hypercholesterolemia, unspecified: Secondary | ICD-10-CM | POA: Diagnosis not present

## 2015-08-09 DIAGNOSIS — K219 Gastro-esophageal reflux disease without esophagitis: Secondary | ICD-10-CM | POA: Diagnosis not present

## 2015-08-09 DIAGNOSIS — Z95828 Presence of other vascular implants and grafts: Secondary | ICD-10-CM | POA: Diagnosis not present

## 2015-08-09 DIAGNOSIS — Z7982 Long term (current) use of aspirin: Secondary | ICD-10-CM | POA: Diagnosis not present

## 2015-08-09 DIAGNOSIS — Z885 Allergy status to narcotic agent status: Secondary | ICD-10-CM | POA: Diagnosis not present

## 2015-08-09 DIAGNOSIS — M25561 Pain in right knee: Secondary | ICD-10-CM | POA: Diagnosis not present

## 2015-08-09 DIAGNOSIS — M25511 Pain in right shoulder: Secondary | ICD-10-CM | POA: Diagnosis not present

## 2015-08-09 DIAGNOSIS — Z79899 Other long term (current) drug therapy: Secondary | ICD-10-CM | POA: Diagnosis not present

## 2015-08-09 DIAGNOSIS — J449 Chronic obstructive pulmonary disease, unspecified: Secondary | ICD-10-CM | POA: Diagnosis not present

## 2015-08-09 DIAGNOSIS — M542 Cervicalgia: Secondary | ICD-10-CM | POA: Diagnosis not present

## 2015-08-09 DIAGNOSIS — M5136 Other intervertebral disc degeneration, lumbar region: Secondary | ICD-10-CM | POA: Diagnosis not present

## 2015-08-09 DIAGNOSIS — G43909 Migraine, unspecified, not intractable, without status migrainosus: Secondary | ICD-10-CM | POA: Diagnosis not present

## 2015-08-09 DIAGNOSIS — Z888 Allergy status to other drugs, medicaments and biological substances status: Secondary | ICD-10-CM | POA: Diagnosis not present

## 2015-08-09 NOTE — Telephone Encounter (Signed)
Patient Name: [name]  DOB: 12/15/1955    Initial Comment Caller states having a lot of pain in arms and legs, neck and between shoulder blades hurting also   Nurse Assessment  Nurse: Mallie Mussel, RN, Alveta Heimlich Date/Time (Eastern Time): 08/09/2015 10:01:07 AM  Confirm and document reason for call. If symptomatic, describe symptoms. You must click the next button to save text entered. ---Caller states that she has bilateral arm and bilateral leg pain which began a week ago. She states that she also has neck and pain in between her shoulders. She rates her pain as 9 on 0-10 scale and her arms hurt the worst. She denies injury. She is having some trouble walking. She denies fever. She denies red skin.  Has the patient traveled out of the country within the last 30 days? ---No  Does the patient have any new or worsening symptoms? ---Yes  Will a triage be completed? ---Yes  Related visit to physician within the last 2 weeks? ---No  Does the PT have any chronic conditions? (i.e. diabetes, asthma, etc.) ---Yes  List chronic conditions. ---Hypercholesterolemia, Brain Aneurysms with Stents, Bi-Polar, Andidepressant, GERD  Is this a behavioral health or substance abuse call? ---No     Guidelines    Guideline Title Affirmed Question Affirmed Notes  Arm Pain [1] Age > 40 AND [2] no obvious cause AND [3] pain even when not moving the arm (Exception: pain is clearly made worse by moving arm or bending neck)    Final Disposition User   Go to ED Now Mallie Mussel, RN, [place]    Referrals  GO TO FACILITY OTHER - SPECIFY   Disagree/Comply: Comply

## 2015-08-15 DIAGNOSIS — K581 Irritable bowel syndrome with constipation: Secondary | ICD-10-CM | POA: Diagnosis not present

## 2015-08-21 DIAGNOSIS — M7581 Other shoulder lesions, right shoulder: Secondary | ICD-10-CM | POA: Diagnosis not present

## 2015-08-21 DIAGNOSIS — M25511 Pain in right shoulder: Secondary | ICD-10-CM | POA: Diagnosis not present

## 2015-09-07 DIAGNOSIS — Z79891 Long term (current) use of opiate analgesic: Secondary | ICD-10-CM | POA: Diagnosis not present

## 2015-09-07 DIAGNOSIS — M542 Cervicalgia: Secondary | ICD-10-CM | POA: Diagnosis not present

## 2015-09-07 DIAGNOSIS — M4726 Other spondylosis with radiculopathy, lumbar region: Secondary | ICD-10-CM | POA: Diagnosis not present

## 2015-09-07 DIAGNOSIS — M5136 Other intervertebral disc degeneration, lumbar region: Secondary | ICD-10-CM | POA: Diagnosis not present

## 2015-09-07 DIAGNOSIS — M961 Postlaminectomy syndrome, not elsewhere classified: Secondary | ICD-10-CM | POA: Diagnosis not present

## 2015-09-14 DIAGNOSIS — H53453 Other localized visual field defect, bilateral: Secondary | ICD-10-CM | POA: Diagnosis not present

## 2015-10-03 ENCOUNTER — Other Ambulatory Visit: Payer: Self-pay | Admitting: Internal Medicine

## 2015-10-04 ENCOUNTER — Other Ambulatory Visit: Payer: Self-pay | Admitting: Internal Medicine

## 2015-10-26 ENCOUNTER — Encounter: Payer: Self-pay | Admitting: Internal Medicine

## 2015-10-26 ENCOUNTER — Ambulatory Visit (INDEPENDENT_AMBULATORY_CARE_PROVIDER_SITE_OTHER): Payer: Medicare Other | Admitting: Internal Medicine

## 2015-10-26 ENCOUNTER — Telehealth: Payer: Self-pay | Admitting: Internal Medicine

## 2015-10-26 VITALS — BP 124/78 | HR 87 | Resp 20 | Wt 152.0 lb

## 2015-10-26 DIAGNOSIS — F329 Major depressive disorder, single episode, unspecified: Secondary | ICD-10-CM

## 2015-10-26 DIAGNOSIS — J309 Allergic rhinitis, unspecified: Secondary | ICD-10-CM | POA: Diagnosis not present

## 2015-10-26 DIAGNOSIS — F32A Depression, unspecified: Secondary | ICD-10-CM

## 2015-10-26 DIAGNOSIS — J438 Other emphysema: Secondary | ICD-10-CM | POA: Diagnosis not present

## 2015-10-26 DIAGNOSIS — L03119 Cellulitis of unspecified part of limb: Secondary | ICD-10-CM

## 2015-10-26 MED ORDER — PREDNISONE 10 MG PO TABS: ORAL_TABLET | ORAL | Status: DC

## 2015-10-26 MED ORDER — DOXYCYCLINE HYCLATE 100 MG PO TABS: 100.0000 mg | ORAL_TABLET | Freq: Two times a day (BID) | ORAL | Status: DC

## 2015-10-26 MED ORDER — FLUCONAZOLE 150 MG PO TABS: ORAL_TABLET | ORAL | Status: DC

## 2015-10-26 MED ORDER — TRIAMCINOLONE ACETONIDE 55 MCG/ACT NA AERO: 2.0000 | INHALATION_SPRAY | Freq: Every day | NASAL | Status: DC

## 2015-10-26 MED ORDER — CETIRIZINE HCL 10 MG PO TABS: 10.0000 mg | ORAL_TABLET | Freq: Every day | ORAL | Status: DC

## 2015-10-26 NOTE — Assessment & Plan Note (Signed)
Mild to mod, for antibx course,  to f/u any worsening symptoms or concerns 

## 2015-10-26 NOTE — Progress Notes (Signed)
Pre visit review using our clinic review tool, if applicable. No additional management support is needed unless otherwise documented below in the visit note. 

## 2015-10-26 NOTE — Progress Notes (Signed)
Subjective:    Patient ID: [name], female    DOB: 06-07-1956, 60 y.o.   MRN: [id number]  HPI  Here to f/u with c/o 1 wk worsening red/tender/swellng to scabbed type lesion (she's not sure how occurred) at the tip of the elbow, with a red streak heading more distal to the elbow as well.  No f/c, n/v, and Pt denies chest pain, increased sob or doe, wheezing, orthopnea, PND, increased LE swelling, palpitations, dizziness or syncope.  Does have several wks ongoing nasal allergy symptoms with clearish congestion, marked cough mostly non prod worse to lying down at night, with itch and sneezing, but without fever, pain, ST, swelling or wheezing.  Pt denies polydipsia, polyuria.  Denies worsening depressive symptoms, suicidal ideation, or panic; has ongoing anxiety, not increased recently.  Past Medical History  Diagnosis Date  . Depression     bipolar  . OCD (obsessive compulsive disorder)     anxiety  . Migraines   . Brain aneurysm   . Chronic abdominal pain   . History of colonic polyps   . Peripheral neuropathy ([place])   . Fibromyalgia   . HLD (hyperlipidemia)   . GERD (gastroesophageal reflux disease)   . IBS (irritable bowel syndrome)     chronic constipation  . Allergic rhinitis   . Gastroparesis   . COPD (chronic obstructive pulmonary disease) ([place])     denies  . H/O hiatal hernia   . Arthritis    Past Surgical History  Procedure Laterality Date  . Appendectomy    . Cholecystectomy    . Vesicovaginal fistula closure w/ tah    . Oophorectomy    . Coil to brain aneurysm    . Shoulder surgery Left   . Elbow surgery Bilateral     tendonitis  . Abdominal hysterectomy    . Cervical disc surgery    . Colonoscopy    . Tubal ligation    . Radiology with anesthesia N/A 05/10/2013    Procedure: RADIOLOGY WITH ANESTHESIA;  Surgeon: Rob Hickman, MD;  Location: [facility];  Service: Radiology;  Laterality: N/A;    reports that she has been smoking Cigarettes.  She has a 39  pack-year smoking history. She has never used smokeless tobacco. She reports that she does not drink alcohol or use illicit drugs. family history includes Cancer in her father, mother, and sister; Leukemia in her sister. There is no history of Colon cancer. Allergies  Allergen Reactions  . Ambien [Zolpidem Tartrate] Other (See Comments)    Memory issues  . Hydrocodone Nausea Only    Can take it if its in a cough syrup   . Nsaids     REACTION: GI irritation  . Propoxyphene N-Acetaminophen Itching  . Tramadol     REACTION: nausea  . Varenicline Tartrate     REACTION: agitation, depression, sick to stomach  . Chantix [Varenicline] Other (See Comments)    "Makes bipolar worse"  . Other Other (See Comments)    Lactose intolerant   Current Outpatient Prescriptions on File Prior to Visit  Medication Sig Dispense Refill  . albuterol (PROVENTIL HFA;VENTOLIN HFA) 108 (90 BASE) MCG/ACT inhaler Inhale 2 puffs into the lungs every 6 (six) hours as needed for wheezing or shortness of breath. 1 Inhaler 11  . aspirin 325 MG tablet Take 325 mg by mouth daily.    Marland Kitchen atorvastatin (LIPITOR) 40 MG tablet Take 1 tablet by mouth  daily 90 tablet 0  . benztropine (  COGENTIN) 1 MG tablet Take 1 tablet (1 mg total) by mouth 2 (two) times daily. 90 tablet 3  . Cholecalciferol (VITAMIN D3) 1000 UNITS CAPS Take 1,000 Units by mouth daily.     . cyanocobalamin 1000 MCG tablet Take 1,000 mcg by mouth daily.     . DULoxetine (CYMBALTA) 60 MG capsule TAKE ONE CAPSULE EVERY DAY 30 capsule 0  . famotidine (PEPCID) 20 MG tablet Take 1 tablet (20 mg total) by mouth daily. 90 tablet 3  . gabapentin (NEURONTIN) 300 MG capsule Take 300 mg by mouth.    . lamoTRIgine (LAMICTAL) 200 MG tablet Take 1 tablet (200 mg total) by mouth daily. 90 tablet 1  . morphine (MS CONTIN) 30 MG 12 hr tablet Take 30 mg by mouth.    . OLANZapine (ZYPREXA) 15 MG tablet Take 1 tablet by mouth at  bedtime 30 tablet 0  . omeprazole (PRILOSEC) 40 MG  capsule Take 40 mg by mouth.    . oxyCODONE (ROXICODONE) 15 MG immediate release tablet Take 15 mg by mouth 4 (four) times daily as needed for pain.    . potassium chloride (K-DUR) 10 MEQ tablet TAKE ONE TABLET BY MOUTH EVERY DAY 30 tablet 11  . promethazine (PHENERGAN) 12.5 MG tablet Take 1 tablet by mouth  every 6 hours as needed for nausea 90 tablet 0   No current facility-administered medications on file prior to visit.   Review of Systems  Constitutional: Negative for unusual diaphoresis or night sweats HENT: Negative for ear swelling or discharge Eyes: Negative for worsening visual haziness  Respiratory: Negative for choking and stridor.   Gastrointestinal: Negative for distension or worsening eructation Genitourinary: Negative for retention or change in urine volume.  Musculoskeletal: Negative for other MSK pain or swelling Skin: Negative for color change and worsening wound Neurological: Negative for tremors and numbness other than noted  Psychiatric/Behavioral: Negative for decreased concentration or agitation other than above       Objective:   Physical Exam BP 124/78 mmHg  Pulse 87  Resp 20  Wt 152 lb (68.947 kg)  SpO2 96% VS noted,  Constitutional: Pt appears in no apparent distress HENT: Head: NCAT.  Right Ear: External ear normal.  Left Ear: External ear normal.  Bilat tm's with mild erythema.  Max sinus areas mild tender.  Pharynx with mild erythema, no exudate Eyes: . Pupils are equal, round, and reactive to light. Conjunctivae and EOM are normal Neck: Normal range of motion. Neck supple.  Cardiovascular: Normal rate and regular rhythm.   Pulmonary/Chest: Effort normal and breath sounds without rales or wheezing.  Neurological: Pt is alert. Not confused , motor grossly intact Skin: Skin is warm. No rash, no LE edema, left elbow with 2 cm area mild red/tende/swelling with central scabbing,, but nonfluctuant, no drainage, but does have a red streak mild tender from  the area more distal to about mid post arm Psychiatric: Pt behavior is normal. No agitation.  not depressed affect or overly nervous today    Assessment & Plan:

## 2015-10-26 NOTE — Assessment & Plan Note (Signed)
Mild to mod, with seasonal flare with cough worse at night liekly related to post nasal gtt, for depomedrol IM, predpac asd, then zyrtec/nasacort asd,   to f/u any worsening symptoms or concerns

## 2015-10-26 NOTE — Telephone Encounter (Signed)
Patient states that scripts were sent to incorrect pharmacy.  States they need to go to [facility].

## 2015-10-26 NOTE — Telephone Encounter (Signed)
Medications sent to new pharmacy 

## 2015-10-26 NOTE — Patient Instructions (Signed)
You had the steroid shot today  Please take all new medication as prescribed - the antibiotic, prednisone, and zyrtec/nasacort  Please continue all other medications as before, and refills have been done if requested.  Please have the pharmacy call with any other refills you may need.  Please keep your appointments with your specialists as you may have planned

## 2015-10-26 NOTE — Assessment & Plan Note (Signed)
SpO2 Readings from Last 3 Encounters:  10/26/15 96%  08/01/15 95%  03/31/15 97%   stable overall by history and exam, recent data reviewed with pt, and pt to continue medical treatment as before,  to f/u any worsening symptoms or concerns

## 2015-10-26 NOTE — Assessment & Plan Note (Signed)
stable overall by history and exam, recent data reviewed with pt, and pt to continue medical treatment as before,  to f/u any worsening symptoms or concerns Lab Results  Component Value Date   WBC 9.7 08/01/2015   HGB 16.3* 08/01/2015   HCT 48.4* 08/01/2015   PLT 324.0 08/01/2015   GLUCOSE 101* 08/01/2015   CHOL 156 08/01/2015   TRIG 135.0 08/01/2015   HDL 36.60* 08/01/2015   LDLDIRECT 102.7 08/21/2011   LDLCALC 93 08/01/2015   ALT 11 08/01/2015   AST 11 08/01/2015   NA 140 08/01/2015   K 3.4* 08/01/2015   CL 104 08/01/2015   CREATININE 0.78 08/01/2015   BUN 6 08/01/2015   CO2 29 08/01/2015   TSH 0.73 08/01/2015   INR 1.03 05/03/2013   HGBA1C 5.5 08/24/2009

## 2015-10-27 ENCOUNTER — Telehealth: Payer: Self-pay | Admitting: Internal Medicine

## 2015-10-27 MED ORDER — FLUCONAZOLE 150 MG PO TABS: ORAL_TABLET | ORAL | Status: DC

## 2015-10-27 MED ORDER — CETIRIZINE HCL 10 MG PO TABS: 10.0000 mg | ORAL_TABLET | Freq: Every day | ORAL | Status: DC

## 2015-10-27 MED ORDER — TRIAMCINOLONE ACETONIDE 55 MCG/ACT NA AERO: 2.0000 | INHALATION_SPRAY | Freq: Every day | NASAL | Status: DC

## 2015-10-27 MED ORDER — PREDNISONE 10 MG PO TABS: ORAL_TABLET | ORAL | Status: DC

## 2015-10-27 NOTE — Telephone Encounter (Signed)
Pt called stated Dr. Jenny Reichmann sent in 5 rx for yesterday. Pt stated Zyrtec, Diflucan, Prednisone and triamcinolone need to be send to [facility]. Please check and make sure.

## 2015-10-27 NOTE — Telephone Encounter (Signed)
Medications sent to correct pharmacy

## 2015-10-31 ENCOUNTER — Other Ambulatory Visit: Payer: Self-pay

## 2015-10-31 MED ORDER — FAMOTIDINE 20 MG PO TABS: 20.0000 mg | ORAL_TABLET | Freq: Every day | ORAL | Status: DC

## 2015-11-02 ENCOUNTER — Telehealth: Payer: Self-pay

## 2015-11-02 DIAGNOSIS — Z79891 Long term (current) use of opiate analgesic: Secondary | ICD-10-CM | POA: Diagnosis not present

## 2015-11-02 DIAGNOSIS — M961 Postlaminectomy syndrome, not elsewhere classified: Secondary | ICD-10-CM | POA: Diagnosis not present

## 2015-11-02 DIAGNOSIS — M4726 Other spondylosis with radiculopathy, lumbar region: Secondary | ICD-10-CM | POA: Diagnosis not present

## 2015-11-02 DIAGNOSIS — M542 Cervicalgia: Secondary | ICD-10-CM | POA: Diagnosis not present

## 2015-11-02 DIAGNOSIS — M5136 Other intervertebral disc degeneration, lumbar region: Secondary | ICD-10-CM | POA: Diagnosis not present

## 2015-11-02 MED ORDER — SULFAMETHOXAZOLE-TRIMETHOPRIM 800-160 MG PO TABS: 1.0000 | ORAL_TABLET | Freq: Two times a day (BID) | ORAL | Status: DC

## 2015-11-02 NOTE — Telephone Encounter (Signed)
Patient aware.

## 2015-11-02 NOTE — Telephone Encounter (Signed)
Patient says she is not feeling any better after taken the abx she got last Thursday for her cellulitis. She is wanting to know what she needs to do about it. PLease advise or follow up.

## 2015-11-02 NOTE — Telephone Encounter (Signed)
Ok for 7 days septra ds  - done erx

## 2015-11-02 NOTE — Telephone Encounter (Signed)
Please advise 

## 2015-11-08 NOTE — Telephone Encounter (Signed)
Please advise 

## 2015-11-08 NOTE — Telephone Encounter (Signed)
Patient called to advise that she has finished the septra, and has seen no improvement on the current DX. Symptoms have not worsened but have not improved

## 2015-11-08 NOTE — Telephone Encounter (Signed)
Got patient scheduled for Friday.  Patient denies fever.

## 2015-11-08 NOTE — Telephone Encounter (Signed)
OK for ROV at her convenience, today if fever, but next available if no fever, worsening pain, red/swelling

## 2015-11-09 ENCOUNTER — Other Ambulatory Visit: Payer: Self-pay | Admitting: Internal Medicine

## 2015-11-10 ENCOUNTER — Ambulatory Visit: Payer: Medicare Other | Admitting: Internal Medicine

## 2015-11-14 ENCOUNTER — Other Ambulatory Visit: Payer: Self-pay | Admitting: Internal Medicine

## 2015-11-15 ENCOUNTER — Encounter: Payer: Self-pay | Admitting: Internal Medicine

## 2015-11-15 NOTE — Telephone Encounter (Signed)
I would make an appt with Dr Smith/sports medicine for further eval  I will ask the office to call to help you make appt  Corrine to help pt get appt with Dr Tamala Julian please

## 2015-11-21 ENCOUNTER — Ambulatory Visit: Payer: Medicare Other | Admitting: Internal Medicine

## 2015-11-21 DIAGNOSIS — Z0289 Encounter for other administrative examinations: Secondary | ICD-10-CM

## 2015-11-24 DIAGNOSIS — Z1231 Encounter for screening mammogram for malignant neoplasm of breast: Secondary | ICD-10-CM | POA: Diagnosis not present

## 2015-11-30 ENCOUNTER — Encounter: Payer: Self-pay | Admitting: Internal Medicine

## 2015-11-30 ENCOUNTER — Ambulatory Visit: Payer: Medicare Other | Admitting: Internal Medicine

## 2015-11-30 ENCOUNTER — Ambulatory Visit (INDEPENDENT_AMBULATORY_CARE_PROVIDER_SITE_OTHER): Payer: Medicare Other | Admitting: Internal Medicine

## 2015-11-30 VITALS — BP 128/78 | HR 96 | Temp 98.2°F | Resp 20 | Wt 151.0 lb

## 2015-11-30 DIAGNOSIS — F329 Major depressive disorder, single episode, unspecified: Secondary | ICD-10-CM | POA: Diagnosis not present

## 2015-11-30 DIAGNOSIS — J438 Other emphysema: Secondary | ICD-10-CM | POA: Diagnosis not present

## 2015-11-30 DIAGNOSIS — F32A Depression, unspecified: Secondary | ICD-10-CM

## 2015-11-30 DIAGNOSIS — M25522 Pain in left elbow: Secondary | ICD-10-CM | POA: Diagnosis not present

## 2015-11-30 MED ORDER — PREDNISONE 10 MG PO TABS: ORAL_TABLET | ORAL | Status: DC

## 2015-11-30 MED ORDER — GABAPENTIN 300 MG PO CAPS: ORAL_CAPSULE | ORAL | Status: DC

## 2015-11-30 NOTE — Patient Instructions (Addendum)
Please take all new medication as prescribed - the prednisone  Please continue all other medications as before, and refills have been done if requested.  Please have the pharmacy call with any other refills you may need.  Please keep your appointments with your specialists as you may have planned  You will be contacted regarding the referral for: Dr Smith/sports medicine

## 2015-11-30 NOTE — Progress Notes (Signed)
Pre visit review using our clinic review tool, if applicable. No additional management support is needed unless otherwise documented below in the visit note. 

## 2015-12-03 NOTE — Assessment & Plan Note (Signed)
stable overall by history and exam, recent data reviewed with pt, and pt to continue medical treatment as before,  to f/u any worsening symptoms or concerns SpO2 Readings from Last 3 Encounters:  11/30/15 97%  10/26/15 96%  08/01/15 95%

## 2015-12-03 NOTE — Assessment & Plan Note (Signed)
C/w olecranon bursitis likely, ok for predpac asd, but also refer to sports med if not improved,  to f/u any worsening symptoms or concerns

## 2015-12-03 NOTE — Assessment & Plan Note (Signed)
stable overall by history and exam, recent data reviewed with pt, and pt to continue medical treatment as before,  to f/u any worsening symptoms or concerns Lab Results  Component Value Date   WBC 9.7 08/01/2015   HGB 16.3* 08/01/2015   HCT 48.4* 08/01/2015   PLT 324.0 08/01/2015   GLUCOSE 101* 08/01/2015   CHOL 156 08/01/2015   TRIG 135.0 08/01/2015   HDL 36.60* 08/01/2015   LDLDIRECT 102.7 08/21/2011   LDLCALC 93 08/01/2015   ALT 11 08/01/2015   AST 11 08/01/2015   NA 140 08/01/2015   K 3.4* 08/01/2015   CL 104 08/01/2015   CREATININE 0.78 08/01/2015   BUN 6 08/01/2015   CO2 29 08/01/2015   TSH 0.73 08/01/2015   INR 1.03 05/03/2013   HGBA1C 5.5 08/24/2009

## 2015-12-03 NOTE — Progress Notes (Signed)
Subjective:    Patient ID: [name], female    DOB: 03/03/1956, 60 y.o.   MRN: [id number]  HPI  Here with 2 wks worsening left elbow pain/swelling and erythema, mod, constant, sharp, with some radiation to the mid upper arm posteriorly, without fever, trauma or hx of gout. Pt denies chest pain, increased sob or doe, wheezing, orthopnea, PND, increased LE swelling, palpitations, dizziness or syncope. Pt denies new neurological symptoms such as new headache, or facial or extremity weakness or numbness   Pt denies polydipsia, polyuria, or low sugar symptoms such as weakness or confusion improved with po intake.  Pt states overall good compliance with meds.   Past Medical History  Diagnosis Date  . Depression     bipolar  . OCD (obsessive compulsive disorder)     anxiety  . Migraines   . Brain aneurysm   . Chronic abdominal pain   . History of colonic polyps   . Peripheral neuropathy ([place])   . Fibromyalgia   . HLD (hyperlipidemia)   . GERD (gastroesophageal reflux disease)   . IBS (irritable bowel syndrome)     chronic constipation  . Allergic rhinitis   . Gastroparesis   . COPD (chronic obstructive pulmonary disease) ([place])     denies  . H/O hiatal hernia   . Arthritis    Past Surgical History  Procedure Laterality Date  . Appendectomy    . Cholecystectomy    . Vesicovaginal fistula closure w/ tah    . Oophorectomy    . Coil to brain aneurysm    . Shoulder surgery Left   . Elbow surgery Bilateral     tendonitis  . Abdominal hysterectomy    . Cervical disc surgery    . Colonoscopy    . Tubal ligation    . Radiology with anesthesia N/A 05/10/2013    Procedure: RADIOLOGY WITH ANESTHESIA;  Surgeon: Rob Hickman, MD;  Location: [facility];  Service: Radiology;  Laterality: N/A;    reports that she has been smoking Cigarettes.  She has a 39 pack-year smoking history. She has never used smokeless tobacco. She reports that she does not drink alcohol or use illicit  drugs. family history includes Cancer in her father, mother, and sister; Leukemia in her sister. There is no history of Colon cancer. Allergies  Allergen Reactions  . Ambien [Zolpidem Tartrate] Other (See Comments)    Memory issues  . Hydrocodone Nausea Only    Can take it if its in a cough syrup   . Nsaids     REACTION: GI irritation  . Propoxyphene N-Acetaminophen Itching  . Tramadol     REACTION: nausea  . Varenicline Tartrate     REACTION: agitation, depression, sick to stomach  . Chantix [Varenicline] Other (See Comments)    "Makes bipolar worse"  . Other Other (See Comments)    Lactose intolerant   Current Outpatient Prescriptions on File Prior to Visit  Medication Sig Dispense Refill  . albuterol (PROVENTIL HFA;VENTOLIN HFA) 108 (90 BASE) MCG/ACT inhaler Inhale 2 puffs into the lungs every 6 (six) hours as needed for wheezing or shortness of breath. 1 Inhaler 11  . aspirin 325 MG tablet Take 325 mg by mouth daily.    Marland Kitchen atorvastatin (LIPITOR) 40 MG tablet Take 1 tablet by mouth  daily 90 tablet 0  . benztropine (COGENTIN) 1 MG tablet Take 1 tablet (1 mg total) by mouth 2 (two) times daily. 90 tablet 3  . cetirizine (ZYRTEC)  10 MG tablet Take 1 tablet (10 mg total) by mouth daily. 30 tablet 11  . Cholecalciferol (VITAMIN D3) 1000 UNITS CAPS Take 1,000 Units by mouth daily.     . cyanocobalamin 1000 MCG tablet Take 1,000 mcg by mouth daily.     Marland Kitchen doxycycline (VIBRA-TABS) 100 MG tablet Take 1 tablet (100 mg total) by mouth 2 (two) times daily. 20 tablet 0  . doxycycline (VIBRA-TABS) 100 MG tablet Take 1 tablet by mouth two  times daily 20 tablet 0  . DULoxetine (CYMBALTA) 60 MG capsule TAKE ONE CAPSULE EVERY DAY 30 capsule 0  . famotidine (PEPCID) 20 MG tablet Take 1 tablet by mouth  daily 90 tablet 3  . fluconazole (DIFLUCAN) 150 MG tablet 1 tab by mouth every 3 days as needed 2 tablet 1  . lamoTRIgine (LAMICTAL) 200 MG tablet Take 1 tablet (200 mg total) by mouth daily. 90  tablet 1  . morphine (MS CONTIN) 30 MG 12 hr tablet Take 30 mg by mouth.    . OLANZapine (ZYPREXA) 15 MG tablet Take 1 tablet by mouth at  bedtime 30 tablet 0  . omeprazole (PRILOSEC) 40 MG capsule Take 40 mg by mouth.    . oxyCODONE (ROXICODONE) 15 MG immediate release tablet Take 15 mg by mouth 4 (four) times daily as needed for pain.    . potassium chloride (K-DUR) 10 MEQ tablet TAKE ONE TABLET BY MOUTH EVERY DAY 30 tablet 11  . potassium chloride (K-DUR) 10 MEQ tablet TAKE ONE TABLET BY MOUTH EVERY DAY 30 tablet 0  . promethazine (PHENERGAN) 12.5 MG tablet Take 1 tablet by mouth  every 6 hours as needed for nausea 90 tablet 0  . triamcinolone (NASACORT AQ) 55 MCG/ACT AERO nasal inhaler Place 2 sprays into the nose daily. 1 Inhaler 12   No current facility-administered medications on file prior to visit.   Review of Systems  Constitutional: Negative for unusual diaphoresis or night sweats HENT: Negative for ear swelling or discharge Eyes: Negative for worsening visual haziness  Respiratory: Negative for choking and stridor.   Gastrointestinal: Negative for distension or worsening eructation Genitourinary: Negative for retention or change in urine volume.  Musculoskeletal: Negative for other MSK pain or swelling Skin: Negative for color change and worsening wound Neurological: Negative for tremors and numbness other than noted  Psychiatric/Behavioral: Negative for decreased concentration or agitation other than above       Objective:   Physical Exam BP 128/78 mmHg  Pulse 96  Temp(Src) 98.2 F (36.8 C) (Oral)  Resp 20  Wt 151 lb (68.493 kg)  SpO2 97% VS noted,  Constitutional: Pt appears in no apparent distress HENT: Head: NCAT.  Right Ear: External ear normal.  Left Ear: External ear normal.  Eyes: . Pupils are equal, round, and reactive to light. Conjunctivae and EOM are normal Neck: Normal range of motion. Neck supple.  Cardiovascular: Normal rate and regular rhythm.    Pulmonary/Chest: Effort normal and breath sounds without rales or wheezing.  Left elbow with 2+ tender/swelling to the olecranon, but FROM Neurological: Pt is alert. Not confused , motor grossly intact Skin: Skin is warm. No rash, no LE edema Psychiatric: Pt behavior is normal. No agitation.     Assessment & Plan:

## 2015-12-06 ENCOUNTER — Ambulatory Visit: Payer: Medicare Other | Admitting: Internal Medicine

## 2015-12-09 ENCOUNTER — Other Ambulatory Visit: Payer: Self-pay | Admitting: Internal Medicine

## 2015-12-22 ENCOUNTER — Ambulatory Visit: Payer: Medicare Other | Admitting: Family Medicine

## 2015-12-26 ENCOUNTER — Telehealth: Payer: Self-pay

## 2015-12-26 NOTE — Telephone Encounter (Signed)
Pt called and stated that she called on a Wednesday to cancel her appt with Dr. Tamala Julian. The appt is showing as a no show.

## 2015-12-27 DIAGNOSIS — Z79899 Other long term (current) drug therapy: Secondary | ICD-10-CM | POA: Diagnosis not present

## 2015-12-27 DIAGNOSIS — M4726 Other spondylosis with radiculopathy, lumbar region: Secondary | ICD-10-CM | POA: Diagnosis not present

## 2015-12-27 DIAGNOSIS — M542 Cervicalgia: Secondary | ICD-10-CM | POA: Diagnosis not present

## 2015-12-27 DIAGNOSIS — M5136 Other intervertebral disc degeneration, lumbar region: Secondary | ICD-10-CM | POA: Diagnosis not present

## 2015-12-27 DIAGNOSIS — M961 Postlaminectomy syndrome, not elsewhere classified: Secondary | ICD-10-CM | POA: Diagnosis not present

## 2016-01-02 ENCOUNTER — Other Ambulatory Visit (HOSPITAL_COMMUNITY): Payer: Self-pay | Admitting: Interventional Radiology

## 2016-01-02 ENCOUNTER — Other Ambulatory Visit: Payer: Self-pay | Admitting: Internal Medicine

## 2016-01-02 DIAGNOSIS — I729 Aneurysm of unspecified site: Secondary | ICD-10-CM

## 2016-01-02 NOTE — Telephone Encounter (Signed)
Patient has one refill left on medication. Sent in 3 month supply with no refills. Needs ROV for further refills after that.

## 2016-01-02 NOTE — Telephone Encounter (Signed)
Called patient left vm to advise to call once she receives a bill, then i will contact billing to remove.

## 2016-01-09 DIAGNOSIS — H53453 Other localized visual field defect, bilateral: Secondary | ICD-10-CM | POA: Diagnosis not present

## 2016-01-09 DIAGNOSIS — H52223 Regular astigmatism, bilateral: Secondary | ICD-10-CM | POA: Diagnosis not present

## 2016-01-09 DIAGNOSIS — H524 Presbyopia: Secondary | ICD-10-CM | POA: Diagnosis not present

## 2016-01-09 DIAGNOSIS — H2513 Age-related nuclear cataract, bilateral: Secondary | ICD-10-CM | POA: Diagnosis not present

## 2016-01-09 DIAGNOSIS — H5212 Myopia, left eye: Secondary | ICD-10-CM | POA: Diagnosis not present

## 2016-01-10 ENCOUNTER — Ambulatory Visit (HOSPITAL_COMMUNITY)
Admission: RE | Admit: 2016-01-10 | Discharge: 2016-01-10 | Disposition: A | Discharge status: Home / Self Care | Payer: Medicare Other | Source: Ambulatory Visit | Attending: Interventional Radiology | Admitting: Interventional Radiology

## 2016-01-10 ENCOUNTER — Ambulatory Visit (HOSPITAL_COMMUNITY): Payer: Medicare Other

## 2016-01-10 DIAGNOSIS — I671 Cerebral aneurysm, nonruptured: Secondary | ICD-10-CM | POA: Diagnosis not present

## 2016-01-10 DIAGNOSIS — I729 Aneurysm of unspecified site: Secondary | ICD-10-CM | POA: Diagnosis present

## 2016-01-10 DIAGNOSIS — R51 Headache: Secondary | ICD-10-CM | POA: Diagnosis not present

## 2016-01-10 MED ORDER — IOPAMIDOL (ISOVUE-370) INJECTION 76%
INTRAVENOUS | Status: AC
  Administered 2016-01-10: 50 mL via INTRAVENOUS
  Filled 2016-01-10: qty 50

## 2016-01-11 LAB — POCT I-STAT CREATININE: Creatinine, Ser: 0.8 mg/dL (ref 0.44–1.00)

## 2016-01-23 ENCOUNTER — Telehealth (HOSPITAL_COMMUNITY): Payer: Self-pay

## 2016-01-23 NOTE — Telephone Encounter (Signed)
Pt agreed to f/u in 1 yr with a cta head/neck. AW

## 2016-02-01 ENCOUNTER — Ambulatory Visit (INDEPENDENT_AMBULATORY_CARE_PROVIDER_SITE_OTHER): Payer: Medicare Other | Admitting: Internal Medicine

## 2016-02-01 ENCOUNTER — Encounter: Payer: Self-pay | Admitting: Internal Medicine

## 2016-02-01 ENCOUNTER — Telehealth: Payer: Self-pay

## 2016-02-01 VITALS — BP 126/78 | HR 96 | Temp 98.1°F | Resp 20 | Wt 158.0 lb

## 2016-02-01 DIAGNOSIS — F32A Depression, unspecified: Secondary | ICD-10-CM

## 2016-02-01 DIAGNOSIS — F329 Major depressive disorder, single episode, unspecified: Secondary | ICD-10-CM

## 2016-02-01 DIAGNOSIS — J438 Other emphysema: Secondary | ICD-10-CM | POA: Diagnosis not present

## 2016-02-01 DIAGNOSIS — J019 Acute sinusitis, unspecified: Secondary | ICD-10-CM

## 2016-02-01 MED ORDER — LEVOFLOXACIN 500 MG PO TABS: 500.0000 mg | ORAL_TABLET | Freq: Every day | ORAL | 0 refills | Status: DC

## 2016-02-01 MED ORDER — HYDROCODONE-HOMATROPINE 5-1.5 MG/5ML PO SYRP: 5.0000 mL | ORAL_SOLUTION | Freq: Four times a day (QID) | ORAL | 0 refills | Status: AC | PRN

## 2016-02-01 MED ORDER — LEVOFLOXACIN 500 MG PO TABS: 500.0000 mg | ORAL_TABLET | Freq: Every day | ORAL | 0 refills | Status: AC

## 2016-02-01 NOTE — Progress Notes (Signed)
Pre visit review using our clinic review tool, if applicable. No additional management support is needed unless otherwise documented below in the visit note. 

## 2016-02-01 NOTE — Patient Instructions (Addendum)
Please take all new medication as prescribed  - the antibiotic, and cough medicine  Please continue all other medications as before, and refills have been done if requested.  Please have the pharmacy call with any other refills you may need.  Please keep your appointments with your specialists as you may have planned  Please stop smoking

## 2016-02-01 NOTE — Progress Notes (Signed)
Subjective:    Patient ID: [name], female    DOB: 05-30-1956, 60 y.o.   MRN: [id number]  HPI   Here with 2-3 days acute onset fever, facial pain, pressure, headache, general weakness and malaise, and greenish d/c, with mild ST and mild green prod cough, but pt denies chest pain, wheezing, increased sob or doe, orthopnea, PND, increased LE swelling, palpitations, dizziness or syncope.  Still smoking, hard to quit.  Denies worsening depressive symptoms, suicidal ideation, or panic; has ongoing anxiety, not worse recently.   Past Medical History:  Diagnosis Date  . Allergic rhinitis   . Arthritis   . Brain aneurysm   . Chronic abdominal pain   . COPD (chronic obstructive pulmonary disease) ([place])    denies  . Depression    bipolar  . Fibromyalgia   . Gastroparesis   . GERD (gastroesophageal reflux disease)   . H/O hiatal hernia   . History of colonic polyps   . HLD (hyperlipidemia)   . IBS (irritable bowel syndrome)    chronic constipation  . Migraines   . OCD (obsessive compulsive disorder)    anxiety  . Peripheral neuropathy [facility])    Past Surgical History:  Procedure Laterality Date  . ABDOMINAL HYSTERECTOMY    . APPENDECTOMY    . CERVICAL DISC SURGERY    . cholecystectomy    . coil to brain aneurysm    . COLONOSCOPY    . ELBOW SURGERY Bilateral    tendonitis  . OOPHORECTOMY    . RADIOLOGY WITH ANESTHESIA N/A 05/10/2013   Procedure: RADIOLOGY WITH ANESTHESIA;  Surgeon: Rob Hickman, MD;  Location: [facility];  Service: Radiology;  Laterality: N/A;  . SHOULDER SURGERY Left   . TUBAL LIGATION    . VESICOVAGINAL FISTULA CLOSURE W/ TAH      reports that she has been smoking Cigarettes.  She has a 39.00 pack-year smoking history. She has never used smokeless tobacco. She reports that she does not drink alcohol or use drugs. family history includes Cancer in her father, mother, and sister; Leukemia in her sister. Allergies  Allergen Reactions  . Ambien [Zolpidem  Tartrate] Other (See Comments)    Memory issues  . Hydrocodone Nausea Only    Can take it if its in a cough syrup   . Nsaids     REACTION: GI irritation  . Propoxyphene N-Acetaminophen Itching  . Tramadol     REACTION: nausea  . Varenicline Tartrate     REACTION: agitation, depression, sick to stomach  . Chantix [Varenicline] Other (See Comments)    "Makes bipolar worse"  . Other Other (See Comments)    Lactose intolerant   Current Outpatient Prescriptions on File Prior to Visit  Medication Sig Dispense Refill  . albuterol (PROVENTIL HFA;VENTOLIN HFA) 108 (90 BASE) MCG/ACT inhaler Inhale 2 puffs into the lungs every 6 (six) hours as needed for wheezing or shortness of breath. 1 Inhaler 11  . aspirin 325 MG tablet Take 325 mg by mouth daily.    Marland Kitchen atorvastatin (LIPITOR) 40 MG tablet Take 1 tablet by mouth  daily 90 tablet 0  . benztropine (COGENTIN) 1 MG tablet Take 1 tablet (1 mg total) by mouth 2 (two) times daily. 90 tablet 3  . cetirizine (ZYRTEC) 10 MG tablet Take 1 tablet (10 mg total) by mouth daily. 30 tablet 11  . Cholecalciferol (VITAMIN D3) 1000 UNITS CAPS Take 1,000 Units by mouth daily.     . cyanocobalamin 1000 MCG  tablet Take 1,000 mcg by mouth daily.     Marland Kitchen doxycycline (VIBRA-TABS) 100 MG tablet Take 1 tablet by mouth two  times daily 20 tablet 0  . DULoxetine (CYMBALTA) 60 MG capsule TAKE ONE CAPSULE EVERY DAY 30 capsule 0  . famotidine (PEPCID) 20 MG tablet Take 1 tablet by mouth  daily 90 tablet 3  . fluconazole (DIFLUCAN) 150 MG tablet 1 tab by mouth every 3 days as needed 2 tablet 1  . gabapentin (NEURONTIN) 300 MG capsule 2 tabs by mouth three time daily 180 capsule 5  . lamoTRIgine (LAMICTAL) 200 MG tablet Take 1 tablet (200 mg total) by mouth daily. 90 tablet 1  . morphine (MS CONTIN) 30 MG 12 hr tablet Take 30 mg by mouth.    . OLANZapine (ZYPREXA) 15 MG tablet Take 1 tablet by mouth at  bedtime 30 tablet 0  . omeprazole (PRILOSEC) 40 MG capsule Take 40 mg by  mouth.    . oxyCODONE (ROXICODONE) 15 MG immediate release tablet Take 15 mg by mouth 4 (four) times daily as needed for pain.    . potassium chloride (K-DUR) 10 MEQ tablet TAKE ONE TABLET BY MOUTH EVERY DAY 30 tablet 11  . potassium chloride (K-DUR) 10 MEQ tablet TAKE ONE TABLET BY MOUTH EVERY DAY 30 tablet 0  . potassium chloride (K-DUR) 10 MEQ tablet TAKE ONE TABLET BY MOUTH EVERY DAY 30 tablet 0  . predniSONE (DELTASONE) 10 MG tablet 3 tabs by mouth per day for 3 days,2tabs per day for 3 days,1tab per day for 3 days 18 tablet 0  . promethazine (PHENERGAN) 12.5 MG tablet Take 1 tablet by mouth  every 6 hours as needed for nausea 90 tablet 0  . triamcinolone (NASACORT AQ) 55 MCG/ACT AERO nasal inhaler Place 2 sprays into the nose daily. 1 Inhaler 12   No current facility-administered medications on file prior to visit.    Review of Systems  Constitutional: Negative for unusual diaphoresis or night sweats HENT: Negative for ear swelling or discharge Eyes: Negative for worsening visual haziness  Respiratory: Negative for choking and stridor.   Gastrointestinal: Negative for distension or worsening eructation Genitourinary: Negative for retention or change in urine volume.  Musculoskeletal: Negative for other MSK pain or swelling Skin: Negative for color change and worsening wound Neurological: Negative for tremors and numbness other than noted  Psychiatric/Behavioral: Negative for decreased concentration or agitation other than above       Objective:   Physical Exam BP 126/78   Pulse 96   Temp 98.1 F (36.7 C) (Oral)   Resp 20   Wt 158 lb (71.7 kg)   SpO2 94%   BMI 26.29 kg/m  VS noted, mild ill Constitutional: Pt appears in no apparent distress HENT: Head: NCAT.  Right Ear: External ear normal.  Left Ear: External ear normal.  Bilat tm's with mild erythema.  Max sinus areas mild tender.  Pharynx with mild erythema, no exudate Eyes: . Pupils are equal, round, and reactive to  light. Conjunctivae and EOM are normal Neck: Normal range of motion. Neck supple.  Cardiovascular: Normal rate and regular rhythm.   Pulmonary/Chest: Effort normal and breath sounds decreased without rales or wheezing.  Neurological: Pt is alert. Not confused , motor grossly intact Skin: Skin is warm. No rash, no LE edema Psychiatric: Pt behavior is normal. No agitation. not depressed affect, mild nervous    Assessment & Plan:

## 2016-02-01 NOTE — Assessment & Plan Note (Signed)
stable overall by history and exam, recent data reviewed with pt, and pt to continue medical treatment as before,  to f/u any worsening symptoms or concerns BP Readings from Last 3 Encounters:  02/01/16 126/78  11/30/15 128/78  10/26/15 124/78

## 2016-02-01 NOTE — Assessment & Plan Note (Signed)
Mild to mod, for antibx course,  to f/u any worsening symptoms or concerns 

## 2016-02-01 NOTE — Telephone Encounter (Signed)
Medication sent to correct pharmacy  

## 2016-02-01 NOTE — Assessment & Plan Note (Signed)
stable overall by history and exam, recent data reviewed with pt, and pt to continue medical treatment as before,  to f/u any worsening symptoms or concerns @LASTSAO2(3)@  

## 2016-02-08 ENCOUNTER — Other Ambulatory Visit: Payer: Self-pay | Admitting: Internal Medicine

## 2016-02-08 ENCOUNTER — Encounter: Payer: Self-pay | Admitting: Internal Medicine

## 2016-02-09 ENCOUNTER — Other Ambulatory Visit: Payer: Self-pay | Admitting: Internal Medicine

## 2016-02-20 ENCOUNTER — Encounter: Payer: Self-pay | Admitting: Internal Medicine

## 2016-02-20 MED ORDER — POTASSIUM CHLORIDE ER 10 MEQ PO TBCR: 10.0000 meq | EXTENDED_RELEASE_TABLET | Freq: Every day | ORAL | 11 refills | Status: DC

## 2016-02-20 NOTE — Telephone Encounter (Signed)
Potassium done erx 

## 2016-02-22 ENCOUNTER — Encounter: Payer: Self-pay | Admitting: Internal Medicine

## 2016-02-22 DIAGNOSIS — E876 Hypokalemia: Secondary | ICD-10-CM

## 2016-02-26 DIAGNOSIS — M5136 Other intervertebral disc degeneration, lumbar region: Secondary | ICD-10-CM | POA: Diagnosis not present

## 2016-02-26 DIAGNOSIS — M4726 Other spondylosis with radiculopathy, lumbar region: Secondary | ICD-10-CM | POA: Diagnosis not present

## 2016-02-26 DIAGNOSIS — M961 Postlaminectomy syndrome, not elsewhere classified: Secondary | ICD-10-CM | POA: Diagnosis not present

## 2016-02-26 DIAGNOSIS — Z79891 Long term (current) use of opiate analgesic: Secondary | ICD-10-CM | POA: Diagnosis not present

## 2016-02-26 DIAGNOSIS — M542 Cervicalgia: Secondary | ICD-10-CM | POA: Diagnosis not present

## 2016-03-01 ENCOUNTER — Ambulatory Visit (INDEPENDENT_AMBULATORY_CARE_PROVIDER_SITE_OTHER): Payer: Medicare Other

## 2016-03-01 ENCOUNTER — Telehealth: Payer: Self-pay

## 2016-03-01 ENCOUNTER — Other Ambulatory Visit (INDEPENDENT_AMBULATORY_CARE_PROVIDER_SITE_OTHER): Payer: Medicare Other

## 2016-03-01 DIAGNOSIS — E876 Hypokalemia: Secondary | ICD-10-CM

## 2016-03-01 DIAGNOSIS — R7989 Other specified abnormal findings of blood chemistry: Secondary | ICD-10-CM

## 2016-03-01 DIAGNOSIS — Z23 Encounter for immunization: Secondary | ICD-10-CM | POA: Diagnosis not present

## 2016-03-01 LAB — BASIC METABOLIC PANEL
BUN: 6 mg/dL (ref 6–23)
CO2: 28 mEq/L (ref 19–32)
Calcium: 9.2 mg/dL (ref 8.4–10.5)
Chloride: 103 mEq/L (ref 96–112)
Creatinine, Ser: 0.94 mg/dL (ref 0.40–1.20)
GFR: 65.02 mL/min (ref >60.00)
Glucose, Bld: 103 mg/dL — ABNORMAL HIGH (ref 70–99)
Potassium: 4.3 mEq/L (ref 3.5–5.1)
SODIUM: 138 meq/L (ref 135–145)

## 2016-03-01 LAB — T4, FREE: FREE T4: 0.84 ng/dL (ref 0.60–1.60)

## 2016-03-01 LAB — TSH: TSH: 1.28 u[IU]/mL (ref 0.35–4.50)

## 2016-03-01 NOTE — Telephone Encounter (Signed)
Patient is asking if she needs to take another pneumonia vaccine sometime this year----was hospitalized last year in September for pneumonia and she has lung issues----please advise, I will call patient back, thanks

## 2016-03-05 NOTE — Telephone Encounter (Signed)
[place] for nurse visit for Prevnar 13 -

## 2016-03-05 NOTE — Telephone Encounter (Signed)
Patient has scheduled nurse visit for prevnar 13

## 2016-03-19 ENCOUNTER — Ambulatory Visit (INDEPENDENT_AMBULATORY_CARE_PROVIDER_SITE_OTHER): Payer: Medicare Other | Admitting: General Practice

## 2016-03-19 DIAGNOSIS — Z23 Encounter for immunization: Secondary | ICD-10-CM

## 2016-03-20 ENCOUNTER — Encounter: Payer: Self-pay | Admitting: Internal Medicine

## 2016-03-25 ENCOUNTER — Encounter: Payer: Self-pay | Admitting: Internal Medicine

## 2016-04-16 ENCOUNTER — Other Ambulatory Visit: Payer: Self-pay | Admitting: Internal Medicine

## 2016-04-17 ENCOUNTER — Other Ambulatory Visit: Payer: Self-pay | Admitting: *Deleted

## 2016-04-17 MED ORDER — POTASSIUM CHLORIDE ER 10 MEQ PO TBCR: 10.0000 meq | EXTENDED_RELEASE_TABLET | Freq: Every day | ORAL | 1 refills | Status: DC

## 2016-04-17 NOTE — Telephone Encounter (Signed)
Rec'd call pt is requesting refill to be sent to [facility] for her potassium. Sent rx electronically...[name]

## 2016-04-25 ENCOUNTER — Encounter: Payer: Self-pay | Admitting: Internal Medicine

## 2016-04-25 DIAGNOSIS — Z79891 Long term (current) use of opiate analgesic: Secondary | ICD-10-CM | POA: Diagnosis not present

## 2016-04-25 DIAGNOSIS — M542 Cervicalgia: Secondary | ICD-10-CM | POA: Diagnosis not present

## 2016-04-25 DIAGNOSIS — M5136 Other intervertebral disc degeneration, lumbar region: Secondary | ICD-10-CM | POA: Diagnosis not present

## 2016-04-25 DIAGNOSIS — M961 Postlaminectomy syndrome, not elsewhere classified: Secondary | ICD-10-CM | POA: Diagnosis not present

## 2016-04-25 DIAGNOSIS — M4726 Other spondylosis with radiculopathy, lumbar region: Secondary | ICD-10-CM | POA: Diagnosis not present

## 2016-05-23 ENCOUNTER — Ambulatory Visit (INDEPENDENT_AMBULATORY_CARE_PROVIDER_SITE_OTHER): Payer: Medicare Other | Admitting: Internal Medicine

## 2016-05-23 ENCOUNTER — Encounter: Payer: Self-pay | Admitting: Internal Medicine

## 2016-05-23 VITALS — BP 140/80 | HR 90 | Temp 98.0°F | Resp 20 | Wt 155.0 lb

## 2016-05-23 DIAGNOSIS — J309 Allergic rhinitis, unspecified: Secondary | ICD-10-CM

## 2016-05-23 DIAGNOSIS — J438 Other emphysema: Secondary | ICD-10-CM | POA: Diagnosis not present

## 2016-05-23 DIAGNOSIS — J019 Acute sinusitis, unspecified: Secondary | ICD-10-CM

## 2016-05-23 MED ORDER — PREDNISONE 10 MG PO TABS: ORAL_TABLET | ORAL | 0 refills | Status: DC

## 2016-05-23 NOTE — Progress Notes (Signed)
Pre visit review using our clinic review tool, if applicable. No additional management support is needed unless otherwise documented below in the visit note. 

## 2016-05-23 NOTE — Patient Instructions (Signed)
Please take all new medication as prescribed - the antibiotic, and prednisone  Please continue all other medications as before, except you can stop the zyrtec if you dont think is helping  Please have the pharmacy call with any other refills you may need.  Please keep your appointments with your specialists as you may have planned

## 2016-05-23 NOTE — Progress Notes (Signed)
Subjective:    Patient ID: [name], female    DOB: July 09, 1955, 60 y.o.   MRN: [id number]  HPI   Here with 2-3 days acute onset fever, facial pain, pressure, headache, general weakness and malaise, and greenish d/c, with mild ST and cough, but pt denies chest pain, wheezing, increased sob or doe, orthopnea, PND, increased LE swelling, palpitations, dizziness or syncope.  All this despite taking nasacort and zyrtec. Pt c/o burning sweats day and night times, and sometime scold sweats for several months , sometimes both at one time, seems to change minute to minute. s/p Menopause about 2004 with significant postmenop symptoms but none for several years, did take HRT etstrogen  Denies hyper or hypo thyroid symptoms such as voice, skin or hair change. Lab Results  Component Value Date   TSH 1.28 03/01/2016  On increased lamictal per psychiatry, and less narcotic, only taking oxycodone 20 qid (no morphine)  Denies urinary symptoms such as dysuria, frequency, urgency, flank pain, hematuria or n/v, fever, chills. No other history changes Past Medical History:  Diagnosis Date  . Allergic rhinitis   . Arthritis   . Brain aneurysm   . Chronic abdominal pain   . COPD (chronic obstructive pulmonary disease) ([place])    denies  . Depression    bipolar  . Fibromyalgia   . Gastroparesis   . GERD (gastroesophageal reflux disease)   . H/O hiatal hernia   . History of colonic polyps   . HLD (hyperlipidemia)   . IBS (irritable bowel syndrome)    chronic constipation  . Migraines   . OCD (obsessive compulsive disorder)    anxiety  . Peripheral neuropathy [facility])    Past Surgical History:  Procedure Laterality Date  . ABDOMINAL HYSTERECTOMY    . APPENDECTOMY    . CERVICAL DISC SURGERY    . cholecystectomy    . coil to brain aneurysm    . COLONOSCOPY    . ELBOW SURGERY Bilateral    tendonitis  . OOPHORECTOMY    . RADIOLOGY WITH ANESTHESIA N/A 05/10/2013   Procedure: RADIOLOGY WITH  ANESTHESIA;  Surgeon: Rob Hickman, MD;  Location: [facility];  Service: Radiology;  Laterality: N/A;  . SHOULDER SURGERY Left   . TUBAL LIGATION    . VESICOVAGINAL FISTULA CLOSURE W/ TAH      reports that she has been smoking Cigarettes.  She has a 39.00 pack-year smoking history. She has never used smokeless tobacco. She reports that she does not drink alcohol or use drugs. family history includes Cancer in her father, mother, and sister; Leukemia in her sister. Allergies  Allergen Reactions  . Ambien [Zolpidem Tartrate] Other (See Comments)    Memory issues  . Hydrocodone Nausea Only    Can take it if its in a cough syrup   . Nsaids     REACTION: GI irritation  . Propoxyphene N-Acetaminophen Itching  . Tramadol     REACTION: nausea  . Varenicline Tartrate     REACTION: agitation, depression, sick to stomach  . Chantix [Varenicline] Other (See Comments)    "Makes bipolar worse"  . Other Other (See Comments)    Lactose intolerant   Current Outpatient Prescriptions on File Prior to Visit  Medication Sig Dispense Refill  . aspirin 325 MG tablet Take 325 mg by mouth daily.    Marland Kitchen atorvastatin (LIPITOR) 40 MG tablet TAKE 1 TABLET BY MOUTH  DAILY 90 tablet 0  . Cholecalciferol (VITAMIN D3) 1000 UNITS CAPS  Take 1,000 Units by mouth daily.     . cyanocobalamin 1000 MCG tablet Take 1,000 mcg by mouth daily.     . DULoxetine (CYMBALTA) 60 MG capsule TAKE ONE CAPSULE EVERY DAY 30 capsule 0  . famotidine (PEPCID) 20 MG tablet Take 1 tablet by mouth  daily 90 tablet 3  . fluconazole (DIFLUCAN) 150 MG tablet 1 tab by mouth every 3 days as needed 2 tablet 1  . gabapentin (NEURONTIN) 300 MG capsule 2 tabs by mouth three time daily 180 capsule 5  . lamoTRIgine (LAMICTAL) 200 MG tablet Take 1 tablet (200 mg total) by mouth daily. 90 tablet 1  . OLANZapine (ZYPREXA) 15 MG tablet Take 1 tablet by mouth at  bedtime 30 tablet 0  . omeprazole (PRILOSEC) 40 MG capsule Take 40 mg by mouth.    .  potassium chloride (K-DUR) 10 MEQ tablet Take 1 tablet (10 mEq total) by mouth daily. 90 tablet 1  . PROAIR HFA 108 (90 Base) MCG/ACT inhaler INHALE 2 PUFFS INTO THE LUNGS EVERY 6 HOURS AS NEEDED FOR WHEEZING ORSHORTNESS OF BREATH. 8.5 g 0  . promethazine (PHENERGAN) 12.5 MG tablet Take 1 tablet by mouth  every 6 hours as needed for nausea 90 tablet 0  . triamcinolone (NASACORT AQ) 55 MCG/ACT AERO nasal inhaler Place 2 sprays into the nose daily. 1 Inhaler 12   No current facility-administered medications on file prior to visit.    Review of Systems  Constitutional: Negative for unusual diaphoresis or night sweats HENT: Negative for ear swelling or discharge Eyes: Negative for worsening visual haziness  Respiratory: Negative for choking and stridor.   Gastrointestinal: Negative for distension or worsening eructation Genitourinary: Negative for retention or change in urine volume.  Musculoskeletal: Negative for other MSK pain or swelling Skin: Negative for color change and worsening wound Neurological: Negative for tremors and numbness other than noted  Psychiatric/Behavioral: Negative for decreased concentration or agitation other than above   All other system neg per pt    Objective:   Physical Exam BP 140/80   Pulse 90   Temp 98 F (36.7 C) (Oral)   Resp 20   Wt 155 lb (70.3 kg)   SpO2 97%   BMI 25.79 kg/m  VS noted, mild ill appaering Constitutional: Pt appears in no apparent distress HENT: Head: NCAT.  Right Ear: External ear normal.  Left Ear: External ear normal.  Eyes: . Pupils are equal, round, and reactive to light. Conjunctivae and EOM are normal Bilat tm's with mild erythema.  Max sinus areas mild tender.  Pharynx with mild erythema, no exudate Neck: Normal range of motion. Neck supple.  Cardiovascular: Normal rate and regular rhythm.   Pulmonary/Chest: Effort normal and breath sounds without rales or wheezing.  Neurological: Pt is alert. Not confused , motor  grossly intact Skin: Skin is warm. No rash, no LE edema Psychiatric: Pt behavior is normal. No agitation.  No other new exam findings    Assessment & Plan:

## 2016-05-24 ENCOUNTER — Telehealth: Payer: Self-pay | Admitting: *Deleted

## 2016-05-24 MED ORDER — AZITHROMYCIN 250 MG PO TABS: ORAL_TABLET | ORAL | 1 refills | Status: DC

## 2016-05-24 NOTE — Telephone Encounter (Signed)
Pharmacy left msg on triage stating pt states MD was suppose to have sent an antibiotic along w/prednisone. We did not received rx for antibiotic. Pls advise...Johny Chess

## 2016-05-24 NOTE — Telephone Encounter (Signed)
Notified pt rx has been sent.../lmb 

## 2016-05-25 NOTE — Assessment & Plan Note (Signed)
stable overall by history and exam, recent data reviewed with pt, and pt to continue medical treatment as before,  to f/u any worsening symptoms or concerns @LASTSAO2(3)@  

## 2016-05-25 NOTE — Assessment & Plan Note (Signed)
Mild to mod, for antibx course,  to f/u any worsening symptoms or concerns 

## 2016-05-25 NOTE — Assessment & Plan Note (Signed)
stable overall by history and exam, cont zyrtec and nasacort asd, and pt to continue medical treatment as before,  to f/u any worsening symptoms or concerns

## 2016-06-06 ENCOUNTER — Other Ambulatory Visit: Payer: Self-pay | Admitting: *Deleted

## 2016-06-06 MED ORDER — POTASSIUM CHLORIDE ER 10 MEQ PO TBCR: 10.0000 meq | EXTENDED_RELEASE_TABLET | Freq: Every day | ORAL | 1 refills | Status: DC

## 2016-06-06 NOTE — Telephone Encounter (Signed)
Rec'd call pt states she can get her potassium at no cost if she get through her mail service. Requesting rx for potassium to be sent to [facility]...Johny Chess

## 2016-06-20 DIAGNOSIS — M4726 Other spondylosis with radiculopathy, lumbar region: Secondary | ICD-10-CM | POA: Diagnosis not present

## 2016-06-20 DIAGNOSIS — M961 Postlaminectomy syndrome, not elsewhere classified: Secondary | ICD-10-CM | POA: Diagnosis not present

## 2016-06-20 DIAGNOSIS — M5136 Other intervertebral disc degeneration, lumbar region: Secondary | ICD-10-CM | POA: Diagnosis not present

## 2016-06-20 DIAGNOSIS — M542 Cervicalgia: Secondary | ICD-10-CM | POA: Diagnosis not present

## 2016-06-20 DIAGNOSIS — Z79891 Long term (current) use of opiate analgesic: Secondary | ICD-10-CM | POA: Diagnosis not present

## 2016-07-02 ENCOUNTER — Other Ambulatory Visit: Payer: Self-pay | Admitting: Internal Medicine

## 2016-07-02 ENCOUNTER — Encounter: Payer: Self-pay | Admitting: Internal Medicine

## 2016-07-02 NOTE — Telephone Encounter (Signed)
Ok for corinne to renew the lipitor as per routine med refill policy  The prednisone is not normally a refillable medication  Please consider ROV if felt to be possibly needed

## 2016-07-04 ENCOUNTER — Encounter: Payer: Self-pay | Admitting: Internal Medicine

## 2016-07-05 NOTE — Telephone Encounter (Signed)
Same disposition on meds - see below

## 2016-07-05 NOTE — Telephone Encounter (Signed)
Office to call pt , to offer ROV next available, including sat clinic please

## 2016-07-09 ENCOUNTER — Encounter: Payer: Self-pay | Admitting: Internal Medicine

## 2016-07-09 ENCOUNTER — Ambulatory Visit (INDEPENDENT_AMBULATORY_CARE_PROVIDER_SITE_OTHER): Payer: Medicare Other | Admitting: Internal Medicine

## 2016-07-09 VITALS — BP 140/80 | HR 93 | Temp 98.8°F | Resp 20 | Wt 157.0 lb

## 2016-07-09 DIAGNOSIS — J438 Other emphysema: Secondary | ICD-10-CM | POA: Diagnosis not present

## 2016-07-09 DIAGNOSIS — J019 Acute sinusitis, unspecified: Secondary | ICD-10-CM

## 2016-07-09 MED ORDER — LEVOFLOXACIN 250 MG PO TABS: 250.0000 mg | ORAL_TABLET | Freq: Every day | ORAL | 0 refills | Status: AC

## 2016-07-09 MED ORDER — HYDROCODONE-HOMATROPINE 5-1.5 MG/5ML PO SYRP: 5.0000 mL | ORAL_SOLUTION | Freq: Four times a day (QID) | ORAL | 0 refills | Status: AC | PRN

## 2016-07-09 MED ORDER — LEVOFLOXACIN 250 MG PO TABS: 250.0000 mg | ORAL_TABLET | Freq: Every day | ORAL | 0 refills | Status: DC

## 2016-07-09 NOTE — Progress Notes (Signed)
Subjective:    Patient ID: [name], female    DOB: June 05, 1956, 61 y.o.   MRN: [id number]  HPI   Here with 2-3 days acute onset fever, facial pain, pressure, headache, general weakness and malaise, and greenish d/c, with mild ST and cough, but pt denies chest pain, wheezing, increased sob or doe, orthopnea, PND, increased LE swelling, palpitations, dizziness or syncope.   Past Medical History:  Diagnosis Date  . Allergic rhinitis   . Arthritis   . Brain aneurysm   . Chronic abdominal pain   . COPD (chronic obstructive pulmonary disease) ([place])    denies  . Depression    bipolar  . Fibromyalgia   . Gastroparesis   . GERD (gastroesophageal reflux disease)   . H/O hiatal hernia   . History of colonic polyps   . HLD (hyperlipidemia)   . IBS (irritable bowel syndrome)    chronic constipation  . Migraines   . OCD (obsessive compulsive disorder)    anxiety  . Peripheral neuropathy [facility])    Past Surgical History:  Procedure Laterality Date  . ABDOMINAL HYSTERECTOMY    . APPENDECTOMY    . CERVICAL DISC SURGERY    . cholecystectomy    . coil to brain aneurysm    . COLONOSCOPY    . ELBOW SURGERY Bilateral    tendonitis  . OOPHORECTOMY    . RADIOLOGY WITH ANESTHESIA N/A 05/10/2013   Procedure: RADIOLOGY WITH ANESTHESIA;  Surgeon: Rob Hickman, MD;  Location: [place];  Service: Radiology;  Laterality: N/A;  . SHOULDER SURGERY Left   . TUBAL LIGATION    . VESICOVAGINAL FISTULA CLOSURE W/ TAH      reports that she has been smoking Cigarettes.  She has a 39.00 pack-year smoking history. She has never used smokeless tobacco. She reports that she does not drink alcohol or use drugs. family history includes Cancer in her father, mother, and sister; Leukemia in her sister. Allergies  Allergen Reactions  . Ambien [Zolpidem Tartrate] Other (See Comments)    Memory issues  . Hydrocodone Nausea Only    Can take it if its in a cough syrup   . Nsaids     REACTION: GI  irritation  . Propoxyphene N-Acetaminophen Itching  . Tramadol     REACTION: nausea  . Varenicline Tartrate     REACTION: agitation, depression, sick to stomach  . Chantix [Varenicline] Other (See Comments)    "Makes bipolar worse"  . Other Other (See Comments)    Lactose intolerant   Current Outpatient Prescriptions on File Prior to Visit  Medication Sig Dispense Refill  . aspirin 325 MG tablet Take 325 mg by mouth daily.    . Cholecalciferol (VITAMIN D3) 1000 UNITS CAPS Take 1,000 Units by mouth daily.     . cyanocobalamin 1000 MCG tablet Take 1,000 mcg by mouth daily.     . DULoxetine (CYMBALTA) 60 MG capsule TAKE ONE CAPSULE EVERY DAY 30 capsule 0  . gabapentin (NEURONTIN) 300 MG capsule 2 tabs by mouth three time daily 180 capsule 5  . OLANZapine (ZYPREXA) 15 MG tablet Take 1 tablet by mouth at  bedtime 30 tablet 0  . omeprazole (PRILOSEC) 40 MG capsule Take 40 mg by mouth.    . potassium chloride (K-DUR) 10 MEQ tablet Take 1 tablet (10 mEq total) by mouth daily. 90 tablet 1  . triamcinolone (NASACORT AQ) 55 MCG/ACT AERO nasal inhaler Place 2 sprays into the nose daily. 1 Inhaler 12  .  famotidine (PEPCID) 20 MG tablet Take 1 tablet by mouth  daily 90 tablet 3  . fluconazole (DIFLUCAN) 150 MG tablet 1 tab by mouth every 3 days as needed (Patient not taking: Reported on 07/09/2016) 2 tablet 1  . lamoTRIgine (LAMICTAL) 200 MG tablet Take 1 tablet (200 mg total) by mouth daily. 90 tablet 1  . predniSONE (DELTASONE) 10 MG tablet 3 tabs by mouth per day for 3 days,2tabs per day for 3 days,1tab per day for 3 days (Patient not taking: Reported on 07/09/2016) 18 tablet 0  . PROAIR HFA 108 (90 Base) MCG/ACT inhaler INHALE 2 PUFFS INTO THE LUNGS EVERY 6 HOURS AS NEEDED FOR WHEEZING ORSHORTNESS OF BREATH. 8.5 g 0  . promethazine (PHENERGAN) 12.5 MG tablet Take 1 tablet by mouth  every 6 hours as needed for nausea (Patient not taking: Reported on 07/09/2016) 90 tablet 0   No current  facility-administered medications on file prior to visit.    Review of Systems All otherwise neg per pt    Objective:   Physical Exam BP 140/80   Pulse 93   Temp 98.8 F (37.1 C) (Oral)   Resp 20   Wt 157 lb (71.2 kg)   SpO2 97%   BMI 26.13 kg/m  VS noted,  Constitutional: Pt appears in no apparent distress HENT: Head: NCAT.  Right Ear: External ear normal.  Left Ear: External ear normal.  Eyes: . Pupils are equal, round, and reactive to light. Conjunctivae and EOM are normal Bilat tm's with mild erythema.  Max sinus areas mild tender.  Pharynx with mild erythema, no exudate Neck: Normal range of motion. Neck supple.  Cardiovascular: Normal rate and regular rhythm.   Pulmonary/Chest: Effort normal and breath sounds without rales or wheezing.  Neurological: Pt is alert. Not confused , motor grossly intact Skin: Skin is warm. No rash, no LE edema Psychiatric: Pt behavior is normal. No agitation.  No other new exam findings    Assessment & Plan:

## 2016-07-09 NOTE — Patient Instructions (Signed)
Please take all new medication as prescribed  - the antibiotic, and the cough medicine If needed  Please continue all other medications as before, and refills have been done if requested.  Please have the pharmacy call with any other refills you may need.  Please keep your appointments with your specialists as you may have planned

## 2016-07-09 NOTE — Progress Notes (Signed)
Pre visit review using our clinic review tool, if applicable. No additional management support is needed unless otherwise documented below in the visit note. 

## 2016-07-10 ENCOUNTER — Encounter: Payer: Self-pay | Admitting: Internal Medicine

## 2016-07-10 DIAGNOSIS — L989 Disorder of the skin and subcutaneous tissue, unspecified: Secondary | ICD-10-CM

## 2016-07-11 ENCOUNTER — Encounter: Payer: Self-pay | Admitting: Internal Medicine

## 2016-07-11 DIAGNOSIS — Z0001 Encounter for general adult medical examination with abnormal findings: Secondary | ICD-10-CM

## 2016-07-12 DIAGNOSIS — H53413 Scotoma involving central area, bilateral: Secondary | ICD-10-CM | POA: Diagnosis not present

## 2016-07-14 ENCOUNTER — Other Ambulatory Visit: Payer: Self-pay | Admitting: Internal Medicine

## 2016-07-15 NOTE — Assessment & Plan Note (Signed)
stable overall by history and exam, recent data reviewed with pt, and pt to continue medical treatment as before,  to f/u any worsening symptoms or concerns @LASTSAO2(3)@  

## 2016-08-01 ENCOUNTER — Other Ambulatory Visit (INDEPENDENT_AMBULATORY_CARE_PROVIDER_SITE_OTHER): Payer: Medicare Other

## 2016-08-01 DIAGNOSIS — Z0001 Encounter for general adult medical examination with abnormal findings: Secondary | ICD-10-CM

## 2016-08-01 LAB — URINALYSIS, ROUTINE W REFLEX MICROSCOPIC
Bilirubin Urine: NEGATIVE
Ketones, ur: NEGATIVE
Leukocytes, UA: NEGATIVE
Nitrite: NEGATIVE
Specific Gravity, Urine: 1.02 (ref 1.000–1.030)
TOTAL PROTEIN, URINE-UPE24: NEGATIVE
UROBILINOGEN UA: 0.2 (ref 0.0–1.0)
Urine Glucose: NEGATIVE
pH: 5.5 (ref 5.0–8.0)

## 2016-08-01 LAB — CBC WITH DIFFERENTIAL/PLATELET
BASOS PCT: 1.3 % (ref 0.0–3.0)
Basophils Absolute: 0.1 10*3/uL (ref 0.0–0.1)
EOS PCT: 1.9 % (ref 0.0–5.0)
Eosinophils Absolute: 0.2 10*3/uL (ref 0.0–0.7)
HCT: 42.9 % (ref 36.0–46.0)
Hemoglobin: 14.6 g/dL (ref 12.0–15.0)
LYMPHS ABS: 1.8 10*3/uL (ref 0.7–4.0)
Lymphocytes Relative: 21.7 % (ref 12.0–46.0)
MCHC: 34 g/dL (ref 30.0–36.0)
MCV: 95 fl (ref 78.0–100.0)
MONOS PCT: 7.9 % (ref 3.0–12.0)
Monocytes Absolute: 0.7 10*3/uL (ref 0.1–1.0)
NEUTROS PCT: 67.2 % (ref 43.0–77.0)
Neutro Abs: 5.7 10*3/uL (ref 1.4–7.7)
Platelets: 269 10*3/uL (ref 150.0–400.0)
RBC: 4.51 Mil/uL (ref 3.87–5.11)
RDW: 14.6 % (ref 11.5–15.5)
WBC: 8.5 10*3/uL (ref 4.0–10.5)

## 2016-08-01 LAB — BASIC METABOLIC PANEL
BUN: 7 mg/dL (ref 6–23)
CALCIUM: 9.6 mg/dL (ref 8.4–10.5)
CO2: 27 mEq/L (ref 19–32)
Chloride: 107 mEq/L (ref 96–112)
Creatinine, Ser: 0.9 mg/dL (ref 0.40–1.20)
GFR: 68.26 mL/min (ref >60.00)
Glucose, Bld: 115 mg/dL — ABNORMAL HIGH (ref 70–99)
POTASSIUM: 4.1 meq/L (ref 3.5–5.1)
SODIUM: 137 meq/L (ref 135–145)

## 2016-08-01 LAB — LIPID PANEL
CHOL/HDL RATIO: 2
CHOLESTEROL: 119 mg/dL (ref 0–200)
HDL: 55.4 mg/dL (ref >39.00)
LDL CALC: 51 mg/dL (ref 0–99)
NONHDL: 63.81
Triglycerides: 62 mg/dL (ref 0.0–149.0)
VLDL: 12.4 mg/dL (ref 0.0–40.0)

## 2016-08-01 LAB — HEPATIC FUNCTION PANEL
ALK PHOS: 69 U/L (ref 39–117)
ALT: 15 U/L (ref 0–35)
AST: 12 U/L (ref 0–37)
Albumin: 4.1 g/dL (ref 3.5–5.2)
BILIRUBIN DIRECT: 0.1 mg/dL (ref 0.0–0.3)
BILIRUBIN TOTAL: 0.4 mg/dL (ref 0.2–1.2)
Total Protein: 6.5 g/dL (ref 6.0–8.3)

## 2016-08-01 LAB — TSH: TSH: 0.74 u[IU]/mL (ref 0.35–4.50)

## 2016-08-02 ENCOUNTER — Encounter: Payer: Self-pay | Admitting: Internal Medicine

## 2016-08-09 ENCOUNTER — Encounter: Payer: Self-pay | Admitting: Internal Medicine

## 2016-08-09 ENCOUNTER — Ambulatory Visit (INDEPENDENT_AMBULATORY_CARE_PROVIDER_SITE_OTHER): Payer: Medicare Other | Admitting: Internal Medicine

## 2016-08-09 ENCOUNTER — Other Ambulatory Visit (INDEPENDENT_AMBULATORY_CARE_PROVIDER_SITE_OTHER): Payer: Medicare Other

## 2016-08-09 VITALS — BP 106/68 | HR 98 | Temp 98.0°F | Ht 65.0 in | Wt 153.0 lb

## 2016-08-09 DIAGNOSIS — R32 Unspecified urinary incontinence: Secondary | ICD-10-CM | POA: Insufficient documentation

## 2016-08-09 DIAGNOSIS — M545 Low back pain: Secondary | ICD-10-CM

## 2016-08-09 DIAGNOSIS — R739 Hyperglycemia, unspecified: Secondary | ICD-10-CM | POA: Diagnosis not present

## 2016-08-09 DIAGNOSIS — Z0000 Encounter for general adult medical examination without abnormal findings: Secondary | ICD-10-CM | POA: Diagnosis not present

## 2016-08-09 DIAGNOSIS — G8929 Other chronic pain: Secondary | ICD-10-CM | POA: Diagnosis not present

## 2016-08-09 DIAGNOSIS — R490 Dysphonia: Secondary | ICD-10-CM

## 2016-08-09 DIAGNOSIS — Z0001 Encounter for general adult medical examination with abnormal findings: Secondary | ICD-10-CM | POA: Insufficient documentation

## 2016-08-09 LAB — HEMOGLOBIN A1C: Hgb A1c MFr Bld: 5.7 % (ref 4.6–6.5)

## 2016-08-09 MED ORDER — ESTROGENS, CONJUGATED 0.625 MG/GM VA CREA: 1.0000 | TOPICAL_CREAM | Freq: Every day | VAGINAL | 12 refills | Status: DC

## 2016-08-09 MED ORDER — ALBUTEROL SULFATE HFA 108 (90 BASE) MCG/ACT IN AERS: INHALATION_SPRAY | RESPIRATORY_TRACT | 11 refills | Status: DC

## 2016-08-09 NOTE — Progress Notes (Signed)
Subjective:    Patient ID: [name], female    DOB: [birth year], 61 y.o.   MRN: [id number]  HPI  Here for wellness and f/u;  Overall doing ok;  Pt denies Chest pain, worsening SOB, DOE, wheezing, orthopnea, PND, worsening LE edema, palpitations, dizziness or syncope.  Pt denies neurological change such as new headache, facial or extremity weakness.  Pt denies polydipsia, polyuria, or low sugar symptoms. Pt states overall good compliance with treatment and medications, good tolerability, and has been trying to follow appropriate diet.  Pt denies worsening depressive symptoms, suicidal ideation or panic. No fever, night sweats, wt loss, loss of appetite, or other constitutional symptoms.  Pt states good ability with ADL's, has low fall risk, home safety reviewed and adequate, no other significant changes in hearing or vision, and only occasionally active with exercise.  Has lost several lbs with new working out at the [employer] and [employer] on [address].   Wt Readings from Last 3 Encounters:  08/09/16 153 lb (69.4 kg)  07/09/16 157 lb (71.2 kg)  05/23/16 155 lb (70.3 kg)  Now has dental insurance, for teeth eval soon.  Due for f/u colonoscopy in July 2018 per Dr Erlene Quan.   Has recent new Dx gluacoma , now sees optho every 6 mo, to see new specialist mar 13.  Has some recurrent hoarseness in the past wk, plans to f/u with ENT as she has hx of vocal cord polyp.  Pt continues to have recurring LBP without change in severity, bowel or bladder change, fever, wt loss,  worsening LE pain/numbness/weakness, gait change or falls. Also has mild recurring urinary incontinence, would like to avoid urology for now, has been post menopausal for several years Past Medical History:  Diagnosis Date  . Allergic rhinitis   . Arthritis   . Brain aneurysm   . Chronic abdominal pain   . COPD (chronic obstructive pulmonary disease) ([place])    denies  . Depression    bipolar  . Fibromyalgia   . Gastroparesis   . GERD  (gastroesophageal reflux disease)   . H/O hiatal hernia   . History of colonic polyps   . HLD (hyperlipidemia)   . IBS (irritable bowel syndrome)    chronic constipation  . Migraines   . OCD (obsessive compulsive disorder)    anxiety  . Peripheral neuropathy [facility])    Past Surgical History:  Procedure Laterality Date  . ABDOMINAL HYSTERECTOMY    . APPENDECTOMY    . CERVICAL DISC SURGERY    . cholecystectomy    . coil to brain aneurysm    . COLONOSCOPY    . ELBOW SURGERY Bilateral    tendonitis  . NECK SURGERY    . OOPHORECTOMY    . RADIOLOGY WITH ANESTHESIA N/A 05/10/2013   Procedure: RADIOLOGY WITH ANESTHESIA;  Surgeon: Rob Hickman, MD;  Location: [place];  Service: Radiology;  Laterality: N/A;  . SHOULDER SURGERY Left   . TUBAL LIGATION    . VESICOVAGINAL FISTULA CLOSURE W/ TAH      reports that she has been smoking Cigarettes.  She has a 39.00 pack-year smoking history. She has never used smokeless tobacco. She reports that she does not drink alcohol or use drugs. family history includes Cancer in her father, mother, and sister; Leukemia in her sister. Allergies  Allergen Reactions  . Ambien [Zolpidem Tartrate] Other (See Comments)    Memory issues  . Hydrocodone Nausea Only    Can take it if  its in a cough syrup   . Nsaids     REACTION: GI irritation  . Propoxyphene N-Acetaminophen Itching  . Tramadol     REACTION: nausea  . Varenicline Tartrate     REACTION: agitation, depression, sick to stomach  . Chantix [Varenicline] Other (See Comments)    "Makes bipolar worse"  . Other Other (See Comments)    Lactose intolerant   Current Outpatient Prescriptions on File Prior to Visit  Medication Sig Dispense Refill  . aspirin 325 MG tablet Take 325 mg by mouth daily.    Marland Kitchen atorvastatin (LIPITOR) 40 MG tablet TAKE 1 TABLET BY MOUTH  DAILY 90 tablet 1  . Cholecalciferol (VITAMIN D3) 1000 UNITS CAPS Take 1,000 Units by mouth daily.     . cyanocobalamin 1000 MCG  tablet Take 1,000 mcg by mouth daily.     . DULoxetine (CYMBALTA) 60 MG capsule TAKE ONE CAPSULE EVERY DAY 30 capsule 0  . famotidine (PEPCID) 20 MG tablet Take 1 tablet by mouth  daily 90 tablet 3  . fluconazole (DIFLUCAN) 150 MG tablet 1 tab by mouth every 3 days as needed 2 tablet 1  . gabapentin (NEURONTIN) 300 MG capsule 2 tabs by mouth three time daily 180 capsule 5  . lamoTRIgine (LAMICTAL) 200 MG tablet Take 1 tablet (200 mg total) by mouth daily. 90 tablet 1  . OLANZapine (ZYPREXA) 15 MG tablet Take 1 tablet by mouth at  bedtime 30 tablet 0  . omeprazole (PRILOSEC) 40 MG capsule Take 40 mg by mouth.    . potassium chloride (K-DUR) 10 MEQ tablet Take 1 tablet (10 mEq total) by mouth daily. 90 tablet 1  . promethazine (PHENERGAN) 12.5 MG tablet Take 1 tablet by mouth  every 6 hours as needed for nausea 90 tablet 0  . triamcinolone (NASACORT AQ) 55 MCG/ACT AERO nasal inhaler Place 2 sprays into the nose daily. 1 Inhaler 12   No current facility-administered medications on file prior to visit.    Review of Systems  Constitutional: Negative for increased diaphoresis, or other activity, appetite or siginficant weight change other than noted HENT: Negative for worsening hearing loss, ear pain, facial swelling, mouth sores and neck stiffness.   Eyes: Negative for other worsening pain, redness or visual disturbance.  Respiratory: Negative for choking or stridor Cardiovascular: Negative for other chest pain and palpitations.  Gastrointestinal: Negative for worsening diarrhea, blood in stool, or abdominal distention Genitourinary: Negative for hematuria, flank pain or change in urine volume.  Musculoskeletal: Negative for myalgias or other joint complaints.  Skin: Negative for other color change and wound or drainage.  Neurological: Negative for syncope and numbness. other than noted Hematological: Negative for adenopathy. or other swelling Psychiatric/Behavioral: Negative for hallucinations,  SI, self-injury, decreased concentration or other worsening agitation.      Objective:   Physical Exam BP 106/68   Pulse 98   Temp 98 F (36.7 C)   Ht 5\' 5"  (1.651 m)   Wt 153 lb (69.4 kg)   SpO2 96%   BMI 25.46 kg/m  VS noted,  Constitutional: Pt is oriented to person, place, and time. Appears well-developed and well-nourished, in no significant distress Head: Normocephalic and atraumatic  Eyes: Conjunctivae and EOM are normal. Pupils are equal, round, and reactive to light Right Ear: External ear normal.  Left Ear: External ear normal Nose: Nose normal.  Mouth/Throat: Oropharynx is clear and moist , + mild hoarseness Neck: Normal range of motion. Neck supple. No JVD present. No  tracheal deviation present or significant neck LA or mass Cardiovascular: Normal rate, regular rhythm, normal heart sounds and intact distal pulses.   Pulmonary/Chest: Effort normal and breath sounds without rales or wheezing  Abdominal: Soft. Bowel sounds are normal. NT. No HSM  Musculoskeletal: Normal range of motion. Exhibits no edema  Has chronic midline lower lumbar tender without swelling Lymphadenopathy: Has no cervical adenopathy.  Neurological: Pt is alert and oriented to person, place, and time. Pt has normal reflexes. No cranial nerve deficit. Motor grossly intact Skin: Skin is warm and dry. No rash noted or new ulcers Psychiatric:  Has normal mood and affect. Behavior is normal.  No other new exam findings  ECG I have personally interpreted today Sinus  Rhythm  -  Nonspecific T-abnormality.  Lab Results  Component Value Date   WBC 8.5 08/01/2016   HGB 14.6 08/01/2016   HCT 42.9 08/01/2016   PLT 269.0 08/01/2016   GLUCOSE 115 (H) 08/01/2016   CHOL 119 08/01/2016   TRIG 62.0 08/01/2016   HDL 55.40 08/01/2016   LDLDIRECT 102.7 08/21/2011   LDLCALC 51 08/01/2016   ALT 15 08/01/2016   AST 12 08/01/2016   NA 137 08/01/2016   K 4.1 08/01/2016   CL 107 08/01/2016   CREATININE 0.90  08/01/2016   BUN 7 08/01/2016   CO2 27 08/01/2016   TSH 0.74 08/01/2016   INR 1.03 05/03/2013   HGBA1C 5.7 08/09/2016      Assessment & Plan:

## 2016-08-09 NOTE — Patient Instructions (Addendum)
Please take all new medication as prescribed - the cream  Please continue all other medications as before, and refills have been done if requested - the Proair  Please have the pharmacy call with any other refills you may need.  Please continue your efforts at being more active, low cholesterol diet, and weight control.  You are otherwise up to date with prevention measures today.  Please keep your appointments with your specialists as you may have planned - ENT for the hoarseness, and Gastroenterology for the July 2018 colonoscopy, as well as pain management  Please go to the LAB in the Basement (turn left off the elevator) for the tests to be done today  You will be contacted by phone if any changes need to be made immediately.  Otherwise, you will receive a letter about your results with an explanation, but please check with MyChart first.  Please remember to sign up for MyChart if you have not done so, as this will be important to you in the future with finding out test results, communicating by private email, and scheduling acute appointments online when needed.  Please return in 6 months, or sooner if needed

## 2016-08-09 NOTE — Assessment & Plan Note (Signed)
No significant evidence for other cause such as URI; agree with pt f/u ENT, pt states will make appt

## 2016-08-09 NOTE — Assessment & Plan Note (Signed)
Lab Results  Component Value Date   HGBA1C 5.7 08/09/2016   stable overall by history and exam, recent data reviewed with pt, and pt to continue medical treatment as before,  to f/u any worsening symptoms or concerns

## 2016-08-09 NOTE — Assessment & Plan Note (Addendum)
Mild for several months, for premarin cream daily asd, consider urology if not improved  In addition to the time spent performing CPE, I spent an additional 15 minutes face to face,in which greater than 50% of this time was spent in counseling and coordination of care for patient's illness as documented.

## 2016-08-09 NOTE — Assessment & Plan Note (Signed)
Has f/u with pain management, o/w stable overall by history and exam, recent data reviewed with pt, and pt to continue medical treatment as before,  to f/u any worsening symptoms or concerns

## 2016-08-10 LAB — HIV ANTIBODY (ROUTINE TESTING W REFLEX): HIV: NONREACTIVE

## 2016-08-15 DIAGNOSIS — M5136 Other intervertebral disc degeneration, lumbar region: Secondary | ICD-10-CM | POA: Diagnosis not present

## 2016-08-15 DIAGNOSIS — M542 Cervicalgia: Secondary | ICD-10-CM | POA: Diagnosis not present

## 2016-08-15 DIAGNOSIS — M961 Postlaminectomy syndrome, not elsewhere classified: Secondary | ICD-10-CM | POA: Diagnosis not present

## 2016-08-15 DIAGNOSIS — M4726 Other spondylosis with radiculopathy, lumbar region: Secondary | ICD-10-CM | POA: Diagnosis not present

## 2016-08-15 DIAGNOSIS — Z79891 Long term (current) use of opiate analgesic: Secondary | ICD-10-CM | POA: Diagnosis not present

## 2016-09-13 ENCOUNTER — Encounter: Payer: Self-pay | Admitting: Internal Medicine

## 2016-09-16 ENCOUNTER — Encounter: Payer: Self-pay | Admitting: Internal Medicine

## 2016-09-23 ENCOUNTER — Encounter: Payer: Self-pay | Admitting: Internal Medicine

## 2016-09-30 ENCOUNTER — Other Ambulatory Visit: Payer: Self-pay | Admitting: Internal Medicine

## 2016-10-14 DIAGNOSIS — Z79891 Long term (current) use of opiate analgesic: Secondary | ICD-10-CM | POA: Diagnosis not present

## 2016-11-13 DIAGNOSIS — M961 Postlaminectomy syndrome, not elsewhere classified: Secondary | ICD-10-CM | POA: Diagnosis not present

## 2016-11-13 DIAGNOSIS — M5136 Other intervertebral disc degeneration, lumbar region: Secondary | ICD-10-CM | POA: Diagnosis not present

## 2016-11-13 DIAGNOSIS — Z79891 Long term (current) use of opiate analgesic: Secondary | ICD-10-CM | POA: Diagnosis not present

## 2016-11-13 DIAGNOSIS — M542 Cervicalgia: Secondary | ICD-10-CM | POA: Diagnosis not present

## 2016-11-13 DIAGNOSIS — M4726 Other spondylosis with radiculopathy, lumbar region: Secondary | ICD-10-CM | POA: Diagnosis not present

## 2016-11-30 ENCOUNTER — Other Ambulatory Visit: Payer: Self-pay | Admitting: Internal Medicine

## 2016-12-23 ENCOUNTER — Telehealth (HOSPITAL_COMMUNITY): Payer: Self-pay

## 2016-12-23 NOTE — Telephone Encounter (Signed)
Called to schedule 1 yr f/u cta, left message for pt to return call. AW 

## 2016-12-24 ENCOUNTER — Other Ambulatory Visit (HOSPITAL_COMMUNITY): Payer: Self-pay | Admitting: Interventional Radiology

## 2016-12-24 DIAGNOSIS — I729 Aneurysm of unspecified site: Secondary | ICD-10-CM

## 2016-12-26 ENCOUNTER — Other Ambulatory Visit: Payer: Self-pay | Admitting: Internal Medicine

## 2016-12-26 NOTE — Telephone Encounter (Signed)
Is this refill appropriate?  

## 2017-01-07 ENCOUNTER — Ambulatory Visit (HOSPITAL_COMMUNITY): Payer: Medicare Other

## 2017-01-07 ENCOUNTER — Ambulatory Visit (INDEPENDENT_AMBULATORY_CARE_PROVIDER_SITE_OTHER): Payer: Medicare Other | Admitting: Internal Medicine

## 2017-01-07 ENCOUNTER — Encounter (HOSPITAL_COMMUNITY): Payer: Self-pay

## 2017-01-07 ENCOUNTER — Ambulatory Visit (HOSPITAL_COMMUNITY): Admission: RE | Admit: 2017-01-07 | Payer: Medicare Other | Source: Ambulatory Visit

## 2017-01-07 ENCOUNTER — Encounter: Payer: Self-pay | Admitting: Internal Medicine

## 2017-01-07 VITALS — BP 122/76 | HR 98 | Ht 65.0 in | Wt 151.0 lb

## 2017-01-07 DIAGNOSIS — J441 Chronic obstructive pulmonary disease with (acute) exacerbation: Secondary | ICD-10-CM | POA: Diagnosis not present

## 2017-01-07 DIAGNOSIS — J019 Acute sinusitis, unspecified: Secondary | ICD-10-CM

## 2017-01-07 DIAGNOSIS — R739 Hyperglycemia, unspecified: Secondary | ICD-10-CM | POA: Diagnosis not present

## 2017-01-07 MED ORDER — HYDROCODONE-HOMATROPINE 5-1.5 MG/5ML PO SYRP: 5.0000 mL | ORAL_SOLUTION | Freq: Four times a day (QID) | ORAL | 0 refills | Status: AC | PRN

## 2017-01-07 MED ORDER — LEVOFLOXACIN 500 MG PO TABS: 500.0000 mg | ORAL_TABLET | Freq: Every day | ORAL | 0 refills | Status: AC

## 2017-01-07 MED ORDER — PREDNISONE 10 MG PO TABS
ORAL_TABLET | ORAL | 0 refills | Status: DC
Start: 2017-01-07 — End: 2017-02-06

## 2017-01-07 NOTE — Patient Instructions (Signed)
Please take all new medication as prescribed - the antibiotic, cough medicine, and prednisone ° °Please continue all other medications as before, and refills have been done if requested. ° °Please have the pharmacy call with any other refills you may need. ° °Please keep your appointments with your specialists as you may have planned ° ° ° °

## 2017-01-07 NOTE — Progress Notes (Signed)
Subjective:    Patient ID: [name], female    DOB: 1955-07-10, 61 y.o.   MRN: [id number]  HPI   Here with 2-3 days acute onset fever, facial pain, pressure, headache, general weakness and malaise, and greenish d/c, with mild ST and cough, but pt denies chest pain, wheezing, increased sob or doe, orthopnea, PND, increased LE swelling, palpitations, dizziness or syncope, except for mild onset wheezing/sob since last PM with increased inhaler use.  Pt denies polydipsia, polyuria.  Pt denies new neurological symptoms such as new headache, or facial or extremity weakness or numbness Past Medical History:  Diagnosis Date  . Allergic rhinitis   . Arthritis   . Brain aneurysm   . Chronic abdominal pain   . COPD (chronic obstructive pulmonary disease) ([place])    denies  . Depression    bipolar  . Fibromyalgia   . Gastroparesis   . GERD (gastroesophageal reflux disease)   . H/O hiatal hernia   . History of colonic polyps   . HLD (hyperlipidemia)   . IBS (irritable bowel syndrome)    chronic constipation  . Migraines   . OCD (obsessive compulsive disorder)    anxiety  . Peripheral neuropathy    Past Surgical History:  Procedure Laterality Date  . ABDOMINAL HYSTERECTOMY    . APPENDECTOMY    . CERVICAL DISC SURGERY    . cholecystectomy    . coil to brain aneurysm    . COLONOSCOPY    . ELBOW SURGERY Bilateral    tendonitis  . NECK SURGERY    . OOPHORECTOMY    . RADIOLOGY WITH ANESTHESIA N/A 05/10/2013   Procedure: RADIOLOGY WITH ANESTHESIA;  Surgeon: Rob Hickman, MD;  Location: [place];  Service: Radiology;  Laterality: N/A;  . SHOULDER SURGERY Left   . TUBAL LIGATION    . VESICOVAGINAL FISTULA CLOSURE W/ TAH      reports that she has been smoking Cigarettes.  She has a 39.00 pack-year smoking history. She has never used smokeless tobacco. She reports that she does not drink alcohol or use drugs. family history includes Cancer in her father, mother, sister, and unknown  relative; Coronary artery disease in her unknown relative; Diabetes in her unknown relative; Hypertension in her unknown relative; Leukemia in her sister; Seizures in her unknown relative. Allergies  Allergen Reactions  . Ambien [Zolpidem Tartrate] Other (See Comments)    Memory issues  . Hydrocodone Nausea Only    Can take it if its in a cough syrup   . Nsaids     REACTION: GI irritation  . Propoxyphene N-Acetaminophen Itching  . Tramadol     REACTION: nausea  . Varenicline Tartrate     REACTION: agitation, depression, sick to stomach  . Chantix [Varenicline] Other (See Comments)    "Makes bipolar worse"  . Other Other (See Comments)    Lactose intolerant   Current Outpatient Prescriptions on File Prior to Visit  Medication Sig Dispense Refill  . albuterol (PROAIR HFA) 108 (90 Base) MCG/ACT inhaler INHALE 2 PUFFS INTO THE LUNGS EVERY 6 HOURS AS NEEDED FOR WHEEZING ORSHORTNESS OF BREATH. 8.5 g 11  . aspirin 325 MG tablet Take 325 mg by mouth daily.    Marland Kitchen atorvastatin (LIPITOR) 40 MG tablet TAKE 1 TABLET BY MOUTH  DAILY 90 tablet 0  . Cholecalciferol (VITAMIN D3) 1000 UNITS CAPS Take 1,000 Units by mouth daily.     Marland Kitchen conjugated estrogens (PREMARIN) vaginal cream Place 1 Applicatorful vaginally daily.  42.5 g 12  . cyanocobalamin 1000 MCG tablet Take 1,000 mcg by mouth daily.     . DULoxetine (CYMBALTA) 60 MG capsule TAKE ONE CAPSULE EVERY DAY 30 capsule 0  . famotidine (PEPCID) 20 MG tablet TAKE 1 TABLET BY MOUTH  DAILY 90 tablet 1  . gabapentin (NEURONTIN) 300 MG capsule 2 tabs by mouth three time daily 180 capsule 5  . lamoTRIgine (LAMICTAL) 200 MG tablet Take 1 tablet (200 mg total) by mouth daily. 90 tablet 1  . OLANZapine (ZYPREXA) 15 MG tablet Take 1 tablet by mouth at  bedtime 30 tablet 0  . omeprazole (PRILOSEC) 40 MG capsule Take 40 mg by mouth.    . Oxycodone HCl 20 MG TABS     . potassium chloride (K-DUR,KLOR-CON) 10 MEQ tablet TAKE 1 TABLET BY MOUTH  DAILY 90 tablet 1  .  promethazine (PHENERGAN) 12.5 MG tablet Take 1 tablet by mouth  every 6 hours as needed for nausea 90 tablet 0  . triamcinolone (NASACORT AQ) 55 MCG/ACT AERO nasal inhaler Place 2 sprays into the nose daily. 1 Inhaler 12   No current facility-administered medications on file prior to visit.    Review of Systems  Constitutional: Negative for other unusual diaphoresis or sweats HENT: Negative for ear discharge or swelling Eyes: Negative for other worsening visual disturbances Respiratory: Negative for stridor or other swelling  Gastrointestinal: Negative for worsening distension or other blood Genitourinary: Negative for retention or other urinary change Musculoskeletal: Negative for other MSK pain or swelling Skin: Negative for color change or other new lesions Neurological: Negative for worsening tremors and other numbness  Psychiatric/Behavioral: Negative for worsening agitation or other fatigue All other system neg per pt    Objective:   Physical Exam BP 122/76   Pulse 98   Ht 5\' 5"  (1.651 m)   Wt 151 lb (68.5 kg)   SpO2 98%   BMI 25.13 kg/m   VS noted, mild ill Constitutional: Pt appears in NAD HENT: Head: NCAT.  Right Ear: External ear normal.  Left Ear: External ear normal.  Eyes: . Pupils are equal, round, and reactive to light. Conjunctivae and EOM are normal Bilat tm's with mild erythema.  Max sinus areas mild tender.  Pharynx with mild erythema, no exudate Nose: without d/c or deformity Neck: Neck supple. Gross normal ROM Cardiovascular: Normal rate and regular rhythm.   Pulmonary/Chest: Effort normal and breath sounds decreased without rales but with few bilat scattered wheezing.  Abd:  Soft, NT, ND, + BS, no organomegaly Neurological: Pt is alert. At baseline orientation, motor grossly intact Skin: Skin is warm. No rashes, other new lesions, no LE edema Psychiatric: Pt behavior is normal without agitation  No other exam findings    Assessment & Plan:

## 2017-01-08 DIAGNOSIS — M961 Postlaminectomy syndrome, not elsewhere classified: Secondary | ICD-10-CM | POA: Diagnosis not present

## 2017-01-08 DIAGNOSIS — Z79891 Long term (current) use of opiate analgesic: Secondary | ICD-10-CM | POA: Diagnosis not present

## 2017-01-08 DIAGNOSIS — M5136 Other intervertebral disc degeneration, lumbar region: Secondary | ICD-10-CM | POA: Diagnosis not present

## 2017-01-08 DIAGNOSIS — M542 Cervicalgia: Secondary | ICD-10-CM | POA: Diagnosis not present

## 2017-01-08 DIAGNOSIS — M4726 Other spondylosis with radiculopathy, lumbar region: Secondary | ICD-10-CM | POA: Diagnosis not present

## 2017-01-08 NOTE — Assessment & Plan Note (Signed)
stable overall by history and exam, recent data reviewed with pt, and pt to continue medical treatment as before,  to f/u any worsening symptoms or concerns, pt to call for cbg > 200 with steroid tx Lab Results  Component Value Date   HGBA1C 5.7 08/09/2016

## 2017-01-08 NOTE — Assessment & Plan Note (Signed)
Mild to mod, for predpac asd, cough med prn, cont inhaler prn,  to f/u any worsening symptoms or concerns

## 2017-01-08 NOTE — Assessment & Plan Note (Signed)
Mild to mod, for antibx course,  to f/u any worsening symptoms or concerns 

## 2017-01-17 ENCOUNTER — Ambulatory Visit (HOSPITAL_COMMUNITY)
Admission: RE | Admit: 2017-01-17 | Discharge: 2017-01-17 | Disposition: A | Discharge status: Home / Self Care | Payer: Medicare Other | Source: Ambulatory Visit | Attending: Interventional Radiology | Admitting: Interventional Radiology

## 2017-01-17 ENCOUNTER — Encounter (HOSPITAL_COMMUNITY): Payer: Self-pay

## 2017-01-17 DIAGNOSIS — I671 Cerebral aneurysm, nonruptured: Secondary | ICD-10-CM | POA: Diagnosis not present

## 2017-01-17 DIAGNOSIS — J984 Other disorders of lung: Secondary | ICD-10-CM | POA: Diagnosis not present

## 2017-01-17 DIAGNOSIS — J439 Emphysema, unspecified: Secondary | ICD-10-CM | POA: Diagnosis not present

## 2017-01-17 DIAGNOSIS — I6601 Occlusion and stenosis of right middle cerebral artery: Secondary | ICD-10-CM | POA: Insufficient documentation

## 2017-01-17 DIAGNOSIS — R9439 Abnormal result of other cardiovascular function study: Secondary | ICD-10-CM | POA: Insufficient documentation

## 2017-01-17 DIAGNOSIS — Z95828 Presence of other vascular implants and grafts: Secondary | ICD-10-CM | POA: Insufficient documentation

## 2017-01-17 DIAGNOSIS — I729 Aneurysm of unspecified site: Secondary | ICD-10-CM | POA: Insufficient documentation

## 2017-01-17 MED ORDER — IOPAMIDOL (ISOVUE-370) INJECTION 76%
INTRAVENOUS | Status: AC
  Administered 2017-01-17: 50 mL
  Filled 2017-01-17: qty 50

## 2017-01-21 ENCOUNTER — Telehealth (HOSPITAL_COMMUNITY): Payer: Self-pay

## 2017-01-21 DIAGNOSIS — K219 Gastro-esophageal reflux disease without esophagitis: Secondary | ICD-10-CM | POA: Diagnosis not present

## 2017-01-21 DIAGNOSIS — K581 Irritable bowel syndrome with constipation: Secondary | ICD-10-CM | POA: Diagnosis not present

## 2017-01-21 DIAGNOSIS — Z86010 Personal history of colonic polyps: Secondary | ICD-10-CM | POA: Diagnosis not present

## 2017-01-21 DIAGNOSIS — K224 Dyskinesia of esophagus: Secondary | ICD-10-CM | POA: Diagnosis not present

## 2017-01-21 NOTE — Telephone Encounter (Signed)
Pt agreed to f/u in 6 months with angiogram. AW 

## 2017-01-27 DIAGNOSIS — Z86010 Personal history of colonic polyps: Secondary | ICD-10-CM | POA: Diagnosis not present

## 2017-01-27 DIAGNOSIS — K640 First degree hemorrhoids: Secondary | ICD-10-CM | POA: Diagnosis not present

## 2017-01-27 DIAGNOSIS — K621 Rectal polyp: Secondary | ICD-10-CM | POA: Diagnosis not present

## 2017-01-27 DIAGNOSIS — D125 Benign neoplasm of sigmoid colon: Secondary | ICD-10-CM | POA: Diagnosis not present

## 2017-01-27 DIAGNOSIS — K6389 Other specified diseases of intestine: Secondary | ICD-10-CM | POA: Diagnosis not present

## 2017-01-28 ENCOUNTER — Telehealth: Payer: Self-pay | Admitting: Internal Medicine

## 2017-01-28 NOTE — Telephone Encounter (Signed)
Rec'd from [facility] forwarded 4 pages to Dr. Cathlean Cower

## 2017-02-05 DIAGNOSIS — Z79891 Long term (current) use of opiate analgesic: Secondary | ICD-10-CM | POA: Diagnosis not present

## 2017-02-06 ENCOUNTER — Encounter: Payer: Self-pay | Admitting: Internal Medicine

## 2017-02-06 ENCOUNTER — Ambulatory Visit (INDEPENDENT_AMBULATORY_CARE_PROVIDER_SITE_OTHER): Payer: Medicare Other | Admitting: Internal Medicine

## 2017-02-06 VITALS — BP 102/68 | HR 103 | Temp 98.7°F | Wt 150.1 lb

## 2017-02-06 DIAGNOSIS — H6983 Other specified disorders of Eustachian tube, bilateral: Secondary | ICD-10-CM | POA: Insufficient documentation

## 2017-02-06 DIAGNOSIS — J438 Other emphysema: Secondary | ICD-10-CM

## 2017-02-06 DIAGNOSIS — R739 Hyperglycemia, unspecified: Secondary | ICD-10-CM | POA: Diagnosis not present

## 2017-02-06 DIAGNOSIS — Z0000 Encounter for general adult medical examination without abnormal findings: Secondary | ICD-10-CM

## 2017-02-06 DIAGNOSIS — J309 Allergic rhinitis, unspecified: Secondary | ICD-10-CM

## 2017-02-06 LAB — POCT GLYCOSYLATED HEMOGLOBIN (HGB A1C): HEMOGLOBIN A1C: 5.3

## 2017-02-06 MED ORDER — METHYLPREDNISOLONE ACETATE 80 MG/ML IJ SUSP
80.0000 mg | Freq: Once | INTRAMUSCULAR | Status: AC
  Administered 2017-02-06: 80 mg via INTRAMUSCULAR

## 2017-02-06 MED ORDER — PREDNISONE 10 MG PO TABS: ORAL_TABLET | ORAL | 0 refills | Status: DC

## 2017-02-06 NOTE — Progress Notes (Signed)
Subjective:    Patient ID: [name], female    DOB: 1956/03/20, 61 y.o.   MRN: [id number]  HPI   Here to f/u; overall doing ok,  Pt denies chest pain, increasing sob or doe, wheezing, orthopnea, PND, increased LE swelling, palpitations, dizziness or syncope.  Pt denies new neurological symptoms such as new headache, or facial or extremity weakness or numbness.  Pt denies polydipsia, polyuria, or low sugar episode.  Pt states overall good compliance with meds, mostly trying to follow appropriate diet, with wt overall stable,  but little exercise however. Does have several wks ongoing nasal allergy symptoms with clearish congestion, itch and sneezing, without fever, pain, ST, cough, swelling or wheezing.  nasacort not controlling symptoms. Also with bilat ear pain and poppinc, crackling Past Medical History:  Diagnosis Date  . Allergic rhinitis   . Arthritis   . Brain aneurysm   . Chronic abdominal pain   . COPD (chronic obstructive pulmonary disease) ([place])    denies  . Depression    bipolar  . Fibromyalgia   . Gastroparesis   . GERD (gastroesophageal reflux disease)   . H/O hiatal hernia   . History of colonic polyps   . HLD (hyperlipidemia)   . IBS (irritable bowel syndrome)    chronic constipation  . Migraines   . OCD (obsessive compulsive disorder)    anxiety  . Peripheral neuropathy    Past Surgical History:  Procedure Laterality Date  . ABDOMINAL HYSTERECTOMY    . APPENDECTOMY    . CERVICAL DISC SURGERY    . cholecystectomy    . coil to brain aneurysm    . COLONOSCOPY    . ELBOW SURGERY Bilateral    tendonitis  . NECK SURGERY    . OOPHORECTOMY    . RADIOLOGY WITH ANESTHESIA N/A 05/10/2013   Procedure: RADIOLOGY WITH ANESTHESIA;  Surgeon: Rob Hickman, MD;  Location: [facility];  Service: Radiology;  Laterality: N/A;  . SHOULDER SURGERY Left   . TUBAL LIGATION    . VESICOVAGINAL FISTULA CLOSURE W/ TAH      reports that she has been smoking Cigarettes.  She  has a 39.00 pack-year smoking history. She has never used smokeless tobacco. She reports that she does not drink alcohol or use drugs. family history includes Cancer in her father, mother, sister, and unknown relative; Coronary artery disease in her unknown relative; Diabetes in her unknown relative; Hypertension in her unknown relative; Leukemia in her sister; Seizures in her unknown relative. Allergies  Allergen Reactions  . Ambien [Zolpidem Tartrate] Other (See Comments)    Memory issues  . Hydrocodone Nausea Only    Can take it if its in a cough syrup   . Nsaids     REACTION: GI irritation  . Propoxyphene N-Acetaminophen Itching  . Tramadol     REACTION: nausea  . Varenicline Tartrate     REACTION: agitation, depression, sick to stomach  . Chantix [Varenicline] Other (See Comments)    "Makes bipolar worse"  . Other Other (See Comments)    Lactose intolerant   Current Outpatient Prescriptions on File Prior to Visit  Medication Sig Dispense Refill  . albuterol (PROAIR HFA) 108 (90 Base) MCG/ACT inhaler INHALE 2 PUFFS INTO THE LUNGS EVERY 6 HOURS AS NEEDED FOR WHEEZING ORSHORTNESS OF BREATH. 8.5 g 11  . aspirin 325 MG tablet Take 325 mg by mouth daily.    Marland Kitchen atorvastatin (LIPITOR) 40 MG tablet TAKE 1 TABLET BY MOUTH  DAILY 90 tablet 0  . Cholecalciferol (VITAMIN D3) 1000 UNITS CAPS Take 1,000 Units by mouth daily.     Marland Kitchen conjugated estrogens (PREMARIN) vaginal cream Place 1 Applicatorful vaginally daily. 42.5 g 12  . cyanocobalamin 1000 MCG tablet Take 1,000 mcg by mouth daily.     . famotidine (PEPCID) 20 MG tablet TAKE 1 TABLET BY MOUTH  DAILY 90 tablet 1  . gabapentin (NEURONTIN) 300 MG capsule 2 tabs by mouth three time daily 180 capsule 5  . omeprazole (PRILOSEC) 40 MG capsule Take 40 mg by mouth.    . Oxycodone HCl 20 MG TABS     . potassium chloride (K-DUR,KLOR-CON) 10 MEQ tablet TAKE 1 TABLET BY MOUTH  DAILY 90 tablet 1  . promethazine (PHENERGAN) 12.5 MG tablet Take 1 tablet  by mouth  every 6 hours as needed for nausea 90 tablet 0  . triamcinolone (NASACORT AQ) 55 MCG/ACT AERO nasal inhaler Place 2 sprays into the nose daily. 1 Inhaler 12   No current facility-administered medications on file prior to visit.    Review of Systems  Constitutional: Negative for other unusual diaphoresis or sweats HENT: Negative for ear discharge or swelling Eyes: Negative for other worsening visual disturbances Respiratory: Negative for stridor or other swelling  Gastrointestinal: Negative for worsening distension or other blood Genitourinary: Negative for retention or other urinary change Musculoskeletal: Negative for other MSK pain or swelling Skin: Negative for color change or other new lesions Neurological: Negative for worsening tremors and other numbness  Psychiatric/Behavioral: Negative for worsening agitation or other fatigue All other system neg per pt    Objective:   Physical Exam BP 102/68   Pulse (!) 103   Temp 98.7 F (37.1 C) (Oral)   Wt 150 lb 0.8 oz (68.1 kg)   SpO2 98%   BMI 24.97 kg/m  VS noted, not ill appearing Constitutional: Pt appears in NAD HENT: Head: NCAT.  Right Ear: External ear normal.  Left Ear: External ear normal.  Eyes: . Pupils are equal, round, and reactive to light. Conjunctivae and EOM are normal Nose: without d/c or deformity Bilat tm's with mild erythema.  Max sinus areas non tender.  Pharynx with mild erythema, no exudate Neck: Neck supple. Gross normal ROM, no tender LA or swelling or mass Cardiovascular: Normal rate and regular rhythm.   Pulmonary/Chest: Effort normal and breath sounds without rales or wheezing.  Abd:  Soft, NT, ND, + BS, no organomegaly Neurological: Pt is alert. At baseline orientation, motor grossly intact Skin: Skin is warm. No rashes, other new lesions, no LE edema Psychiatric: Pt behavior is normal without agitation  No other exam findings Lab Results  Component Value Date   WBC 8.5 08/01/2016    HGB 14.6 08/01/2016   HCT 42.9 08/01/2016   PLT 269.0 08/01/2016   GLUCOSE 115 (H) 08/01/2016   CHOL 119 08/01/2016   TRIG 62.0 08/01/2016   HDL 55.40 08/01/2016   LDLDIRECT 102.7 08/21/2011   LDLCALC 51 08/01/2016   ALT 15 08/01/2016   AST 12 08/01/2016   NA 137 08/01/2016   K 4.1 08/01/2016   CL 107 08/01/2016   CREATININE 0.90 08/01/2016   BUN 7 08/01/2016   CO2 27 08/01/2016   TSH 0.74 08/01/2016   INR 1.03 05/03/2013   HGBA1C 5.7 08/09/2016   POCT - a1c today -  POC HgB A1c  Order: [id number]  Status:  Final result Visible to patient:  No (Not Released) Dx:  Hyperglycemia  Component  10:30  Hemoglobin A1C 5.3            Assessment & Plan:

## 2017-02-06 NOTE — Assessment & Plan Note (Signed)
stable overall by history and exam, and pt to continue medical treatment as before,  to f/u any worsening symptoms or concerns 

## 2017-02-06 NOTE — Assessment & Plan Note (Signed)
a1c is normal, stable overall by history and exam, recent data reviewed with pt, and pt to continue medical treatment as before,  to f/u any worsening symptoms or concerns

## 2017-02-06 NOTE — Assessment & Plan Note (Signed)
Mild to mod, Ok for mucinex bid prn,  to f/u any worsening symptoms or concerns

## 2017-02-06 NOTE — Assessment & Plan Note (Signed)
Mild to mod, for depomedrol IM 80, predpac asd,,  to f/u any worsening symptoms or concerns 

## 2017-02-06 NOTE — Patient Instructions (Signed)
Your A1c was OK today  You had the steroid shot today  Please take all new medication as prescribed - the prednisone  Please continue all other medications as before, including the nasacort  Please have the pharmacy call with any other refills you may need.  Please continue your efforts at being more active, low cholesterol diet, and weight control.  Please keep your appointments with your specialists as you may have planned  Please return in 6 months, or sooner if needed, with Lab testing done 3-5 days before

## 2017-02-10 ENCOUNTER — Other Ambulatory Visit: Payer: Self-pay | Admitting: Internal Medicine

## 2017-02-26 DIAGNOSIS — Z1231 Encounter for screening mammogram for malignant neoplasm of breast: Secondary | ICD-10-CM | POA: Diagnosis not present

## 2017-03-04 ENCOUNTER — Ambulatory Visit: Payer: Medicare Other

## 2017-03-04 DIAGNOSIS — M4726 Other spondylosis with radiculopathy, lumbar region: Secondary | ICD-10-CM | POA: Diagnosis not present

## 2017-03-04 DIAGNOSIS — M7712 Lateral epicondylitis, left elbow: Secondary | ICD-10-CM | POA: Diagnosis not present

## 2017-03-04 DIAGNOSIS — Z79891 Long term (current) use of opiate analgesic: Secondary | ICD-10-CM | POA: Diagnosis not present

## 2017-03-04 DIAGNOSIS — M5136 Other intervertebral disc degeneration, lumbar region: Secondary | ICD-10-CM | POA: Diagnosis not present

## 2017-03-04 DIAGNOSIS — M961 Postlaminectomy syndrome, not elsewhere classified: Secondary | ICD-10-CM | POA: Diagnosis not present

## 2017-03-04 DIAGNOSIS — M542 Cervicalgia: Secondary | ICD-10-CM | POA: Diagnosis not present

## 2017-03-11 ENCOUNTER — Ambulatory Visit: Payer: Medicare Other

## 2017-03-20 ENCOUNTER — Ambulatory Visit: Payer: Medicare Other

## 2017-03-20 ENCOUNTER — Telehealth: Payer: Self-pay | Admitting: Internal Medicine

## 2017-03-20 NOTE — Telephone Encounter (Signed)
levaquin is not normally a refillable chronic medication  Ok for OV if this is felt to be possibly needed, ok for addon if need be

## 2017-03-20 NOTE — Telephone Encounter (Signed)
Informed patient of MD response.   Patient states she has gotten sinus infection again.  Would appreciate anything Dr. Jenny Reichmann would be willing to send in. Patient did not want to make OV.

## 2017-03-21 ENCOUNTER — Encounter: Payer: Self-pay | Admitting: Internal Medicine

## 2017-03-21 NOTE — Telephone Encounter (Signed)
OV would be needed.

## 2017-05-05 DIAGNOSIS — M961 Postlaminectomy syndrome, not elsewhere classified: Secondary | ICD-10-CM | POA: Diagnosis not present

## 2017-05-05 DIAGNOSIS — Z79891 Long term (current) use of opiate analgesic: Secondary | ICD-10-CM | POA: Diagnosis not present

## 2017-05-05 DIAGNOSIS — M5136 Other intervertebral disc degeneration, lumbar region: Secondary | ICD-10-CM | POA: Diagnosis not present

## 2017-05-05 DIAGNOSIS — M542 Cervicalgia: Secondary | ICD-10-CM | POA: Diagnosis not present

## 2017-05-05 DIAGNOSIS — M4726 Other spondylosis with radiculopathy, lumbar region: Secondary | ICD-10-CM | POA: Diagnosis not present

## 2017-05-06 ENCOUNTER — Encounter: Payer: Self-pay | Admitting: Internal Medicine

## 2017-05-23 DIAGNOSIS — Z79899 Other long term (current) drug therapy: Secondary | ICD-10-CM | POA: Diagnosis not present

## 2017-06-06 IMAGING — CT CT ANGIO HEAD
1 of 10 series · 4 of 33 positions shown · IV contrast (OMNI 350)
Comparison: CTA head 12/24/2013, CTA neck 12/29/2013, and earlier.

CLINICAL DATA: 58-year-old female with multiple intracranial
aneurysms and intracranial stents. FMD. Right MCA, left supraclinoid
ICA, and bilateral SCA aneurysms. Subsequent encounter.

EXAM:
CT ANGIOGRAPHY HEAD
TECHNIQUE: Multidetector CT imaging of the head was performed using the
standard protocol during bolus administration of intravenous
contrast. Multiplanar CT image reconstructions and MIPs were
obtained to evaluate the vascular anatomy.
CONTRAST:  50mL OMNIPAQUE IOHEXOL 350 MG/ML SOLN

[Series 8: axial 1x1 · axial · 0.39mm/px · z∈[-135,-45]mm · 4 of 152 slices shown]
[im 31/152  soft-tissue]
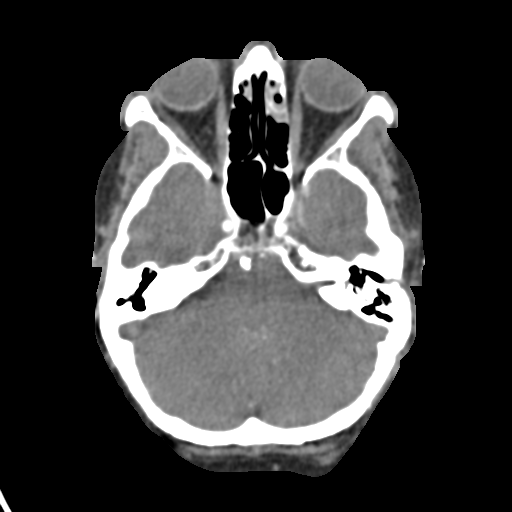
[im 61/152  bone]
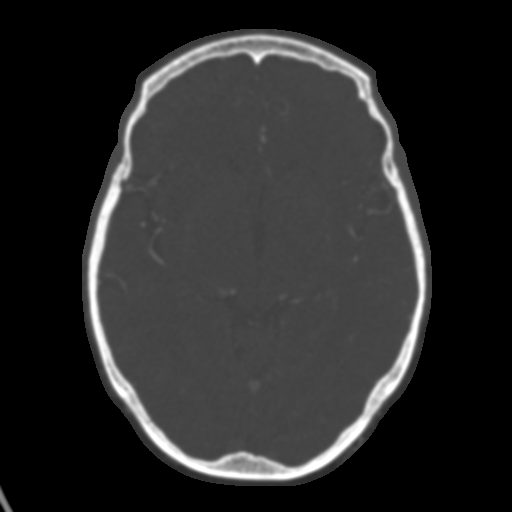
[im 91/152  soft-tissue]
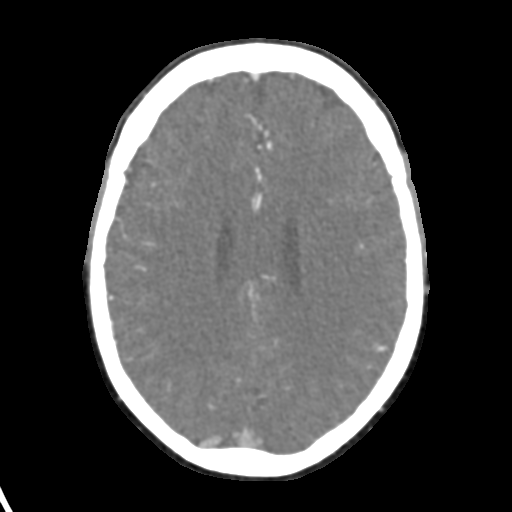
[im 121/152  bone]
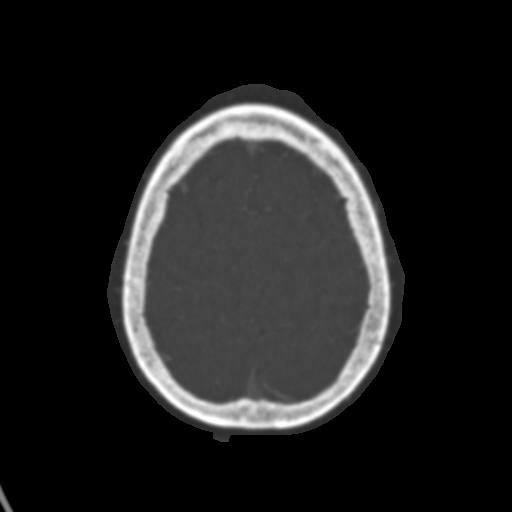

[4 of 33 positions shown; findings below may reference images not displayed]

FINDINGS: CT HEAD

Brain: Stable and normal cerebral volume. No ventriculomegaly. No
intracranial mass effect. Basilar and right PCA region vascular
stent and right MCA M1 region vascular stent re- identified. No
acute intracranial hemorrhage identified. No cortically based acute
infarct identified. Gray-white matter differentiation remains
normal.

Calvarium and skull base:  No acute osseous abnormality identified.

Paranasal sinuses: Stable visualized paranasal sinuses and mastoids.

Orbits: Visualized orbits and scalp soft tissues are within normal
limits.

CTA HEAD

Posterior circulation: Codominant distal vertebral arteries appear
stable and normal. Both PICA origins appear normal. Patent
vertebrobasilar junction.

Long segment basilar artery stent contiguous with a right PCA P1
segment stent re- identified. Difficult to visualize the stent lumen
by CTA, but the AICA, and PCAs remain patent. A small 3-4 mm
aneurysm arising at the right SCA origin region is stable since
7153. A subtle 1-2 mm saccular aneurysm arising adjacent to the left
PCA origin also is stable (series 9, image 99). The left P1 segment
is tortuous as before. Posterior communicating arteries are
diminutive or absent. Bilateral PCA branches are within normal
limits.

Anterior circulation: Stable distal cervical ICAs. Both ICA siphons
remain patent. Stable mild calcified plaque. Both ophthalmic artery
origins are within normal limits.

Small left ICA terminus posteriorly directed bilobed aneurysm or
adjacent 2-3 mm aneurysms versus infundibula are stable (series 8
image 43). The left MCA and ACA origins remain normal. Mildly early
branching of the left anterior temporal artery again noted. Left MCA
M1 segment, bifurcation, and left MCA branches are within normal
limits.

A1 segments, anterior communicating artery, and bilateral ACA
branches are stable and normal aside from mild tortuosity.

The right ICA terminus remains patent. There is a right MCA M1
segments stent which extends to the bifurcation, and appears patent.
Posteriorly and inferiorly directed from the bifurcation a small 2-3
mm aneurysm is best demonstrated on sagittal series 10, image 46 and
appears Stable. A more broad-based anteriorly directed up to 4 mm
aneurysm best seen on series 8, image 48 also appears stable. The
right MCA bifurcation and branches otherwise appear within normal
limits.

Venous sinuses: Patent.

Anatomic variants: None.

Delayed phase:No abnormal enhancement identified.
IMPRESSION: 1. Stable intracranial CTA since [DATE]. Patent appearing basilar-right PCA and right MCA M1 stents.
3. Multiple small, mostly 2-3 mm, intracranial aneurysms appear
stable. These are re- identified at the right SCA origin, left PCA
origin, left ICA terminus, and right MCA bifurcation as detailed
above.
4. No new intracranial abnormality.

## 2017-06-19 ENCOUNTER — Encounter: Payer: Self-pay | Admitting: Internal Medicine

## 2017-06-30 ENCOUNTER — Other Ambulatory Visit: Payer: Self-pay | Admitting: Internal Medicine

## 2017-07-02 DIAGNOSIS — M961 Postlaminectomy syndrome, not elsewhere classified: Secondary | ICD-10-CM | POA: Diagnosis not present

## 2017-07-02 DIAGNOSIS — M4726 Other spondylosis with radiculopathy, lumbar region: Secondary | ICD-10-CM | POA: Diagnosis not present

## 2017-07-02 DIAGNOSIS — M5136 Other intervertebral disc degeneration, lumbar region: Secondary | ICD-10-CM | POA: Diagnosis not present

## 2017-07-02 DIAGNOSIS — M542 Cervicalgia: Secondary | ICD-10-CM | POA: Diagnosis not present

## 2017-07-02 DIAGNOSIS — Z79891 Long term (current) use of opiate analgesic: Secondary | ICD-10-CM | POA: Diagnosis not present

## 2017-07-17 ENCOUNTER — Other Ambulatory Visit (HOSPITAL_COMMUNITY): Payer: Self-pay | Admitting: Interventional Radiology

## 2017-07-17 DIAGNOSIS — I729 Aneurysm of unspecified site: Secondary | ICD-10-CM

## 2017-07-22 ENCOUNTER — Other Ambulatory Visit: Payer: Self-pay | Admitting: Internal Medicine

## 2017-07-30 ENCOUNTER — Other Ambulatory Visit: Payer: Self-pay | Admitting: Radiology

## 2017-07-31 ENCOUNTER — Ambulatory Visit (HOSPITAL_COMMUNITY)
Admission: RE | Admit: 2017-07-31 | Discharge: 2017-07-31 | Disposition: A | Discharge status: Home / Self Care | Payer: Medicare Other | Source: Ambulatory Visit | Attending: Interventional Radiology | Admitting: Interventional Radiology

## 2017-07-31 ENCOUNTER — Other Ambulatory Visit (HOSPITAL_COMMUNITY): Payer: Self-pay | Admitting: Interventional Radiology

## 2017-07-31 ENCOUNTER — Encounter (HOSPITAL_COMMUNITY): Payer: Self-pay

## 2017-07-31 DIAGNOSIS — G43909 Migraine, unspecified, not intractable, without status migrainosus: Secondary | ICD-10-CM | POA: Insufficient documentation

## 2017-07-31 DIAGNOSIS — G629 Polyneuropathy, unspecified: Secondary | ICD-10-CM | POA: Diagnosis not present

## 2017-07-31 DIAGNOSIS — M1990 Unspecified osteoarthritis, unspecified site: Secondary | ICD-10-CM | POA: Insufficient documentation

## 2017-07-31 DIAGNOSIS — F429 Obsessive-compulsive disorder, unspecified: Secondary | ICD-10-CM | POA: Insufficient documentation

## 2017-07-31 DIAGNOSIS — F419 Anxiety disorder, unspecified: Secondary | ICD-10-CM | POA: Diagnosis not present

## 2017-07-31 DIAGNOSIS — F17210 Nicotine dependence, cigarettes, uncomplicated: Secondary | ICD-10-CM | POA: Insufficient documentation

## 2017-07-31 DIAGNOSIS — I671 Cerebral aneurysm, nonruptured: Secondary | ICD-10-CM | POA: Diagnosis not present

## 2017-07-31 DIAGNOSIS — Z7951 Long term (current) use of inhaled steroids: Secondary | ICD-10-CM | POA: Insufficient documentation

## 2017-07-31 DIAGNOSIS — K589 Irritable bowel syndrome without diarrhea: Secondary | ICD-10-CM | POA: Insufficient documentation

## 2017-07-31 DIAGNOSIS — F329 Major depressive disorder, single episode, unspecified: Secondary | ICD-10-CM | POA: Insufficient documentation

## 2017-07-31 DIAGNOSIS — K3184 Gastroparesis: Secondary | ICD-10-CM | POA: Insufficient documentation

## 2017-07-31 DIAGNOSIS — E785 Hyperlipidemia, unspecified: Secondary | ICD-10-CM | POA: Diagnosis not present

## 2017-07-31 DIAGNOSIS — J449 Chronic obstructive pulmonary disease, unspecified: Secondary | ICD-10-CM | POA: Insufficient documentation

## 2017-07-31 DIAGNOSIS — K5909 Other constipation: Secondary | ICD-10-CM | POA: Diagnosis not present

## 2017-07-31 DIAGNOSIS — Z7982 Long term (current) use of aspirin: Secondary | ICD-10-CM | POA: Diagnosis not present

## 2017-07-31 DIAGNOSIS — G8929 Other chronic pain: Secondary | ICD-10-CM | POA: Diagnosis not present

## 2017-07-31 DIAGNOSIS — K219 Gastro-esophageal reflux disease without esophagitis: Secondary | ICD-10-CM | POA: Insufficient documentation

## 2017-07-31 DIAGNOSIS — I729 Aneurysm of unspecified site: Secondary | ICD-10-CM

## 2017-07-31 DIAGNOSIS — M797 Fibromyalgia: Secondary | ICD-10-CM | POA: Insufficient documentation

## 2017-07-31 HISTORY — PX: IR ANGIO INTRA EXTRACRAN SEL COM CAROTID INNOMINATE BILAT MOD SED: IMG5360

## 2017-07-31 HISTORY — PX: IR ANGIO VERTEBRAL SEL VERTEBRAL BILAT MOD SED: IMG5369

## 2017-07-31 LAB — CBC
HEMATOCRIT: 45.3 % (ref 36.0–46.0)
Hemoglobin: 14.9 g/dL (ref 12.0–15.0)
MCH: 32.3 pg (ref 26.0–34.0)
MCHC: 32.9 g/dL (ref 30.0–36.0)
MCV: 98.1 fL (ref 78.0–100.0)
Platelets: 257 10*3/uL (ref 150–400)
RBC: 4.62 MIL/uL (ref 3.87–5.11)
RDW: 13.9 % (ref 11.5–15.5)
WBC: 6.5 10*3/uL (ref 4.0–10.5)

## 2017-07-31 LAB — PROTIME-INR
INR: 0.88
PROTHROMBIN TIME: 11.8 s (ref 11.4–15.2)

## 2017-07-31 LAB — BASIC METABOLIC PANEL
Anion gap: 11 (ref 5–15)
BUN: 7 mg/dL (ref 6–20)
CHLORIDE: 106 mmol/L (ref 101–111)
CO2: 24 mmol/L (ref 22–32)
CREATININE: 1.06 mg/dL — AB (ref 0.44–1.00)
Calcium: 9.1 mg/dL (ref 8.9–10.3)
GFR calc Af Amer: >60 mL/min (ref >60)
GFR calc non Af Amer: 56 mL/min — ABNORMAL LOW (ref >60)
Glucose, Bld: 121 mg/dL — ABNORMAL HIGH (ref 65–99)
POTASSIUM: 3.6 mmol/L (ref 3.5–5.1)
SODIUM: 141 mmol/L (ref 135–145)

## 2017-07-31 MED ORDER — HEPARIN SODIUM (PORCINE) 1000 UNIT/ML IJ SOLN
INTRAMUSCULAR | Status: AC | PRN
  Administered 2017-07-31: 1000 [IU] via INTRAVENOUS

## 2017-07-31 MED ORDER — MIDAZOLAM HCL 2 MG/2ML IJ SOLN
INTRAMUSCULAR | Status: AC
  Filled 2017-07-31: qty 2

## 2017-07-31 MED ORDER — HEPARIN SODIUM (PORCINE) 1000 UNIT/ML IJ SOLN
INTRAMUSCULAR | Status: AC
  Filled 2017-07-31: qty 1

## 2017-07-31 MED ORDER — LIDOCAINE HCL 1 % IJ SOLN
INTRAMUSCULAR | Status: AC
  Filled 2017-07-31: qty 20

## 2017-07-31 MED ORDER — MIDAZOLAM HCL 2 MG/2ML IJ SOLN
INTRAMUSCULAR | Status: AC | PRN
  Administered 2017-07-31: 1 mg via INTRAVENOUS

## 2017-07-31 MED ORDER — IOPAMIDOL (ISOVUE-300) INJECTION 61%
INTRAVENOUS | Status: AC
  Administered 2017-07-31: 90 mL
  Filled 2017-07-31: qty 50

## 2017-07-31 MED ORDER — FENTANYL CITRATE (PF) 100 MCG/2ML IJ SOLN
INTRAMUSCULAR | Status: AC | PRN
  Administered 2017-07-31 (×2): 12.5 ug via INTRAVENOUS
  Administered 2017-07-31: 25 ug via INTRAVENOUS

## 2017-07-31 MED ORDER — IOPAMIDOL (ISOVUE-300) INJECTION 61%
INTRAVENOUS | Status: AC
Start: 2017-07-31 — End: 2017-07-31
  Filled 2017-07-31: qty 150

## 2017-07-31 MED ORDER — FENTANYL CITRATE (PF) 100 MCG/2ML IJ SOLN
INTRAMUSCULAR | Status: AC
  Filled 2017-07-31: qty 2

## 2017-07-31 MED ORDER — LIDOCAINE HCL (PF) 1 % IJ SOLN
INTRAMUSCULAR | Status: AC | PRN
  Administered 2017-07-31: 10 mL

## 2017-07-31 MED ORDER — SODIUM CHLORIDE 0.9 % IV SOLN
Freq: Once | INTRAVENOUS | Status: AC
  Administered 2017-07-31: 07:00:00 via INTRAVENOUS

## 2017-07-31 NOTE — Sedation Documentation (Signed)
Patient is resting comfortably. 

## 2017-07-31 NOTE — Sedation Documentation (Signed)
6 french angio seal closure device deployed at 10:59 by Dr. Estanislado Pandy.

## 2017-07-31 NOTE — Sedation Documentation (Signed)
ETCO2 removed from pt per Dr. Estanislado Pandy request.

## 2017-07-31 NOTE — H&P (Signed)
Chief Complaint: Patient was seen in consultation today for cerebral aneurysms  Supervising Physician: Luanne Bras  Patient Status: [facility] - Out-pt  History of Present Illness: [name] is a 62 y.o. female with past medical history of COPD, fibromyalgia, GERD, HLD, IBS, and peripheral neuropathy who is known to neurointerventional radiology due to history of cerebral aneurysms s/p stent placements. Patient's most recent intervention was 04/2013 when she underwent stent placement of sidewall basilar aneurysm. She has been followed with regular CTAs.  Latest CTA 01/17/17 showed: 1. Patent carotid and vertebral artery is. No large vessel occlusion, aneurysm, or significant stenosis is identified. 2. Mild beaded irregularity of the right mid cervical ICA is stable and compatible with sequelae of fibromuscular dysplasia. 3. Severe emphysema and nodular scarring in lung apices is stable.  1. Patent right M1 and basilar stents. Mild stenosis of the right M1 at the proximal landing of the M1 stent. 2. Stable left carotid terminus, right MCA bifurcation, right SCA origin, and left PCA origin aneurysms. No new aneurysm identified. 3. No large vessel occlusion or high-grade stenosis.  Patient now presents for follow-up cerebral angiogram.  She presents in her usual state of health and denies recent changes to her symptoms.  She state she continues to have occasional headaches which are sometimes associated with blurred vision. She does have a history of migraines.  She has been NPO.  She does not take blood thinners.   Past Medical History:  Diagnosis Date  . Allergic rhinitis   . Arthritis   . Brain aneurysm   . Chronic abdominal pain   . COPD (chronic obstructive pulmonary disease) ([place])    denies  . Depression    bipolar  . Fibromyalgia   . Gastroparesis   . GERD (gastroesophageal reflux disease)   . H/O hiatal hernia   . History of colonic polyps   . HLD  (hyperlipidemia)   . IBS (irritable bowel syndrome)    chronic constipation  . Migraines   . OCD (obsessive compulsive disorder)    anxiety  . Peripheral neuropathy     Past Surgical History:  Procedure Laterality Date  . ABDOMINAL HYSTERECTOMY    . APPENDECTOMY    . CERVICAL DISC SURGERY    . cholecystectomy    . coil to brain aneurysm    . COLONOSCOPY    . ELBOW SURGERY Bilateral    tendonitis  . NECK SURGERY    . OOPHORECTOMY    . RADIOLOGY WITH ANESTHESIA N/A 05/10/2013   Procedure: RADIOLOGY WITH ANESTHESIA;  Surgeon: Rob Hickman, MD;  Location: [place];  Service: Radiology;  Laterality: N/A;  . SHOULDER SURGERY Left   . TUBAL LIGATION    . VESICOVAGINAL FISTULA CLOSURE W/ TAH      Allergies: Ambien [zolpidem tartrate]; Hydrocodone; Nsaids; Propoxyphene n-acetaminophen; Tramadol; Varenicline tartrate; Chantix [varenicline]; and Other  Medications: Prior to Admission medications   Medication Sig Start Date End Date Taking? Authorizing Provider  albuterol (PROAIR HFA) 108 (90 Base) MCG/ACT inhaler INHALE 2 PUFFS INTO THE LUNGS EVERY 6 HOURS AS NEEDED FOR WHEEZING ORSHORTNESS OF BREATH. 08/09/16  Yes Biagio Borg, MD  aspirin 325 MG tablet Take 325 mg by mouth daily.   Yes [provider]  atorvastatin (LIPITOR) 40 MG tablet TAKE 1 TABLET BY MOUTH  DAILY 07/22/17  Yes Biagio Borg, MD  Cholecalciferol (VITAMIN D3) 1000 UNITS CAPS Take 1,000 Units by mouth daily.    Yes [provider]  cyanocobalamin  1000 MCG tablet Take 1,000 mcg by mouth daily.    Yes [provider]  diclofenac (VOLTAREN) 75 MG EC tablet Take 75 mg by mouth 2 (two) times daily.    Yes [provider]  famotidine (PEPCID) 20 MG tablet TAKE 1 TABLET BY MOUTH  DAILY 06/30/17  Yes Biagio Borg, MD  lamoTRIgine (LAMICTAL) 100 MG tablet Take 300 mg by mouth at bedtime.   Yes [provider]  lamoTRIgine (LAMICTAL) 100 MG tablet Take 50 mg by mouth every  morning.   Yes [provider]  mirtazapine (REMERON) 30 MG tablet Take 30 mg by mouth at bedtime.   Yes [provider]  OLANZapine (ZYPREXA) 10 MG tablet Take 30 mg by mouth at bedtime.   Yes [provider]  Omega-3 Fatty Acids (FISH OIL) 1000 MG CAPS Take 1 capsule by mouth daily.   Yes [provider]  omeprazole (PRILOSEC) 40 MG capsule Take 40 mg by mouth 2 (two) times daily.    Yes [provider]  Oxycodone HCl 20 MG TABS Take 1 tablet by mouth every 6 (six) hours as needed (pain).  07/20/16  Yes [provider]  potassium chloride (K-DUR,KLOR-CON) 10 MEQ tablet TAKE 1 TABLET BY MOUTH  DAILY 07/22/17  Yes Biagio Borg, MD  promethazine (PHENERGAN) 12.5 MG tablet Take 1 tablet by mouth  every 6 hours as needed for nausea 08/01/15  Yes Biagio Borg, MD  risperidone (RISPERDAL) 4 MG tablet Take 8 mg by mouth at bedtime.    Yes [provider]  triamcinolone (NASACORT AQ) 55 MCG/ACT AERO nasal inhaler Place 2 sprays into the nose daily. 10/27/15   Biagio Borg, MD     Family History  Problem Relation Age of Onset  . Cancer Father        lung  . Cancer Mother        brain  . Coronary artery disease Unknown        family hx  . Cancer Unknown        family hx  . Diabetes Unknown        DM - family hx  . Hypertension Unknown        family hx  . Seizures Unknown        family hx  . Cancer Sister        stomach  . Leukemia Sister   . Colon cancer Neg Hx     Social History   Socioeconomic History  . Marital status: Widowed    Spouse name: None  . Number of children: None  . Years of education: None  . Highest education level: None  Social Needs  . Financial resource strain: None  . Food insecurity - worry: None  . Food insecurity - inability: None  . Transportation needs - medical: None  . Transportation needs - non-medical: None  Occupational History    Employer: DISABLED  Tobacco Use  . Smoking status:  Current Every Day Smoker    Packs/day: 1.50    Years: 26.00    Pack years: 39.00    Types: Cigarettes  . Smokeless tobacco: Never Used  . Tobacco comment: smokes 6 cigarettes/day   Substance and Sexual Activity  . Alcohol use: No  . Drug use: No  . Sexual activity: Not Currently  Other Topics Concern  . None  Social History Narrative   Widow. Does not work. Drinks coffee in AM     Review  of Systems  Constitutional: Negative for fatigue and fever.  Respiratory: Negative for cough and shortness of breath.   Cardiovascular: Negative for chest pain.  Gastrointestinal: Positive for abdominal pain (chronic, intermittent).  Neurological: Positive for headaches (occasional).  Psychiatric/Behavioral: Negative for behavioral problems and confusion.    Vital Signs: BP (!) 101/51   Pulse 87   Temp 97.7 F (36.5 C) (Oral)   Resp 16   Ht 5\' 5"  (1.651 m)   Wt 144 lb (65.3 kg)   SpO2 98%   BMI 23.96 kg/m   Physical Exam  Constitutional: She is oriented to person, place, and time. She appears well-developed.  Cardiovascular: Normal rate, regular rhythm and normal heart sounds.  Pulmonary/Chest: Effort normal and breath sounds normal. No respiratory distress.  Abdominal: Soft.  Neurological: She is alert and oriented to person, place, and time.  Skin: Skin is warm and dry.  Psychiatric: She has a normal mood and affect. Her behavior is normal. Judgment and thought content normal.  Nursing note and vitals reviewed.    MD Evaluation Airway: WNL Heart: WNL Abdomen: WNL Chest/ Lungs: WNL ASA  Classification: 3 Mallampati/Airway Score: One   Imaging: No results found.  Labs:  CBC: Recent Labs    08/01/16 1040 07/31/17 0640  WBC 8.5 6.5  HGB 14.6 14.9  HCT 42.9 45.3  PLT 269.0 257    COAGS: Recent Labs    07/31/17 0640  INR 0.88    BMP: Recent Labs    08/01/16 1040 07/31/17 0640  NA 137 141  K 4.1 3.6  CL 107 106  CO2 27 24  GLUCOSE 115* 121*  BUN 7  7  CALCIUM 9.6 9.1  CREATININE 0.90 1.06*  GFRNONAA  --  56*  GFRAA  --  >60    LIVER FUNCTION TESTS: Recent Labs    08/01/16 1040  BILITOT 0.4  AST 12  ALT 15  ALKPHOS 69  PROT 6.5  ALBUMIN 4.1    TUMOR MARKERS: No results for input(s): AFPTM, CEA, CA199, CHROMGRNA in the last 8760 hours.  Assessment and Plan: Patient with past medical history of cerebral aneurysms presents for follow-up surveillance and ongoing management.  Patient presents today in their usual state of health.  She has been NPO and is not currently on blood thinners.  Risks and benefits were discussed with the patient including, but not limited to bleeding, infection, vascular injury or contrast induced renal failure.  This interventional procedure involves the use of X-rays and because of the nature of the planned procedure, it is possible that we will have prolonged use of X-ray fluoroscopy.  Potential radiation risks to you include (but are not limited to) the following: - A slightly elevated risk for cancer  several years later in life. This risk is typically less than 0.5% percent. This risk is low in comparison to the normal incidence of human cancer, which is 33% for women and 50% for men according to the [place]. - Radiation induced injury can include skin redness, resembling a rash, tissue breakdown / ulcers and hair loss (which can be temporary or permanent).   The likelihood of either of these occurring depends on the difficulty of the procedure and whether you are sensitive to radiation due to previous procedures, disease, or genetic conditions.   IF your procedure requires a prolonged use of radiation, you will be notified and given written instructions for further action.  It is your responsibility to monitor the irradiated area for  the 2 weeks following the procedure and to notify your physician if you are concerned that you have suffered a radiation induced injury.    All of  the patient's questions were answered, patient is agreeable to proceed.  Consent signed and in chart.  Thank you for this interesting consult.  I greatly enjoyed meeting NETTY SULLIVANT and look forward to participating in their care.  A copy of this report was sent to the requesting provider on this date.  Electronically Signed: Docia Barrier, PA 07/31/2017, 8:59 AM   I spent a total of    25 Minutes in face to face in clinical consultation, greater than 50% of which was counseling/coordinating care for aneurysms.

## 2017-07-31 NOTE — Discharge Instructions (Addendum)
Cerebral Angiogram, Care After °Refer to this sheet in the next few weeks. These instructions provide you with information on caring for yourself after your procedure. Your health care provider may also give you more specific instructions. Your treatment has been planned according to current medical practices, but problems sometimes occur. Call your health care provider if you have any problems or questions after your procedure. °What can I expect after the procedure? °After your procedure, it is typical to have the following: °· Bruising at the catheter insertion site that usually fades within 1-2 weeks. °· Blood collecting in the tissue (hematoma) that may be painful to the touch. It should usually decrease in size and tenderness within 1-2 weeks. °· A mild headache. ° °Follow these instructions at home: °· Take medicines only as directed by your health care provider. °· You may shower 24-48 hours after the procedure or as directed by your health care provider. Remove the bandage (dressing) and gently wash the site with plain soap and water. Pat the area dry with a clean towel. Do not rub the site, because this may cause bleeding. °· Do not take baths, swim, or use a hot tub until your health care provider approves. °· Check your insertion site every day for redness, swelling, or drainage. °· Do not apply powder or lotion to the site. °· Do not lift over 10 lb (4.5 kg) for 5 days after your procedure or as directed by your health care provider. °· Ask your health care provider when it is okay to: °? Return to work or school. °? Resume usual physical activities or sports. °? Resume sexual activity. °· Do not drive home if you are discharged the same day as the procedure. Have someone else drive you. °· You may drive 24 hours after the procedure unless otherwise instructed by your health care provider. °· Do not operate machinery or power tools for 24 hours after the procedure or as directed by your health care  provider. °· If your procedure was done as an outpatient procedure, which means that you went home the same day as your procedure, a responsible adult should be with you for the first 24 hours after you arrive home. °· Keep all follow-up visits as directed by your health care provider. This is important. °Contact a health care provider if: °· You have a fever. °· You have chills. °· You have increased bleeding from the catheter insertion site. Hold pressure on the site. °Get help right away if: °· You have vision changes or loss of vision. °· You have numbness or weakness on one side of your body. °· You have difficulty talking, or you have slurred speech or cannot speak (aphasia). °· You feel confused or have difficulty remembering. °· You have unusual pain at the catheter insertion site. °· You have redness, warmth, or swelling at the catheter insertion site. °· You have drainage (other than a small amount of blood on the dressing) from the catheter insertion site. °· The catheter insertion site is bleeding, and the bleeding does not stop after 30 minutes of holding steady pressure on the site. °These symptoms may represent a serious problem that is an emergency. Do not wait to see if the symptoms will go away. Get medical help right away. Call your local emergency services (911 in [place] Do not drive yourself to the hospital. °This information is not intended to replace advice given to you by your health care provider. Make sure you discuss any questions   you have with your health care provider. °Document Released: 10/18/2013 Document Revised: 11/09/2015 Document Reviewed: 06/16/2013 °Elsevier Interactive Patient Education © 2017 [employer] °Moderate Conscious Sedation, Adult, Care After °These instructions provide you with information about caring for yourself after your procedure. Your health care provider may also give you more specific instructions. Your treatment has been planned according to current  medical practices, but problems sometimes occur. Call your health care provider if you have any problems or questions after your procedure. °What can I expect after the procedure? °After your procedure, it is common: °· To feel sleepy for several hours. °· To feel clumsy and have poor balance for several hours. °· To have poor judgment for several hours. °· To vomit if you eat too soon. ° °Follow these instructions at home: °For at least 24 hours after the procedure: ° °· Do not: °? Participate in activities where you could fall or become injured. °? Drive. °? Use heavy machinery. °? Drink alcohol. °? Take sleeping pills or medicines that cause drowsiness. °? Make important decisions or sign legal documents. °? Take care of children on your own. °· Rest. °Eating and drinking °· Follow the diet recommended by your health care provider. °· If you vomit: °? Drink water, juice, or soup when you can drink without vomiting. °? Make sure you have little or no nausea before eating solid foods. °General instructions °· Have a responsible adult stay with you until you are awake and alert. °· Take over-the-counter and prescription medicines only as told by your health care provider. °· If you smoke, do not smoke without supervision. °· Keep all follow-up visits as told by your health care provider. This is important. °Contact a health care provider if: °· You keep feeling nauseous or you keep vomiting. °· You feel light-headed. °· You develop a rash. °· You have a fever. °Get help right away if: °· You have trouble breathing. °This information is not intended to replace advice given to you by your health care provider. Make sure you discuss any questions you have with your health care provider. °Document Released: 03/24/2013 Document Revised: 11/06/2015 Document Reviewed: 09/23/2015 °Elsevier Interactive Patient Education © 2018 [employer] ° °

## 2017-07-31 NOTE — Procedures (Signed)
S?  4 vessel cerebral arteriogram Rt CFA approach. 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 3. 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

## 2017-08-04 ENCOUNTER — Encounter: Payer: Self-pay | Admitting: Internal Medicine

## 2017-08-04 ENCOUNTER — Encounter (HOSPITAL_COMMUNITY): Payer: Self-pay | Admitting: Interventional Radiology

## 2017-08-06 ENCOUNTER — Telehealth: Payer: Self-pay | Admitting: Internal Medicine

## 2017-08-06 NOTE — Telephone Encounter (Signed)
Spoke with Ms. Manthe regarding AWV. Patient stated that [facility] came to her home to complete her wellness visit for this year. Patient stated she will give office a call next year 2020 to schedule her wellness visit.

## 2017-08-07 ENCOUNTER — Other Ambulatory Visit (INDEPENDENT_AMBULATORY_CARE_PROVIDER_SITE_OTHER): Payer: Medicare Other

## 2017-08-07 DIAGNOSIS — Z0000 Encounter for general adult medical examination without abnormal findings: Secondary | ICD-10-CM | POA: Diagnosis not present

## 2017-08-07 DIAGNOSIS — R739 Hyperglycemia, unspecified: Secondary | ICD-10-CM | POA: Diagnosis not present

## 2017-08-07 LAB — CBC WITH DIFFERENTIAL/PLATELET
Basophils Absolute: 0 10*3/uL (ref 0.0–0.1)
Basophils Relative: 0.5 % (ref 0.0–3.0)
EOS PCT: 3.9 % (ref 0.0–5.0)
Eosinophils Absolute: 0.3 10*3/uL (ref 0.0–0.7)
HCT: 43.8 % (ref 36.0–46.0)
Hemoglobin: 14.8 g/dL (ref 12.0–15.0)
Lymphocytes Relative: 33.7 % (ref 12.0–46.0)
Lymphs Abs: 2.5 10*3/uL (ref 0.7–4.0)
MCHC: 33.7 g/dL (ref 30.0–36.0)
MCV: 97.3 fl (ref 78.0–100.0)
MONOS PCT: 8.9 % (ref 3.0–12.0)
Monocytes Absolute: 0.7 10*3/uL (ref 0.1–1.0)
NEUTROS PCT: 53 % (ref 43.0–77.0)
Neutro Abs: 3.9 10*3/uL (ref 1.4–7.7)
Platelets: 295 10*3/uL (ref 150.0–400.0)
RBC: 4.51 Mil/uL (ref 3.87–5.11)
RDW: 14.1 % (ref 11.5–15.5)
WBC: 7.5 10*3/uL (ref 4.0–10.5)

## 2017-08-07 LAB — URINALYSIS, ROUTINE W REFLEX MICROSCOPIC
Bilirubin Urine: NEGATIVE
Ketones, ur: NEGATIVE
Leukocytes, UA: NEGATIVE
Nitrite: NEGATIVE
PH: 6 (ref 5.0–8.0)
SPECIFIC GRAVITY, URINE: 1.01 (ref 1.000–1.030)
TOTAL PROTEIN, URINE-UPE24: NEGATIVE
URINE GLUCOSE: NEGATIVE
Urobilinogen, UA: 0.2 (ref 0.0–1.0)

## 2017-08-07 LAB — BASIC METABOLIC PANEL
BUN: 13 mg/dL (ref 6–23)
CO2: 28 meq/L (ref 19–32)
Calcium: 9 mg/dL (ref 8.4–10.5)
Chloride: 103 mEq/L (ref 96–112)
Creatinine, Ser: 1.09 mg/dL (ref 0.40–1.20)
GFR: 54.54 mL/min — AB (ref >60.00)
Glucose, Bld: 102 mg/dL — ABNORMAL HIGH (ref 70–99)
Potassium: 4.1 mEq/L (ref 3.5–5.1)
SODIUM: 138 meq/L (ref 135–145)

## 2017-08-07 LAB — TSH: TSH: 5.06 u[IU]/mL — AB (ref 0.35–4.50)

## 2017-08-07 LAB — LIPID PANEL
CHOLESTEROL: 122 mg/dL (ref 0–200)
HDL: 43.4 mg/dL (ref >39.00)
LDL Cholesterol: 60 mg/dL (ref 0–99)
NonHDL: 78.41
TRIGLYCERIDES: 91 mg/dL (ref 0.0–149.0)
Total CHOL/HDL Ratio: 3
VLDL: 18.2 mg/dL (ref 0.0–40.0)

## 2017-08-07 LAB — HEPATIC FUNCTION PANEL
ALBUMIN: 3.8 g/dL (ref 3.5–5.2)
ALT: 11 U/L (ref 0–35)
AST: 9 U/L (ref 0–37)
Alkaline Phosphatase: 74 U/L (ref 39–117)
Bilirubin, Direct: 0.1 mg/dL (ref 0.0–0.3)
Total Bilirubin: 0.4 mg/dL (ref 0.2–1.2)
Total Protein: 6.2 g/dL (ref 6.0–8.3)

## 2017-08-07 LAB — HEMOGLOBIN A1C: Hgb A1c MFr Bld: 5.4 % (ref 4.6–6.5)

## 2017-08-11 ENCOUNTER — Encounter: Payer: Self-pay | Admitting: Internal Medicine

## 2017-08-11 ENCOUNTER — Ambulatory Visit (INDEPENDENT_AMBULATORY_CARE_PROVIDER_SITE_OTHER): Payer: Medicare Other | Admitting: Internal Medicine

## 2017-08-11 VITALS — BP 102/62 | HR 115 | Temp 97.9°F | Ht 65.0 in | Wt 149.0 lb

## 2017-08-11 DIAGNOSIS — F17200 Nicotine dependence, unspecified, uncomplicated: Secondary | ICD-10-CM | POA: Diagnosis not present

## 2017-08-11 DIAGNOSIS — R1310 Dysphagia, unspecified: Secondary | ICD-10-CM

## 2017-08-11 DIAGNOSIS — M545 Low back pain, unspecified: Secondary | ICD-10-CM

## 2017-08-11 DIAGNOSIS — R1013 Epigastric pain: Secondary | ICD-10-CM | POA: Diagnosis not present

## 2017-08-11 DIAGNOSIS — J309 Allergic rhinitis, unspecified: Secondary | ICD-10-CM | POA: Diagnosis not present

## 2017-08-11 DIAGNOSIS — Z0000 Encounter for general adult medical examination without abnormal findings: Secondary | ICD-10-CM | POA: Diagnosis not present

## 2017-08-11 DIAGNOSIS — R079 Chest pain, unspecified: Secondary | ICD-10-CM | POA: Insufficient documentation

## 2017-08-11 DIAGNOSIS — R1031 Right lower quadrant pain: Secondary | ICD-10-CM | POA: Diagnosis not present

## 2017-08-11 DIAGNOSIS — G8929 Other chronic pain: Secondary | ICD-10-CM

## 2017-08-11 DIAGNOSIS — K219 Gastro-esophageal reflux disease without esophagitis: Secondary | ICD-10-CM

## 2017-08-11 MED ORDER — PANTOPRAZOLE SODIUM 40 MG PO TBEC: 40.0000 mg | DELAYED_RELEASE_TABLET | Freq: Two times a day (BID) | ORAL | 3 refills | Status: DC

## 2017-08-11 MED ORDER — VARENICLINE TARTRATE 0.5 MG X 11 & 1 MG X 42 PO MISC: ORAL | 0 refills | Status: DC

## 2017-08-11 MED ORDER — VARENICLINE TARTRATE 1 MG PO TABS: 1.0000 mg | ORAL_TABLET | Freq: Two times a day (BID) | ORAL | 1 refills | Status: DC

## 2017-08-11 MED ORDER — CYCLOBENZAPRINE HCL 5 MG PO TABS: 5.0000 mg | ORAL_TABLET | Freq: Three times a day (TID) | ORAL | 2 refills | Status: DC | PRN

## 2017-08-11 MED ORDER — CETIRIZINE HCL 10 MG PO TABS: 10.0000 mg | ORAL_TABLET | Freq: Every day | ORAL | 3 refills | Status: DC

## 2017-08-11 NOTE — Assessment & Plan Note (Signed)
Etiology unclear, for GI referral 

## 2017-08-11 NOTE — Patient Instructions (Signed)
OK to stop the prilosec (omeprazole)  Please take all new medication as prescribed - the protonix 40 mg twice per day (sent to [facility])  You will be contacted regarding the referral for: GI (urgent)  Your EKG was OK today  Please call if you change your mind about having the stress test  Please take all new medication as prescribed - the Zyrtec (to [facility])  We are not able to do the Chantix as you have tried in past with GI upset  Please take all new medication as prescribed - the muscle relaxer if needed  Please continue all other medications as before, and refills have been done if requested.  Please have the pharmacy call with any other refills you may need.  Please continue your efforts at being more active, low cholesterol diet, and weight control.  You are otherwise up to date with prevention measures today.  Please keep your appointments with your specialists as you may have planned  Please return in 6 months, or sooner if needed

## 2017-08-11 NOTE — Assessment & Plan Note (Signed)
to add zyrtec asd,  to f/u any worsening symptoms or concerns

## 2017-08-11 NOTE — Assessment & Plan Note (Signed)
Urged to quit, did not tolerate chantix before

## 2017-08-11 NOTE — Assessment & Plan Note (Signed)
With left lumbar paravertebral spasm, for flexeril prn

## 2017-08-11 NOTE — Progress Notes (Signed)
Subjective:    Patient ID: [name], female    DOB: [birth year], 62 y.o.   MRN: [id number]  HPI  Here for wellness and f/u;  Overall doing ok;  Pt denies Chest pain, worsening SOB, DOE, wheezing, orthopnea, PND, worsening LE edema, palpitations, dizziness or syncope.  Pt denies neurological change such as new headache, facial or extremity weakness.  Pt denies polydipsia, polyuria, or low sugar symptoms. Pt states overall good compliance with treatment and medications, good tolerability, and has been trying to follow appropriate diet.  Pt denies worsening depressive symptoms, suicidal ideation or panic. No fever, night sweats, wt loss, loss of appetite, or other constitutional symptoms.  Pt states good ability with ADL's, has low fall risk, home safety reviewed and adequate, no other significant changes in hearing or vision, and only occasionally active with exercise. Wt Readings from Last 3 Encounters:  08/11/17 149 lb (67.6 kg)  07/31/17 144 lb (65.3 kg)  02/06/17 150 lb 0.8 oz (68.1 kg)  Also c/o uncontrolled GERD despite bid pepcid 20 , prilosec 40 bid and prn TUMS.  Has recurring pain to the lower mid chest for about 2 mo, mod to severe, intermittent but quite frequent, worse at night with sour brash in back of throat and occas regurgitation of food.  Has infrequent n/v assoc, and no other abd pain except for ? Unrelated pain recurring to more of the RLQ.  TUMS helps only for a few minutes.  Wt is overall stable. Appetite ok. Has had several colonoscopy in past but no EGD, last seen per GI summer 2018 Dr Erlene Quan in [place]. Also has some difficulty swallowing, had a swallow test but not severe apparently per pt.   Asks for second opinion.  Also does have several wks ongoing nasal allergy symptoms with clearish congestion, itch and sneezing, without fever, pain, ST, cough, swelling or wheezing. Pt continues to have recurring left LBP without change in severity, bowel or bladder change, fever,  wt loss,  worsening LE pain/numbness/weakness, gait change or falls. Past Medical History:  Diagnosis Date  . Allergic rhinitis   . Arthritis   . Brain aneurysm   . Chronic abdominal pain   . COPD (chronic obstructive pulmonary disease) ([place])    denies  . Depression    bipolar  . Fibromyalgia   . Gastroparesis   . GERD (gastroesophageal reflux disease)   . H/O hiatal hernia   . History of colonic polyps   . HLD (hyperlipidemia)   . IBS (irritable bowel syndrome)    chronic constipation  . Migraines   . OCD (obsessive compulsive disorder)    anxiety  . Peripheral neuropathy    Past Surgical History:  Procedure Laterality Date  . ABDOMINAL HYSTERECTOMY    . APPENDECTOMY    . CERVICAL DISC SURGERY    . cholecystectomy    . coil to brain aneurysm    . COLONOSCOPY    . ELBOW SURGERY Bilateral    tendonitis  . IR ANGIO INTRA EXTRACRAN SEL COM CAROTID INNOMINATE BILAT MOD SED  07/31/2017  . IR ANGIO VERTEBRAL SEL VERTEBRAL BILAT MOD SED  07/31/2017  . NECK SURGERY    . OOPHORECTOMY    . RADIOLOGY WITH ANESTHESIA N/A 05/10/2013   Procedure: RADIOLOGY WITH ANESTHESIA;  Surgeon: Rob Hickman, MD;  Location: [place];  Service: Radiology;  Laterality: N/A;  . SHOULDER SURGERY Left   . TUBAL LIGATION    . VESICOVAGINAL FISTULA CLOSURE W/ TAH  reports that she has been smoking cigarettes.  She has a 39.00 pack-year smoking history. she has never used smokeless tobacco. She reports that she does not drink alcohol or use drugs. family history includes Cancer in her father, mother, sister, and unknown relative; Coronary artery disease in her unknown relative; Diabetes in her unknown relative; Hypertension in her unknown relative; Leukemia in her sister; Seizures in her unknown relative. Allergies  Allergen Reactions  . Ambien [Zolpidem Tartrate] Other (See Comments)    Memory issues  . Hydrocodone Nausea Only    Can take it if its in a cough syrup   . Nsaids     REACTION:  GI irritation  . Propoxyphene N-Acetaminophen Itching  . Tramadol     REACTION: nausea  . Varenicline Tartrate     REACTION: agitation, depression, sick to stomach  . Chantix [Varenicline] Other (See Comments)    "Makes bipolar worse"  . Other Other (See Comments)    Lactose intolerant   Current Outpatient Medications on File Prior to Visit  Medication Sig Dispense Refill  . albuterol (PROAIR HFA) 108 (90 Base) MCG/ACT inhaler INHALE 2 PUFFS INTO THE LUNGS EVERY 6 HOURS AS NEEDED FOR WHEEZING ORSHORTNESS OF BREATH. 8.5 g 11  . aspirin 325 MG tablet Take 325 mg by mouth daily.    Marland Kitchen atorvastatin (LIPITOR) 40 MG tablet TAKE 1 TABLET BY MOUTH  DAILY 90 tablet 2  . Cholecalciferol (VITAMIN D3) 1000 UNITS CAPS Take 1,000 Units by mouth daily.     . cyanocobalamin 1000 MCG tablet Take 1,000 mcg by mouth daily.     . diclofenac (VOLTAREN) 75 MG EC tablet Take 75 mg by mouth 2 (two) times daily.     . famotidine (PEPCID) 20 MG tablet TAKE 1 TABLET BY MOUTH  DAILY 90 tablet 2  . lamoTRIgine (LAMICTAL) 100 MG tablet Take 300 mg by mouth at bedtime.    . lamoTRIgine (LAMICTAL) 100 MG tablet Take 50 mg by mouth every morning.    . mirtazapine (REMERON) 30 MG tablet Take 30 mg by mouth at bedtime.    Marland Kitchen OLANZapine (ZYPREXA) 10 MG tablet Take 30 mg by mouth at bedtime.    . Omega-3 Fatty Acids (FISH OIL) 1000 MG CAPS Take 1 capsule by mouth daily.    . Oxycodone HCl 20 MG TABS Take 1 tablet by mouth every 6 (six) hours as needed (pain).     . potassium chloride (K-DUR,KLOR-CON) 10 MEQ tablet TAKE 1 TABLET BY MOUTH  DAILY 90 tablet 2  . promethazine (PHENERGAN) 12.5 MG tablet Take 1 tablet by mouth  every 6 hours as needed for nausea 90 tablet 0  . risperidone (RISPERDAL) 4 MG tablet Take 8 mg by mouth at bedtime.     . triamcinolone (NASACORT AQ) 55 MCG/ACT AERO nasal inhaler Place 2 sprays into the nose daily. 1 Inhaler 12   No current facility-administered medications on file prior to visit.     Review of Systems Constitutional: Negative for other unusual diaphoresis, sweats, appetite or weight changes HENT: Negative for other worsening hearing loss, ear pain, facial swelling, mouth sores or neck stiffness.   Eyes: Negative for other worsening pain, redness or other visual disturbance.  Respiratory: Negative for other stridor or swelling Cardiovascular: Negative for other palpitations or other chest pain  Gastrointestinal: Negative for worsening diarrhea or loose stools, blood in stool, distention or other pain Genitourinary: Negative for hematuria, flank pain or other change in urine volume.  Musculoskeletal: Negative  for myalgias or other joint swelling.  Skin: Negative for other color change, or other wound or worsening drainage.  Neurological: Negative for other syncope or numbness. Hematological: Negative for other adenopathy or swelling Psychiatric/Behavioral: Negative for hallucinations, other worsening agitation, SI, self-injury, or new decreased concentration All other system neg per pt    Objective:   Physical Exam BP 102/62   Pulse (!) 115   Temp 97.9 F (36.6 C) (Oral)   Ht 5\' 5"  (1.651 m)   Wt 149 lb (67.6 kg)   SpO2 97%   BMI 24.79 kg/m  VS noted,  Constitutional: Pt is oriented to person, place, and time. Appears well-developed and well-nourished, in no significant distress and comfortable Head: Normocephalic and atraumatic  Eyes: Conjunctivae and EOM are normal. Pupils are equal, round, and reactive to light Right Ear: External ear normal without discharge Left Ear: External ear normal without discharge Nose: Nose without discharge or deformity Mouth/Throat: Oropharynx is without other ulcerations and moist  Neck: Normal range of motion. Neck supple. No JVD present. No tracheal deviation present or significant neck LA or mass Cardiovascular: Normal rate, regular rhythm, normal heart sounds and intact distal pulses.   Bilat tm's with mild erythema.  Max  sinus areas non tender.  Pharynx with mild erythema, no exudate Pulmonary/Chest: WOB normal and breath sounds without rales or wheezing  Abdominal: Soft. Bowel sounds are normal, tender epigastric without guarding or rebound. No HSM  Musculoskeletal: Normal range of motion. Exhibits no edema, has tender left MSK lumbar paravertebral spasm Lymphadenopathy: Has no other cervical adenopathy.  Neurological: Pt is alert and oriented to person, place, and time. Pt has normal reflexes. No cranial nerve deficit. Motor grossly intact, Gait intact Skin: Skin is warm and dry. No rash noted or new ulcerations Psychiatric:  Has normal mood and affect. Behavior is normal without agitation No other exam findings  ECG today I have personally interpreted NSR with PVC    Assessment & Plan:

## 2017-08-11 NOTE — Assessment & Plan Note (Addendum)
Atypical, ecg reviewed, declines stress test for now  In addition to the time spent performing CPE, I spent an additional 25 minutes face to face,in which greater than 50% of this time was spent in counseling and coordination of care for patient's acute illness as documented, including the differential dx, treatment, further evaluation and other management of chest pain, dysphagia, epigastric pain, RLQ pain, LBP, allergy, and chronic smoking

## 2017-08-11 NOTE — Assessment & Plan Note (Signed)

## 2017-08-11 NOTE — Assessment & Plan Note (Signed)
[facility] for change prilosec to protonix 40 bid, refer GI

## 2017-08-15 ENCOUNTER — Encounter: Payer: Self-pay | Admitting: Internal Medicine

## 2017-08-27 DIAGNOSIS — M5136 Other intervertebral disc degeneration, lumbar region: Secondary | ICD-10-CM | POA: Diagnosis not present

## 2017-08-27 DIAGNOSIS — M961 Postlaminectomy syndrome, not elsewhere classified: Secondary | ICD-10-CM | POA: Diagnosis not present

## 2017-08-27 DIAGNOSIS — M542 Cervicalgia: Secondary | ICD-10-CM | POA: Diagnosis not present

## 2017-08-27 DIAGNOSIS — M4726 Other spondylosis with radiculopathy, lumbar region: Secondary | ICD-10-CM | POA: Diagnosis not present

## 2017-08-27 DIAGNOSIS — Z79891 Long term (current) use of opiate analgesic: Secondary | ICD-10-CM | POA: Diagnosis not present

## 2017-09-11 ENCOUNTER — Other Ambulatory Visit: Payer: Self-pay | Admitting: Internal Medicine

## 2017-09-17 DIAGNOSIS — Z79891 Long term (current) use of opiate analgesic: Secondary | ICD-10-CM | POA: Diagnosis not present

## 2017-10-23 DIAGNOSIS — M542 Cervicalgia: Secondary | ICD-10-CM | POA: Diagnosis not present

## 2017-10-23 DIAGNOSIS — M961 Postlaminectomy syndrome, not elsewhere classified: Secondary | ICD-10-CM | POA: Diagnosis not present

## 2017-10-23 DIAGNOSIS — Z79891 Long term (current) use of opiate analgesic: Secondary | ICD-10-CM | POA: Diagnosis not present

## 2017-10-23 DIAGNOSIS — M5136 Other intervertebral disc degeneration, lumbar region: Secondary | ICD-10-CM | POA: Diagnosis not present

## 2017-10-23 DIAGNOSIS — M4726 Other spondylosis with radiculopathy, lumbar region: Secondary | ICD-10-CM | POA: Diagnosis not present

## 2017-10-27 DIAGNOSIS — R1031 Right lower quadrant pain: Secondary | ICD-10-CM | POA: Diagnosis not present

## 2017-10-27 DIAGNOSIS — K581 Irritable bowel syndrome with constipation: Secondary | ICD-10-CM | POA: Diagnosis not present

## 2017-10-27 DIAGNOSIS — Z86010 Personal history of colonic polyps: Secondary | ICD-10-CM | POA: Diagnosis not present

## 2017-10-27 DIAGNOSIS — K224 Dyskinesia of esophagus: Secondary | ICD-10-CM | POA: Diagnosis not present

## 2017-10-27 DIAGNOSIS — K219 Gastro-esophageal reflux disease without esophagitis: Secondary | ICD-10-CM | POA: Diagnosis not present

## 2017-11-24 DIAGNOSIS — R1013 Epigastric pain: Secondary | ICD-10-CM | POA: Diagnosis not present

## 2017-11-24 DIAGNOSIS — K449 Diaphragmatic hernia without obstruction or gangrene: Secondary | ICD-10-CM | POA: Diagnosis not present

## 2017-12-08 ENCOUNTER — Encounter: Payer: Self-pay | Admitting: Internal Medicine

## 2017-12-08 MED ORDER — AMOXICILLIN 500 MG PO CAPS: 1000.0000 mg | ORAL_CAPSULE | Freq: Two times a day (BID) | ORAL | 0 refills | Status: DC

## 2017-12-11 DIAGNOSIS — Z79891 Long term (current) use of opiate analgesic: Secondary | ICD-10-CM | POA: Diagnosis not present

## 2017-12-11 DIAGNOSIS — M5136 Other intervertebral disc degeneration, lumbar region: Secondary | ICD-10-CM | POA: Diagnosis not present

## 2017-12-11 DIAGNOSIS — M4726 Other spondylosis with radiculopathy, lumbar region: Secondary | ICD-10-CM | POA: Diagnosis not present

## 2017-12-11 DIAGNOSIS — M961 Postlaminectomy syndrome, not elsewhere classified: Secondary | ICD-10-CM | POA: Diagnosis not present

## 2017-12-11 DIAGNOSIS — M542 Cervicalgia: Secondary | ICD-10-CM | POA: Diagnosis not present

## 2018-01-26 ENCOUNTER — Other Ambulatory Visit: Payer: Self-pay | Admitting: Internal Medicine

## 2018-02-09 ENCOUNTER — Encounter: Payer: Self-pay | Admitting: Internal Medicine

## 2018-02-09 ENCOUNTER — Other Ambulatory Visit (INDEPENDENT_AMBULATORY_CARE_PROVIDER_SITE_OTHER): Payer: Medicare Other

## 2018-02-09 ENCOUNTER — Ambulatory Visit (INDEPENDENT_AMBULATORY_CARE_PROVIDER_SITE_OTHER): Payer: Medicare Other | Admitting: Internal Medicine

## 2018-02-09 VITALS — BP 120/76 | HR 92 | Temp 98.0°F | Ht 65.0 in | Wt 142.0 lb

## 2018-02-09 DIAGNOSIS — R109 Unspecified abdominal pain: Secondary | ICD-10-CM

## 2018-02-09 DIAGNOSIS — R1031 Right lower quadrant pain: Secondary | ICD-10-CM

## 2018-02-09 DIAGNOSIS — R739 Hyperglycemia, unspecified: Secondary | ICD-10-CM

## 2018-02-09 LAB — LIPASE: Lipase: 7 U/L — ABNORMAL LOW (ref 11.0–59.0)

## 2018-02-09 LAB — HEPATIC FUNCTION PANEL
ALBUMIN: 4 g/dL (ref 3.5–5.2)
ALT: 10 U/L (ref 0–35)
AST: 10 U/L (ref 0–37)
Alkaline Phosphatase: 81 U/L (ref 39–117)
Bilirubin, Direct: 0.1 mg/dL (ref 0.0–0.3)
Total Bilirubin: 0.4 mg/dL (ref 0.2–1.2)
Total Protein: 6.5 g/dL (ref 6.0–8.3)

## 2018-02-09 LAB — CBC WITH DIFFERENTIAL/PLATELET
BASOS ABS: 0.1 10*3/uL (ref 0.0–0.1)
Basophils Relative: 0.6 % (ref 0.0–3.0)
Eosinophils Absolute: 0.1 10*3/uL (ref 0.0–0.7)
Eosinophils Relative: 0.8 % (ref 0.0–5.0)
HCT: 43.5 % (ref 36.0–46.0)
Hemoglobin: 14.5 g/dL (ref 12.0–15.0)
LYMPHS ABS: 1.5 10*3/uL (ref 0.7–4.0)
LYMPHS PCT: 16.1 % (ref 12.0–46.0)
MCHC: 33.3 g/dL (ref 30.0–36.0)
MCV: 95.8 fl (ref 78.0–100.0)
MONOS PCT: 9.2 % (ref 3.0–12.0)
Monocytes Absolute: 0.9 10*3/uL (ref 0.1–1.0)
NEUTROS PCT: 73.3 % (ref 43.0–77.0)
Neutro Abs: 6.8 10*3/uL (ref 1.4–7.7)
Platelets: 266 10*3/uL (ref 150.0–400.0)
RBC: 4.54 Mil/uL (ref 3.87–5.11)
RDW: 14.8 % (ref 11.5–15.5)
WBC: 9.4 10*3/uL (ref 4.0–10.5)

## 2018-02-09 LAB — BASIC METABOLIC PANEL
BUN: 10 mg/dL (ref 6–23)
CALCIUM: 9.3 mg/dL (ref 8.4–10.5)
CO2: 28 meq/L (ref 19–32)
CREATININE: 1.17 mg/dL (ref 0.40–1.20)
Chloride: 100 mEq/L (ref 96–112)
GFR: 50.17 mL/min — ABNORMAL LOW (ref >60.00)
GLUCOSE: 95 mg/dL (ref 70–99)
Potassium: 4.2 mEq/L (ref 3.5–5.1)
Sodium: 134 mEq/L — ABNORMAL LOW (ref 135–145)

## 2018-02-09 MED ORDER — CYCLOBENZAPRINE HCL 5 MG PO TABS: 5.0000 mg | ORAL_TABLET | Freq: Three times a day (TID) | ORAL | 2 refills | Status: DC | PRN

## 2018-02-09 NOTE — Patient Instructions (Signed)
Please continue all other medications as before, and refills have been done if requested.  Please have the pharmacy call with any other refills you may need.  Please keep your appointments with your specialists as you may have planned  You will be contacted regarding the referral for: CT scan  Please go to the LAB in the Basement (turn left off the elevator) for the tests to be done today  You will be contacted by phone if any changes need to be made immediately.  Otherwise, you will receive a letter about your results with an explanation, but please check with MyChart first.  Please remember to sign up for MyChart if you have not done so, as this will be important to you in the future with finding out test results, communicating by private email, and scheduling acute appointments online when needed.  Please return in 6 months, or sooner if needed, with Lab testing done 3-5 days before

## 2018-02-09 NOTE — Assessment & Plan Note (Signed)
Chronic persistent > 1 mo with mild localized tender, s/p appy and ccx - for Urine studies, labs as ordered, and CT abd/pelvis to r/o renal stone vs other

## 2018-02-09 NOTE — Assessment & Plan Note (Signed)
stable overall by history and exam, recent data reviewed with pt, and pt to continue medical treatment as before,  to f/u any worsening symptoms or concerns Lab Results  Component Value Date   HGBA1C 5.4 08/07/2017

## 2018-02-09 NOTE — Progress Notes (Signed)
Subjective:    Patient ID: [name], female    DOB: [birth year], 62 y.o.   MRN: [id number]  HPI    Here with c/o subacute RLQ pain with some radiation toward the right side and possibly right flank, mild to mod but persistent, constant, gradually worsening it seems, but Denies urinary symptoms such as dysuria, frequency, urgency, hematuria or n/v, fever, chills.  Pt denies chest pain, increased sob or doe, wheezing, orthopnea, PND, increased LE swelling, palpitations, dizziness or syncope.  Pt denies new neurological symptoms such as new headache, or facial or extremity weakness or numbness   Pt denies polydipsia, polyuria.f Denies worsening depressive symptoms, suicidal ideation, or panic Past Medical History:  Diagnosis Date  . Allergic rhinitis   . Arthritis   . Brain aneurysm   . Chronic abdominal pain   . COPD (chronic obstructive pulmonary disease) ([place])    denies  . Depression    bipolar  . Fibromyalgia   . Gastroparesis   . GERD (gastroesophageal reflux disease)   . H/O hiatal hernia   . History of colonic polyps   . HLD (hyperlipidemia)   . IBS (irritable bowel syndrome)    chronic constipation  . Migraines   . OCD (obsessive compulsive disorder)    anxiety  . Peripheral neuropathy    Past Surgical History:  Procedure Laterality Date  . ABDOMINAL HYSTERECTOMY    . APPENDECTOMY    . CERVICAL DISC SURGERY    . cholecystectomy    . coil to brain aneurysm    . COLONOSCOPY    . ELBOW SURGERY Bilateral    tendonitis  . IR ANGIO INTRA EXTRACRAN SEL COM CAROTID INNOMINATE BILAT MOD SED  07/31/2017  . IR ANGIO VERTEBRAL SEL VERTEBRAL BILAT MOD SED  07/31/2017  . NECK SURGERY    . OOPHORECTOMY    . RADIOLOGY WITH ANESTHESIA N/A 05/10/2013   Procedure: RADIOLOGY WITH ANESTHESIA;  Surgeon: Rob Hickman, MD;  Location: [facility];  Service: Radiology;  Laterality: N/A;  . SHOULDER SURGERY Left   . TUBAL LIGATION    . VESICOVAGINAL FISTULA CLOSURE W/ TAH      reports  that she has been smoking cigarettes. She has a 39.00 pack-year smoking history. She has never used smokeless tobacco. She reports that she does not drink alcohol or use drugs. family history includes Cancer in her father, mother, sister, and unknown relative; Coronary artery disease in her unknown relative; Diabetes in her unknown relative; Hypertension in her unknown relative; Leukemia in her sister; Seizures in her unknown relative. Allergies  Allergen Reactions  . Ambien [Zolpidem Tartrate] Other (See Comments)    Memory issues  . Hydrocodone Nausea Only    Can take it if its in a cough syrup   . Nsaids     REACTION: GI irritation  . Propoxyphene N-Acetaminophen Itching  . Tramadol     REACTION: nausea  . Varenicline Tartrate     REACTION: agitation, depression, sick to stomach  . Chantix [Varenicline] Other (See Comments)    "Makes bipolar worse"  . Other Other (See Comments)    Lactose intolerant   Current Outpatient Medications on File Prior to Visit  Medication Sig Dispense Refill  . albuterol (PROAIR HFA) 108 (90 Base) MCG/ACT inhaler INHALE 2 PUFFS INTO THE LUNGS EVERY 6 HOURS AS NEEDED FOR WHEEZING ORSHORTNESS OF BREATH. 8.5 g 11  . aspirin 325 MG tablet Take 325 mg by mouth daily.    Marland Kitchen atorvastatin (LIPITOR)  40 MG tablet TAKE 1 TABLET BY MOUTH  DAILY 90 tablet 1  . cetirizine (ZYRTEC) 10 MG tablet Take 1 tablet (10 mg total) by mouth daily. 90 tablet 3  . Cholecalciferol (VITAMIN D3) 1000 UNITS CAPS Take 1,000 Units by mouth daily.     . cyanocobalamin 1000 MCG tablet Take 1,000 mcg by mouth daily.     . diclofenac (VOLTAREN) 75 MG EC tablet Take 75 mg by mouth 2 (two) times daily.     . famotidine (PEPCID) 20 MG tablet TAKE 1 TABLET BY MOUTH  DAILY 90 tablet 1  . lamoTRIgine (LAMICTAL) 100 MG tablet Take 300 mg by mouth at bedtime.    . mirtazapine (REMERON) 30 MG tablet Take 30 mg by mouth at bedtime.    Marland Kitchen OLANZapine (ZYPREXA) 10 MG tablet Take 30 mg by mouth at bedtime.     . Omega-3 Fatty Acids (FISH OIL) 1000 MG CAPS Take 1 capsule by mouth daily.    . Oxycodone HCl 20 MG TABS Take 1 tablet by mouth every 6 (six) hours as needed (pain).     . pantoprazole (PROTONIX) 40 MG tablet Take 1 tablet (40 mg total) by mouth 2 (two) times daily before a meal. 180 tablet 3  . potassium chloride (K-DUR,KLOR-CON) 10 MEQ tablet TAKE 1 TABLET BY MOUTH  DAILY 90 tablet 2  . promethazine (PHENERGAN) 12.5 MG tablet Take 1 tablet by mouth  every 6 hours as needed for nausea 90 tablet 0  . risperidone (RISPERDAL) 4 MG tablet Take 8 mg by mouth at bedtime.     . triamcinolone (NASACORT AQ) 55 MCG/ACT AERO nasal inhaler Place 2 sprays into the nose daily. 1 Inhaler 12   No current facility-administered medications on file prior to visit.    Review of Systems  Constitutional: Negative for other unusual diaphoresis or sweats HENT: Negative for ear discharge or swelling Eyes: Negative for other worsening visual disturbances Respiratory: Negative for stridor or other swelling  Gastrointestinal: Negative for worsening distension or other blood Genitourinary: Negative for retention or other urinary change Musculoskeletal: Negative for other MSK pain or swelling Skin: Negative for color change or other new lesions Neurological: Negative for worsening tremors and other numbness  Psychiatric/Behavioral: Negative for worsening agitation or other fatigue All other system neg per pt    Objective:   Physical Exam BP 120/76   Pulse 92   Temp 98 F (36.7 C) (Oral)   Ht 5\' 5"  (1.651 m)   Wt 142 lb (64.4 kg)   SpO2 95%   BMI 23.63 kg/m  VS noted, fatigued in pain, non toxic Constitutional: Pt appears in NAD HENT: Head: NCAT.  Right Ear: External ear normal.  Left Ear: External ear normal.  Eyes: . Pupils are equal, round, and reactive to light. Conjunctivae and EOM are normal Nose: without d/c or deformity Neck: Neck supple. Gross normal ROM Cardiovascular: Normal rate and  regular rhythm.   Pulmonary/Chest: Effort normal and breath sounds without rales or wheezing.  Abd:  Soft, ND, + BS, no organomegaly, mild tender RLQ without guarding or rebound Neurological: Pt is alert. At baseline orientation, motor grossly intact Skin: Skin is warm. No rashes, other new lesions, no LE edema Psychiatric: Pt behavior is normal without agitation  No other exam findings  Lab Results  Component Value Date   WBC 7.5 08/07/2017   HGB 14.8 08/07/2017   HCT 43.8 08/07/2017   PLT 295.0 08/07/2017   GLUCOSE 102 (H) 08/07/2017  CHOL 122 08/07/2017   TRIG 91.0 08/07/2017   HDL 43.40 08/07/2017   LDLDIRECT 102.7 08/21/2011   LDLCALC 60 08/07/2017   ALT 11 08/07/2017   AST 9 08/07/2017   NA 138 08/07/2017   K 4.1 08/07/2017   CL 103 08/07/2017   CREATININE 1.09 08/07/2017   BUN 13 08/07/2017   CO2 28 08/07/2017   TSH 5.06 (H) 08/07/2017   INR 0.88 07/31/2017   HGBA1C 5.4 08/07/2017      Assessment & Plan:

## 2018-02-10 ENCOUNTER — Other Ambulatory Visit: Payer: Medicare Other

## 2018-02-11 ENCOUNTER — Ambulatory Visit (INDEPENDENT_AMBULATORY_CARE_PROVIDER_SITE_OTHER)
Admission: RE | Admit: 2018-02-11 | Discharge: 2018-02-11 | Disposition: A | Discharge status: Home / Self Care | Payer: Medicare Other | Source: Ambulatory Visit | Attending: Internal Medicine | Admitting: Internal Medicine

## 2018-02-11 ENCOUNTER — Other Ambulatory Visit (INDEPENDENT_AMBULATORY_CARE_PROVIDER_SITE_OTHER): Payer: Medicare Other

## 2018-02-11 DIAGNOSIS — K760 Fatty (change of) liver, not elsewhere classified: Secondary | ICD-10-CM | POA: Diagnosis not present

## 2018-02-11 DIAGNOSIS — R109 Unspecified abdominal pain: Secondary | ICD-10-CM

## 2018-02-11 DIAGNOSIS — R1031 Right lower quadrant pain: Secondary | ICD-10-CM | POA: Diagnosis not present

## 2018-02-11 LAB — URINALYSIS, ROUTINE W REFLEX MICROSCOPIC
Bilirubin Urine: NEGATIVE
Hgb urine dipstick: NEGATIVE
Ketones, ur: NEGATIVE
LEUKOCYTES UA: NEGATIVE
Nitrite: NEGATIVE
PH: 6 (ref 5.0–8.0)
RBC / HPF: NONE SEEN (ref 0–?)
SPECIFIC GRAVITY, URINE: 1.01 (ref 1.000–1.030)
TOTAL PROTEIN, URINE-UPE24: NEGATIVE
Urine Glucose: NEGATIVE
Urobilinogen, UA: 0.2 (ref 0.0–1.0)

## 2018-02-11 MED ORDER — IOPAMIDOL (ISOVUE-300) INJECTION 61%
100.0000 mL | Freq: Once | INTRAVENOUS | Status: AC | PRN
  Administered 2018-02-11: 100 mL via INTRAVENOUS

## 2018-02-12 DIAGNOSIS — M5136 Other intervertebral disc degeneration, lumbar region: Secondary | ICD-10-CM | POA: Diagnosis not present

## 2018-02-12 DIAGNOSIS — M4726 Other spondylosis with radiculopathy, lumbar region: Secondary | ICD-10-CM | POA: Diagnosis not present

## 2018-02-12 DIAGNOSIS — Z79891 Long term (current) use of opiate analgesic: Secondary | ICD-10-CM | POA: Diagnosis not present

## 2018-02-12 DIAGNOSIS — M542 Cervicalgia: Secondary | ICD-10-CM | POA: Diagnosis not present

## 2018-02-12 DIAGNOSIS — M961 Postlaminectomy syndrome, not elsewhere classified: Secondary | ICD-10-CM | POA: Diagnosis not present

## 2018-02-12 LAB — URINE CULTURE
MICRO NUMBER:: 91029753
SPECIMEN QUALITY:: ADEQUATE

## 2018-02-17 ENCOUNTER — Telehealth: Payer: Self-pay | Admitting: Internal Medicine

## 2018-02-17 MED ORDER — AMOXICILLIN 500 MG PO CAPS: 1000.0000 mg | ORAL_CAPSULE | Freq: Two times a day (BID) | ORAL | 0 refills | Status: DC

## 2018-02-17 NOTE — Telephone Encounter (Signed)
Done erx 

## 2018-02-17 NOTE — Telephone Encounter (Signed)
Copied from [place] [id number]. Topic: Quick Communication - Rx Refill/Question >> Feb 17, 2018  8:50 AM Oliver Pila B wrote: Medication: abx for an infected tooth  Pt called b/c she wants an abx for an infected tooth; pt states she has appt w/ dentist on the 9th this month

## 2018-02-27 ENCOUNTER — Ambulatory Visit: Payer: Medicare Other

## 2018-03-04 DIAGNOSIS — Z1231 Encounter for screening mammogram for malignant neoplasm of breast: Secondary | ICD-10-CM | POA: Diagnosis not present

## 2018-03-05 DIAGNOSIS — Z79891 Long term (current) use of opiate analgesic: Secondary | ICD-10-CM | POA: Diagnosis not present

## 2018-03-20 ENCOUNTER — Other Ambulatory Visit: Payer: Self-pay | Admitting: Internal Medicine

## 2018-04-09 DIAGNOSIS — M5136 Other intervertebral disc degeneration, lumbar region: Secondary | ICD-10-CM | POA: Diagnosis not present

## 2018-04-09 DIAGNOSIS — Z79891 Long term (current) use of opiate analgesic: Secondary | ICD-10-CM | POA: Diagnosis not present

## 2018-04-09 DIAGNOSIS — M4726 Other spondylosis with radiculopathy, lumbar region: Secondary | ICD-10-CM | POA: Diagnosis not present

## 2018-04-09 DIAGNOSIS — M961 Postlaminectomy syndrome, not elsewhere classified: Secondary | ICD-10-CM | POA: Diagnosis not present

## 2018-04-09 DIAGNOSIS — M542 Cervicalgia: Secondary | ICD-10-CM | POA: Diagnosis not present

## 2018-04-20 ENCOUNTER — Encounter: Payer: Self-pay | Admitting: Emergency Medicine

## 2018-04-27 ENCOUNTER — Other Ambulatory Visit: Payer: Self-pay

## 2018-04-27 MED ORDER — RISPERIDONE 4 MG PO TABS: ORAL_TABLET | ORAL | 0 refills | Status: DC

## 2018-05-06 ENCOUNTER — Ambulatory Visit (INDEPENDENT_AMBULATORY_CARE_PROVIDER_SITE_OTHER): Payer: Medicare Other | Admitting: Psychiatry

## 2018-05-06 DIAGNOSIS — F3289 Other specified depressive episodes: Secondary | ICD-10-CM

## 2018-05-06 DIAGNOSIS — G2401 Drug induced subacute dyskinesia: Secondary | ICD-10-CM

## 2018-05-06 DIAGNOSIS — F411 Generalized anxiety disorder: Secondary | ICD-10-CM

## 2018-05-06 DIAGNOSIS — F259 Schizoaffective disorder, unspecified: Secondary | ICD-10-CM | POA: Diagnosis not present

## 2018-05-06 MED ORDER — OLANZAPINE 10 MG PO TABS: 30.0000 mg | ORAL_TABLET | Freq: Every day | ORAL | 3 refills | Status: DC

## 2018-05-06 MED ORDER — BENZTROPINE MESYLATE 1 MG PO TABS: 1.0000 mg | ORAL_TABLET | Freq: Two times a day (BID) | ORAL | 0 refills | Status: DC

## 2018-05-06 MED ORDER — RISPERIDONE 4 MG PO TABS: ORAL_TABLET | ORAL | 0 refills | Status: DC

## 2018-05-06 MED ORDER — LAMOTRIGINE 100 MG PO TABS: ORAL_TABLET | ORAL | 0 refills | Status: DC

## 2018-05-06 NOTE — Progress Notes (Signed)
[facility]  Patient ID: [name],  MRN: [id number]  PCP: Biagio Borg, MD  Date of Evaluation: 05/06/2018 Time spent:20 minutes  Chief Complaint:   HISTORY/CURRENT STATUS: HPI   patient is a 62 year old white female last seen 01/30/2018.  Diagnoses include schizoaffective disorder, depression, anxiety with panic attack.  At her last visit she was having rapid cycles.  Denies suicidal thoughts ideation.  Continues to have auditory hallucinations and possible paranoia.  At that time we increased her Risperdal to 12 mg a day had her continue her Remeron and her Zyprexa and Lamictal.  We started Cogentin for tremor 0.5 mg twice daily  we continued her Ingrezza 80 mg a day. Currently she is doing about the same perhaps a little bit better.  No manic symptoms.  Some depression where she isolates decreased energy decreased motivation decreased ADLs.  No suicidal thoughts.  Notes that she stays in her room 23 out of 24 hours a day because she feels safe there.  Psychosis she still hears outside voices.  She states her tremors are better.  In normal conversation.  No visual hallucinations or delusions.  Paranoia feels like people are talking about her.  These are no worse   Individual Medical History/ Review of Systems: Changes? :brain aneurism in 2/19. Not repaired, just watching. Increased cholesterol   Often skips meals. Can go all day without eating.  Allergies: Ambien [zolpidem tartrate]; Hydrocodone; Nsaids; Propoxyphene n-acetaminophen; Tramadol; Varenicline tartrate; Chantix [varenicline]; and Other  Current Medications:  Current Outpatient Medications:  .  albuterol (PROAIR HFA) 108 (90 Base) MCG/ACT inhaler, INHALE 2 PUFFS INTO THE LUNGS EVERY 6 HOURS AS NEEDED FOR WHEEZING ORSHORTNESS OF BREATH., Disp: 8.5 g, Rfl: 11 .  aspirin 325 MG tablet, Take 325 mg by mouth daily., Disp: , Rfl:  .  atorvastatin (LIPITOR) 40 MG tablet, TAKE 1 TABLET BY MOUTH  DAILY, Disp: 90  tablet, Rfl: 1 .  Cholecalciferol (VITAMIN D3) 1000 UNITS CAPS, Take 1,000 Units by mouth daily. , Disp: , Rfl:  .  cyclobenzaprine (FLEXERIL) 5 MG tablet, Take 1 tablet (5 mg total) by mouth 3 (three) times daily as needed for muscle spasms., Disp: 30 tablet, Rfl: 2 .  famotidine (PEPCID) 20 MG tablet, TAKE 1 TABLET BY MOUTH  DAILY, Disp: 90 tablet, Rfl: 1 .  lamoTRIgine (LAMICTAL) 100 MG tablet, Take 300 mg by mouth at bedtime., Disp: , Rfl:  .  OLANZapine (ZYPREXA) 10 MG tablet, Take 3 tablets (30 mg total) by mouth at bedtime., Disp: 270 tablet, Rfl: 3 .  Omega-3 Fatty Acids (FISH OIL) 1000 MG CAPS, Take 1 capsule by mouth daily., Disp: , Rfl:  .  Oxycodone HCl 20 MG TABS, Take 1 tablet by mouth every 6 (six) hours as needed (pain). , Disp: , Rfl:  .  pantoprazole (PROTONIX) 40 MG tablet, Take 1 tablet (40 mg total) by mouth 2 (two) times daily before a meal., Disp: 180 tablet, Rfl: 3 .  potassium chloride (K-DUR,KLOR-CON) 10 MEQ tablet, TAKE 1 TABLET BY MOUTH  DAILY, Disp: 90 tablet, Rfl: 2 .  promethazine (PHENERGAN) 12.5 MG tablet, Take 1 tablet by mouth  every 6 hours as needed for nausea, Disp: 90 tablet, Rfl: 0 .  risperidone (RISPERDAL) 4 MG tablet, Take 1 tablet in the morning and 2 tablets at bedtime., Disp: 270 tablet, Rfl: 0 .  amoxicillin (AMOXIL) 500 MG capsule, Take 2 capsules (1,000 mg total) by mouth 2 (two) times daily. (Patient not taking:  Reported on 05/06/2018), Disp: 28 capsule, Rfl: 0 .  benztropine (COGENTIN) 1 MG tablet, Take 1 tablet (1 mg total) by mouth 2 (two) times daily., Disp: 180 tablet, Rfl: 0 .  cetirizine (ZYRTEC) 10 MG tablet, Take 1 tablet (10 mg total) by mouth daily. (Patient not taking: Reported on 05/06/2018), Disp: 90 tablet, Rfl: 3 .  cyanocobalamin 1000 MCG tablet, Take 1,000 mcg by mouth daily. , Disp: , Rfl:  .  diclofenac (VOLTAREN) 75 MG EC tablet, Take 75 mg by mouth 2 (two) times daily. , Disp: , Rfl:  .  lamoTRIgine (LAMICTAL) 100 MG tablet, 1  in am and 3 hs, Disp: 360 tablet, Rfl: 0 .  triamcinolone (NASACORT AQ) 55 MCG/ACT AERO nasal inhaler, Place 2 sprays into the nose daily. (Patient not taking: Reported on 05/06/2018), Disp: 1 Inhaler, Rfl: 12 .  Valbenazine Tosylate (INGREZZA) 80 MG CAPS, Take 80 mg by mouth daily., Disp: , Rfl:  Medication Side Effects: chin and left arm tremor. Ingrezza 80mg  for 2 months without benefit and too expensive.  Family Medical/ Social History: Changes? no  MENTAL HEALTH EXAM:  There were no vitals taken for this visit.There is no height or weight on file to calculate BMI.  General Appearance: Casual  Eye Contact:  Fair  Speech:  Normal Rate  Volume:  Normal  Mood:  Depressed  Affect:  Appropriate  Thought Process:  Linear  Orientation:  Full (Time, Place, and Person)  Thought Content: Logical   Suicidal Thoughts:  No  Homicidal Thoughts:  No  Memory:  WNL  Judgement:  Good  Insight:  Good  Psychomotor Activity:  Normal  Concentration:  Concentration: Good  Recall:  Good  Fund of Knowledge: Good  Language: Good  Assets:  Desire for Improvement  ADL's:  decreased  Cognition: WNL  Prognosis:  Fair    DIAGNOSES:    ICD-10-CM   1. Schizoaffective disorder, unspecified type ([place]) F25.9   2. Other depression F32.89   3. Anxiety state F41.1   4. Tardive dyskinesia G24.01     Receiving Psychotherapy: no  Has a left arm tremor and a chin tremor. RECOMMENDATIONS: pt with some depression without suicidal thoughts.  She continues to have auditory hallucinations.  Has some paranoia.  Patient desires to stay on same medications.  We will increase lamictal only because it will make it easier for her to take 100 mg 1 in the morning 3 at night, Zyprexa 10 mg 3 at night, Risperdal 4 mg 1 in the morning 2 at night, Cogentin 1 mg twice daily.  She was given 90-day prescription sent to [facility]      Comer Locket, PA-C

## 2018-05-13 ENCOUNTER — Other Ambulatory Visit: Payer: Self-pay

## 2018-05-13 NOTE — Telephone Encounter (Signed)
Opened in error

## 2018-06-03 DIAGNOSIS — M5136 Other intervertebral disc degeneration, lumbar region: Secondary | ICD-10-CM | POA: Diagnosis not present

## 2018-06-03 DIAGNOSIS — M542 Cervicalgia: Secondary | ICD-10-CM | POA: Diagnosis not present

## 2018-06-03 DIAGNOSIS — M961 Postlaminectomy syndrome, not elsewhere classified: Secondary | ICD-10-CM | POA: Diagnosis not present

## 2018-06-03 DIAGNOSIS — M4726 Other spondylosis with radiculopathy, lumbar region: Secondary | ICD-10-CM | POA: Diagnosis not present

## 2018-06-03 DIAGNOSIS — Z79891 Long term (current) use of opiate analgesic: Secondary | ICD-10-CM | POA: Diagnosis not present

## 2018-07-07 ENCOUNTER — Other Ambulatory Visit: Payer: Self-pay | Admitting: Internal Medicine

## 2018-07-14 ENCOUNTER — Other Ambulatory Visit: Payer: Self-pay | Admitting: Psychiatry

## 2018-08-03 DIAGNOSIS — Z79891 Long term (current) use of opiate analgesic: Secondary | ICD-10-CM | POA: Diagnosis not present

## 2018-08-03 DIAGNOSIS — M4726 Other spondylosis with radiculopathy, lumbar region: Secondary | ICD-10-CM | POA: Diagnosis not present

## 2018-08-03 DIAGNOSIS — M542 Cervicalgia: Secondary | ICD-10-CM | POA: Diagnosis not present

## 2018-08-03 DIAGNOSIS — M961 Postlaminectomy syndrome, not elsewhere classified: Secondary | ICD-10-CM | POA: Diagnosis not present

## 2018-08-03 DIAGNOSIS — M5136 Other intervertebral disc degeneration, lumbar region: Secondary | ICD-10-CM | POA: Diagnosis not present

## 2018-08-06 ENCOUNTER — Ambulatory Visit: Payer: Medicare Other | Admitting: Psychiatry

## 2018-08-18 ENCOUNTER — Ambulatory Visit: Payer: Medicare Other | Admitting: Psychiatry

## 2018-08-24 ENCOUNTER — Telehealth: Payer: Self-pay | Admitting: Psychiatry

## 2018-08-24 ENCOUNTER — Encounter: Payer: Self-pay | Admitting: Internal Medicine

## 2018-08-24 NOTE — Telephone Encounter (Signed)
ok 

## 2018-08-24 NOTE — Telephone Encounter (Signed)
Patient called and said that she received a summons to be a juror and can't be around people. She will bring in the summons so a letter can be written.

## 2018-08-26 ENCOUNTER — Other Ambulatory Visit (HOSPITAL_COMMUNITY): Payer: Self-pay | Admitting: Interventional Radiology

## 2018-08-26 DIAGNOSIS — I729 Aneurysm of unspecified site: Secondary | ICD-10-CM

## 2018-09-08 ENCOUNTER — Ambulatory Visit (HOSPITAL_COMMUNITY): Payer: Medicare Other

## 2018-09-10 ENCOUNTER — Encounter: Payer: Medicare Other | Admitting: Internal Medicine

## 2018-09-10 ENCOUNTER — Ambulatory Visit: Payer: Medicare Other

## 2018-09-21 ENCOUNTER — Ambulatory Visit: Payer: Medicare Other | Admitting: Psychiatry

## 2018-10-01 DIAGNOSIS — M4726 Other spondylosis with radiculopathy, lumbar region: Secondary | ICD-10-CM | POA: Diagnosis not present

## 2018-10-01 DIAGNOSIS — M5136 Other intervertebral disc degeneration, lumbar region: Secondary | ICD-10-CM | POA: Diagnosis not present

## 2018-10-01 DIAGNOSIS — M542 Cervicalgia: Secondary | ICD-10-CM | POA: Diagnosis not present

## 2018-10-01 DIAGNOSIS — M961 Postlaminectomy syndrome, not elsewhere classified: Secondary | ICD-10-CM | POA: Diagnosis not present

## 2018-10-02 ENCOUNTER — Encounter: Payer: Self-pay | Admitting: Internal Medicine

## 2018-10-06 ENCOUNTER — Encounter: Payer: Medicare Other | Admitting: Internal Medicine

## 2018-10-09 ENCOUNTER — Encounter (HOSPITAL_COMMUNITY): Payer: Self-pay

## 2018-10-09 ENCOUNTER — Ambulatory Visit (HOSPITAL_COMMUNITY): Payer: Medicare Other

## 2018-10-19 ENCOUNTER — Ambulatory Visit: Payer: Medicare Other | Admitting: Psychiatry

## 2018-10-28 ENCOUNTER — Ambulatory Visit (HOSPITAL_COMMUNITY): Payer: Medicare Other

## 2018-11-10 ENCOUNTER — Other Ambulatory Visit: Payer: Self-pay

## 2018-11-10 ENCOUNTER — Ambulatory Visit (INDEPENDENT_AMBULATORY_CARE_PROVIDER_SITE_OTHER): Payer: Medicare Other | Admitting: Physician Assistant

## 2018-11-10 ENCOUNTER — Encounter: Payer: Self-pay | Admitting: Physician Assistant

## 2018-11-10 DIAGNOSIS — F411 Generalized anxiety disorder: Secondary | ICD-10-CM

## 2018-11-10 DIAGNOSIS — G2401 Drug induced subacute dyskinesia: Secondary | ICD-10-CM | POA: Diagnosis not present

## 2018-11-10 DIAGNOSIS — F259 Schizoaffective disorder, unspecified: Secondary | ICD-10-CM

## 2018-11-10 MED ORDER — ATORVASTATIN CALCIUM 40 MG PO TABS: 40.0000 mg | ORAL_TABLET | Freq: Every day | ORAL | 1 refills | Status: DC

## 2018-11-10 MED ORDER — POTASSIUM CHLORIDE CRYS ER 10 MEQ PO TBCR: 10.0000 meq | EXTENDED_RELEASE_TABLET | Freq: Every day | ORAL | 1 refills | Status: DC

## 2018-11-10 MED ORDER — LAMOTRIGINE 100 MG PO TABS: ORAL_TABLET | ORAL | 0 refills | Status: DC

## 2018-11-10 MED ORDER — BENZTROPINE MESYLATE 1 MG PO TABS: 1.0000 mg | ORAL_TABLET | Freq: Two times a day (BID) | ORAL | 0 refills | Status: DC

## 2018-11-10 MED ORDER — RISPERIDONE 4 MG PO TABS: ORAL_TABLET | ORAL | 0 refills | Status: DC

## 2018-11-10 NOTE — Progress Notes (Signed)
[facility]  Patient ID: [name],  MRN: [id number]  PCP: Biagio Borg, MD  Date of Evaluation: 11/10/18 Time spent:25 minutes  Chief Complaint:  Chief Complaint    Schizophrenia; Depression; Other     Virtual Visit via Telephone Note  I connected with patient by a video enabled telemedicine application or telephone, with their informed consent, and verified patient privacy and that I am speaking with the correct person using two identifiers.  I am private, in my home and the patient is home.  I discussed the limitations, risks, security and privacy concerns of performing an evaluation and management service by telephone and the availability of in person appointments. I also discussed with the patient that there may be a patient responsible charge related to this service. The patient expressed understanding and agreed to proceed.   I discussed the assessment and treatment plan with the patient. The patient was provided an opportunity to ask questions and all were answered. The patient agreed with the plan and demonstrated an understanding of the instructions.   The patient was advised to call back or seek an in-person evaluation if the symptoms worsen or if the condition fails to improve as anticipated.  I provided 25 minutes of non-face-to-face time during this encounter.  HISTORY/CURRENT STATUS: HPI for 50-month med check.  Patient is being transferred to my care after her provider and my colleague, Comer Locket, [place], passed away.  She has a diagnosis of schizoaffective disorder, anxiety and depression.  States her biggest problem right now is abnormal movements.  States that at times her tongue will move abnormally as well as her face.  She has no control over it.  She was tried on [place] last year but states it did not work.  She is on Cogentin for tremor and says that has helped a little bit with the hand tremor but not with the facial movements.  She denies any  trouble swallowing.  Has no lacerations in the buccal mucosa or tongue.  States it is embarrassing more than anything else.  Her mood has been pretty stable.  Has not been able to enjoy much but states it is because of the coronavirus pandemic and not being able to get out and do anything.  States she would not want to do much anyway however.  Her energy and motivation are low but stable for her.  Denies suicidal or homicidal thoughts.  She does not cry easily.  Denies manic symptoms in the past 3 to 4 months.  No increased energy with decreased need for sleep.  No increased spending, grandiosity, impulsivity or risky behavior.  Denies paranoia.  She does report that she sometimes hears voices of her roommate who recently moved out.  "I think it is because I have lived with her for so long that I still feel like she is in the house.  Other than that, I do not hear any voices or seeing things."  Denies dizziness, syncope, seizures, numbness, tingling, unsteady gait, slurred speech, confusion. Denies muscle or joint pain, stiffness, or dystonia.  Individual Medical History/ Review of Systems: Changes? :No    Past medications for mental health diagnoses include: Ingrezza and others (unknown)  Allergies: Ambien [zolpidem tartrate]; Hydrocodone; Nsaids; Propoxyphene n-acetaminophen; Tramadol; Varenicline tartrate; Chantix [varenicline]; and Other  Current Medications:  Current Outpatient Medications:  .  albuterol (PROAIR HFA) 108 (90 Base) MCG/ACT inhaler, INHALE 2 PUFFS INTO THE LUNGS EVERY 6 HOURS AS NEEDED FOR WHEEZING ORSHORTNESS OF  BREATH., Disp: 8.5 g, Rfl: 11 .  aspirin 325 MG tablet, Take 325 mg by mouth daily., Disp: , Rfl:  .  atorvastatin (LIPITOR) 40 MG tablet, Take 1 tablet (40 mg total) by mouth daily., Disp: 90 tablet, Rfl: 1 .  benztropine (COGENTIN) 1 MG tablet, Take 1 tablet (1 mg total) by mouth 2 (two) times daily., Disp: 180 tablet, Rfl: 0 .  Cholecalciferol (VITAMIN D3) 1000  UNITS CAPS, Take 1,000 Units by mouth daily. , Disp: , Rfl:  .  cyanocobalamin 1000 MCG tablet, Take 1,000 mcg by mouth daily. , Disp: , Rfl:  .  diclofenac (VOLTAREN) 75 MG EC tablet, Take 75 mg by mouth 2 (two) times daily. , Disp: , Rfl:  .  famotidine (PEPCID) 20 MG tablet, TAKE 1 TABLET BY MOUTH  DAILY, Disp: 90 tablet, Rfl: 1 .  lamoTRIgine (LAMICTAL) 100 MG tablet, 1 in am and 3 hs, Disp: 360 tablet, Rfl: 0 .  OLANZapine (ZYPREXA) 10 MG tablet, Take 3 tablets (30 mg total) by mouth at bedtime., Disp: 270 tablet, Rfl: 3 .  Omega-3 Fatty Acids (FISH OIL) 1000 MG CAPS, Take 1 capsule by mouth daily., Disp: , Rfl:  .  Oxycodone HCl 20 MG TABS, Take 1 tablet by mouth every 6 (six) hours as needed (pain). , Disp: , Rfl:  .  potassium chloride (K-DUR) 10 MEQ tablet, Take 1 tablet (10 mEq total) by mouth daily., Disp: 90 tablet, Rfl: 1 .  risperidone (RISPERDAL) 4 MG tablet, TAKE 1 TABLET BY MOUTH IN  THE MORNING AND 2 TABLETS  AT BEDTIME, Disp: 270 tablet, Rfl: 0 .  amoxicillin (AMOXIL) 500 MG capsule, Take 2 capsules (1,000 mg total) by mouth 2 (two) times daily. (Patient not taking: Reported on 05/06/2018), Disp: 28 capsule, Rfl: 0 .  cetirizine (ZYRTEC) 10 MG tablet, Take 1 tablet (10 mg total) by mouth daily. (Patient not taking: Reported on 05/06/2018), Disp: 90 tablet, Rfl: 3 .  cyclobenzaprine (FLEXERIL) 5 MG tablet, Take 1 tablet (5 mg total) by mouth 3 (three) times daily as needed for muscle spasms. (Patient not taking: Reported on 11/10/2018), Disp: 30 tablet, Rfl: 2 .  pantoprazole (PROTONIX) 40 MG tablet, Take 1 tablet (40 mg total) by mouth 2 (two) times daily before a meal., Disp: 180 tablet, Rfl: 3 .  promethazine (PHENERGAN) 12.5 MG tablet, Take 1 tablet by mouth  every 6 hours as needed for nausea (Patient not taking: Reported on 11/10/2018), Disp: 90 tablet, Rfl: 0 .  triamcinolone (NASACORT AQ) 55 MCG/ACT AERO nasal inhaler, Place 2 sprays into the nose daily. (Patient not taking:  Reported on 05/06/2018), Disp: 1 Inhaler, Rfl: 12 Medication Side Effects: none  Family Medical/ Social History: Changes? Yes Coronavirus shelter in place, and she's bored.   MENTAL HEALTH EXAM:  There were no vitals taken for this visit.There is no height or weight on file to calculate BMI.  General Appearance: unable to assess  Eye Contact:  unable to assess  Speech:  Clear and Coherent  Volume:  Normal  Mood:  Euthymic  Affect:  unable to assess  Thought Process:  Goal Directed  Orientation:  Full (Time, Place, and Person)  Thought Content: Logical   Suicidal Thoughts:  No  Homicidal Thoughts:  No  Memory:  WNL  Judgement:  Good  Insight:  Good  Psychomotor Activity:  unable to assess  Concentration:  Concentration: Good  Recall:  Good  Fund of Knowledge: Good  Language: Good  Assets:  Desire for Improvement  ADL's:  Intact  Cognition: WNL  Prognosis:  Good    DIAGNOSES:    ICD-10-CM   1. Schizoaffective disorder, unspecified type ([place]) F25.9   2. Anxiety state F41.1   3. Tardive dyskinesia G24.01     Receiving Psychotherapy: No    RECOMMENDATIONS: I spent 25 minutes with her discussing differential diagnosis and treatment options.  We discussed [place] as an alternative to [place] for the tardive dyskinesia.  I would really like to see her face-to-face before prescribing that however.  She understands and agrees.  Continue Cogentin 1 mg twice daily. Continue Lamictal 100 mg every morning, and 300 mg nightly. Continue Zyprexa 30 mg nightly. Continue Risperdal 4 mg 1 every morning and 2 nightly. Return in 2-4 weeks for face-to-face visit.  Donnal Moat, PA-C   This record has been created using [employer].  Chart creation errors have been sought, but may not always have been located and corrected. Such creation errors do not reflect on the standard of medical care.

## 2018-11-17 ENCOUNTER — Encounter: Payer: Medicare Other | Admitting: Internal Medicine

## 2018-11-19 ENCOUNTER — Ambulatory Visit (HOSPITAL_COMMUNITY): Payer: Medicare Other

## 2018-11-19 ENCOUNTER — Ambulatory Visit (HOSPITAL_COMMUNITY): Admission: RE | Admit: 2018-11-19 | Payer: Medicare Other | Source: Ambulatory Visit

## 2018-11-20 ENCOUNTER — Other Ambulatory Visit (HOSPITAL_COMMUNITY): Payer: Self-pay | Admitting: Interventional Radiology

## 2018-11-20 DIAGNOSIS — I671 Cerebral aneurysm, nonruptured: Secondary | ICD-10-CM

## 2018-11-23 ENCOUNTER — Other Ambulatory Visit (INDEPENDENT_AMBULATORY_CARE_PROVIDER_SITE_OTHER): Payer: Medicare Other

## 2018-11-23 DIAGNOSIS — R1031 Right lower quadrant pain: Secondary | ICD-10-CM | POA: Diagnosis not present

## 2018-11-23 DIAGNOSIS — R739 Hyperglycemia, unspecified: Secondary | ICD-10-CM

## 2018-11-23 LAB — HEPATIC FUNCTION PANEL
ALT: 10 U/L (ref 0–35)
AST: 9 U/L (ref 0–37)
Albumin: 3.9 g/dL (ref 3.5–5.2)
Alkaline Phosphatase: 64 U/L (ref 39–117)
Bilirubin, Direct: 0.1 mg/dL (ref 0.0–0.3)
Total Bilirubin: 0.3 mg/dL (ref 0.2–1.2)
Total Protein: 6.2 g/dL (ref 6.0–8.3)

## 2018-11-23 LAB — LIPID PANEL
Cholesterol: 113 mg/dL (ref 0–200)
HDL: 48.6 mg/dL (ref >39.00)
LDL Cholesterol: 53 mg/dL (ref 0–99)
NonHDL: 64.22
Total CHOL/HDL Ratio: 2
Triglycerides: 58 mg/dL (ref 0.0–149.0)
VLDL: 11.6 mg/dL (ref 0.0–40.0)

## 2018-11-23 LAB — CBC WITH DIFFERENTIAL/PLATELET
Basophils Absolute: 0.1 10*3/uL (ref 0.0–0.1)
Basophils Relative: 1 % (ref 0.0–3.0)
Eosinophils Absolute: 0.2 10*3/uL (ref 0.0–0.7)
Eosinophils Relative: 3.7 % (ref 0.0–5.0)
HCT: 40.8 % (ref 36.0–46.0)
Hemoglobin: 13.8 g/dL (ref 12.0–15.0)
Lymphocytes Relative: 30.1 % (ref 12.0–46.0)
Lymphs Abs: 1.9 10*3/uL (ref 0.7–4.0)
MCHC: 33.8 g/dL (ref 30.0–36.0)
MCV: 97.8 fl (ref 78.0–100.0)
Monocytes Absolute: 0.7 10*3/uL (ref 0.1–1.0)
Monocytes Relative: 10.2 % (ref 3.0–12.0)
Neutro Abs: 3.6 10*3/uL (ref 1.4–7.7)
Neutrophils Relative %: 55 % (ref 43.0–77.0)
Platelets: 251 10*3/uL (ref 150.0–400.0)
RBC: 4.17 Mil/uL (ref 3.87–5.11)
RDW: 14.7 % (ref 11.5–15.5)
WBC: 6.5 10*3/uL (ref 4.0–10.5)

## 2018-11-23 LAB — BASIC METABOLIC PANEL
BUN: 7 mg/dL (ref 6–23)
CO2: 27 mEq/L (ref 19–32)
Calcium: 9.3 mg/dL (ref 8.4–10.5)
Chloride: 103 mEq/L (ref 96–112)
Creatinine, Ser: 0.97 mg/dL (ref 0.40–1.20)
GFR: 58.45 mL/min — ABNORMAL LOW (ref >60.00)
Glucose, Bld: 102 mg/dL — ABNORMAL HIGH (ref 70–99)
Potassium: 4.5 mEq/L (ref 3.5–5.1)
Sodium: 137 mEq/L (ref 135–145)

## 2018-11-23 LAB — HEMOGLOBIN A1C: Hgb A1c MFr Bld: 5.5 % (ref 4.6–6.5)

## 2018-11-23 LAB — TSH: TSH: 2.42 u[IU]/mL (ref 0.35–4.50)

## 2018-11-24 ENCOUNTER — Other Ambulatory Visit: Payer: Self-pay

## 2018-11-24 ENCOUNTER — Encounter: Payer: Medicare Other | Admitting: Internal Medicine

## 2018-11-26 ENCOUNTER — Ambulatory Visit (HOSPITAL_COMMUNITY): Payer: Medicare Other

## 2018-11-26 ENCOUNTER — Ambulatory Visit (INDEPENDENT_AMBULATORY_CARE_PROVIDER_SITE_OTHER): Payer: Medicare Other | Admitting: Internal Medicine

## 2018-11-26 ENCOUNTER — Encounter: Payer: Self-pay | Admitting: Internal Medicine

## 2018-11-26 ENCOUNTER — Ambulatory Visit (HOSPITAL_COMMUNITY): Admission: RE | Admit: 2018-11-26 | Payer: Medicare Other | Source: Ambulatory Visit

## 2018-11-26 ENCOUNTER — Other Ambulatory Visit: Payer: Self-pay

## 2018-11-26 VITALS — BP 114/68 | HR 101 | Temp 97.6°F | Ht 65.0 in | Wt 137.0 lb

## 2018-11-26 DIAGNOSIS — Z23 Encounter for immunization: Secondary | ICD-10-CM | POA: Diagnosis not present

## 2018-11-26 DIAGNOSIS — Z0000 Encounter for general adult medical examination without abnormal findings: Secondary | ICD-10-CM | POA: Diagnosis not present

## 2018-11-26 LAB — URINALYSIS, ROUTINE W REFLEX MICROSCOPIC
Bilirubin Urine: NEGATIVE
Hgb urine dipstick: NEGATIVE
Ketones, ur: NEGATIVE
Leukocytes,Ua: NEGATIVE
Nitrite: NEGATIVE
RBC / HPF: NONE SEEN (ref 0–?)
Specific Gravity, Urine: 1.01 (ref 1.000–1.030)
Total Protein, Urine: NEGATIVE
Urine Glucose: NEGATIVE
Urobilinogen, UA: 0.2 (ref 0.0–1.0)
pH: 7 (ref 5.0–8.0)

## 2018-11-26 MED ORDER — TRIAMCINOLONE ACETONIDE 0.1 % EX CREA: 1.0000 "application " | TOPICAL_CREAM | Freq: Two times a day (BID) | CUTANEOUS | 0 refills | Status: AC

## 2018-11-26 NOTE — Patient Instructions (Addendum)
You had the Tdap tetanus shot today  Please take all new medication as prescribed - the steroid cream for the right leg rash   Please continue all other medications as before, and refills have been done if requested.  Please have the pharmacy call with any other refills you may need.  Please continue your efforts at being more active, low cholesterol diet, and weight control.  You are otherwise up to date with prevention measures today.  Please keep your appointments with your specialists as you may have planned  Please return in 6 months, or sooner if needed

## 2018-11-26 NOTE — Assessment & Plan Note (Signed)

## 2018-11-26 NOTE — Progress Notes (Signed)
Subjective:    Patient ID: [name], female    DOB: [birth year], 63 y.o.   MRN: [id number]  HPI  Here for wellness and f/u;  Overall doing ok;  Pt denies Chest pain, worsening SOB, DOE, wheezing, orthopnea, PND, worsening LE edema, palpitations, dizziness or syncope.  Pt denies neurological change such as new headache, facial or extremity weakness.  Pt denies polydipsia, polyuria, or low sugar symptoms. Pt states overall good compliance with treatment and medications, good tolerability, and has been trying to follow appropriate diet.  Pt denies worsening depressive symptoms, suicidal ideation or panic. No fever, night sweats, wt loss, loss of appetite, or other constitutional symptoms.  Pt states good ability with ADL's, has low fall risk, home safety reviewed and adequate, no other significant changes in hearing or vision, and only occasionally active with exercise.  Due for Tdap. Also c/o 1 wk onset itchy red non raised rash to right leg laterally just below the knee.  Also, pt states ready now to quit smoking, but wanting to start with otc patch and gum. Past Medical History:  Diagnosis Date  . Allergic rhinitis   . Arthritis   . Brain aneurysm   . Chronic abdominal pain   . COPD (chronic obstructive pulmonary disease) ([place])    denies  . Depression    bipolar  . Fibromyalgia   . Gastroparesis   . GERD (gastroesophageal reflux disease)   . H/O hiatal hernia   . History of colonic polyps   . HLD (hyperlipidemia)   . IBS (irritable bowel syndrome)    chronic constipation  . Migraines   . OCD (obsessive compulsive disorder)    anxiety  . Peripheral neuropathy    Past Surgical History:  Procedure Laterality Date  . ABDOMINAL HYSTERECTOMY    . APPENDECTOMY    . CERVICAL DISC SURGERY    . cholecystectomy    . coil to brain aneurysm    . COLONOSCOPY    . ELBOW SURGERY Bilateral    tendonitis  . IR ANGIO INTRA EXTRACRAN SEL COM CAROTID INNOMINATE BILAT MOD SED  07/31/2017  .  IR ANGIO VERTEBRAL SEL VERTEBRAL BILAT MOD SED  07/31/2017  . NECK SURGERY    . OOPHORECTOMY    . RADIOLOGY WITH ANESTHESIA N/A 05/10/2013   Procedure: RADIOLOGY WITH ANESTHESIA;  Surgeon: Rob Hickman, MD;  Location: [facility];  Service: Radiology;  Laterality: N/A;  . SHOULDER SURGERY Left   . TUBAL LIGATION    . VESICOVAGINAL FISTULA CLOSURE W/ TAH      reports that she has been smoking cigarettes. She has a 39.00 pack-year smoking history. She has never used smokeless tobacco. She reports that she does not drink alcohol or use drugs. family history includes Cancer in her father, mother, sister, and unknown relative; Coronary artery disease in her unknown relative; Diabetes in her unknown relative; Hypertension in her unknown relative; Leukemia in her sister; Seizures in her unknown relative. Allergies  Allergen Reactions  . Ambien [Zolpidem Tartrate] Other (See Comments)    Memory issues  . Hydrocodone Nausea Only    Can take it if its in a cough syrup   . Nsaids     REACTION: GI irritation  . Propoxyphene N-Acetaminophen Itching  . Tramadol     REACTION: nausea  . Varenicline Tartrate     REACTION: agitation, depression, sick to stomach  . Chantix [Varenicline] Other (See Comments)    "Makes bipolar worse"  . Other Other (See  Comments)    Lactose intolerant   Current Outpatient Medications on File Prior to Visit  Medication Sig Dispense Refill  . albuterol (PROAIR HFA) 108 (90 Base) MCG/ACT inhaler INHALE 2 PUFFS INTO THE LUNGS EVERY 6 HOURS AS NEEDED FOR WHEEZING ORSHORTNESS OF BREATH. 8.5 g 11  . amoxicillin (AMOXIL) 500 MG capsule Take 2 capsules (1,000 mg total) by mouth 2 (two) times daily. 28 capsule 0  . aspirin 325 MG tablet Take 325 mg by mouth daily.    Marland Kitchen atorvastatin (LIPITOR) 40 MG tablet Take 1 tablet (40 mg total) by mouth daily. 90 tablet 1  . benztropine (COGENTIN) 1 MG tablet Take 1 tablet (1 mg total) by mouth 2 (two) times daily. 180 tablet 0  .  Cholecalciferol (VITAMIN D3) 1000 UNITS CAPS Take 1,000 Units by mouth daily.     . cyanocobalamin 1000 MCG tablet Take 1,000 mcg by mouth daily.     . cyclobenzaprine (FLEXERIL) 5 MG tablet Take 1 tablet (5 mg total) by mouth 3 (three) times daily as needed for muscle spasms. 30 tablet 2  . diclofenac (VOLTAREN) 75 MG EC tablet Take 75 mg by mouth 2 (two) times daily.     . famotidine (PEPCID) 20 MG tablet TAKE 1 TABLET BY MOUTH  DAILY 90 tablet 1  . lamoTRIgine (LAMICTAL) 100 MG tablet 1 in am and 3 hs 360 tablet 0  . OLANZapine (ZYPREXA) 10 MG tablet Take 3 tablets (30 mg total) by mouth at bedtime. 270 tablet 3  . Omega-3 Fatty Acids (FISH OIL) 1000 MG CAPS Take 1 capsule by mouth daily.    . Oxycodone HCl 20 MG TABS Take 1 tablet by mouth every 6 (six) hours as needed (pain).     . potassium chloride (K-DUR) 10 MEQ tablet Take 1 tablet (10 mEq total) by mouth daily. 90 tablet 1  . promethazine (PHENERGAN) 12.5 MG tablet Take 1 tablet by mouth  every 6 hours as needed for nausea 90 tablet 0  . risperidone (RISPERDAL) 4 MG tablet TAKE 1 TABLET BY MOUTH IN  THE MORNING AND 2 TABLETS  AT BEDTIME 270 tablet 0  . triamcinolone (NASACORT AQ) 55 MCG/ACT AERO nasal inhaler Place 2 sprays into the nose daily. 1 Inhaler 12  . cetirizine (ZYRTEC) 10 MG tablet Take 1 tablet (10 mg total) by mouth daily. (Patient not taking: Reported on 05/06/2018) 90 tablet 3  . pantoprazole (PROTONIX) 40 MG tablet Take 1 tablet (40 mg total) by mouth 2 (two) times daily before a meal. 180 tablet 3   No current facility-administered medications on file prior to visit.    Review of Systems Constitutional: Negative for other unusual diaphoresis, sweats, appetite or weight changes HENT: Negative for other worsening hearing loss, ear pain, facial swelling, mouth sores or neck stiffness.   Eyes: Negative for other worsening pain, redness or other visual disturbance.  Respiratory: Negative for other stridor or swelling  Cardiovascular: Negative for other palpitations or other chest pain  Gastrointestinal: Negative for worsening diarrhea or loose stools, blood in stool, distention or other pain Genitourinary: Negative for hematuria, flank pain or other change in urine volume.  Musculoskeletal: Negative for myalgias or other joint swelling.  Skin: Negative for other color change, or other wound or worsening drainage.  Neurological: Negative for other syncope or numbness. Hematological: Negative for other adenopathy or swelling Psychiatric/Behavioral: Negative for hallucinations, other worsening agitation, SI, self-injury, or new decreased concentration All other system neg per pt  Objective:   Physical Exam BP 114/68   Pulse (!) 101   Temp 97.6 F (36.4 C) (Oral)   Ht 5\' 5"  (1.651 m)   Wt 137 lb (62.1 kg)   SpO2 96%   BMI 22.80 kg/m  VS noted,  Constitutional: Pt is oriented to person, place, and time. Appears well-developed and well-nourished, in no significant distress and comfortable Head: Normocephalic and atraumatic  Eyes: Conjunctivae and EOM are normal. Pupils are equal, round, and reactive to light Right Ear: External ear normal without discharge Left Ear: External ear normal without discharge Nose: Nose without discharge or deformity Mouth/Throat: Oropharynx is without other ulcerations and moist  Neck: Normal range of motion. Neck supple. No JVD present. No tracheal deviation present or significant neck LA or mass Cardiovascular: Normal rate, regular rhythm, normal heart sounds and intact distal pulses.   Pulmonary/Chest: WOB normal and breath sounds without rales or wheezing  Abdominal: Soft. Bowel sounds are normal. NT. No HSM  Musculoskeletal: Normal range of motion. Exhibits no edema Lymphadenopathy: Has no other cervical adenopathy.  Neurological: Pt is alert and oriented to person, place, and time. Pt has normal reflexes. No cranial nerve deficit. Motor grossly intact, Gait intact  Skin: Skin is warm and dry. No rash noted or new ulcerations Psychiatric:  Has nervous mood and affect. Behavior is normal without agitation No other exam findings Lab Results  Component Value Date   WBC 6.5 11/23/2018   HGB 13.8 11/23/2018   HCT 40.8 11/23/2018   PLT 251.0 11/23/2018   GLUCOSE 102 (H) 11/23/2018   CHOL 113 11/23/2018   TRIG 58.0 11/23/2018   HDL 48.60 11/23/2018   LDLDIRECT 102.7 08/21/2011   LDLCALC 53 11/23/2018   ALT 10 11/23/2018   AST 9 11/23/2018   NA 137 11/23/2018   K 4.5 11/23/2018   CL 103 11/23/2018   CREATININE 0.97 11/23/2018   BUN 7 11/23/2018   CO2 27 11/23/2018   TSH 2.42 11/23/2018   INR 0.88 07/31/2017   HGBA1C 5.5 11/23/2018        Assessment & Plan:

## 2018-12-03 ENCOUNTER — Ambulatory Visit: Payer: Medicare Other | Admitting: Physician Assistant

## 2018-12-07 ENCOUNTER — Telehealth (HOSPITAL_COMMUNITY): Payer: Self-pay

## 2018-12-07 NOTE — Telephone Encounter (Signed)
Called to reschedule cta, no answer, left vm. AW °

## 2018-12-10 DIAGNOSIS — M542 Cervicalgia: Secondary | ICD-10-CM | POA: Diagnosis not present

## 2018-12-10 DIAGNOSIS — M5136 Other intervertebral disc degeneration, lumbar region: Secondary | ICD-10-CM | POA: Diagnosis not present

## 2018-12-10 DIAGNOSIS — M961 Postlaminectomy syndrome, not elsewhere classified: Secondary | ICD-10-CM | POA: Diagnosis not present

## 2018-12-10 DIAGNOSIS — M4726 Other spondylosis with radiculopathy, lumbar region: Secondary | ICD-10-CM | POA: Diagnosis not present

## 2018-12-10 DIAGNOSIS — Z79891 Long term (current) use of opiate analgesic: Secondary | ICD-10-CM | POA: Diagnosis not present

## 2018-12-15 ENCOUNTER — Other Ambulatory Visit: Payer: Self-pay

## 2018-12-15 ENCOUNTER — Ambulatory Visit (HOSPITAL_COMMUNITY): Payer: Medicare Other

## 2018-12-15 ENCOUNTER — Ambulatory Visit (HOSPITAL_COMMUNITY)
Admission: RE | Admit: 2018-12-15 | Discharge: 2018-12-15 | Disposition: A | Discharge status: Home / Self Care | Payer: Medicare Other | Source: Ambulatory Visit | Attending: Interventional Radiology | Admitting: Interventional Radiology

## 2018-12-15 DIAGNOSIS — I671 Cerebral aneurysm, nonruptured: Secondary | ICD-10-CM | POA: Diagnosis not present

## 2018-12-15 MED ORDER — IOHEXOL 350 MG/ML SOLN
100.0000 mL | Freq: Once | INTRAVENOUS | Status: AC | PRN
  Administered 2018-12-15: 100 mL via INTRAVENOUS

## 2018-12-16 ENCOUNTER — Telehealth (HOSPITAL_COMMUNITY): Payer: Self-pay

## 2018-12-16 NOTE — Telephone Encounter (Signed)
Left message for pt to f/u in 1 year. Call with questions. AW

## 2019-01-01 ENCOUNTER — Other Ambulatory Visit: Payer: Self-pay

## 2019-01-01 ENCOUNTER — Ambulatory Visit (INDEPENDENT_AMBULATORY_CARE_PROVIDER_SITE_OTHER): Payer: Medicare Other | Admitting: Physician Assistant

## 2019-01-01 ENCOUNTER — Encounter: Payer: Self-pay | Admitting: Physician Assistant

## 2019-01-01 DIAGNOSIS — F411 Generalized anxiety disorder: Secondary | ICD-10-CM | POA: Diagnosis not present

## 2019-01-01 DIAGNOSIS — F259 Schizoaffective disorder, unspecified: Secondary | ICD-10-CM

## 2019-01-01 DIAGNOSIS — G251 Drug-induced tremor: Secondary | ICD-10-CM | POA: Diagnosis not present

## 2019-01-01 MED ORDER — BENZTROPINE MESYLATE 1 MG PO TABS: 2.0000 mg | ORAL_TABLET | Freq: Three times a day (TID) | ORAL | 1 refills | Status: DC

## 2019-01-01 NOTE — Progress Notes (Signed)
[facility]  Patient ID: [name],  MRN: [id number]  PCP: Biagio Borg, MD  Date of Evaluation: 01/01/2019 Time spent:15 minutes  Chief Complaint:  Chief Complaint    Follow-up     Virtual Visit via Telephone Note  I connected with patient by a video enabled telemedicine application or telephone, with their informed consent, and verified patient privacy and that I am speaking with the correct person using two identifiers.  I am private, in my office and the patient is home.  I discussed the limitations, risks, security and privacy concerns of performing an evaluation and management service by telephone and the availability of in person appointments. I also discussed with the patient that there may be a patient responsible charge related to this service. The patient expressed understanding and agreed to proceed.   I discussed the assessment and treatment plan with the patient. The patient was provided an opportunity to ask questions and all were answered. The patient agreed with the plan and demonstrated an understanding of the instructions.   The patient was advised to call back or seek an in-person evaluation if the symptoms worsen or if the condition fails to improve as anticipated.  I provided 15 minutes of non-face-to-face time during this encounter.  HISTORY/CURRENT STATUS: HPI For routine med. She is transferring to my care after her provider and my colleague, [place]er Locket, [place], passed away a few months ago.   Still complains of tremor that comes and goes, is worse in her left hand.  She is left-handed, only writes with her left hand and does everything else with her right hand.  She will sometimes spill a drink for example.  This has been ongoing for over a year.  She has been on Cogentin and Ingrezza.  The Ingrezza did not help at all.  She is unsure if the Cogentin helped initially or not.  Other than the tremor she is not having any problems. Patient denies  loss of interest in usual activities and is able to enjoy things.  Denies decreased energy or motivation.  Appetite has not changed.  No extreme sadness, tearfulness, or feelings of hopelessness.  Denies any changes in concentration, making decisions or remembering things.  Denies suicidal or homicidal thoughts. Patient denies increased energy with decreased need for sleep, no increased talkativeness, no racing thoughts, no impulsivity or risky behaviors, no increased spending, no increased libido, no grandiosity.  Denies hallucinations.  Denies dizziness, syncope, seizures, numbness, tingling, tics, unsteady gait, slurred speech, confusion. Denies muscle or joint pain, stiffness, or dystonia.  Individual Medical History/ Review of Systems: Changes? :No    Past medications for mental health diagnoses include: Ingrezza Remeron, Ambien, and others (unknown)  Allergies: Ambien [zolpidem tartrate], Hydrocodone, Nsaids, Propoxyphene n-acetaminophen, Tramadol, Varenicline tartrate, Chantix [varenicline], and Other  Current Medications:  Current Outpatient Medications:  .  albuterol (PROAIR HFA) 108 (90 Base) MCG/ACT inhaler, INHALE 2 PUFFS INTO THE LUNGS EVERY 6 HOURS AS NEEDED FOR WHEEZING ORSHORTNESS OF BREATH., Disp: 8.5 g, Rfl: 11 .  aspirin 325 MG tablet, Take 325 mg by mouth daily., Disp: , Rfl:  .  atorvastatin (LIPITOR) 40 MG tablet, Take 1 tablet (40 mg total) by mouth daily., Disp: 90 tablet, Rfl: 1 .  Cholecalciferol (VITAMIN D3) 1000 UNITS CAPS, Take 1,000 Units by mouth daily. , Disp: , Rfl:  .  cyanocobalamin 1000 MCG tablet, Take 1,000 mcg by mouth daily. , Disp: , Rfl:  .  cyclobenzaprine (FLEXERIL) 5 MG tablet,  Take 1 tablet (5 mg total) by mouth 3 (three) times daily as needed for muscle spasms., Disp: 30 tablet, Rfl: 2 .  diclofenac (VOLTAREN) 75 MG EC tablet, Take 75 mg by mouth 2 (two) times daily. , Disp: , Rfl:  .  famotidine (PEPCID) 20 MG tablet, TAKE 1 TABLET BY MOUTH  DAILY,  Disp: 90 tablet, Rfl: 1 .  lamoTRIgine (LAMICTAL) 100 MG tablet, 1 in am and 3 hs, Disp: 360 tablet, Rfl: 0 .  OLANZapine (ZYPREXA) 10 MG tablet, Take 3 tablets (30 mg total) by mouth at bedtime., Disp: 270 tablet, Rfl: 3 .  Omega-3 Fatty Acids (FISH OIL) 1000 MG CAPS, Take 1 capsule by mouth daily., Disp: , Rfl:  .  Oxycodone HCl 20 MG TABS, Take 1 tablet by mouth every 6 (six) hours as needed (pain). , Disp: , Rfl:  .  potassium chloride (K-DUR) 10 MEQ tablet, Take 1 tablet (10 mEq total) by mouth daily., Disp: 90 tablet, Rfl: 1 .  promethazine (PHENERGAN) 12.5 MG tablet, Take 1 tablet by mouth  every 6 hours as needed for nausea, Disp: 90 tablet, Rfl: 0 .  risperidone (RISPERDAL) 4 MG tablet, TAKE 1 TABLET BY MOUTH IN  THE MORNING AND 2 TABLETS  AT BEDTIME, Disp: 270 tablet, Rfl: 0 .  triamcinolone (NASACORT AQ) 55 MCG/ACT AERO nasal inhaler, Place 2 sprays into the nose daily., Disp: 1 Inhaler, Rfl: 12 .  triamcinolone cream (KENALOG) 0.1 %, Apply 1 application topically 2 (two) times daily., Disp: 30 g, Rfl: 0 .  amoxicillin (AMOXIL) 500 MG capsule, Take 2 capsules (1,000 mg total) by mouth 2 (two) times daily. (Patient not taking: Reported on 01/01/2019), Disp: 28 capsule, Rfl: 0 .  cetirizine (ZYRTEC) 10 MG tablet, Take 1 tablet (10 mg total) by mouth daily. (Patient not taking: Reported on 05/06/2018), Disp: 90 tablet, Rfl: 3 .  pantoprazole (PROTONIX) 40 MG tablet, Take 1 tablet (40 mg total) by mouth 2 (two) times daily before a meal., Disp: 180 tablet, Rfl: 3 Medication Side Effects: tremor  Family Medical/ Social History: Changes? No  MENTAL HEALTH EXAM:  There were no vitals taken for this visit.There is no height or weight on file to calculate BMI.  General Appearance: Unable to assess  Eye Contact:  Unable to assess  Speech:  Clear and Coherent  Volume:  Normal  Mood:  Euthymic  Affect:  Unable to assess  Thought Process:  Goal Directed  Orientation:  Full (Time, Place, and  Person)  Thought Content: Logical   Suicidal Thoughts:  No  Homicidal Thoughts:  No  Memory:  WNL  Judgement:  Good  Insight:  Good  Psychomotor Activity:  Unable to assess  Concentration:  Concentration: Good  Recall:  Good  Fund of Knowledge: Good  Language: Good  Assets:  Desire for Improvement  ADL's:  Intact  Cognition: WNL  Prognosis:  Good    DIAGNOSES:    ICD-10-CM   1. Schizoaffective disorder, unspecified type (HCC)  F25.9   2. Anxiety state  F41.1   3. Tremor due to multiple drugs  G25.1     Receiving Psychotherapy: No    RECOMMENDATIONS:  Increase Cogentin 1 mg to 2 p.o. 3 times daily. Continue Lamictal 100 mg, 1 every morning and 3 nightly. Continue Zyprexa 30 mg nightly. Continue Risperdal 4 mg 1 every morning and 2 nightly. We discussed options for the tremor.  I am increasing the Cogentin but if that does not help I am  going to discuss further with her at the next visit whether we need to decrease the dose of the Zyprexa and see if that helps with the tremor.  She may be a candidate for Austedo but since Ingrezza was ineffective I am unsure whether that would.   Return in 4 weeks.  Donnal Moat, PA-C   This record has been created using [employer].  Chart creation errors have been sought, but may not always have been located and corrected. Such creation errors do not reflect on the standard of medical care.

## 2019-01-15 ENCOUNTER — Telehealth: Payer: Self-pay | Admitting: Physician Assistant

## 2019-01-15 NOTE — Telephone Encounter (Signed)
ient called and said that you told her about a medicine that would help her with her tremors. You said it was $ 12  cash price. She does not remember the name of the medicine. [facility] [place]

## 2019-01-18 ENCOUNTER — Other Ambulatory Visit: Payer: Self-pay

## 2019-01-18 MED ORDER — TRIHEXYPHENIDYL HCL 5 MG PO TABS: 5.0000 mg | ORAL_TABLET | Freq: Two times a day (BID) | ORAL | 1 refills | Status: DC

## 2019-01-18 NOTE — Telephone Encounter (Signed)
Discussed with patient and [facility] [place], [place] submitted. Instructed to call back with any questions or concerns

## 2019-01-18 NOTE — Telephone Encounter (Signed)
We had discussed Artane I believe.  Have her stop the Cogentin and switch to this: Artane 5 mg #60 1 po bid.  It's $9 at [facility], $12 at [facility] and [facility], and goes up from there, per [facility].

## 2019-01-19 ENCOUNTER — Other Ambulatory Visit: Payer: Self-pay | Admitting: Physician Assistant

## 2019-02-03 DIAGNOSIS — M791 Myalgia, unspecified site: Secondary | ICD-10-CM | POA: Diagnosis not present

## 2019-02-03 DIAGNOSIS — M4726 Other spondylosis with radiculopathy, lumbar region: Secondary | ICD-10-CM | POA: Diagnosis not present

## 2019-02-03 DIAGNOSIS — M961 Postlaminectomy syndrome, not elsewhere classified: Secondary | ICD-10-CM | POA: Diagnosis not present

## 2019-02-03 DIAGNOSIS — M542 Cervicalgia: Secondary | ICD-10-CM | POA: Diagnosis not present

## 2019-02-03 DIAGNOSIS — M5136 Other intervertebral disc degeneration, lumbar region: Secondary | ICD-10-CM | POA: Diagnosis not present

## 2019-02-03 DIAGNOSIS — Z79891 Long term (current) use of opiate analgesic: Secondary | ICD-10-CM | POA: Diagnosis not present

## 2019-02-09 ENCOUNTER — Ambulatory Visit: Payer: Medicare Other | Admitting: Physician Assistant

## 2019-02-17 ENCOUNTER — Other Ambulatory Visit: Payer: Self-pay

## 2019-02-17 MED ORDER — OLANZAPINE 10 MG PO TABS: 30.0000 mg | ORAL_TABLET | Freq: Every day | ORAL | 3 refills | Status: DC

## 2019-02-18 ENCOUNTER — Telehealth: Payer: Self-pay | Admitting: Physician Assistant

## 2019-02-18 NOTE — Telephone Encounter (Signed)
Patient called and said that the medicine  Artane 5mg  you prescribed for tremors is not working at all. She would like to try something else.. She has an appointment at the end of the month. [facility] [place]

## 2019-02-19 NOTE — Telephone Encounter (Signed)
Have her increase it to 5mg  tid for another month. We'll disc at [place].

## 2019-02-19 NOTE — Telephone Encounter (Signed)
Patient verbalized understanding  

## 2019-03-15 ENCOUNTER — Ambulatory Visit: Payer: Medicare Other | Admitting: Physician Assistant

## 2019-03-21 ENCOUNTER — Other Ambulatory Visit: Payer: Self-pay | Admitting: Physician Assistant

## 2019-03-22 NOTE — Telephone Encounter (Signed)
No follow up scheduled, last visit 12/2018 Dose increase last phone message to tid

## 2019-03-23 NOTE — Telephone Encounter (Signed)
Please schedule an appointment.  Let her know I am sending a prescription in, but no refills until she seen.

## 2019-03-31 DIAGNOSIS — Z79891 Long term (current) use of opiate analgesic: Secondary | ICD-10-CM | POA: Diagnosis not present

## 2019-03-31 DIAGNOSIS — M4726 Other spondylosis with radiculopathy, lumbar region: Secondary | ICD-10-CM | POA: Diagnosis not present

## 2019-03-31 DIAGNOSIS — M5136 Other intervertebral disc degeneration, lumbar region: Secondary | ICD-10-CM | POA: Diagnosis not present

## 2019-03-31 DIAGNOSIS — M542 Cervicalgia: Secondary | ICD-10-CM | POA: Diagnosis not present

## 2019-03-31 DIAGNOSIS — M961 Postlaminectomy syndrome, not elsewhere classified: Secondary | ICD-10-CM | POA: Diagnosis not present

## 2019-04-06 ENCOUNTER — Other Ambulatory Visit: Payer: Self-pay | Admitting: Physician Assistant

## 2019-04-06 ENCOUNTER — Other Ambulatory Visit: Payer: Self-pay | Admitting: Internal Medicine

## 2019-04-06 NOTE — Telephone Encounter (Signed)
Can you check if pt is still taking?

## 2019-04-07 DIAGNOSIS — M961 Postlaminectomy syndrome, not elsewhere classified: Secondary | ICD-10-CM | POA: Diagnosis not present

## 2019-04-07 DIAGNOSIS — M47817 Spondylosis without myelopathy or radiculopathy, lumbosacral region: Secondary | ICD-10-CM | POA: Diagnosis not present

## 2019-04-07 DIAGNOSIS — M5136 Other intervertebral disc degeneration, lumbar region: Secondary | ICD-10-CM | POA: Diagnosis not present

## 2019-04-07 DIAGNOSIS — M47816 Spondylosis without myelopathy or radiculopathy, lumbar region: Secondary | ICD-10-CM | POA: Diagnosis not present

## 2019-04-07 DIAGNOSIS — M4726 Other spondylosis with radiculopathy, lumbar region: Secondary | ICD-10-CM | POA: Diagnosis not present

## 2019-04-07 DIAGNOSIS — M5137 Other intervertebral disc degeneration, lumbosacral region: Secondary | ICD-10-CM | POA: Diagnosis not present

## 2019-04-07 NOTE — Telephone Encounter (Signed)
Spoke with pt. She does not take this longer.

## 2019-04-27 ENCOUNTER — Other Ambulatory Visit: Payer: Self-pay | Admitting: Physician Assistant

## 2019-04-27 NOTE — Telephone Encounter (Signed)
Apt tomorrow 11/10 looks like you recommend she increase dose on phone message also

## 2019-04-28 ENCOUNTER — Ambulatory Visit (INDEPENDENT_AMBULATORY_CARE_PROVIDER_SITE_OTHER): Payer: Medicare Other | Admitting: Physician Assistant

## 2019-04-28 ENCOUNTER — Other Ambulatory Visit: Payer: Self-pay

## 2019-04-28 ENCOUNTER — Encounter: Payer: Self-pay | Admitting: Physician Assistant

## 2019-04-28 DIAGNOSIS — F32A Depression, unspecified: Secondary | ICD-10-CM

## 2019-04-28 DIAGNOSIS — F259 Schizoaffective disorder, unspecified: Secondary | ICD-10-CM

## 2019-04-28 DIAGNOSIS — F329 Major depressive disorder, single episode, unspecified: Secondary | ICD-10-CM

## 2019-04-28 DIAGNOSIS — G251 Drug-induced tremor: Secondary | ICD-10-CM

## 2019-04-28 MED ORDER — TRIHEXYPHENIDYL HCL 5 MG PO TABS: ORAL_TABLET | ORAL | 0 refills | Status: DC

## 2019-04-28 MED ORDER — RISPERIDONE 4 MG PO TABS: ORAL_TABLET | ORAL | 3 refills | Status: DC

## 2019-04-28 NOTE — Progress Notes (Signed)
[facility]  Patient ID: [name],  MRN: [id number]  PCP: Biagio Borg, MD  Date of Evaluation: 04/28/2019 Time spent:15 minutes  Chief Complaint:  Chief Complaint    Depression; Medication Problem     Virtual Visit via Telephone Note  I connected with patient by a video enabled telemedicine application or telephone, with their informed consent, and verified patient privacy and that I am speaking with the correct person using two identifiers.  I am private, in my office and the patient is at home.  I discussed the limitations, risks, security and privacy concerns of performing an evaluation and management service by telephone and the availability of in person appointments. I also discussed with the patient that there may be a patient responsible charge related to this service. The patient expressed understanding and agreed to proceed.   I discussed the assessment and treatment plan with the patient. The patient was provided an opportunity to ask questions and all were answered. The patient agreed with the plan and demonstrated an understanding of the instructions.   The patient was advised to call back or seek an in-person evaluation if the symptoms worsen or if the condition fails to improve as anticipated.  I provided 15 minutes of non-face-to-face time during this encounter.  HISTORY/CURRENT STATUS: HPI for 4-month med check.  Patient continues to have the tremor in her hands bilaterally, worse in her dominant left hand.  She cannot write, cannot hold to drink without spilling it, and it even affects her driving because she has trouble controlling the steering wheel.  This is been going on for at least a year and a half.  She was on Ingrezza for about 3 months but it was not effective at all.  Plus it was very expensive so she stopped it.  She has also been on high-dose Cogentin 2 mg 3 times daily without improvement.  And most recently Artane 5 mg 3 times daily  with no improvement.  She denies any abnormal movements of the trunk, neck, tongue, clenching of her jaws, pursing of lips, jerking of her legs or feet, no abnormal movements of her toes.  She also reports not feeling like doing anything.  She cannot put a time frame to this but states that happens sometimes since her last visit in July.  She only wants to stay in her room.  Says that she does not sleep all the time, usually sleeps at night and fairly well most of the time but she wants to stay in her bedroom during the day.  She only goes out of her room to fix herself something to eat, go to the bathroom, or take care of her cats.  She goes out to the grocery store only and that is not very common.  She lives alone so it does not like she is trying to avoid other people by staying in her room.  She has no motivation to do anything.  Denies suicidal or homicidal thoughts.  Patient denies increased energy with decreased need for sleep, no increased talkativeness, no racing thoughts, no impulsivity or risky behaviors, no increased spending, no increased libido, no grandiosity.  Denies dizziness, syncope, seizures, numbness, tingling, tics, unsteady gait, slurred speech, confusion. Denies muscle or joint pain, stiffness, or dystonia.  Individual Medical History/ Review of Systems: Changes? :No    Past medications for mental health diagnoses include: Latuda, Risperdal, Zyprexa, Cymbalta, Cogentin, Lamictal, lithium, Remeron, Ambien doxazosin, Klonopin, Ativan, Ingrezza, Artane  Allergies: Ambien [  zolpidem tartrate], Hydrocodone, Nsaids, Propoxyphene n-acetaminophen, Tramadol, Varenicline tartrate, Chantix [varenicline], and Other  Current Medications:  Current Outpatient Medications:  .  albuterol (PROAIR HFA) 108 (90 Base) MCG/ACT inhaler, INHALE 2 PUFFS INTO THE LUNGS EVERY 6 HOURS AS NEEDED FOR WHEEZING ORSHORTNESS OF BREATH., Disp: 8.5 g, Rfl: 11 .  aspirin 325 MG tablet, Take 325 mg by mouth  daily., Disp: , Rfl:  .  atorvastatin (LIPITOR) 40 MG tablet, TAKE 1 TABLET BY MOUTH  DAILY, Disp: 90 tablet, Rfl: 2 .  Cholecalciferol (VITAMIN D3) 1000 UNITS CAPS, Take 1,000 Units by mouth daily. , Disp: , Rfl:  .  cyanocobalamin 1000 MCG tablet, Take 1,000 mcg by mouth daily. , Disp: , Rfl:  .  diclofenac (VOLTAREN) 75 MG EC tablet, Take 75 mg by mouth 2 (two) times daily. , Disp: , Rfl:  .  famotidine (PEPCID) 20 MG tablet, TAKE 1 TABLET BY MOUTH  DAILY, Disp: 90 tablet, Rfl: 1 .  lamoTRIgine (LAMICTAL) 100 MG tablet, TAKE 1 TABLET BY MOUTH IN  THE MORNING AND 3 TABLETS  BY MOUTH AT BEDTIME, Disp: 360 tablet, Rfl: 3 .  OLANZapine (ZYPREXA) 10 MG tablet, Take 3 tablets (30 mg total) by mouth at bedtime., Disp: 270 tablet, Rfl: 3 .  Omega-3 Fatty Acids (FISH OIL) 1000 MG CAPS, Take 1 capsule by mouth daily., Disp: , Rfl:  .  Oxycodone HCl 20 MG TABS, Take 1 tablet by mouth every 6 (six) hours as needed (pain). , Disp: , Rfl:  .  potassium chloride (KLOR-CON) 10 MEQ tablet, TAKE 1 TABLET BY MOUTH  DAILY, Disp: 90 tablet, Rfl: 2 .  promethazine (PHENERGAN) 12.5 MG tablet, Take 1 tablet by mouth  every 6 hours as needed for nausea, Disp: 90 tablet, Rfl: 0 .  risperidone (RISPERDAL) 4 MG tablet, TAKE 1 TABLET BY MOUTH IN  THE MORNING AND 1 1/2 TABLETS  BY MOUTH AT BEDTIME, Disp: 270 tablet, Rfl: 3 .  triamcinolone (NASACORT AQ) 55 MCG/ACT AERO nasal inhaler, Place 2 sprays into the nose daily., Disp: 1 Inhaler, Rfl: 12 .  triamcinolone cream (KENALOG) 0.1 %, Apply 1 application topically 2 (two) times daily., Disp: 30 g, Rfl: 0 .  trihexyphenidyl (ARTANE) 5 MG tablet, 1 bid for 1 week, then 1 qd for 1 week, then d/c, Disp: 90 tablet, Rfl: 0 .  amoxicillin (AMOXIL) 500 MG capsule, Take 2 capsules (1,000 mg total) by mouth 2 (two) times daily. (Patient not taking: Reported on 01/01/2019), Disp: 28 capsule, Rfl: 0 .  cetirizine (ZYRTEC) 10 MG tablet, Take 1 tablet (10 mg total) by mouth daily. (Patient  not taking: Reported on 05/06/2018), Disp: 90 tablet, Rfl: 3 .  cyclobenzaprine (FLEXERIL) 5 MG tablet, Take 1 tablet (5 mg total) by mouth 3 (three) times daily as needed for muscle spasms. (Patient not taking: Reported on 04/28/2019), Disp: 30 tablet, Rfl: 2 .  pantoprazole (PROTONIX) 40 MG tablet, Take 1 tablet (40 mg total) by mouth 2 (two) times daily before a meal., Disp: 180 tablet, Rfl: 3 Medication Side Effects: none  Family Medical/ Social History: Changes? No   MENTAL HEALTH EXAM:  There were no vitals taken for this visit.There is no height or weight on file to calculate BMI.  General Appearance: unable to assess  Eye Contact:  unable to assess  Speech:  Clear and Coherent  Volume:  Normal  Mood:  Euthymic  Affect:  unable to assess  Thought Process:  Goal Directed and Descriptions of Associations:  Intact  Orientation:  Full (Time, Place, and Person)  Thought Content: Logical   Suicidal Thoughts:  No  Homicidal Thoughts:  No  Memory:  WNLsometimes forgets what she went in a room for or something like that but o/w ok  Judgement:  Good  Insight:  Good  Psychomotor Activity:  unable to assess  Concentration:  Concentration: Good  Recall:  Good  Fund of Knowledge: Good  Language: Good  Assets:  Desire for Improvement  ADL's:  Intact  Cognition: WNL  Prognosis:  Good    DIAGNOSES:    ICD-10-CM   1. Tremor due to multiple drugs  G25.1   2. Schizoaffective disorder, unspecified type ([place])  F25.9   3. Depression, unspecified depression type  F32.9     Receiving Psychotherapy: No    RECOMMENDATIONS:  Discussed case with Dr. Clovis Pu. Recommended patient to be seen in the office to evaluate tremor. Decrease Risperdal to 4 mg every morning and 6 mg nightly (she was taking 8 mg nightly) Continue Lamictal 100 mg every morning and 300 mg nightly. Continue Zyprexa 30 mg nightly. Decrease Artane 5 mg from 3 times daily to twice daily for 1 week and then 1 daily for 1 week  and then stop. Patient verbalizes understanding on all of the above. Continue multivitamin, fish oil, vitamin D, cyanocobalamin  Return within the next 4 weeks.  Donnal Moat, PA-C

## 2019-05-17 DIAGNOSIS — M961 Postlaminectomy syndrome, not elsewhere classified: Secondary | ICD-10-CM | POA: Diagnosis not present

## 2019-05-17 DIAGNOSIS — Z79891 Long term (current) use of opiate analgesic: Secondary | ICD-10-CM | POA: Diagnosis not present

## 2019-05-17 DIAGNOSIS — M542 Cervicalgia: Secondary | ICD-10-CM | POA: Diagnosis not present

## 2019-05-17 DIAGNOSIS — M4726 Other spondylosis with radiculopathy, lumbar region: Secondary | ICD-10-CM | POA: Diagnosis not present

## 2019-05-17 DIAGNOSIS — M5136 Other intervertebral disc degeneration, lumbar region: Secondary | ICD-10-CM | POA: Diagnosis not present

## 2019-06-01 ENCOUNTER — Ambulatory Visit: Payer: Medicare Other | Admitting: Internal Medicine

## 2019-06-01 DIAGNOSIS — M47816 Spondylosis without myelopathy or radiculopathy, lumbar region: Secondary | ICD-10-CM | POA: Diagnosis not present

## 2019-06-01 DIAGNOSIS — M791 Myalgia, unspecified site: Secondary | ICD-10-CM | POA: Diagnosis not present

## 2019-06-03 ENCOUNTER — Encounter: Payer: Self-pay | Admitting: Physician Assistant

## 2019-06-03 ENCOUNTER — Ambulatory Visit (INDEPENDENT_AMBULATORY_CARE_PROVIDER_SITE_OTHER): Payer: Medicare Other | Admitting: Physician Assistant

## 2019-06-03 ENCOUNTER — Other Ambulatory Visit: Payer: Self-pay

## 2019-06-03 VITALS — BP 102/58 | HR 100

## 2019-06-03 DIAGNOSIS — F259 Schizoaffective disorder, unspecified: Secondary | ICD-10-CM | POA: Diagnosis not present

## 2019-06-03 DIAGNOSIS — R251 Tremor, unspecified: Secondary | ICD-10-CM | POA: Diagnosis not present

## 2019-06-03 DIAGNOSIS — F329 Major depressive disorder, single episode, unspecified: Secondary | ICD-10-CM

## 2019-06-03 DIAGNOSIS — F17200 Nicotine dependence, unspecified, uncomplicated: Secondary | ICD-10-CM

## 2019-06-03 DIAGNOSIS — F411 Generalized anxiety disorder: Secondary | ICD-10-CM | POA: Diagnosis not present

## 2019-06-03 DIAGNOSIS — F32A Depression, unspecified: Secondary | ICD-10-CM

## 2019-06-03 MED ORDER — GABAPENTIN 300 MG PO CAPS: ORAL_CAPSULE | ORAL | 0 refills | Status: DC

## 2019-06-03 NOTE — Progress Notes (Signed)
[facility]  Patient ID: [name],  MRN: [id number]  PCP: Biagio Borg, MD  Date of Evaluation: 06/03/2019 Time spent:30 minutes  Chief Complaint:  Chief Complaint    Tremors; Depression      HISTORY/CURRENT STATUS: HPI For f/u tremor.   Having tremor for 'years.' Writes with her left hand but does everything else w/ her right hand.  Can't write much at all now. Worse tremor in left hand, but it can happen in right also.  Has trouble eating b/c can't get the fork to her mouth. Has been told by a neurologist she has essential tremor.  Tremor usually happens when she moves her hand, but it can happen when she's sitting down and not reaching for something.  Saw Dr. Trula Ore (neuro) years ago, states he didn't do giver her anything for it.  She never has any tremor in her feet, no bruxism, or biting her buccal mucosa, no abnormal tongue movements. She had left over Cogentin, started back on it about a week ago.  No improvement. Has been on Artane, Ingrezza all w/o relief.  At the last visit about 6 weeks ago, we decreased the Risperdal in hopes that it would help with the tremor.  She noticed no change in the tremor or her mental state.  States the depression is about the same.  She does not want to go anywhere and stays in her room a lot.  She does not cry easily.  She does not really enjoy anything but that is partly due to the coronavirus pandemic and the restrictions with that.  She really has nowhere to go or anything to do.  She denies suicidal or homicidal thoughts.  Reports no increased energy with decreased need for sleep.  No increased spending, libido, impulsivity or risky behaviors.  No grandiosity.  No hallucinations.  States she sometimes gets dizzy especially when she stands up too quickly.  She has had no falls.  No dizziness at rest.  No syncope.  States her blood pressure is always on the low side of normal.  She continues to smoke and is not ready to quit  at the present time.  She has cut back.  Review of Systems  Constitutional: Negative.   HENT: Negative.   Eyes: Negative.   Respiratory: Negative.   Cardiovascular: Negative.   Gastrointestinal: Negative.   Genitourinary: Negative.   Musculoskeletal: Positive for back pain.  Skin: Negative.   Neurological: Positive for dizziness and tremors.  Endo/Heme/Allergies: Negative.   Psychiatric/Behavioral: Positive for depression. Negative for hallucinations, memory loss, substance abuse and suicidal ideas. The patient is nervous/anxious. The patient does not have insomnia.      Individual Medical History/ Review of Systems: Changes? :No    Past medications for mental health diagnoses include: Latuda, Risperdal, Zyprexa, Cymbalta, Cogentin, Lamictal, lithium, Remeron, Ambien, doxazosin, Klonopin, Ativan, Ingrezza, Artane, Gabapentin  Allergies: Ambien [zolpidem tartrate], Hydrocodone, Nsaids, Propoxyphene n-acetaminophen, Tramadol, Varenicline tartrate, Chantix [varenicline], and Other  Current Medications:  Current Outpatient Medications:  .  albuterol (PROAIR HFA) 108 (90 Base) MCG/ACT inhaler, INHALE 2 PUFFS INTO THE LUNGS EVERY 6 HOURS AS NEEDED FOR WHEEZING ORSHORTNESS OF BREATH., Disp: 8.5 g, Rfl: 11 .  aspirin 325 MG tablet, Take 325 mg by mouth daily., Disp: , Rfl:  .  atorvastatin (LIPITOR) 40 MG tablet, TAKE 1 TABLET BY MOUTH  DAILY, Disp: 90 tablet, Rfl: 2 .  benztropine (COGENTIN) 0.5 MG tablet, Take 0.5 mg by mouth 2 (two) times daily.,  Disp: , Rfl:  .  Cholecalciferol (VITAMIN D3) 1000 UNITS CAPS, Take 1,000 Units by mouth daily. , Disp: , Rfl:  .  cyanocobalamin 1000 MCG tablet, Take 1,000 mcg by mouth daily. , Disp: , Rfl:  .  diclofenac (VOLTAREN) 75 MG EC tablet, Take 75 mg by mouth 2 (two) times daily. , Disp: , Rfl:  .  famotidine (PEPCID) 20 MG tablet, TAKE 1 TABLET BY MOUTH  DAILY, Disp: 90 tablet, Rfl: 1 .  lamoTRIgine (LAMICTAL) 100 MG tablet, TAKE 1 TABLET BY MOUTH  IN  THE MORNING AND 3 TABLETS  BY MOUTH AT BEDTIME, Disp: 360 tablet, Rfl: 3 .  OLANZapine (ZYPREXA) 10 MG tablet, Take 3 tablets (30 mg total) by mouth at bedtime., Disp: 270 tablet, Rfl: 3 .  Oxycodone HCl 20 MG TABS, Take 1 tablet by mouth every 6 (six) hours as needed (pain). , Disp: , Rfl:  .  potassium chloride (KLOR-CON) 10 MEQ tablet, TAKE 1 TABLET BY MOUTH  DAILY, Disp: 90 tablet, Rfl: 2 .  promethazine (PHENERGAN) 12.5 MG tablet, Take 1 tablet by mouth  every 6 hours as needed for nausea, Disp: 90 tablet, Rfl: 0 .  risperidone (RISPERDAL) 4 MG tablet, TAKE 1 TABLET BY MOUTH IN  THE MORNING AND 1 1/2 TABLETS  BY MOUTH AT BEDTIME, Disp: 270 tablet, Rfl: 3 .  triamcinolone (NASACORT AQ) 55 MCG/ACT AERO nasal inhaler, Place 2 sprays into the nose daily., Disp: 1 Inhaler, Rfl: 12 .  amoxicillin (AMOXIL) 500 MG capsule, Take 2 capsules (1,000 mg total) by mouth 2 (two) times daily. (Patient not taking: Reported on 01/01/2019), Disp: 28 capsule, Rfl: 0 .  cetirizine (ZYRTEC) 10 MG tablet, Take 1 tablet (10 mg total) by mouth daily. (Patient not taking: Reported on 05/06/2018), Disp: 90 tablet, Rfl: 3 .  cyclobenzaprine (FLEXERIL) 5 MG tablet, Take 1 tablet (5 mg total) by mouth 3 (three) times daily as needed for muscle spasms. (Patient not taking: Reported on 04/28/2019), Disp: 30 tablet, Rfl: 2 .  gabapentin (NEURONTIN) 300 MG capsule, 1 po qhs for 2 days, then 1 po bid., Disp: 180 capsule, Rfl: 0 .  Omega-3 Fatty Acids (FISH OIL) 1000 MG CAPS, Take 1 capsule by mouth daily., Disp: , Rfl:  .  pantoprazole (PROTONIX) 40 MG tablet, Take 1 tablet (40 mg total) by mouth 2 (two) times daily before a meal., Disp: 180 tablet, Rfl: 3 .  triamcinolone cream (KENALOG) 0.1 %, Apply 1 application topically 2 (two) times daily. (Patient not taking: Reported on 06/03/2019), Disp: 30 g, Rfl: 0 .  trihexyphenidyl (ARTANE) 5 MG tablet, 1 bid for 1 week, then 1 qd for 1 week, then d/c (Patient not taking: Reported  on 06/03/2019), Disp: 90 tablet, Rfl: 0 Medication Side Effects: none  Family Medical/ Social History: Changes? No  MENTAL HEALTH EXAM:  Blood pressure (!) 102/58, pulse 100.There is no height or weight on file to calculate BMI.  General Appearance: Casual, Neat and Well Groomed  Eye Contact:  Good  Speech:  Clear and Coherent  Volume:  Normal  Mood:  Euthymic  Affect:  Appropriate  Thought Process:  Goal Directed and Descriptions of Associations: Intact  Orientation:  Full (Time, Place, and Person)  Thought Content: Logical   Suicidal Thoughts:  No  Homicidal Thoughts:  No  Memory:  WNL  Judgement:  Good  Insight:  Good  Psychomotor Activity:  Tremor and Is worse when she moves her hand or arm.  Finger-to-nose is  almost impossible due to the tremor.  It is worse in the left hand but can also occur in the right hand, at rest.  No pill-rolling.  No abnormal mouth movements.  No truncal movements.  No shuffling gait.  Concentration:  Concentration: Good  Recall:  Good  Fund of Knowledge: Good  Language: Good  Assets:  Desire for Improvement  ADL's:  Intact  Cognition: WNL  Prognosis:  Good    DIAGNOSES:    ICD-10-CM   1. Tremor of both hands  R25.1   2. Schizoaffective disorder, unspecified type ([place])  F25.9   3. Anxiety state  F41.1   4. Depression, unspecified depression type  F32.9   5. Smoker  F17.200     Receiving Psychotherapy: No    RECOMMENDATIONS:  She has been diagnosed with essential tremor in the past and per my observation, I believe that is the cause of her tremor at present.  It does not appear to be tardive dyskinesia or a psych med related tremor.  I recommend she see her neurologist again for further work-up if needed. We discussed possibly starting propranolol but because her blood pressure is already on the low side, that is not appropriate.  Other options are gabapentin and possibly Lyrica.  After discussing the benefits, side effects, and risk, she  would like to try the gabapentin.  She understands is off label for this reason but after further discussion, she does report some restlessness of her legs so hopefully will help that issue as well. Start gabapentin 300 mg 1 nightly for 2 days and then 1 p.o. twice daily. Continue Cogentin 0.5 mg twice daily.  We may be able to DC that at the next visit because it is doubtful that it is helping anyway.  I do not want to change too many things at once so I will leave it alone for now. Continue Lamictal 100 mg, 1 p.o. every morning and 3 p.o. nightly. Continue Zyprexa 10 mg, 3 p.o. nightly. Continue Risperdal 4 mg, 1 p.o. every morning and 1.5 pills at bedtime. Continue multivitamin, fish oil, recommend B complex, and vitamin D. Return in 6 weeks.  Donnal Moat, PA-C

## 2019-06-08 ENCOUNTER — Ambulatory Visit: Payer: Medicare Other | Admitting: Internal Medicine

## 2019-06-29 ENCOUNTER — Ambulatory Visit: Payer: Medicare Other | Admitting: Internal Medicine

## 2019-07-01 DIAGNOSIS — Z1231 Encounter for screening mammogram for malignant neoplasm of breast: Secondary | ICD-10-CM | POA: Diagnosis not present

## 2019-07-02 ENCOUNTER — Telehealth: Payer: Self-pay | Admitting: Physician Assistant

## 2019-07-02 NOTE — Telephone Encounter (Signed)
Increase to tid.  Let me know if I need to send in new Rx

## 2019-07-02 NOTE — Telephone Encounter (Signed)
Given instructions to increase to tid Gabapentin 300 mg, she verbalized understanding and has plenty at home. Instructed to call back if symptoms not improving.

## 2019-07-02 NOTE — Telephone Encounter (Signed)
Pt called to report Gabapentin is not lasting all day. Asking to increase. Please advise your input. [phone]

## 2019-07-05 ENCOUNTER — Ambulatory Visit: Payer: Medicare Other | Admitting: Internal Medicine

## 2019-07-15 ENCOUNTER — Other Ambulatory Visit: Payer: Self-pay | Admitting: Internal Medicine

## 2019-07-15 ENCOUNTER — Encounter: Payer: Self-pay | Admitting: Internal Medicine

## 2019-07-15 ENCOUNTER — Ambulatory Visit (INDEPENDENT_AMBULATORY_CARE_PROVIDER_SITE_OTHER): Payer: Medicare Other | Admitting: Internal Medicine

## 2019-07-15 ENCOUNTER — Other Ambulatory Visit: Payer: Self-pay

## 2019-07-15 ENCOUNTER — Ambulatory Visit: Payer: Medicare Other | Admitting: Physician Assistant

## 2019-07-15 VITALS — BP 116/68 | HR 99 | Temp 98.0°F | Ht 65.0 in | Wt 147.4 lb

## 2019-07-15 DIAGNOSIS — Z0000 Encounter for general adult medical examination without abnormal findings: Secondary | ICD-10-CM | POA: Diagnosis not present

## 2019-07-15 DIAGNOSIS — E538 Deficiency of other specified B group vitamins: Secondary | ICD-10-CM

## 2019-07-15 DIAGNOSIS — E559 Vitamin D deficiency, unspecified: Secondary | ICD-10-CM | POA: Diagnosis not present

## 2019-07-15 DIAGNOSIS — R739 Hyperglycemia, unspecified: Secondary | ICD-10-CM | POA: Diagnosis not present

## 2019-07-15 LAB — BASIC METABOLIC PANEL
BUN: 7 mg/dL (ref 6–23)
CO2: 28 mEq/L (ref 19–32)
Calcium: 9.4 mg/dL (ref 8.4–10.5)
Chloride: 102 mEq/L (ref 96–112)
Creatinine, Ser: 0.86 mg/dL (ref 0.40–1.20)
GFR: 67.01 mL/min (ref >60.00)
Glucose, Bld: 107 mg/dL — ABNORMAL HIGH (ref 70–99)
Potassium: 4.1 mEq/L (ref 3.5–5.1)
Sodium: 138 mEq/L (ref 135–145)

## 2019-07-15 LAB — HEPATIC FUNCTION PANEL
ALT: 11 U/L (ref 0–35)
AST: 13 U/L (ref 0–37)
Albumin: 3.7 g/dL (ref 3.5–5.2)
Alkaline Phosphatase: 62 U/L (ref 39–117)
Bilirubin, Direct: 0.1 mg/dL (ref 0.0–0.3)
Total Bilirubin: 0.4 mg/dL (ref 0.2–1.2)
Total Protein: 5.8 g/dL — ABNORMAL LOW (ref 6.0–8.3)

## 2019-07-15 LAB — CBC WITH DIFFERENTIAL/PLATELET
Basophils Absolute: 0.1 10*3/uL (ref 0.0–0.1)
Basophils Relative: 0.6 % (ref 0.0–3.0)
Eosinophils Absolute: 0 10*3/uL (ref 0.0–0.7)
Eosinophils Relative: 0.2 % (ref 0.0–5.0)
HCT: 44.9 % (ref 36.0–46.0)
Hemoglobin: 14.8 g/dL (ref 12.0–15.0)
Lymphocytes Relative: 8.8 % — ABNORMAL LOW (ref 12.0–46.0)
Lymphs Abs: 1 10*3/uL (ref 0.7–4.0)
MCHC: 33 g/dL (ref 30.0–36.0)
MCV: 98.7 fl (ref 78.0–100.0)
Monocytes Absolute: 0.7 10*3/uL (ref 0.1–1.0)
Monocytes Relative: 6 % (ref 3.0–12.0)
Neutro Abs: 9.7 10*3/uL — ABNORMAL HIGH (ref 1.4–7.7)
Neutrophils Relative %: 84.4 % — ABNORMAL HIGH (ref 43.0–77.0)
Platelets: 220 10*3/uL (ref 150.0–400.0)
RBC: 4.55 Mil/uL (ref 3.87–5.11)
RDW: 15 % (ref 11.5–15.5)
WBC: 11.5 10*3/uL — ABNORMAL HIGH (ref 4.0–10.5)

## 2019-07-15 LAB — LIPID PANEL
Cholesterol: 112 mg/dL (ref 0–200)
HDL: 49.1 mg/dL (ref >39.00)
LDL Cholesterol: 52 mg/dL (ref 0–99)
NonHDL: 62.66
Total CHOL/HDL Ratio: 2
Triglycerides: 55 mg/dL (ref 0.0–149.0)
VLDL: 11 mg/dL (ref 0.0–40.0)

## 2019-07-15 LAB — HEMOGLOBIN A1C: Hgb A1c MFr Bld: 5.3 % (ref 4.6–6.5)

## 2019-07-15 LAB — TSH: TSH: 1.35 u[IU]/mL (ref 0.35–4.50)

## 2019-07-15 LAB — VITAMIN D 25 HYDROXY (VIT D DEFICIENCY, FRACTURES): VITD: 17.76 ng/mL — ABNORMAL LOW (ref 30.00–100.00)

## 2019-07-15 LAB — VITAMIN B12: Vitamin B-12: 273 pg/mL (ref 211–911)

## 2019-07-15 MED ORDER — VITAMIN D (ERGOCALCIFEROL) 1.25 MG (50000 UNIT) PO CAPS: 50000.0000 [IU] | ORAL_CAPSULE | ORAL | 0 refills | Status: DC

## 2019-07-15 NOTE — Assessment & Plan Note (Signed)

## 2019-07-15 NOTE — Progress Notes (Signed)
Subjective:    Patient ID: [name], female    DOB: 03-10-1956, 64 y.o.   MRN: [id number]  HPI   Here for wellness and f/u;  Overall doing ok;  Pt denies Chest pain, worsening SOB, DOE, wheezing, orthopnea, PND, worsening LE edema, palpitations, dizziness or syncope.  Pt denies neurological change such as new headache, facial or extremity weakness.  Pt denies polydipsia, polyuria, or low sugar symptoms. Pt states overall good compliance with treatment and medications, good tolerability, and has been trying to follow appropriate diet.  Pt denies worsening depressive symptoms, suicidal ideation or panic. No fever, night sweats, wt loss, loss of appetite, or other constitutional symptoms.  Pt states good ability with ADL's, has low fall risk, home safety reviewed and adequate, no other significant changes in hearing or vision, and only occasionally active with exercise. No new complaints Past Medical History:  Diagnosis Date  . Allergic rhinitis   . Arthritis   . Brain aneurysm   . Chronic abdominal pain   . COPD (chronic obstructive pulmonary disease) ([place])    denies  . Depression    bipolar  . Fibromyalgia   . Gastroparesis   . GERD (gastroesophageal reflux disease)   . H/O hiatal hernia   . History of colonic polyps   . HLD (hyperlipidemia)   . IBS (irritable bowel syndrome)    chronic constipation  . Migraines   . OCD (obsessive compulsive disorder)    anxiety  . Peripheral neuropathy    Past Surgical History:  Procedure Laterality Date  . ABDOMINAL HYSTERECTOMY    . APPENDECTOMY    . CERVICAL DISC SURGERY    . cholecystectomy    . coil to brain aneurysm    . COLONOSCOPY    . ELBOW SURGERY Bilateral    tendonitis  . IR ANGIO INTRA EXTRACRAN SEL COM CAROTID INNOMINATE BILAT MOD SED  07/31/2017  . IR ANGIO VERTEBRAL SEL VERTEBRAL BILAT MOD SED  07/31/2017  . NECK SURGERY    . OOPHORECTOMY    . RADIOLOGY WITH ANESTHESIA N/A 05/10/2013   Procedure: RADIOLOGY WITH  ANESTHESIA;  Surgeon: Rob Hickman, MD;  Location: [facility];  Service: Radiology;  Laterality: N/A;  . SHOULDER SURGERY Left   . TUBAL LIGATION    . VESICOVAGINAL FISTULA CLOSURE W/ TAH      reports that she has been smoking cigarettes. She has a 13.00 pack-year smoking history. She has never used smokeless tobacco. She reports that she does not drink alcohol or use drugs. family history includes Cancer in her father, mother, sister, and another family member; Coronary artery disease in an other family member; Diabetes in an other family member; Hypertension in an other family member; Leukemia in her sister; Seizures in an other family member. Allergies  Allergen Reactions  . Ambien [Zolpidem Tartrate] Other (See Comments)    Memory issues  . Hydrocodone Nausea Only    Can take it if its in a cough syrup   . Nsaids     REACTION: GI irritation  . Propoxyphene N-Acetaminophen Itching  . Tramadol     REACTION: nausea  . Varenicline Tartrate     REACTION: agitation, depression, sick to stomach  . Chantix [Varenicline] Other (See Comments)    "Makes bipolar worse"  . Other Other (See Comments)    Lactose intolerant   Current Outpatient Medications on File Prior to Visit  Medication Sig Dispense Refill  . albuterol (PROAIR HFA) 108 (90 Base) MCG/ACT inhaler  INHALE 2 PUFFS INTO THE LUNGS EVERY 6 HOURS AS NEEDED FOR WHEEZING ORSHORTNESS OF BREATH. 8.5 g 11  . aspirin 325 MG tablet Take 325 mg by mouth daily.    Marland Kitchen atorvastatin (LIPITOR) 40 MG tablet TAKE 1 TABLET BY MOUTH  DAILY 90 tablet 2  . benztropine (COGENTIN) 0.5 MG tablet Take 0.5 mg by mouth 2 (two) times daily.    . Cholecalciferol (VITAMIN D3) 1000 UNITS CAPS Take 1,000 Units by mouth daily.     . cyanocobalamin 1000 MCG tablet Take 1,000 mcg by mouth daily.     . diclofenac (VOLTAREN) 75 MG EC tablet Take 75 mg by mouth 2 (two) times daily.     . famotidine (PEPCID) 20 MG tablet TAKE 1 TABLET BY MOUTH  DAILY 90 tablet 1  .  gabapentin (NEURONTIN) 300 MG capsule 1 po qhs for 2 days, then 1 po bid. 180 capsule 0  . lamoTRIgine (LAMICTAL) 100 MG tablet TAKE 1 TABLET BY MOUTH IN  THE MORNING AND 3 TABLETS  BY MOUTH AT BEDTIME 360 tablet 3  . OLANZapine (ZYPREXA) 10 MG tablet Take 3 tablets (30 mg total) by mouth at bedtime. 270 tablet 3  . Omega-3 Fatty Acids (FISH OIL) 1000 MG CAPS Take 1 capsule by mouth daily.    . Oxycodone HCl 20 MG TABS Take 1 tablet by mouth every 6 (six) hours as needed (pain).     . potassium chloride (KLOR-CON) 10 MEQ tablet TAKE 1 TABLET BY MOUTH  DAILY 90 tablet 2  . promethazine (PHENERGAN) 12.5 MG tablet Take 1 tablet by mouth  every 6 hours as needed for nausea 90 tablet 0  . risperidone (RISPERDAL) 4 MG tablet TAKE 1 TABLET BY MOUTH IN  THE MORNING AND 1 1/2 TABLETS  BY MOUTH AT BEDTIME 270 tablet 3  . triamcinolone (NASACORT AQ) 55 MCG/ACT AERO nasal inhaler Place 2 sprays into the nose daily. 1 Inhaler 12  . amoxicillin (AMOXIL) 500 MG capsule Take 2 capsules (1,000 mg total) by mouth 2 (two) times daily. (Patient not taking: Reported on 01/01/2019) 28 capsule 0  . cetirizine (ZYRTEC) 10 MG tablet Take 1 tablet (10 mg total) by mouth daily. (Patient not taking: Reported on 05/06/2018) 90 tablet 3  . cyclobenzaprine (FLEXERIL) 5 MG tablet Take 1 tablet (5 mg total) by mouth 3 (three) times daily as needed for muscle spasms. (Patient not taking: Reported on 04/28/2019) 30 tablet 2  . pantoprazole (PROTONIX) 40 MG tablet Take 1 tablet (40 mg total) by mouth 2 (two) times daily before a meal. 180 tablet 3  . triamcinolone cream (KENALOG) 0.1 % Apply 1 application topically 2 (two) times daily. (Patient not taking: Reported on 06/03/2019) 30 g 0   No current facility-administered medications on file prior to visit.   Review of Systems All otherwise neg per pt     Objective:   Physical Exam BP 116/68 (BP Location: Left Arm, Patient Position: Sitting, Cuff Size: Normal)   Pulse 99   Temp  98 F (36.7 C) (Oral)   Ht 5\' 5"  (1.651 m)   Wt 147 lb 6.4 oz (66.9 kg)   SpO2 94%   BMI 24.53 kg/m  VS noted,  Constitutional: Pt appears in NAD HENT: Head: NCAT.  Right Ear: External ear normal.  Left Ear: External ear normal.  Eyes: . Pupils are equal, round, and reactive to light. Conjunctivae and EOM are normal Nose: without d/c or deformity Neck: Neck supple. Gross normal ROM  Cardiovascular: Normal rate and regular rhythm.   Pulmonary/Chest: Effort normal and breath sounds without rales or wheezing.  Abd:  Soft, NT, ND, + BS, no organomegaly Neurological: Pt is alert. At baseline orientation, motor grossly intact Skin: Skin is warm. No rashes, other new lesions, no LE edema Psychiatric: Pt behavior is normal without agitation  All otherwise neg per pt Lab Results  Component Value Date   WBC 6.5 11/23/2018   HGB 13.8 11/23/2018   HCT 40.8 11/23/2018   PLT 251.0 11/23/2018   GLUCOSE 102 (H) 11/23/2018   CHOL 113 11/23/2018   TRIG 58.0 11/23/2018   HDL 48.60 11/23/2018   LDLDIRECT 102.7 08/21/2011   LDLCALC 53 11/23/2018   ALT 10 11/23/2018   AST 9 11/23/2018   NA 137 11/23/2018   K 4.5 11/23/2018   CL 103 11/23/2018   CREATININE 0.97 11/23/2018   BUN 7 11/23/2018   CO2 27 11/23/2018   TSH 2.42 11/23/2018   INR 0.88 07/31/2017   HGBA1C 5.5 11/23/2018          Assessment & Plan:

## 2019-07-15 NOTE — Patient Instructions (Signed)

## 2019-07-15 NOTE — Assessment & Plan Note (Signed)
For lab

## 2019-07-15 NOTE — Assessment & Plan Note (Signed)
stable overall by history and exam, recent data reviewed with pt, and pt to continue medical treatment as before,  to f/u any worsening symptoms or concerns  

## 2019-07-16 LAB — URINALYSIS, ROUTINE W REFLEX MICROSCOPIC
Bilirubin Urine: NEGATIVE
Hgb urine dipstick: NEGATIVE
Ketones, ur: NEGATIVE
Leukocytes,Ua: NEGATIVE
Nitrite: NEGATIVE
Specific Gravity, Urine: 1.01 (ref 1.000–1.030)
Total Protein, Urine: NEGATIVE
Urine Glucose: NEGATIVE
Urobilinogen, UA: 0.2 (ref 0.0–1.0)
pH: 5.5 (ref 5.0–8.0)

## 2019-07-21 DIAGNOSIS — Z79891 Long term (current) use of opiate analgesic: Secondary | ICD-10-CM | POA: Diagnosis not present

## 2019-07-21 DIAGNOSIS — M4726 Other spondylosis with radiculopathy, lumbar region: Secondary | ICD-10-CM | POA: Diagnosis not present

## 2019-07-21 DIAGNOSIS — M542 Cervicalgia: Secondary | ICD-10-CM | POA: Diagnosis not present

## 2019-07-21 DIAGNOSIS — M5136 Other intervertebral disc degeneration, lumbar region: Secondary | ICD-10-CM | POA: Diagnosis not present

## 2019-07-21 DIAGNOSIS — M961 Postlaminectomy syndrome, not elsewhere classified: Secondary | ICD-10-CM | POA: Diagnosis not present

## 2019-08-03 ENCOUNTER — Encounter: Payer: Self-pay | Admitting: Physician Assistant

## 2019-08-03 ENCOUNTER — Ambulatory Visit (INDEPENDENT_AMBULATORY_CARE_PROVIDER_SITE_OTHER): Payer: Medicare Other | Admitting: Physician Assistant

## 2019-08-03 DIAGNOSIS — F259 Schizoaffective disorder, unspecified: Secondary | ICD-10-CM

## 2019-08-03 DIAGNOSIS — F329 Major depressive disorder, single episode, unspecified: Secondary | ICD-10-CM

## 2019-08-03 DIAGNOSIS — R251 Tremor, unspecified: Secondary | ICD-10-CM

## 2019-08-03 DIAGNOSIS — F411 Generalized anxiety disorder: Secondary | ICD-10-CM | POA: Diagnosis not present

## 2019-08-03 DIAGNOSIS — F17200 Nicotine dependence, unspecified, uncomplicated: Secondary | ICD-10-CM

## 2019-08-03 DIAGNOSIS — F32A Depression, unspecified: Secondary | ICD-10-CM

## 2019-08-03 MED ORDER — GABAPENTIN 800 MG PO TABS: ORAL_TABLET | ORAL | 0 refills | Status: DC

## 2019-08-03 NOTE — Progress Notes (Signed)
[facility]  Patient ID: HENSLEE ALLTON,  MRN: [id number]  PCP: Biagio Borg, MD  Date of Evaluation: 08/03/2019 Time spent:30 minutes  Chief Complaint:  Chief Complaint    Follow-up     Virtual Visit via Telephone Note  I connected with patient by a video enabled telemedicine application or telephone, with their informed consent, and verified patient privacy and that I am speaking with the correct person using two identifiers.  I am private, in my office and the patient is home.   I discussed the limitations, risks, security and privacy concerns of performing an evaluation and management service by telephone and the availability of in person appointments. I also discussed with the patient that there may be a patient responsible charge related to this service. The patient expressed understanding and agreed to proceed.   I discussed the assessment and treatment plan with the patient. The patient was provided an opportunity to ask questions and all were answered. The patient agreed with the plan and demonstrated an understanding of the instructions.   The patient was advised to call back or seek an in-person evaluation if the symptoms worsen or if the condition fails to improve as anticipated.  I provided 30 minutes of non-face-to-face time during this encounter.  HISTORY/CURRENT STATUS: HPI For routine med check.  At the last visit 6 weeks ago, we added gabapentin for what seems to be essential tremor.  We increased the dose slightly 1 month ago.  She states the tremor is about 25 to 50% better.  She is tolerating the medication just fine.  No side effects.  Restlessness in her legs that she reported has improved but not completely gone away.  "I think we are on the right track.  I am glad we found something that seems to be working."  Feels good as far as her mood goes. Patient denies loss of interest in usual activities and is able to enjoy things.  Denies decreased  energy or motivation.  Appetite has not changed.  No extreme sadness, tearfulness, or feelings of hopelessness.  Denies any changes in concentration, making decisions or remembering things.  Denies suicidal or homicidal thoughts.  Patient denies increased energy with decreased need for sleep, no increased talkativeness, no racing thoughts, no impulsivity or risky behaviors, no increased spending, no increased libido, no grandiosity.  No hallucinations.  She continues to smoke.  Not ready to quit at this time.  Denies dizziness, syncope, seizures, numbness, tingling, tics, unsteady gait, slurred speech, confusion. Denies muscle or joint pain, stiffness, or dystonia.  Individual Medical History/ Review of Systems: Changes? :No   Allergies: Ambien [zolpidem tartrate], Hydrocodone, Nsaids, Propoxyphene n-acetaminophen, Tramadol, Varenicline tartrate, Chantix [varenicline], and Other  Current Medications:  Current Outpatient Medications:  .  albuterol (PROAIR HFA) 108 (90 Base) MCG/ACT inhaler, INHALE 2 PUFFS INTO THE LUNGS EVERY 6 HOURS AS NEEDED FOR WHEEZING ORSHORTNESS OF BREATH., Disp: 8.5 g, Rfl: 11 .  aspirin 325 MG tablet, Take 325 mg by mouth daily., Disp: , Rfl:  .  atorvastatin (LIPITOR) 40 MG tablet, TAKE 1 TABLET BY MOUTH  DAILY, Disp: 90 tablet, Rfl: 2 .  Cholecalciferol (VITAMIN D3) 1000 UNITS CAPS, Take 1,000 Units by mouth daily. , Disp: , Rfl:  .  cyanocobalamin 1000 MCG tablet, Take 1,000 mcg by mouth daily. , Disp: , Rfl:  .  famotidine (PEPCID) 20 MG tablet, TAKE 1 TABLET BY MOUTH  DAILY, Disp: 90 tablet, Rfl: 1 .  lamoTRIgine (LAMICTAL) 100  MG tablet, TAKE 1 TABLET BY MOUTH IN  THE MORNING AND 3 TABLETS  BY MOUTH AT BEDTIME, Disp: 360 tablet, Rfl: 3 .  OLANZapine (ZYPREXA) 10 MG tablet, Take 3 tablets (30 mg total) by mouth at bedtime., Disp: 270 tablet, Rfl: 3 .  Oxycodone HCl 20 MG TABS, Take 1 tablet by mouth every 6 (six) hours as needed (pain). , Disp: , Rfl:  .  potassium  chloride (KLOR-CON) 10 MEQ tablet, TAKE 1 TABLET BY MOUTH  DAILY, Disp: 90 tablet, Rfl: 2 .  promethazine (PHENERGAN) 12.5 MG tablet, Take 1 tablet by mouth  every 6 hours as needed for nausea, Disp: 90 tablet, Rfl: 0 .  risperidone (RISPERDAL) 4 MG tablet, TAKE 1 TABLET BY MOUTH IN  THE MORNING AND 1 1/2 TABLETS  BY MOUTH AT BEDTIME, Disp: 270 tablet, Rfl: 3 .  Vitamin D, Ergocalciferol, (DRISDOL) 1.25 MG (50000 UNIT) CAPS capsule, Take 1 capsule (50,000 Units total) by mouth every 7 (seven) days., Disp: 12 capsule, Rfl: 0 .  amoxicillin (AMOXIL) 500 MG capsule, Take 2 capsules (1,000 mg total) by mouth 2 (two) times daily. (Patient not taking: Reported on 01/01/2019), Disp: 28 capsule, Rfl: 0 .  cetirizine (ZYRTEC) 10 MG tablet, Take 1 tablet (10 mg total) by mouth daily. (Patient not taking: Reported on 05/06/2018), Disp: 90 tablet, Rfl: 3 .  cyclobenzaprine (FLEXERIL) 5 MG tablet, Take 1 tablet (5 mg total) by mouth 3 (three) times daily as needed for muscle spasms. (Patient not taking: Reported on 04/28/2019), Disp: 30 tablet, Rfl: 2 .  diclofenac (VOLTAREN) 75 MG EC tablet, Take 75 mg by mouth 2 (two) times daily. , Disp: , Rfl:  .  gabapentin (NEURONTIN) 800 MG tablet, 1 po bid for 1 week, then 1 po tid., Disp: 270 tablet, Rfl: 0 .  Omega-3 Fatty Acids (FISH OIL) 1000 MG CAPS, Take 1 capsule by mouth daily., Disp: , Rfl:  .  pantoprazole (PROTONIX) 40 MG tablet, Take 1 tablet (40 mg total) by mouth 2 (two) times daily before a meal., Disp: 180 tablet, Rfl: 3 .  triamcinolone (NASACORT AQ) 55 MCG/ACT AERO nasal inhaler, Place 2 sprays into the nose daily. (Patient not taking: Reported on 08/03/2019), Disp: 1 Inhaler, Rfl: 12 .  triamcinolone cream (KENALOG) 0.1 %, Apply 1 application topically 2 (two) times daily. (Patient not taking: Reported on 06/03/2019), Disp: 30 g, Rfl: 0 Medication Side Effects: Tremor  Family Medical/ Social History: Changes? No  MENTAL HEALTH EXAM:  There were no  vitals taken for this visit.There is no height or weight on file to calculate BMI.  General Appearance: Able to assess  Eye Contact:  Able to assess  Speech:  Clear and Coherent  Volume:  Normal  Mood:  Euthymic  Affect:  Unable to assess  Thought Process:  Goal Directed and Descriptions of Associations: Intact  Orientation:  Full (Time, Place, and Person)  Thought Content: Logical   Suicidal Thoughts:  No  Homicidal Thoughts:  No  Memory:  WNL  Judgement:  Good  Insight:  Good  Psychomotor Activity:  Able to assess  Concentration:  Concentration: Good  Recall:  Good  Fund of Knowledge: Good  Language: Good  Assets:  Desire for Improvement  ADL's:  Intact  Cognition: WNL  Prognosis:  Good    DIAGNOSES:    ICD-10-CM   1. Tremor of both hands  R25.1   2. Schizoaffective disorder, unspecified type ([place])  F25.9   3. Generalized anxiety disorder  F41.1   4. Depression, unspecified depression type  F32.9   5. Smoker  F17.200     Receiving Psychotherapy: No    RECOMMENDATIONS:  PDMP was reviewed. I spent 30 minutes with her. We discussed smoking cessation.  She is not ready to quit now but will let me know if she needs help in the future. We also discussed of the tremor.  I am glad that she is somewhat better.  She changed the way she was taking the gabapentin to a total of 900 mg in the morning.  But it seems to wear off and the tremor recurs in the mid afternoon.  I will increase the dose for that reason. Increase gabapentin 300 mg, 3 p.o. every morning and 2 p.o. nightly for now.  Then start as follows once the prescription from [place] arrives: Increase gabapentin to 800 mg 1 p.o. twice daily for 1 week and then increase to 1 3 times daily. Continue Lamictal 100 mg, 1 p.o. every morning and 3 nightly. Continue Zyprexa 10 mg, 3 p.o. nightly. Continue Risperdal 4 mg, 1 p.o. every morning and 1.5 pills nightly. Continue multivitamin, fish oil, vitamin D, and B12. Return in 6  to 8 weeks.   Donnal Moat, PA-C

## 2019-09-13 DIAGNOSIS — Z79891 Long term (current) use of opiate analgesic: Secondary | ICD-10-CM | POA: Diagnosis not present

## 2019-09-13 DIAGNOSIS — M4726 Other spondylosis with radiculopathy, lumbar region: Secondary | ICD-10-CM | POA: Diagnosis not present

## 2019-09-13 DIAGNOSIS — M542 Cervicalgia: Secondary | ICD-10-CM | POA: Diagnosis not present

## 2019-09-13 DIAGNOSIS — M791 Myalgia, unspecified site: Secondary | ICD-10-CM | POA: Diagnosis not present

## 2019-09-13 DIAGNOSIS — M961 Postlaminectomy syndrome, not elsewhere classified: Secondary | ICD-10-CM | POA: Diagnosis not present

## 2019-09-13 DIAGNOSIS — M5136 Other intervertebral disc degeneration, lumbar region: Secondary | ICD-10-CM | POA: Diagnosis not present

## 2019-09-29 ENCOUNTER — Encounter: Payer: Self-pay | Admitting: Physician Assistant

## 2019-09-29 ENCOUNTER — Other Ambulatory Visit: Payer: Self-pay

## 2019-09-29 ENCOUNTER — Ambulatory Visit (INDEPENDENT_AMBULATORY_CARE_PROVIDER_SITE_OTHER): Payer: Medicare Other | Admitting: Physician Assistant

## 2019-09-29 DIAGNOSIS — F411 Generalized anxiety disorder: Secondary | ICD-10-CM

## 2019-09-29 DIAGNOSIS — G2401 Drug induced subacute dyskinesia: Secondary | ICD-10-CM

## 2019-09-29 DIAGNOSIS — R251 Tremor, unspecified: Secondary | ICD-10-CM | POA: Diagnosis not present

## 2019-09-29 DIAGNOSIS — F329 Major depressive disorder, single episode, unspecified: Secondary | ICD-10-CM | POA: Diagnosis not present

## 2019-09-29 DIAGNOSIS — F259 Schizoaffective disorder, unspecified: Secondary | ICD-10-CM | POA: Diagnosis not present

## 2019-09-29 DIAGNOSIS — F17200 Nicotine dependence, unspecified, uncomplicated: Secondary | ICD-10-CM

## 2019-09-29 DIAGNOSIS — F32A Depression, unspecified: Secondary | ICD-10-CM

## 2019-09-29 MED ORDER — OLANZAPINE 10 MG PO TABS: 20.0000 mg | ORAL_TABLET | Freq: Every day | ORAL | 0 refills | Status: DC

## 2019-09-29 MED ORDER — BUPROPION HCL ER (XL) 150 MG PO TB24: 150.0000 mg | ORAL_TABLET | Freq: Every morning | ORAL | 1 refills | Status: DC

## 2019-09-29 NOTE — Progress Notes (Signed)
[facility]  Patient ID: [name],  MRN: [id number]  PCP: Biagio Borg, MD  Date of Evaluation: 09/29/2019 Time spent:30 minutes  Chief Complaint:  Chief Complaint    Follow-up      HISTORY/CURRENT STATUS: HPI For routine med check.  Her friend [name] is with her today.  At [facility], we increased Gabapentin in hopes to help the tremor.  She was diagnosed with essential tremor many years ago.  States she might be a little better.  States the tremor is hit or miss anyway so she is not sure how much the gabapentin is really helping.  We had discussed at the last visit that we would refer her to neurology if she is not better at this visit.  Couple of years ago she took [place] for tardive dyskinesia.  She states it did not help at all.  Patient states that she is really depressed.  She does not really want to do anything.  She only wants to stay at home.  She would like for people to come see her or call her but she does not like to go places.  [name] says that she is embarrassed to go out because of the tremor.  [name] states she would just rather be at home.  She puts off things like cleaning the cats litter box.  It should be done daily but she cleans every other day and even then, she waits till the last minute to do it.  She also procrastinates on taking a shower.  She feels good once she gets in the shower but it is hard for her to make herself do it.  Her energy and motivation are low.  She does like to sit out on the patio of her apartment now that the weather is nicer.  She does not cry easily.  She sleeps too much, or I should say stays in bed too much.  She does sleep at night but it is "broken up."  Denies suicidal or homicidal thoughts.  Patient denies increased energy with decreased need for sleep, no increased talkativeness, no racing thoughts, no impulsivity or risky behaviors, no increased spending, no increased libido, no grandiosity, no increased irritability or  anger, and no hallucinations.  Denies paranoia.  She and her friend asked whether she is on too much medication.  She has a history of schizoaffective disorder that has required antipsychotics and mood stabilizers because 1 or the other does not treat all of her symptoms.  Denies dizziness, syncope, seizures, numbness, tingling, tics, unsteady gait, slurred speech, confusion. Denies muscle or joint pain, stiffness, or dystonia.  Individual Medical History/ Review of Systems: Changes? :No    Past medications for mental health diagnoses include: Ingrezza, Lamictal, Risperdal, Zyprexa, Cogentin, Latuda, Cymbalta, lithium, Ambien, prazosin, Ativan, Wellbutrin, Zoloft, and probably others  Allergies: Ambien [zolpidem tartrate], Hydrocodone, Nsaids, Propoxyphene n-acetaminophen, Tramadol, Varenicline tartrate, Chantix [varenicline], and Other  Current Medications:  Current Outpatient Medications:  .  albuterol (PROAIR HFA) 108 (90 Base) MCG/ACT inhaler, INHALE 2 PUFFS INTO THE LUNGS EVERY 6 HOURS AS NEEDED FOR WHEEZING ORSHORTNESS OF BREATH., Disp: 8.5 g, Rfl: 11 .  aspirin 325 MG tablet, Take 325 mg by mouth daily., Disp: , Rfl:  .  atorvastatin (LIPITOR) 40 MG tablet, TAKE 1 TABLET BY MOUTH  DAILY, Disp: 90 tablet, Rfl: 2 .  Cholecalciferol (VITAMIN D3) 1000 UNITS CAPS, Take 1,000 Units by mouth daily. , Disp: , Rfl:  .  cyanocobalamin 1000 MCG tablet, Take  1,000 mcg by mouth daily. , Disp: , Rfl:  .  diclofenac (VOLTAREN) 75 MG EC tablet, Take 75 mg by mouth 2 (two) times daily. , Disp: , Rfl:  .  famotidine (PEPCID) 20 MG tablet, TAKE 1 TABLET BY MOUTH  DAILY, Disp: 90 tablet, Rfl: 1 .  fexofenadine (ALLEGRA) 180 MG tablet, Take 180 mg by mouth daily., Disp: , Rfl:  .  gabapentin (NEURONTIN) 800 MG tablet, 1 po bid for 1 week, then 1 po tid., Disp: 270 tablet, Rfl: 0 .  lamoTRIgine (LAMICTAL) 100 MG tablet, TAKE 1 TABLET BY MOUTH IN  THE MORNING AND 3 TABLETS  BY MOUTH AT BEDTIME, Disp: 360  tablet, Rfl: 3 .  OLANZapine (ZYPREXA) 10 MG tablet, Take 2 tablets (20 mg total) by mouth at bedtime., Disp: 180 tablet, Rfl: 0 .  Oxycodone HCl 20 MG TABS, Take 1 tablet by mouth every 6 (six) hours as needed (pain). , Disp: , Rfl:  .  potassium chloride (KLOR-CON) 10 MEQ tablet, TAKE 1 TABLET BY MOUTH  DAILY, Disp: 90 tablet, Rfl: 2 .  promethazine (PHENERGAN) 12.5 MG tablet, Take 1 tablet by mouth  every 6 hours as needed for nausea, Disp: 90 tablet, Rfl: 0 .  risperidone (RISPERDAL) 4 MG tablet, TAKE 1 TABLET BY MOUTH IN  THE MORNING AND 1 1/2 TABLETS  BY MOUTH AT BEDTIME, Disp: 270 tablet, Rfl: 3 .  Vitamin D, Ergocalciferol, (DRISDOL) 1.25 MG (50000 UNIT) CAPS capsule, Take 1 capsule (50,000 Units total) by mouth every 7 (seven) days., Disp: 12 capsule, Rfl: 0 .  amoxicillin (AMOXIL) 500 MG capsule, Take 2 capsules (1,000 mg total) by mouth 2 (two) times daily. (Patient not taking: Reported on 01/01/2019), Disp: 28 capsule, Rfl: 0 .  buPROPion (WELLBUTRIN XL) 150 MG 24 hr tablet, Take 1 tablet (150 mg total) by mouth in the morning., Disp: 30 tablet, Rfl: 1 .  cetirizine (ZYRTEC) 10 MG tablet, Take 1 tablet (10 mg total) by mouth daily. (Patient not taking: Reported on 05/06/2018), Disp: 90 tablet, Rfl: 3 .  cyclobenzaprine (FLEXERIL) 5 MG tablet, Take 1 tablet (5 mg total) by mouth 3 (three) times daily as needed for muscle spasms. (Patient not taking: Reported on 04/28/2019), Disp: 30 tablet, Rfl: 2 .  Omega-3 Fatty Acids (FISH OIL) 1000 MG CAPS, Take 1 capsule by mouth daily., Disp: , Rfl:  .  pantoprazole (PROTONIX) 40 MG tablet, Take 1 tablet (40 mg total) by mouth 2 (two) times daily before a meal., Disp: 180 tablet, Rfl: 3 .  triamcinolone (NASACORT AQ) 55 MCG/ACT AERO nasal inhaler, Place 2 sprays into the nose daily. (Patient not taking: Reported on 08/03/2019), Disp: 1 Inhaler, Rfl: 12 .  triamcinolone cream (KENALOG) 0.1 %, Apply 1 application topically 2 (two) times daily. (Patient not  taking: Reported on 06/03/2019), Disp: 30 g, Rfl: 0 Medication Side Effects: Possibly tremor secondary to Risperdal, Zyprexa, and or Lamictal  Family Medical/ Social History: Changes? No  MENTAL HEALTH EXAM:  There were no vitals taken for this visit.There is no height or weight on file to calculate BMI.  General Appearance: Casual, Neat and Well Groomed  Eye Contact:  Good  Speech:  Clear and Coherent and Normal Rate  Volume:  Normal  Mood:  Depressed  Affect:  Depressed  Thought Process:  Goal Directed and Descriptions of Associations: Intact  Orientation:  Full (Time, Place, and Person)  Thought Content: Logical   Suicidal Thoughts:  No  Homicidal Thoughts:  No  Memory:  Recent;   Fair  Judgement:  Good  Insight:  Good  Psychomotor Activity:  TD, Tremor and Has tremor, worse in her right hand, with motion that comes and goes.  She also purses her lips often.  Concentration:  Concentration: Good  Recall:  Good  Fund of Knowledge: Good  Language: Good  Assets:  Desire for Improvement  ADL's:  Intact  Cognition: WNL  Prognosis:  Good    DIAGNOSES:    ICD-10-CM   1. Schizoaffective disorder, unspecified type ([place])  F25.9   2. Depression, unspecified depression type  F32.9   3. Generalized anxiety disorder  F41.1   4. Tremor of both hands  R25.1   5. Smoker  F17.200   6. Tardive dyskinesia  G24.01     Receiving Psychotherapy: No    RECOMMENDATIONS:  PDMP reviewed. I spent 30 minutes with her and in reviewing past medication treatment successes and failures. Refer to Neurology for tremor. I have reviewed her paper chart concerning her medications.  She has records in our office dating back to 2012 and she has been tried on many different drugs.  See above. After reviewing, I do not see a reason that we cannot decrease the dose of the Zyprexa.  But with her current symptoms of depression, I do not want to change too much.  Plus I see that she is not on an  antidepressant, which may be helpful here.  We discussed Wellbutrin, which she has taken 20+ years ago, that it can help depression as well as give her more motivation and hopefully, and decrease cravings for cigarettes.  She would like to try it.  She and [name] will both watch for symptoms of mania and if they occur, call immediately. Decrease Zyprexa from 30 mg to 20 mg nightly. Start Wellbutrin XL, 1 p.o. every morning.  We discussed the benefits, risks, side effects and she accepts.  Continue gabapentin 800 mg, 1 p.o. 3 times daily. Continue Lamictal 100 mg, 1 p.o. every morning and 3 p.o. nightly. Continue Risperdal 4 mg, 1 p.o. every morning and 1.5 pills nightly. Return in 4 weeks. Donnal Moat, PA-C

## 2019-10-04 ENCOUNTER — Other Ambulatory Visit: Payer: Self-pay | Admitting: Physician Assistant

## 2019-10-04 ENCOUNTER — Encounter: Payer: Self-pay | Admitting: Internal Medicine

## 2019-10-13 ENCOUNTER — Telehealth: Payer: Self-pay | Admitting: Physician Assistant

## 2019-10-13 NOTE — Telephone Encounter (Signed)
okay

## 2019-10-13 NOTE — Telephone Encounter (Signed)
Faxed referral to [facility] today. Attached last two office notes with reason for referral. Notified pt and told her to check back in a week if she hadn't received a call. This note is complete.

## 2019-10-26 ENCOUNTER — Other Ambulatory Visit: Payer: Self-pay

## 2019-10-26 ENCOUNTER — Encounter: Payer: Self-pay | Admitting: Physician Assistant

## 2019-10-26 ENCOUNTER — Ambulatory Visit (INDEPENDENT_AMBULATORY_CARE_PROVIDER_SITE_OTHER): Payer: Medicare Other | Admitting: Physician Assistant

## 2019-10-26 DIAGNOSIS — F329 Major depressive disorder, single episode, unspecified: Secondary | ICD-10-CM | POA: Diagnosis not present

## 2019-10-26 DIAGNOSIS — R251 Tremor, unspecified: Secondary | ICD-10-CM

## 2019-10-26 DIAGNOSIS — F411 Generalized anxiety disorder: Secondary | ICD-10-CM | POA: Diagnosis not present

## 2019-10-26 DIAGNOSIS — F259 Schizoaffective disorder, unspecified: Secondary | ICD-10-CM

## 2019-10-26 DIAGNOSIS — F32A Depression, unspecified: Secondary | ICD-10-CM

## 2019-10-26 DIAGNOSIS — F17200 Nicotine dependence, unspecified, uncomplicated: Secondary | ICD-10-CM

## 2019-10-26 MED ORDER — BUPROPION HCL ER (XL) 150 MG PO TB24: 150.0000 mg | ORAL_TABLET | Freq: Every morning | ORAL | 1 refills | Status: DC

## 2019-10-26 NOTE — Progress Notes (Signed)
[facility]  Patient ID: [name],  MRN: [id number]  PCP: Biagio Borg, MD  Date of Evaluation: 10/26/2019 Time spent:20 minutes  Chief Complaint:  Chief Complaint    Follow-up      HISTORY/CURRENT STATUS: HPI For routine med check.  Her friend [name] is with her today.  Jakeia states she is feeling so much better!  She wants to do things now and is looking forward to going to [place] to see her son graduate in a few weeks.  And she and her friend [name] are going shopping in the next few days and she is excited about that to.  "I want to go places now.  I do not feel like a hermit or people do not have to pick me to go with them somewhere."  She is not crying easily.  Energy and motivation are better.  Denies suicidal or homicidal thoughts.  She has not decreased smoking.  We were hoping that the Wellbutrin might curb her desire for that but so far it has not.  She still has the tremor.  It has not changed at all even with the decrease of Zyprexa last visit.  I hoped it would at least a little bit.  She has an appointment with neurologist on June 15 to discuss the tremor, known essential tremor and probable tardive dyskinesia that did not respond to [employer].  Patient denies increased energy with decreased need for sleep, no increased talkativeness, no racing thoughts, no impulsivity or risky behaviors, no increased spending, no increased libido, no grandiosity, no increased irritability or anger, and no hallucinations.  Denies paranoia.  Denies dizziness, syncope, seizures, numbness, tingling, tics, unsteady gait, slurred speech, confusion. Denies muscle or joint pain, stiffness, or dystonia.  Individual Medical History/ Review of Systems: Changes? :No    Past medications for mental health diagnoses include: Ingrezza was ineffective, Lamictal, Risperdal, Zyprexa, Cogentin, Latuda, Cymbalta, lithium, Ambien, prazosin, Ativan, Wellbutrin, Zoloft, and probably  others  Allergies: Ambien [zolpidem tartrate], Hydrocodone, Nsaids, Propoxyphene n-acetaminophen, Tramadol, Varenicline tartrate, Chantix [varenicline], and Other  Current Medications:  Current Outpatient Medications:  .  aspirin 325 MG tablet, Take 325 mg by mouth daily., Disp: , Rfl:  .  atorvastatin (LIPITOR) 40 MG tablet, TAKE 1 TABLET BY MOUTH  DAILY, Disp: 90 tablet, Rfl: 2 .  buPROPion (WELLBUTRIN XL) 150 MG 24 hr tablet, Take 1 tablet (150 mg total) by mouth in the morning., Disp: 90 tablet, Rfl: 1 .  Cholecalciferol (VITAMIN D3) 1000 UNITS CAPS, Take 1,000 Units by mouth daily. , Disp: , Rfl:  .  diclofenac (VOLTAREN) 75 MG EC tablet, Take 75 mg by mouth 2 (two) times daily. , Disp: , Rfl:  .  fexofenadine (ALLEGRA) 180 MG tablet, Take 180 mg by mouth daily., Disp: , Rfl:  .  gabapentin (NEURONTIN) 800 MG tablet, TAKE 1 TABLET 3 TIMES  DAILY THEREAFTER, Disp: 270 tablet, Rfl: 3 .  lamoTRIgine (LAMICTAL) 100 MG tablet, TAKE 1 TABLET BY MOUTH IN  THE MORNING AND 3 TABLETS  BY MOUTH AT BEDTIME, Disp: 360 tablet, Rfl: 3 .  OLANZapine (ZYPREXA) 10 MG tablet, Take 2 tablets (20 mg total) by mouth at bedtime., Disp: 180 tablet, Rfl: 0 .  omeprazole (PRILOSEC) 40 MG capsule, Take 40 mg by mouth daily., Disp: , Rfl:  .  Oxycodone HCl 20 MG TABS, Take 1 tablet by mouth every 6 (six) hours as needed (pain). , Disp: , Rfl:  .  potassium chloride (KLOR-CON) 10  MEQ tablet, TAKE 1 TABLET BY MOUTH  DAILY, Disp: 90 tablet, Rfl: 2 .  promethazine (PHENERGAN) 12.5 MG tablet, Take 1 tablet by mouth  every 6 hours as needed for nausea, Disp: 90 tablet, Rfl: 0 .  risperidone (RISPERDAL) 4 MG tablet, TAKE 1 TABLET BY MOUTH IN  THE MORNING AND 1 1/2 TABLETS  BY MOUTH AT BEDTIME, Disp: 270 tablet, Rfl: 3 .  albuterol (PROAIR HFA) 108 (90 Base) MCG/ACT inhaler, INHALE 2 PUFFS INTO THE LUNGS EVERY 6 HOURS AS NEEDED FOR WHEEZING ORSHORTNESS OF BREATH. (Patient not taking: Reported on 10/26/2019), Disp: 8.5 g, Rfl:  11 .  amoxicillin (AMOXIL) 500 MG capsule, Take 2 capsules (1,000 mg total) by mouth 2 (two) times daily. (Patient not taking: Reported on 01/01/2019), Disp: 28 capsule, Rfl: 0 .  cetirizine (ZYRTEC) 10 MG tablet, Take 1 tablet (10 mg total) by mouth daily. (Patient not taking: Reported on 05/06/2018), Disp: 90 tablet, Rfl: 3 .  cyanocobalamin 1000 MCG tablet, Take 1,000 mcg by mouth daily. , Disp: , Rfl:  .  cyclobenzaprine (FLEXERIL) 5 MG tablet, Take 1 tablet (5 mg total) by mouth 3 (three) times daily as needed for muscle spasms. (Patient not taking: Reported on 04/28/2019), Disp: 30 tablet, Rfl: 2 .  famotidine (PEPCID) 20 MG tablet, TAKE 1 TABLET BY MOUTH  DAILY (Patient not taking: Reported on 10/26/2019), Disp: 90 tablet, Rfl: 1 .  Omega-3 Fatty Acids (FISH OIL) 1000 MG CAPS, Take 1 capsule by mouth daily., Disp: , Rfl:  .  pantoprazole (PROTONIX) 40 MG tablet, Take 1 tablet (40 mg total) by mouth 2 (two) times daily before a meal., Disp: 180 tablet, Rfl: 3 .  triamcinolone (NASACORT AQ) 55 MCG/ACT AERO nasal inhaler, Place 2 sprays into the nose daily. (Patient not taking: Reported on 08/03/2019), Disp: 1 Inhaler, Rfl: 12 .  triamcinolone cream (KENALOG) 0.1 %, Apply 1 application topically 2 (two) times daily. (Patient not taking: Reported on 06/03/2019), Disp: 30 g, Rfl: 0 .  Vitamin D, Ergocalciferol, (DRISDOL) 1.25 MG (50000 UNIT) CAPS capsule, Take 1 capsule (50,000 Units total) by mouth every 7 (seven) days. (Patient not taking: Reported on 10/26/2019), Disp: 12 capsule, Rfl: 0 Medication Side Effects: Possibly tremor secondary to Risperdal, Zyprexa, and or Lamictal  Family Medical/ Social History: Changes? No  MENTAL HEALTH EXAM:  There were no vitals taken for this visit.There is no height or weight on file to calculate BMI.  General Appearance: Casual, Neat and Well Groomed  Eye Contact:  Good  Speech:  Clear and Coherent and Normal Rate  Volume:  Normal  Mood:  Depressed   Affect:  Depressed  Thought Process:  Goal Directed and Descriptions of Associations: Intact  Orientation:  Full (Time, Place, and Person)  Thought Content: Logical   Suicidal Thoughts:  No  Homicidal Thoughts:  No  Memory:  Recent;   Fair  Judgement:  Good  Insight:  Good  Psychomotor Activity:  No obvious signs of tardive dyskinesia.  I do not see a tremor at all today but she is holding both hands together over her wallet.   Concentration:  Concentration: Good  Recall:  Good  Fund of Knowledge: Good  Language: Good  Assets:  Desire for Improvement  ADL's:  Intact  Cognition: WNL  Prognosis:  Good    DIAGNOSES:    ICD-10-CM   1. Schizoaffective disorder, unspecified type ([place])  F25.9   2. Depression, unspecified depression type  F32.9   3. Generalized anxiety  disorder  F41.1   4. Tremor of both hands  R25.1   5. Smoker  F17.200     Receiving Psychotherapy: No    RECOMMENDATIONS:  PDMP reviewed. I spent 20 minutes with her. I am glad to see her doing so well! I am glad she has an appointment with neurology next month. Continue Zyprexa 20 mg nightly. Continue Wellbutrin XL 150 mg p.o. every morning. Continue gabapentin 800 mg, 1 p.o. 3 times daily. Continue Lamictal 100 mg, 1 p.o. every morning and 3 p.o. nightly. Continue Risperdal 4 mg, 1 p.o. every morning and 1.5 pills nightly. Return in 3 months.  Donnal Moat, PA-C

## 2019-11-10 DIAGNOSIS — M4726 Other spondylosis with radiculopathy, lumbar region: Secondary | ICD-10-CM | POA: Diagnosis not present

## 2019-11-10 DIAGNOSIS — Z79891 Long term (current) use of opiate analgesic: Secondary | ICD-10-CM | POA: Diagnosis not present

## 2019-11-10 DIAGNOSIS — M542 Cervicalgia: Secondary | ICD-10-CM | POA: Diagnosis not present

## 2019-11-10 DIAGNOSIS — M5136 Other intervertebral disc degeneration, lumbar region: Secondary | ICD-10-CM | POA: Diagnosis not present

## 2019-11-10 DIAGNOSIS — M961 Postlaminectomy syndrome, not elsewhere classified: Secondary | ICD-10-CM | POA: Diagnosis not present

## 2019-11-30 ENCOUNTER — Ambulatory Visit: Payer: Self-pay | Admitting: Neurology

## 2019-12-01 ENCOUNTER — Telehealth (HOSPITAL_COMMUNITY): Payer: Self-pay

## 2019-12-01 NOTE — Telephone Encounter (Signed)
Called to schedule angiogram, no answer, left vm. AW 

## 2019-12-02 ENCOUNTER — Other Ambulatory Visit (HOSPITAL_COMMUNITY): Payer: Self-pay | Admitting: Interventional Radiology

## 2019-12-02 DIAGNOSIS — I729 Aneurysm of unspecified site: Secondary | ICD-10-CM

## 2019-12-07 ENCOUNTER — Encounter: Payer: Self-pay | Admitting: Internal Medicine

## 2019-12-07 ENCOUNTER — Ambulatory Visit (INDEPENDENT_AMBULATORY_CARE_PROVIDER_SITE_OTHER): Payer: Medicare Other

## 2019-12-07 ENCOUNTER — Other Ambulatory Visit: Payer: Self-pay

## 2019-12-07 ENCOUNTER — Ambulatory Visit (INDEPENDENT_AMBULATORY_CARE_PROVIDER_SITE_OTHER): Payer: Medicare Other | Admitting: Internal Medicine

## 2019-12-07 VITALS — BP 142/92 | HR 94 | Temp 97.9°F | Ht 65.0 in | Wt 145.0 lb

## 2019-12-07 DIAGNOSIS — M25552 Pain in left hip: Secondary | ICD-10-CM | POA: Diagnosis not present

## 2019-12-07 DIAGNOSIS — J438 Other emphysema: Secondary | ICD-10-CM | POA: Diagnosis not present

## 2019-12-07 DIAGNOSIS — R739 Hyperglycemia, unspecified: Secondary | ICD-10-CM

## 2019-12-07 DIAGNOSIS — E785 Hyperlipidemia, unspecified: Secondary | ICD-10-CM | POA: Diagnosis not present

## 2019-12-07 DIAGNOSIS — E559 Vitamin D deficiency, unspecified: Secondary | ICD-10-CM | POA: Diagnosis not present

## 2019-12-07 DIAGNOSIS — M1612 Unilateral primary osteoarthritis, left hip: Secondary | ICD-10-CM | POA: Diagnosis not present

## 2019-12-07 NOTE — Assessment & Plan Note (Signed)
stable overall by history and exam, recent data reviewed with pt, and pt to continue medical treatment as before,  to f/u any worsening symptoms or concerns  

## 2019-12-07 NOTE — Assessment & Plan Note (Addendum)
Moderate, likely bursitis it seems, for film and f/u sport medicine  I spent 31 minutes in preparing to see the patient by review of recent labs, imaging and procedures, obtaining and reviewing separately obtained history, communicating with the patient and family or caregiver, ordering medications, tests or procedures, and documenting clinical information in the EHR including the differential Dx, treatment, and any further evaluation and other management of left hip pain, hypergylcemia, copd, hld, vit d def

## 2019-12-07 NOTE — Progress Notes (Signed)
Subjective:    Patient ID: [name], female    DOB: Nov 17, 1955, 64 y.o.   MRN: [id number]  HPI  Here to f/u with friend for support and drives her; overall doing ok,  Pt denies chest pain, increasing sob or doe, wheezing, orthopnea, PND, increased LE swelling, palpitations, dizziness or syncope.  Pt denies new neurological symptoms such as new headache, or facial or extremity weakness or numbness.  Pt denies polydipsia, polyuria,  Having colonoscopy Feb 01 2020.  Does have 1 mo gradually worsening left hip lateral pain and tender and soreness now unable to sleep on the left side, and hurts to walk, better to sit still.  No falls or trauma Past Medical History:  Diagnosis Date  . Allergic rhinitis   . Arthritis   . Brain aneurysm   . Chronic abdominal pain   . COPD (chronic obstructive pulmonary disease) ([place])    denies  . Depression    bipolar  . Fibromyalgia   . Gastroparesis   . GERD (gastroesophageal reflux disease)   . H/O hiatal hernia   . History of colonic polyps   . HLD (hyperlipidemia)   . IBS (irritable bowel syndrome)    chronic constipation  . Migraines   . OCD (obsessive compulsive disorder)    anxiety  . Peripheral neuropathy    Past Surgical History:  Procedure Laterality Date  . ABDOMINAL HYSTERECTOMY    . APPENDECTOMY    . CERVICAL DISC SURGERY    . cholecystectomy    . coil to brain aneurysm    . COLONOSCOPY    . ELBOW SURGERY Bilateral    tendonitis  . IR ANGIO INTRA EXTRACRAN SEL COM CAROTID INNOMINATE BILAT MOD SED  07/31/2017  . IR ANGIO VERTEBRAL SEL VERTEBRAL BILAT MOD SED  07/31/2017  . NECK SURGERY    . OOPHORECTOMY    . RADIOLOGY WITH ANESTHESIA N/A 05/10/2013   Procedure: RADIOLOGY WITH ANESTHESIA;  Surgeon: Rob Hickman, MD;  Location: [facility];  Service: Radiology;  Laterality: N/A;  . SHOULDER SURGERY Left   . TUBAL LIGATION    . VESICOVAGINAL FISTULA CLOSURE W/ TAH      reports that she has been smoking cigarettes. She has a  32.50 pack-year smoking history. She has never used smokeless tobacco. She reports that she does not drink alcohol and does not use drugs. family history includes Cancer in her father, mother, sister, and another family member; Coronary artery disease in an other family member; Diabetes in an other family member; Hypertension in an other family member; Leukemia in her sister; Seizures in an other family member. Allergies  Allergen Reactions  . Ambien [Zolpidem Tartrate] Other (See Comments)    Memory issues  . Hydrocodone Nausea Only    Can take it if its in a cough syrup   . Nsaids     REACTION: GI irritation  . Propoxyphene N-Acetaminophen Itching  . Tramadol     REACTION: nausea  . Varenicline Tartrate     REACTION: agitation, depression, sick to stomach  . Chantix [Varenicline] Other (See Comments)    "Makes bipolar worse"  . Other Other (See Comments)    Lactose intolerant   Current Outpatient Medications on File Prior to Visit  Medication Sig Dispense Refill  . aspirin 325 MG tablet Take 325 mg by mouth daily.    Marland Kitchen atorvastatin (LIPITOR) 40 MG tablet TAKE 1 TABLET BY MOUTH  DAILY 90 tablet 2  . buPROPion (WELLBUTRIN XL) 150  MG 24 hr tablet Take 1 tablet (150 mg total) by mouth in the morning. 90 tablet 1  . Cholecalciferol (VITAMIN D3) 1000 UNITS CAPS Take 1,000 Units by mouth daily.     . cyanocobalamin 1000 MCG tablet Take 1,000 mcg by mouth daily.     . diclofenac (VOLTAREN) 75 MG EC tablet Take 75 mg by mouth 2 (two) times daily.     . fexofenadine (ALLEGRA) 180 MG tablet Take 180 mg by mouth daily.    Marland Kitchen gabapentin (NEURONTIN) 800 MG tablet TAKE 1 TABLET 3 TIMES  DAILY THEREAFTER 270 tablet 3  . lamoTRIgine (LAMICTAL) 100 MG tablet TAKE 1 TABLET BY MOUTH IN  THE MORNING AND 3 TABLETS  BY MOUTH AT BEDTIME 360 tablet 3  . meloxicam (MOBIC) 15 MG tablet 1 tablet    . naloxone (NARCAN) 4 MG/0.1ML LIQD nasal spray kit See admin instructions.    Marland Kitchen OLANZapine (ZYPREXA) 10 MG  tablet Take 2 tablets (20 mg total) by mouth at bedtime. 180 tablet 0  . Omega-3 Fatty Acids (FISH OIL) 1000 MG CAPS Take 1 capsule by mouth daily.    Marland Kitchen omeprazole (PRILOSEC) 40 MG capsule Take 40 mg by mouth daily.    . Oxycodone HCl 20 MG TABS Take 1 tablet by mouth every 6 (six) hours as needed (pain).     . pantoprazole (PROTONIX) 40 MG tablet Take 1 tablet (40 mg total) by mouth 2 (two) times daily before a meal. 180 tablet 3  . potassium chloride (KLOR-CON) 10 MEQ tablet TAKE 1 TABLET BY MOUTH  DAILY 90 tablet 2  . promethazine (PHENERGAN) 12.5 MG tablet Take 1 tablet by mouth  every 6 hours as needed for nausea 90 tablet 0  . risperiDONE (RISPERDAL) 0.25 MG tablet 2 tablets    . risperidone (RISPERDAL) 4 MG tablet TAKE 1 TABLET BY MOUTH IN  THE MORNING AND 1 1/2 TABLETS  BY MOUTH AT BEDTIME 270 tablet 3   No current facility-administered medications on file prior to visit.   Review of Systems All otherwise neg per pt      Objective:   Physical Exam BP (!) 142/92 (BP Location: Left Arm, Patient Position: Sitting, Cuff Size: Large)   Pulse 94   Temp 97.9 F (36.6 C) (Oral)   Ht 5' 5"  (1.651 m)   Wt 145 lb (65.8 kg)   SpO2 98%   BMI 24.13 kg/m  VS noted,  Constitutional: Pt appears in NAD HENT: Head: NCAT.  Right Ear: External ear normal.  Left Ear: External ear normal.  Eyes: . Pupils are equal, round, and reactive to light. Conjunctivae and EOM are normal Nose: without d/c or deformity Neck: Neck supple. Gross normal ROM Cardiovascular: Normal rate and regular rhythm.   Pulmonary/Chest: Effort normal and breath sounds without rales or wheezing.  Abd:  Soft, NT, ND, + BS, no organomegaly Left lateral hip marked tender over the left greater trochanter Neurological: Pt is alert. At baseline orientation, motor grossly intact Skin: Skin is warm. No rashes, other new lesions, no LE edema Psychiatric: Pt behavior is normal without agitation  All otherwise neg per pt Lab  Results  Component Value Date   WBC 11.5 (H) 07/15/2019   HGB 14.8 07/15/2019   HCT 44.9 07/15/2019   PLT 220.0 07/15/2019   GLUCOSE 107 (H) 07/15/2019   CHOL 112 07/15/2019   TRIG 55.0 07/15/2019   HDL 49.10 07/15/2019   LDLDIRECT 102.7 08/21/2011   LDLCALC 52 07/15/2019  ALT 11 07/15/2019   AST 13 07/15/2019   NA 138 07/15/2019   K 4.1 07/15/2019   CL 102 07/15/2019   CREATININE 0.86 07/15/2019   BUN 7 07/15/2019   CO2 28 07/15/2019   TSH 1.35 07/15/2019   INR 0.88 07/31/2017   HGBA1C 5.3 07/15/2019        Assessment & Plan:

## 2019-12-07 NOTE — Patient Instructions (Signed)
Please continue all other medications as before, and refills have been done if requested.  Please have the pharmacy call with any other refills you may need.  Please continue your efforts at being more active, low cholesterol diet, and weight control.  You are otherwise up to date with prevention measures today.  Please keep your appointments with your specialists as you may have planned  Please go to the XRAY Department in the first floor for the x-ray testing  Please make an appt with Sports Medicine for the left hip  No lab work is needed today  Please make an Appointment to return in 6 months, or sooner if needed

## 2019-12-07 NOTE — Assessment & Plan Note (Signed)
Cont oral repalcement, for f/u lab next visit

## 2019-12-09 ENCOUNTER — Other Ambulatory Visit: Payer: Self-pay

## 2019-12-09 ENCOUNTER — Ambulatory Visit (INDEPENDENT_AMBULATORY_CARE_PROVIDER_SITE_OTHER): Payer: Medicare Other | Admitting: Family Medicine

## 2019-12-09 ENCOUNTER — Encounter: Payer: Self-pay | Admitting: Family Medicine

## 2019-12-09 ENCOUNTER — Ambulatory Visit: Payer: Self-pay

## 2019-12-09 VITALS — BP 92/62 | HR 100 | Ht 65.0 in | Wt 147.0 lb

## 2019-12-09 DIAGNOSIS — M25552 Pain in left hip: Secondary | ICD-10-CM | POA: Diagnosis not present

## 2019-12-09 NOTE — Patient Instructions (Signed)
Thank you for coming in today. Plan for PT and injection.  Ok to also use over the counter Voltaren gel on the hip up to 4x daily.  Recheck with me in about 1 month.  Let me know sooner if this is not working.   Hip Bursitis  Hip bursitis is swelling of a fluid-filled sac (bursa) in your hip joint. This swelling (inflammation) can be painful. This condition may come and go over time. What are the causes?  Injury to the hip.  Overuse of the muscles that surround the hip joint.  An earlier injury or surgery of the hip.  Arthritis or gout.  Diabetes.  Thyroid disease.  Infection.  In some cases, the cause may not be known. What are the signs or symptoms?  Mild or moderate pain in the hip area. Pain may get worse with movement.  Tenderness and swelling of the hip, especially on the outer side of the hip.  In rare cases, the bursa may become infected. This may cause: ? A fever. ? Warmth and redness in the area. Symptoms may come and go. How is this treated? This condition is treated by resting, icing, applying pressure (compression), and raising (elevating) the injured area. You may hear this called the RICE treatment. Treatment may also include:  Using crutches.  Draining fluid out of the bursa to help relieve swelling.  Giving a shot of (injecting) medicine that helps to reduce swelling (cortisone).  Other medicines if the bursa is infected. Follow these instructions at home: Managing pain, stiffness, and swelling   If told, put ice on the painful area. ? Put ice in a plastic bag. ? Place a towel between your skin and the bag. ? Leave the ice on for 20 minutes, 2-3 times a day. ? Raise (elevate) your hip above the level of your heart as much as you can without pain. To do this, try putting a pillow under your hips while you lie down. Stop if this causes pain. Activity  Return to your normal activities as told by your doctor. Ask your doctor what activities are  safe for you.  Rest and protect your hip as much as you can until you feel better. General instructions  Take over-the-counter and prescription medicines only as told by your doctor.  Wear wraps that put pressure on your hip (compression wraps) only as told by your doctor.  Do not use your hip to support your body weight until your doctor says that you can.  Use crutches as told by your doctor.  Gently rub and stretch your injured area as often as is comfortable.  Keep all follow-up visits as told by your doctor. This is important. How is this prevented?  Exercise regularly, as told by your doctor.  Warm up and stretch before being active.  Cool down and stretch after being active.  Avoid activities that bother your hip or cause pain.  Avoid sitting down for long periods at a time. Contact a doctor if:  You have a fever.  You get new symptoms.  You have trouble walking.  You have trouble doing everyday activities.  You have pain that gets worse.  You have pain that does not get better with medicine.  You get red skin on your hip area.  You get a feeling of warmth in your hip area. Get help right away if:  You cannot move your hip.  You have very bad pain. Summary  Hip bursitis is swelling of a  fluid-filled sac (bursa) in your hip.  Hip bursitis can be painful.  Symptoms often come and go over time.  This condition is treated with rest, ice, compression, elevation, and medicines. This information is not intended to replace advice given to you by your health care provider. Make sure you discuss any questions you have with your health care provider. Document Revised: 02/09/2018 Document Reviewed: 02/09/2018 Elsevier Patient Education  [place].

## 2019-12-09 NOTE — Progress Notes (Signed)
Subjective:    CC: L lateral hip pain  I, [name], LAT, ATC, am serving as scribe for Dr. [name].  HPI: Pt is a 64 y/o female presenting w/ c/o L lateral hip pain x 2-3 months that has been gradually worsening over the past few weeks.  She is ambulating w/ a standard cane.  Radiating pain:yes into her L lateral thigh Aggravating factors: walking; laying on L side; sitting Treatments tried: IBU, Oxycodone, water aerobics  Diagnostic testing: L hip XR- 12/07/19  Pertinent review of Systems: No fevers or chills  Relevant historical information: COPD, tremor   Objective:    Vitals:   12/09/19 1512  BP: 92/62  Pulse: 100  SpO2: 91%   General: Well Developed, well nourished, and in no acute distress.   MSK: Left hip normal-appearing Normal motion. Tender palpation greater trochanter. Hip abduction strength diminished 4/5.  External rotation strength diminished 4/5. Antalgic gait. Neuro: Tremor present both hands at times  Lab and Radiology Results No results found for this or any previous visit (from the past 72 hour(s)). DG HIP UNILAT WITH PELVIS 2-3 VIEWS LEFT  Result Date: 12/08/2019 CLINICAL DATA:  Pain EXAM: DG HIP (WITH OR WITHOUT PELVIS) 2-3V LEFT COMPARISON:  None. FINDINGS: Alignment is anatomic. No acute fracture. There is mild joint space narrowing. No intrinsic osseous lesion. IMPRESSION: Mild osteoarthritis. Electronically Signed   By: Macy Mis M.D.   On: 12/08/2019 15:32  I, [name], personally (independently) visualized and performed the interpretation of the images attached in this note.  Procedure: Real-time Ultrasound Guided Injection of left lateral hip greater trochanter bursa Device: Philips Affiniti 50G Images permanently stored and available for review in the ultrasound unit. Verbal informed consent obtained.  Discussed risks and benefits of procedure. Warned about infection bleeding damage to structures skin hypopigmentation  and fat atrophy among others. Patient expresses understanding and agreement Time-out conducted.   Noted no overlying erythema, induration, or other signs of local infection.   Skin prepped in a sterile fashion.   Local anesthesia: Topical Ethyl chloride.   With sterile technique and under real time ultrasound guidance:  40 mg of Kenalog and 2 mL of Marcaine injected easily.   Completed without difficulty   Pain partially immediately resolved suggesting accurate placement of the medication.   Advised to call if fevers/chills, erythema, induration, drainage, or persistent bleeding.   Images permanently stored and available for review in the ultrasound unit.  Impression: Technically successful ultrasound guided injection.      Impression and Recommendations:    Assessment and Plan: 64 y.o. female with left lateral hip pain due to trochanteric bursitis/hip abductor tendinopathy.  Plan for injection as above and physical therapy.  Patient does not drive but lives in [place] and thinks that should be able to get to physical therapy at least once a week.  If this becomes challenging could consider home health physical therapy as well.  Check back in about a month or so.  PDMP not reviewed this encounter. Orders Placed This Encounter  Procedures  . [place] LIMITED JOINT SPACE STRUCTURES LOW LEFT(NO LINKED CHARGES)    Order Specific Question:   Reason for Exam (SYMPTOM  OR DIAGNOSIS REQUIRED)    Answer:   L lateral hip pain    Order Specific Question:   Preferred imaging location?    Answer:   [facility]  . Ambulatory referral to Physical Therapy    Referral Priority:   Routine  Referral Type:   Physical Medicine    Referral Reason:   Specialty Services Required    Requested Specialty:   Physical Therapy   No orders of the defined types were placed in this encounter.   Discussed warning signs or symptoms. Please see discharge instructions. Patient expresses  understanding.   The above documentation has been reviewed and is accurate and complete [name], M.D.

## 2019-12-14 ENCOUNTER — Ambulatory Visit (HOSPITAL_COMMUNITY): Payer: Medicare Other

## 2019-12-16 ENCOUNTER — Telehealth (HOSPITAL_COMMUNITY): Payer: Self-pay

## 2019-12-16 NOTE — Telephone Encounter (Signed)
Called to reschedule angiogram, no answer, left vm. AW  

## 2019-12-17 ENCOUNTER — Ambulatory Visit (INDEPENDENT_AMBULATORY_CARE_PROVIDER_SITE_OTHER): Payer: Medicare Other | Admitting: Rehabilitative and Restorative Service Providers"

## 2019-12-17 ENCOUNTER — Encounter: Payer: Self-pay | Admitting: Rehabilitative and Restorative Service Providers"

## 2019-12-17 ENCOUNTER — Other Ambulatory Visit: Payer: Self-pay

## 2019-12-17 DIAGNOSIS — M6281 Muscle weakness (generalized): Secondary | ICD-10-CM | POA: Diagnosis not present

## 2019-12-17 DIAGNOSIS — R2689 Other abnormalities of gait and mobility: Secondary | ICD-10-CM

## 2019-12-17 DIAGNOSIS — M25552 Pain in left hip: Secondary | ICD-10-CM

## 2019-12-17 DIAGNOSIS — R29898 Other symptoms and signs involving the musculoskeletal system: Secondary | ICD-10-CM

## 2019-12-17 NOTE — Patient Instructions (Signed)
Access Code: [email or web address]: [email or web address]: 07/02/2021Prepared by: Jalen Oberry HoltExercises  Supine Piriformis Stretch with Leg Straight - 2 x daily - 7 x weekly - 1 sets - 3 reps - 30 sec hold  Supine March - 2 x daily - 7 x weekly - 1 sets - 10 reps - 2-3 sec hold  Supine Hip Internal and External Rotation - 2 x daily - 7 x weekly - 1 sets - 10 reps - 1-2 sec hold

## 2019-12-17 NOTE — Therapy (Signed)
[place], [place], [address] Phone: [phone]   Fax:  [phone]  Physical Therapy Evaluation  Patient Details  Name: [name] MRN: [id number] Date of Birth: 12-Sep-1955 Referring Provider (PT): Dr [name]    Encounter Date: 12/17/2019   PT End of Session - 12/17/19 1626    Visit Number 1    Number of Visits 12    Date for PT Re-Evaluation 01/28/20    PT Start Time 8938    PT Stop Time 1438    PT Time Calculation (min) 53 min    Activity Tolerance Patient tolerated treatment well           Past Medical History:  Diagnosis Date  . Allergic rhinitis   . Arthritis   . Brain aneurysm   . Chronic abdominal pain   . COPD (chronic obstructive pulmonary disease) ([place])    denies  . Depression    bipolar  . Fibromyalgia   . Gastroparesis   . GERD (gastroesophageal reflux disease)   . H/O hiatal hernia   . History of colonic polyps   . HLD (hyperlipidemia)   . IBS (irritable bowel syndrome)    chronic constipation  . Migraines   . OCD (obsessive compulsive disorder)    anxiety  . Peripheral neuropathy     Past Surgical History:  Procedure Laterality Date  . ABDOMINAL HYSTERECTOMY    . APPENDECTOMY    . CERVICAL DISC SURGERY    . cholecystectomy    . coil to brain aneurysm    . COLONOSCOPY    . ELBOW SURGERY Bilateral    tendonitis  . IR ANGIO INTRA EXTRACRAN SEL COM CAROTID INNOMINATE BILAT MOD SED  07/31/2017  . IR ANGIO VERTEBRAL SEL VERTEBRAL BILAT MOD SED  07/31/2017  . NECK SURGERY    . OOPHORECTOMY    . RADIOLOGY WITH ANESTHESIA N/A 05/10/2013   Procedure: RADIOLOGY WITH ANESTHESIA;  Surgeon: Rob Hickman, MD;  Location: [facility];  Service: Radiology;  Laterality: N/A;  . SHOULDER SURGERY Left   . TUBAL LIGATION    . VESICOVAGINAL FISTULA CLOSURE W/ TAH      There were no vitals filed for this visit.    Subjective Assessment - 12/17/19 1325    Subjective  Patient reports that she has had Lt hip pain for the past 6 months with no known injury. Pain has increased in the past 6 weeks. Pain is in the side of the hip running down to the knee    Pertinent History DDD lumbar spine; osteoarthritis; reflux; essential tremor: stent placement for brain aneurysm; on disability due to LBP    Diagnostic tests xrays    Patient Stated Goals get rid of the pain and get stronger; walking straighter    Currently in Pain? Yes    Pain Score 6     Pain Location Hip    Pain Orientation Left;Lateral    Pain Descriptors / Indicators Sharp    Pain Type Chronic pain    Pain Radiating Towards lateral hip to knee    Pain Onset More than a month ago    Pain Frequency Constant    Aggravating Factors  walking; standing; reaching; bending; lifting    Pain Relieving Factors lying down              [facility] PT Assessment - 12/17/19 0001      Assessment   Medical Diagnosis Lt lateral hip pain  Referring Provider (PT) Dr [name]     Onset Date/Surgical Date 06/18/19   symptoms increased in the past 6 weeks(mid May)   Hand Dominance Left    Next MD Visit 01/18/20    Prior Therapy none for hip; some PT for LBP       Precautions   Precautions None      Restrictions   Weight Bearing Restrictions No      Balance Screen   Has the patient fallen in the past 6 months Yes    How many times? 1    Has the patient had a decrease in activity level because of a fear of falling?  No    Is the patient reluctant to leave their home because of a fear of falling?  No      Home Environment   Living Environment Private residence    Living Arrangements Alone    Type of [place] Access Other (comment)   3-4 inches [place] One level      Prior Function   Level of Independence Independent    Vocation On disability   for LBP since 2000   Vocation Requirements household chores - unable to vacuum or mop    Leisure TV; water exercise 3 times a week  for 2 hours each day       Observation/Other Assessments   Focus on Therapeutic Outcomes (FOTO)  59%       Sensation   Additional Comments WFL's       Posture/Postural Control   Posture Comments head forward; shoulders rounded; flexed forward at hips       AROM   Right Hip Extension --   limited at neutral    Right Hip Flexion --   full ROM    Left Hip Extension --   limited at neutral    Left Hip Flexion --   WFL's slightly tight at end range   Right/Left Knee --   WNL's bilat    Right/Left Ankle --   WFL's bilat      Strength   Right Hip Flexion 4+/5    Right Hip Extension 3-/5    Right Hip ABduction 4-/5    Left Hip Flexion 4-/5    Left Hip Extension 3-/5    Left Hip ABduction 3/5    Right Knee Flexion 5/5    Right Knee Extension 5/5    Left Knee Flexion 4+/5    Left Knee Extension 5/5    Right Ankle Dorsiflexion 5/5    Left Ankle Dorsiflexion 4+/5   pain Lt hip      Flexibility   Hamstrings tigth Rt 75 deg; Lt 65 deg     Quadriceps tight Lt > Rt     ITB tight Lt > Rt     Piriformis tight Lt > Rt       Palpation   Spinal mobility hypomobility through the lumbar spine with CPA mobs     Palpation comment significant muscular tightness bilat lumbar musculature; Lt piriformis and hip abductors       Ambulation/Gait   Gait Comments ambulates with flexed forward posture at trunk, hips and knees - single point cane held in Lt hand                       Objective measurements completed on examination: See above findings.       [facility] Adult PT  Treatment/Exercise - 12/17/19 0001      Self-Care   Self-Care --   instructed to avoid sitting with legs crossed      Knee/Hip Exercises: Stretches   Passive Hamstring Stretch Limitations trial of HS stretch increased pain in Lt posterior hip area     Piriformis Stretch Left;3 reps;30 seconds   supine travell with strap      Knee/Hip Exercises: Supine   Other Supine Knee/Hip Exercises marching in hooklying x  10 each LE     Other Supine Knee/Hip Exercises windshield wiper in hooklying x 10 each LE       Moist Heat Therapy   Number Minutes Moist Heat 10 Minutes    Moist Heat Location Hip;Lumbar Spine      Electrical Stimulation   Electrical Stimulation Location Lt posterior hip     Electrical Stimulation Action IFC    Electrical Stimulation Parameters to tolerance     Electrical Stimulation Goals Pain;Tone                  PT Education - 12/17/19 1402    Education Details HEP POC - avoid sitting with legs crossed    Person(s) Educated Patient    Methods Explanation;Demonstration;Tactile cues;Verbal cues;Handout    Comprehension Verbalized understanding;Returned demonstration;Verbal cues required;Tactile cues required               PT Long Term Goals - 12/17/19 1637      PT LONG TERM GOAL #1   Title Decrease pain allowing patient to walk for 10 min and stand for 5 min without increased pain    Time 6    Period Weeks    Status New    Target Date 01/28/20      PT LONG TERM GOAL #2   Title Increase LE strength by 1/2 to 1 muscle grade    Time 6    Period Weeks    Status New    Target Date 01/28/20      PT LONG TERM GOAL #3   Title Patient to demonstrate improved gait pattern with more normal gait with proper use of assistive device    Time 6    Period Weeks    Status New    Target Date 01/28/20      PT LONG TERM GOAL #4   Title Independent in HEP    Time 6    Period Weeks    Status New    Target Date 01/28/20      PT LONG TERM GOAL #5   Title Improve FOTO to </= 38% limitation    Time 6    Period Weeks    Status New    Target Date 01/28/20                  Plan - 12/17/19 1631    Clinical Impression Statement Patient presents with 6 month hixtory of Lt posterior hip pain with symptoms increasing in the past 6 weeks with pain now into the Lt lateral hip to the knee. She has poor posture and alignment; limited hip mobility; decreased LE  strength; muscular tightness and pain with palpation through the Lt posterior hip; abnormal gait; limited functional abilities; pain on a daily basis. patient will benefit from PT to address problems identified.    Stability/Clinical Decision Making Stable/Uncomplicated    Clinical Decision Making Low    Rehab Potential Good    PT Frequency 2x / week    PT Duration 6  weeks    PT Treatment/Interventions Patient/family education;ADLs/Self Care Home Management;Cryotherapy;Electrical Stimulation;Iontophoresis 4mg /ml Dexamethasone;Moist Heat;Gait training;Stair training;Functional mobility training;Therapeutic activities;Therapeutic exercise;Balance training;Neuromuscular re-education;Manual techniques;Dry needling;Taping;Aquatic Therapy    PT Next Visit Plan review HEP; progress with soft tissue work through the Lt posterior hip - pirifromis/hip abductors; stretching; strengthening bilat LE's; functional strengtheing and gait training as pain subsides; modalities as indicated    PT Home Exercise Plan [email or web address]    Consulted and Agree with Plan of Care Patient           Patient will benefit from skilled therapeutic intervention in order to improve the following deficits and impairments:     Visit Diagnosis: Pain in left hip - Plan: PT plan of care cert/re-cert  Other symptoms and signs involving the musculoskeletal system - Plan: PT plan of care cert/re-cert  Muscle weakness (generalized) - Plan: PT plan of care cert/re-cert  Other abnormalities of gait and mobility - Plan: PT plan of care cert/re-cert     Problem List Patient Active Problem List   Diagnosis Date Noted  . Left hip pain 12/07/2019  . RLQ abdominal pain 02/09/2018  . Epigastric pain 08/11/2017  . Chronic RLQ pain 08/11/2017  . Chest pain 08/11/2017  . Smoker 08/11/2017  . Eustachian tube dysfunction, bilateral 02/06/2017  . Hyperglycemia 08/09/2016  . Preventative health care 08/09/2016  . Urinary incontinence  08/09/2016  . Hoarseness 08/09/2016  . Left elbow pain 11/30/2015  . Cellulitis of elbow 10/26/2015  . Acute sinus infection 08/01/2015  . COPD exacerbation ([place]) 08/01/2015  . Abnormal TSH 04/01/2015  . Aspiration pneumonia ([place]) 04/01/2015  . Hoarseness or changing voice 03/29/2014  . Dysphagia 02/06/2014  . Dysuria 09/02/2013  . Chronic constipation 09/02/2013  . Hypotension, unspecified 05/20/2013  . Recurrent falls 05/20/2013  . Nausea alone 01/31/2013  . Orthostatic hypotension 01/29/2013  . Knee effusion, right 07/03/2012  . Chronic low back pain 05/21/2012  . Tremor 04/30/2012  . Lower abdominal pain 07/30/2011  . Low back pain 07/30/2011  . Urinary incontinence, mixed 07/30/2011  . Left knee pain 07/30/2011  . Right shoulder pain 05/17/2011  . Insomnia 03/02/2011  . Brain aneurysm 02/15/2011  . INSOMNIA-SLEEP DISORDER-UNSPEC 08/31/2010  . ULNAR NEUROPATHY 10/31/2009  . Vitamin D deficiency 09/27/2009  . CHRONIC MIGRAINE W/O AURA W/INTRACTABLE W/O SM 05/02/2009  . LEUKOCYTOSIS 04/05/2009  . INTERMITTENT VERTIGO 02/16/2009  . PERIPHERAL EDEMA 02/16/2009  . COPD (chronic obstructive pulmonary disease) ([place]) 09/21/2008  . PULMONARY NODULE 09/21/2008  . GASTRITIS 09/19/2008  . Gastroparesis 09/19/2008  . Blind loop syndrome 09/01/2008  . B12 deficiency 08/18/2008  . BIPOLAR I D/O MOST RECENT EPIS DEPRESSED MILD 08/16/2008  . FIBROMYALGIA 07/20/2008  . WEIGHT LOSS 07/11/2008  . HLD (hyperlipidemia) 07/20/2007  . ANXIETY 07/20/2007  . PERIPHERAL NEUROPATHY 07/20/2007  . Allergic rhinitis 07/20/2007  . GERD 07/20/2007  . IBS 07/20/2007  . COLONIC POLYPS, HX OF 07/20/2007  . Depression 11/18/2006    Byard Carranza Nilda Simmer PT, MPH  12/17/2019, 4:44 PM  [facility] [place], [place], [address] Phone: [phone]   Fax:  [phone]  Name: [name] MRN: [id number] Date of Birth: 1955-11-21

## 2019-12-22 ENCOUNTER — Other Ambulatory Visit: Payer: Self-pay

## 2019-12-22 ENCOUNTER — Ambulatory Visit (INDEPENDENT_AMBULATORY_CARE_PROVIDER_SITE_OTHER): Payer: Medicare Other | Admitting: Rehabilitative and Restorative Service Providers"

## 2019-12-22 ENCOUNTER — Encounter: Payer: Self-pay | Admitting: Rehabilitative and Restorative Service Providers"

## 2019-12-22 DIAGNOSIS — R29898 Other symptoms and signs involving the musculoskeletal system: Secondary | ICD-10-CM | POA: Diagnosis not present

## 2019-12-22 DIAGNOSIS — M6281 Muscle weakness (generalized): Secondary | ICD-10-CM | POA: Diagnosis not present

## 2019-12-22 DIAGNOSIS — M25552 Pain in left hip: Secondary | ICD-10-CM

## 2019-12-22 DIAGNOSIS — R2689 Other abnormalities of gait and mobility: Secondary | ICD-10-CM | POA: Diagnosis not present

## 2019-12-22 NOTE — Patient Instructions (Signed)
Access Code: [email or web address]: [email or web address]: 07/07/2021Prepared by: Manvi Guilliams HoltExercises  Supine Piriformis Stretch with Leg Straight - 2 x daily - 7 x weekly - 1 sets - 3 reps - 30 sec hold  Supine March - 2 x daily - 7 x weekly - 1 sets - 10 reps - 2-3 sec hold  Supine Hip Internal and External Rotation - 2 x daily - 7 x weekly - 1 sets - 10 reps - 1-2 sec hold  Hooklying Isometric Clamshell - 2 x daily - 7 x weekly - 1 sets - 10 reps - 3 sec hold

## 2019-12-22 NOTE — Therapy (Addendum)
[place], [place], [address] Phone: [phone]   Fax:  [phone]  Physical Therapy Treatment  Patient Details  Name: [name] MRN: [id number] Date of Birth: 1955-12-02 Referring Provider (PT): Dr [name]    Encounter Date: 12/22/2019   PT End of Session - 12/22/19 1524    Visit Number 2    Number of Visits 12    Date for PT Re-Evaluation 01/28/20    PT Start Time 2878    PT Stop Time 1602    PT Time Calculation (min) 46 min    Activity Tolerance Patient tolerated treatment well           Past Medical History:  Diagnosis Date  . Allergic rhinitis   . Arthritis   . Brain aneurysm   . Chronic abdominal pain   . COPD (chronic obstructive pulmonary disease) ([place])    denies  . Depression    bipolar  . Fibromyalgia   . Gastroparesis   . GERD (gastroesophageal reflux disease)   . H/O hiatal hernia   . History of colonic polyps   . HLD (hyperlipidemia)   . IBS (irritable bowel syndrome)    chronic constipation  . Migraines   . OCD (obsessive compulsive disorder)    anxiety  . Peripheral neuropathy     Past Surgical History:  Procedure Laterality Date  . ABDOMINAL HYSTERECTOMY    . APPENDECTOMY    . CERVICAL DISC SURGERY    . cholecystectomy    . coil to brain aneurysm    . COLONOSCOPY    . ELBOW SURGERY Bilateral    tendonitis  . IR ANGIO INTRA EXTRACRAN SEL COM CAROTID INNOMINATE BILAT MOD SED  07/31/2017  . IR ANGIO VERTEBRAL SEL VERTEBRAL BILAT MOD SED  07/31/2017  . NECK SURGERY    . OOPHORECTOMY    . RADIOLOGY WITH ANESTHESIA N/A 05/10/2013   Procedure: RADIOLOGY WITH ANESTHESIA;  Surgeon: Rob Hickman, MD;  Location: [facility];  Service: Radiology;  Laterality: N/A;  . SHOULDER SURGERY Left   . TUBAL LIGATION    . VESICOVAGINAL FISTULA CLOSURE W/ TAH      There were no vitals filed for this visit.   Subjective Assessment - 12/22/19 1524    Subjective  Patient reports that her essential tremors seem to be worse. She sees a neurologist 12/29/19. LBP and Lt hip continue and have been worse in the past couple day.    Currently in Pain? Yes    Pain Score 8     Pain Location Hip    Pain Orientation Left    Pain Descriptors / Indicators Sharp;Stabbing    Pain Type Chronic pain    Pain Radiating Towards lateral hip to knee    Pain Onset More than a month ago    Pain Frequency Constant                             [facility] Adult PT Treatment/Exercise - 12/22/19 0001      Knee/Hip Exercises: Stretches   Piriformis Stretch Left;Right;3 reps;30 seconds   supine travell with strap      Knee/Hip Exercises: Aerobic   Nustep L4 LE only x 5 reps       Knee/Hip Exercises: Supine   [place] Limitations painful in Lt hip     Knee Flexion Limitations marching in hooklying x 10 each LE  Other Supine Knee/Hip Exercises hip abduction in supine alternating LE's red TB x 10 each LE     Other Supine Knee/Hip Exercises windshield wiper in hooklying x 10 each LE       Moist Heat Therapy   Number Minutes Moist Heat 15 Minutes    Moist Heat Location Hip;Lumbar Spine      Electrical Stimulation   Electrical Stimulation Location Lt posterior hip     Electrical Stimulation Action IFC    Electrical Stimulation Parameters to tolerance    Electrical Stimulation Goals Pain;Tone      Manual Therapy   Manual therapy comments pt prone     Joint Mobilization PA mobs Lt hip     Soft tissue mobilization deep tissue work to tolerance(min tolerance initially - improved with time) through the Lt posterior hip - piriformis/hip abductors     Myofascial Release posterior Lt hip     Passive ROM IR/ER Lt hip with hip in ext and knee flexed deep pressure at hip                   PT Education - 12/22/19 1603    Education Details HEP myofacial release Lt hip in standing    Person(s) Educated Patient    Methods Explanation;Demonstration;Tactile  cues;Verbal cues;Handout    Comprehension Verbalized understanding;Returned demonstration;Verbal cues required;Tactile cues required               PT Long Term Goals - 12/17/19 1637      PT LONG TERM GOAL #1   Title Decrease pain allowing patient to walk for 10 min and stand for 5 min without increased pain    Time 6    Period Weeks    Status New    Target Date 01/28/20      PT LONG TERM GOAL #2   Title Increase LE strength by 1/2 to 1 muscle grade    Time 6    Period Weeks    Status New    Target Date 01/28/20      PT LONG TERM GOAL #3   Title Patient to demonstrate improved gait pattern with more normal gait with proper use of assistive device    Time 6    Period Weeks    Status New    Target Date 01/28/20      PT LONG TERM GOAL #4   Title Independent in HEP    Time 6    Period Weeks    Status New    Target Date 01/28/20      PT LONG TERM GOAL #5   Title Improve FOTO to </= 38% limitation    Time 6    Period Weeks    Status New    Target Date 01/28/20                 Plan - 12/22/19 1526    Clinical Impression Statement Patient reports continued Lt hip and LBP which has ben worse in the past couple of days. Good response to deep tissue work and PROM/stretch to IR/ER.    Rehab Potential Good    PT Frequency 2x / week    PT Duration 6 weeks    PT Treatment/Interventions Patient/family education;ADLs/Self Care Home Management;Cryotherapy;Electrical Stimulation;Iontophoresis 107m/ml Dexamethasone;Moist Heat;Gait training;Stair training;Functional mobility training;Therapeutic activities;Therapeutic exercise;Balance training;Neuromuscular re-education;Manual techniques;Dry needling;Taping;Aquatic Therapy    PT Next Visit Plan review HEP; progress with soft tissue work through the Lt posterior hip - pirifromis/hip abductors; stretching; strengthening bilat  LE's; functional strengthening and gait training as pain subsides; modalities as indicated    PT Home  Exercise Plan [email or web address]    Consulted and Agree with Plan of Care Patient           Patient will benefit from skilled therapeutic intervention in order to improve the following deficits and impairments:     Visit Diagnosis: Pain in left hip  Other symptoms and signs involving the musculoskeletal system  Muscle weakness (generalized)  Other abnormalities of gait and mobility     Problem List Patient Active Problem List   Diagnosis Date Noted  . Left hip pain 12/07/2019  . RLQ abdominal pain 02/09/2018  . Epigastric pain 08/11/2017  . Chronic RLQ pain 08/11/2017  . Chest pain 08/11/2017  . Smoker 08/11/2017  . Eustachian tube dysfunction, bilateral 02/06/2017  . Hyperglycemia 08/09/2016  . Preventative health care 08/09/2016  . Urinary incontinence 08/09/2016  . Hoarseness 08/09/2016  . Left elbow pain 11/30/2015  . Cellulitis of elbow 10/26/2015  . Acute sinus infection 08/01/2015  . COPD exacerbation ([place]) 08/01/2015  . Abnormal TSH 04/01/2015  . Aspiration pneumonia ([place]) 04/01/2015  . Hoarseness or changing voice 03/29/2014  . Dysphagia 02/06/2014  . Dysuria 09/02/2013  . Chronic constipation 09/02/2013  . Hypotension, unspecified 05/20/2013  . Recurrent falls 05/20/2013  . Nausea alone 01/31/2013  . Orthostatic hypotension 01/29/2013  . Knee effusion, right 07/03/2012  . Chronic low back pain 05/21/2012  . Tremor 04/30/2012  . Lower abdominal pain 07/30/2011  . Low back pain 07/30/2011  . Urinary incontinence, mixed 07/30/2011  . Left knee pain 07/30/2011  . Right shoulder pain 05/17/2011  . Insomnia 03/02/2011  . Brain aneurysm 02/15/2011  . INSOMNIA-SLEEP DISORDER-UNSPEC 08/31/2010  . ULNAR NEUROPATHY 10/31/2009  . Vitamin D deficiency 09/27/2009  . CHRONIC MIGRAINE W/O AURA W/INTRACTABLE W/O SM 05/02/2009  . LEUKOCYTOSIS 04/05/2009  . INTERMITTENT VERTIGO 02/16/2009  . PERIPHERAL EDEMA 02/16/2009  . COPD (chronic obstructive pulmonary disease)  ([place]) 09/21/2008  . PULMONARY NODULE 09/21/2008  . GASTRITIS 09/19/2008  . Gastroparesis 09/19/2008  . Blind loop syndrome 09/01/2008  . B12 deficiency 08/18/2008  . BIPOLAR I D/O MOST RECENT EPIS DEPRESSED MILD 08/16/2008  . FIBROMYALGIA 07/20/2008  . WEIGHT LOSS 07/11/2008  . HLD (hyperlipidemia) 07/20/2007  . ANXIETY 07/20/2007  . PERIPHERAL NEUROPATHY 07/20/2007  . Allergic rhinitis 07/20/2007  . GERD 07/20/2007  . IBS 07/20/2007  . COLONIC POLYPS, HX OF 07/20/2007  . Depression 11/18/2006     Nilda Simmer PT, MPH  12/22/2019, 4:08 PM  [facility] [place], [place], [address] Phone: [phone]   Fax:  [phone]  Name: [name] MRN: [id number] Date of Birth: 1955-10-15  PHYSICAL THERAPY DISCHARGE SUMMARY  Visits from Start of Care: 2  Current functional level related to goals / functional outcomes: See last progress note for discharge status    Remaining deficits: Unchanged    Education / Equipment: Initial HEP  Plan: Patient agrees to discharge.  Patient goals were not met. Patient is being discharged due to not returning since the last visit.  ?????      P. Helene Kelp PT, MPH 02/23/20 2:17 PM

## 2019-12-23 ENCOUNTER — Ambulatory Visit (HOSPITAL_COMMUNITY): Admission: RE | Admit: 2019-12-23 | Payer: Medicare Other | Source: Ambulatory Visit

## 2019-12-28 ENCOUNTER — Other Ambulatory Visit: Payer: Self-pay | Admitting: Radiology

## 2019-12-29 ENCOUNTER — Ambulatory Visit: Payer: Self-pay | Admitting: Neurology

## 2019-12-30 ENCOUNTER — Other Ambulatory Visit (HOSPITAL_COMMUNITY): Payer: Self-pay | Admitting: Interventional Radiology

## 2019-12-30 ENCOUNTER — Ambulatory Visit (HOSPITAL_COMMUNITY)
Admission: RE | Admit: 2019-12-30 | Discharge: 2019-12-30 | Disposition: A | Discharge status: Home / Self Care | Payer: Medicare Other | Source: Ambulatory Visit | Attending: Interventional Radiology | Admitting: Interventional Radiology

## 2019-12-30 ENCOUNTER — Other Ambulatory Visit: Payer: Self-pay

## 2019-12-30 DIAGNOSIS — Z90710 Acquired absence of both cervix and uterus: Secondary | ICD-10-CM | POA: Diagnosis not present

## 2019-12-30 DIAGNOSIS — Z7982 Long term (current) use of aspirin: Secondary | ICD-10-CM | POA: Insufficient documentation

## 2019-12-30 DIAGNOSIS — M545 Low back pain: Secondary | ICD-10-CM | POA: Diagnosis not present

## 2019-12-30 DIAGNOSIS — Z888 Allergy status to other drugs, medicaments and biological substances status: Secondary | ICD-10-CM | POA: Diagnosis not present

## 2019-12-30 DIAGNOSIS — Z885 Allergy status to narcotic agent status: Secondary | ICD-10-CM | POA: Diagnosis not present

## 2019-12-30 DIAGNOSIS — K219 Gastro-esophageal reflux disease without esophagitis: Secondary | ICD-10-CM | POA: Insufficient documentation

## 2019-12-30 DIAGNOSIS — Z886 Allergy status to analgesic agent status: Secondary | ICD-10-CM | POA: Insufficient documentation

## 2019-12-30 DIAGNOSIS — Z79899 Other long term (current) drug therapy: Secondary | ICD-10-CM | POA: Diagnosis not present

## 2019-12-30 DIAGNOSIS — I729 Aneurysm of unspecified site: Secondary | ICD-10-CM

## 2019-12-30 DIAGNOSIS — F419 Anxiety disorder, unspecified: Secondary | ICD-10-CM | POA: Insufficient documentation

## 2019-12-30 DIAGNOSIS — Z9189 Other specified personal risk factors, not elsewhere classified: Secondary | ICD-10-CM | POA: Diagnosis not present

## 2019-12-30 DIAGNOSIS — E785 Hyperlipidemia, unspecified: Secondary | ICD-10-CM | POA: Diagnosis not present

## 2019-12-30 DIAGNOSIS — I739 Peripheral vascular disease, unspecified: Secondary | ICD-10-CM | POA: Diagnosis not present

## 2019-12-30 DIAGNOSIS — K589 Irritable bowel syndrome without diarrhea: Secondary | ICD-10-CM | POA: Diagnosis not present

## 2019-12-30 DIAGNOSIS — M1990 Unspecified osteoarthritis, unspecified site: Secondary | ICD-10-CM | POA: Insufficient documentation

## 2019-12-30 DIAGNOSIS — Z791 Long term (current) use of non-steroidal anti-inflammatories (NSAID): Secondary | ICD-10-CM | POA: Diagnosis not present

## 2019-12-30 DIAGNOSIS — F429 Obsessive-compulsive disorder, unspecified: Secondary | ICD-10-CM | POA: Diagnosis not present

## 2019-12-30 DIAGNOSIS — G8929 Other chronic pain: Secondary | ICD-10-CM | POA: Diagnosis not present

## 2019-12-30 DIAGNOSIS — M797 Fibromyalgia: Secondary | ICD-10-CM | POA: Insufficient documentation

## 2019-12-30 DIAGNOSIS — I671 Cerebral aneurysm, nonruptured: Secondary | ICD-10-CM | POA: Insufficient documentation

## 2019-12-30 DIAGNOSIS — J449 Chronic obstructive pulmonary disease, unspecified: Secondary | ICD-10-CM | POA: Diagnosis not present

## 2019-12-30 HISTORY — PX: IR ANGIO INTRA EXTRACRAN SEL COM CAROTID INNOMINATE BILAT MOD SED: IMG5360

## 2019-12-30 HISTORY — PX: IR ANGIO VERTEBRAL SEL VERTEBRAL BILAT MOD SED: IMG5369

## 2019-12-30 LAB — CBC
HCT: 47.9 % — ABNORMAL HIGH (ref 36.0–46.0)
Hemoglobin: 15.1 g/dL — ABNORMAL HIGH (ref 12.0–15.0)
MCH: 32.7 pg (ref 26.0–34.0)
MCHC: 31.5 g/dL (ref 30.0–36.0)
MCV: 103.7 fL — ABNORMAL HIGH (ref 80.0–100.0)
Platelets: 258 10*3/uL (ref 150–400)
RBC: 4.62 MIL/uL (ref 3.87–5.11)
RDW: 15 % (ref 11.5–15.5)
WBC: 7.4 10*3/uL (ref 4.0–10.5)
nRBC: 0 % (ref 0.0–0.2)

## 2019-12-30 LAB — BASIC METABOLIC PANEL
Anion gap: 10 (ref 5–15)
BUN: 9 mg/dL (ref 8–23)
CO2: 29 mmol/L (ref 22–32)
Calcium: 10.1 mg/dL (ref 8.9–10.3)
Chloride: 101 mmol/L (ref 98–111)
Creatinine, Ser: 1.02 mg/dL — ABNORMAL HIGH (ref 0.44–1.00)
GFR calc Af Amer: >60 mL/min (ref >60)
GFR calc non Af Amer: 59 mL/min — ABNORMAL LOW (ref >60)
Glucose, Bld: 111 mg/dL — ABNORMAL HIGH (ref 70–99)
Potassium: 3.6 mmol/L (ref 3.5–5.1)
Sodium: 140 mmol/L (ref 135–145)

## 2019-12-30 LAB — PROTIME-INR
INR: 0.9 (ref 0.8–1.2)
Prothrombin Time: 11.4 seconds (ref 11.4–15.2)

## 2019-12-30 MED ORDER — IOHEXOL 300 MG/ML  SOLN
150.0000 mL | Freq: Once | INTRAMUSCULAR | Status: AC | PRN
  Administered 2019-12-30: 75 mL via INTRAVENOUS

## 2019-12-30 MED ORDER — SODIUM CHLORIDE 0.9 % IV SOLN: Freq: Once | INTRAVENOUS | Status: DC

## 2019-12-30 MED ORDER — HEPARIN SODIUM (PORCINE) 1000 UNIT/ML IJ SOLN
INTRAMUSCULAR | Status: AC | PRN
  Administered 2019-12-30: 1000 [IU] via INTRAVENOUS

## 2019-12-30 MED ORDER — MIDAZOLAM HCL 2 MG/2ML IJ SOLN
INTRAMUSCULAR | Status: AC
  Filled 2019-12-30: qty 2

## 2019-12-30 MED ORDER — MIDAZOLAM HCL 2 MG/2ML IJ SOLN
INTRAMUSCULAR | Status: AC | PRN
  Administered 2019-12-30: 0.5 mg via INTRAVENOUS

## 2019-12-30 MED ORDER — SODIUM CHLORIDE 0.9 % IV BOLUS
INTRAVENOUS | Status: AC | PRN
  Administered 2019-12-30: 250 mL via INTRAVENOUS

## 2019-12-30 MED ORDER — HEPARIN SODIUM (PORCINE) 1000 UNIT/ML IJ SOLN
INTRAMUSCULAR | Status: AC
  Filled 2019-12-30: qty 1

## 2019-12-30 MED ORDER — LIDOCAINE HCL 1 % IJ SOLN
INTRAMUSCULAR | Status: AC | PRN
  Administered 2019-12-30: 15 mL

## 2019-12-30 MED ORDER — FENTANYL CITRATE (PF) 100 MCG/2ML IJ SOLN
INTRAMUSCULAR | Status: AC | PRN
  Administered 2019-12-30: 12.5 ug via INTRAVENOUS

## 2019-12-30 MED ORDER — LIDOCAINE HCL 1 % IJ SOLN
INTRAMUSCULAR | Status: AC
  Filled 2019-12-30: qty 20

## 2019-12-30 MED ORDER — SODIUM CHLORIDE 0.9 % IV SOLN: INTRAVENOUS | Status: AC

## 2019-12-30 MED ORDER — FENTANYL CITRATE (PF) 100 MCG/2ML IJ SOLN
INTRAMUSCULAR | Status: AC
  Filled 2019-12-30: qty 2

## 2019-12-30 NOTE — H&P (Addendum)
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  She underwent stenting of a right MCA aneurysm in 2007, basilar artery aneurysm stent in 2007, endovascular treatment of distal basilar artery to sidewall aneurysms right larger than left at the level of the superior cerebellar arteries in November 2014 with LVIS stent placed.  Last CTA of head/neck in June 2020 revealed:  1. Continued stable intracranial CTA: - patent basilar artery through right PCA stent. Patent right M1 stent. - dolichoectatic supraclinoid left ICA and distal right M1. - stable mostly 2-3 mm intracranial aneurysms of the: - right SCA (4-5 mm), left PCA origins - left ICA terminus, right MCA bifurcation. 2. Stable CTA neck, remarkable for bilateral ICA fibromuscular dysplasia (FMD). 3. No arterial stenosis identified in the head and neck. 4. Stable and negative non-contrast CT appearance of the brain. 5. Previous C5 through C7 ACDF with advanced adjacent segment disease at C4-C5   She presents today for follow-up diagnostic cerebral arteriogram.  She currently denies fever, chest pain, dyspnea, cough, abdominal pain, nausea, vomiting or bleeding.  She does have intermittent headaches and chronic low back pain.  She uses a walker to assist with ambulation and continues to smoke.  She is on aspirin.  Additional medical history as below.  Past Medical History:  Diagnosis Date  . Allergic rhinitis   . Arthritis   . Brain aneurysm   . Chronic abdominal pain   . COPD (chronic obstructive pulmonary disease) ([place])    denies  . Depression    bipolar  . Fibromyalgia   . Gastroparesis   . GERD (gastroesophageal reflux disease)   . H/O  hiatal hernia   . History of colonic polyps   . HLD (hyperlipidemia)   . IBS (irritable bowel syndrome)    chronic constipation  . Migraines   . OCD (obsessive compulsive disorder)    anxiety  . Peripheral neuropathy    Past Surgical History:  Procedure Laterality Date  . ABDOMINAL HYSTERECTOMY    . APPENDECTOMY    . CERVICAL DISC SURGERY    . cholecystectomy    . coil to brain aneurysm    . COLONOSCOPY    . ELBOW SURGERY Bilateral    tendonitis  . IR ANGIO INTRA EXTRACRAN SEL COM CAROTID INNOMINATE BILAT MOD SED  07/31/2017  . IR ANGIO VERTEBRAL SEL VERTEBRAL BILAT MOD SED  07/31/2017  . NECK SURGERY    . OOPHORECTOMY    . RADIOLOGY WITH ANESTHESIA N/A 05/10/2013   Procedure: RADIOLOGY WITH ANESTHESIA;  Surgeon: Rob Hickman, MD;  Location: [place];  Service: Radiology;  Laterality: N/A;  . SHOULDER SURGERY Left   . TUBAL LIGATION    . VESICOVAGINAL FISTULA CLOSURE W/ TAH      Allergies: Ambien [zolpidem tartrate], Hydrocodone, Lactose intolerance (gi), Nsaids, Propoxyphene n-acetaminophen, Tramadol, and Chantix [varenicline]  Medications: Prior to Admission medications   Medication Sig Start Date End Date Taking? Authorizing Provider  aspirin 325 MG tablet Take 325 mg by mouth daily.   Yes [provider]  atorvastatin (LIPITOR) 40 MG tablet TAKE 1 TABLET BY MOUTH  DAILY Patient taking differently: Take 40 mg by mouth at bedtime.  04/06/19  Yes Biagio Borg, MD  buPROPion (WELLBUTRIN XL) 150 MG 24  hr tablet Take 1 tablet (150 mg total) by mouth in the morning. 10/26/19  Yes Hurst, Dorothea Glassman, PA-C  Cholecalciferol (VITAMIN D) 50 MCG (2000 UT) tablet Take 2,000 Units by mouth daily.   Yes [provider]  fexofenadine (ALLEGRA) 180 MG tablet Take 180 mg by mouth daily.   Yes [provider]  gabapentin (NEURONTIN) 800 MG tablet TAKE 1 TABLET 3 TIMES  DAILY THEREAFTER Patient taking differently: Take 800 mg by mouth 3 (three) times daily.   10/04/19  Yes Hurst, Dorothea Glassman, PA-C  lamoTRIgine (LAMICTAL) 100 MG tablet TAKE 1 TABLET BY MOUTH IN  THE MORNING AND 3 TABLETS  BY MOUTH AT BEDTIME Patient taking differently: Take 100-300 mg by mouth See admin instructions. Take 100 mg in the morning and 300 mg at bedtime 01/19/19  Yes Hurst, Teresa T, PA-C  OLANZapine (ZYPREXA) 10 MG tablet Take 2 tablets (20 mg total) by mouth at bedtime. 09/29/19  Yes Hurst, Dorothea Glassman, PA-C  omeprazole (PRILOSEC) 40 MG capsule Take 40 mg by mouth 2 (two) times daily.    Yes [provider]  Oxycodone HCl 20 MG TABS Take 1 tablet by mouth every 6 (six) hours as needed (pain).  07/20/16  Yes [provider]  potassium chloride (KLOR-CON) 10 MEQ tablet TAKE 1 TABLET BY MOUTH  DAILY Patient taking differently: Take 10 mEq by mouth daily.  04/06/19  Yes Biagio Borg, MD  risperidone (RISPERDAL) 4 MG tablet TAKE 1 TABLET BY MOUTH IN  THE MORNING AND 1 1/2 TABLETS  BY MOUTH AT BEDTIME Patient taking differently: Take 4-6 mg by mouth See admin instructions. Take 4 mg in the morning and 6 mg at night 04/28/19  Yes Hurst, Teresa T, PA-C  diclofenac (VOLTAREN) 75 MG EC tablet Take 75 mg by mouth 2 (two) times daily.     [provider]  meloxicam (MOBIC) 15 MG tablet Take 15 mg by mouth daily.     [provider]  naloxone [facility]) 4 MG/0.1ML LIQD nasal spray kit Place 0.4 mg into the nose as needed (opioid overdose).     [provider]  promethazine (PHENERGAN) 12.5 MG tablet Take 1 tablet by mouth  every 6 hours as needed for nausea Patient taking differently: Take 12.5 mg by mouth every 6 (six) hours as needed for nausea.  08/01/15   Biagio Borg, MD     Vital Signs: BP (!) 88/59   Pulse 84   Temp 97.7 F (36.5 C) (Oral)   Resp 20   Ht _0  (1.651 m)   Wt 137 lb (62.1 kg)   SpO2 94%   BMI 22.80 kg/m   Physical Exam awake, alert.  Chest clear to auscultation bilaterally.  Heart with regular rate and rhythm.  Abdomen  soft, positive bowel sounds, nontender.  No lower extremity edema.  Tongue midline, face symmetrical, strength 5/5 in all fours.  Imaging: No results found.  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  She underwent stenting of a right MCA aneurysm in 2007, basilar artery aneurysm stent in 2007, endovascular treatment of distal basilar artery to sidewall aneurysms right larger than left at the level of the superior cerebellar arteries in November 2014 with LVIS stent placed.  Last CTA of head/neck in June 2020 revealed:  1. Continued stable intracranial CTA: - patent basilar artery through right PCA stent. Patent right M1 stent. - dolichoectatic supraclinoid left ICA and distal right M1. - stable mostly 2-3 mm intracranial aneurysms of the: - right SCA (4-5 mm), left PCA origins - left ICA terminus, right MCA bifurcation. 2. Stable CTA neck, remarkable for bilateral ICA fibromuscular dysplasia (FMD). 3. No arterial stenosis identified in the head and neck. 4. Stable and negative non-contrast CT appearance of the brain. 5. 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  This interventional procedure involves the use of X-rays and because of the nature of the planned procedure, it is possible that we will have prolonged use of X-ray fluoroscopy.  Potential radiation risks to you include (but are not limited to) the following: - A slightly elevated risk for cancer  several years later in life. This risk is typically less than 0.5% percent. This risk is low in comparison to the normal incidence of human cancer, which is 33% for women and 50% for men according to the [place]. - Radiation induced injury can include skin redness, resembling a rash, tissue breakdown / ulcers and hair loss (which can be temporary or permanent).   The likelihood of either of these occurring depends on the difficulty of the procedure and whether you are sensitive to radiation due to previous procedures, disease, or genetic conditions.   IF your procedure requires a prolonged use of radiation, you will be notified and given written instructions for further action.  It is your responsibility to monitor the irradiated area for the 2 weeks following the procedure and to notify your physician if you are concerned that you have suffered a radiation induced injury.    All of the patient's questions were answered, patient is agreeable to proceed.  Consent signed and in chart.  BP soft, creat 1.02 today    Electronically Signed: D. Rowe Robert, PA-C 12/30/2019, 8:09 AM   I spent a total of 25 minutes at the the patient's bedside AND on the patient's hospital floor or unit, greater than 50% of which was counseling/coordinating care for cerebral arteriogram

## 2019-12-30 NOTE — Discharge Instructions (Signed)
Moderate Conscious Sedation, Adult, Care After These instructions provide you with information about caring for yourself after your procedure. Your health care provider may also give you more specific instructions. Your treatment has been planned according to current medical practices, but problems sometimes occur. Call your health care provider if you have any problems or questions after your procedure. What can I expect after the procedure? After your procedure, it is common:  To feel sleepy for several hours.  To feel clumsy and have poor balance for several hours.  To have poor judgment for several hours.  To vomit if you eat too soon. Follow these instructions at home: For at least 24 hours after the procedure:   Do not: ? Participate in activities where you could fall or become injured. ? Drive. ? Use heavy machinery. ? Drink alcohol. ? Take sleeping pills or medicines that cause drowsiness. ? Make important decisions or sign legal documents. ? Take care of children on your own.  Rest. Eating and drinking  Follow the diet recommended by your health care provider.  If you vomit: ? Drink water, juice, or soup when you can drink without vomiting. ? Make sure you have little or no nausea before eating solid foods. General instructions  Have a responsible adult stay with you until you are awake and alert.  Take over-the-counter and prescription medicines only as told by your health care provider.  If you smoke, do not smoke without supervision.  Keep all follow-up visits as told by your health care provider. This is important. Contact a health care provider if:  You keep feeling nauseous or you keep vomiting.  You feel light-headed.  You develop a rash.  You have a fever. Get help right away if:  You have trouble breathing. This information is not intended to replace advice given to you by your health care provider. Make sure you discuss any questions you have  with your health care provider. Document Revised: 05/16/2017 Document Reviewed: 09/23/2015 Elsevier Patient Education  [place] After This sheet gives you information about how to care for yourself after your procedure. Your doctor may also give you more specific instructions. If you have problems or questions, contact your doctor. Follow these instructions at home: Insertion site care  Follow instructions from your doctor about how to take care of your long, thin tube (catheter) insertion area. Make sure you: ? Wash your hands with soap and water before you change your bandage (dressing). If you cannot use soap and water, use hand sanitizer. ? Change your bandage as told by your doctor. ? Leave stitches (sutures), skin glue, or skin tape (adhesive) strips in place. They may need to stay in place for 2 weeks or longer. If tape strips get loose and curl up, you may trim the loose edges. Do not remove tape strips completely unless your doctor says it is okay.  Do not take baths, swim, or use a hot tub until your doctor says it is okay.  You may shower 24-48 hours after the procedure or as told by your doctor. ? Gently wash the area with plain soap and water. ? Pat the area dry with a clean towel. ? Do not rub the area. This may cause bleeding.  Do not apply powder or lotion to the area. Keep the area clean and dry.  Check your insertion area every day for signs of infection. Check for: ? More redness, swelling, or pain. ? Fluid or blood. ?  Warmth. ? Pus or a bad smell. Activity  Rest as told by your doctor, usually for 1-2 days.  Do not lift anything that is heavier than 10 lbs. (4.5 kg) or as told by your doctor.  Do not drive for 24 hours if you were given a medicine to help you relax (sedative).  Do not drive or use heavy machinery while taking prescription pain medicine. General instructions   Go back to your normal activities as told by your  doctor, usually in about a week. Ask your doctor what activities are safe for you.  If the insertion area starts to bleed, lie flat and put pressure on the area. If the bleeding does not stop, get help right away. This is an emergency.  Drink enough fluid to keep your pee (urine) clear or pale yellow.  Take over-the-counter and prescription medicines only as told by your doctor.  Keep all follow-up visits as told by your doctor. This is important. Contact a doctor if:  You have a fever.  You have chills.  You have more redness, swelling, or pain around your insertion area.  You have fluid or blood coming from your insertion area.  The insertion area feels warm to the touch.  You have pus or a bad smell coming from your insertion area.  You have more bruising around the insertion area.  Blood collects in the tissue around the insertion area (hematoma) that may be painful to the touch. Get help right away if:  You have a lot of pain in the insertion area.  The insertion area swells very fast.  The insertion area is bleeding, and the bleeding does not stop after holding steady pressure on the area.  The area near or just beyond the insertion area becomes pale, cool, tingly, or numb. These symptoms may be an emergency. Do not wait to see if the symptoms will go away. Get medical help right away. Call your local emergency services (911 in the [place] Do not drive yourself to the hospital. Summary  After the procedure, it is common to have bruising and tenderness at the long, thin tube insertion area.  After the procedure, it is important to rest and drink plenty of fluids.  Do not take baths, swim, or use a hot tub until your doctor says it is okay to do so. You may shower 24-48 hours after the procedure or as told by your doctor.  If the insertion area starts to bleed, lie flat and put pressure on the area. If the bleeding does not stop, get help right away. This is an  emergency. This information is not intended to replace advice given to you by your health care provider. Make sure you discuss any questions you have with your health care provider. Document Revised: 05/16/2017 Document Reviewed: 05/28/2016 Elsevier Patient Education  2020 [employer].

## 2019-12-30 NOTE — Procedures (Signed)
S/P 4 vessel cerebral artrriogram RT CFA approach. Findings. 1.35mm x 2.5 mm Lt PCOM aneurysm. 2.3.78mmx 75mm RT SCA aneurysm. 3.LT PCA 2.78mmx 1.7 mm aneurysm 4.appro 52mm RT MCA distal M1 reg aneurysm. S.Remy Voiles MD

## 2020-01-03 ENCOUNTER — Encounter: Payer: Medicare Other | Admitting: Rehabilitative and Restorative Service Providers"

## 2020-01-04 ENCOUNTER — Telehealth (HOSPITAL_COMMUNITY): Payer: Self-pay

## 2020-01-04 NOTE — Telephone Encounter (Signed)
Called to reschedule consult, no answer, left vm. AW  

## 2020-01-05 ENCOUNTER — Ambulatory Visit (HOSPITAL_COMMUNITY): Admission: RE | Admit: 2020-01-05 | Payer: Medicare Other | Source: Ambulatory Visit

## 2020-01-05 DIAGNOSIS — Z79891 Long term (current) use of opiate analgesic: Secondary | ICD-10-CM | POA: Diagnosis not present

## 2020-01-05 DIAGNOSIS — M542 Cervicalgia: Secondary | ICD-10-CM | POA: Diagnosis not present

## 2020-01-05 DIAGNOSIS — M5136 Other intervertebral disc degeneration, lumbar region: Secondary | ICD-10-CM | POA: Diagnosis not present

## 2020-01-05 DIAGNOSIS — M4726 Other spondylosis with radiculopathy, lumbar region: Secondary | ICD-10-CM | POA: Diagnosis not present

## 2020-01-05 DIAGNOSIS — M961 Postlaminectomy syndrome, not elsewhere classified: Secondary | ICD-10-CM | POA: Diagnosis not present

## 2020-01-06 ENCOUNTER — Other Ambulatory Visit: Payer: Self-pay

## 2020-01-06 ENCOUNTER — Ambulatory Visit (HOSPITAL_COMMUNITY)
Admission: RE | Admit: 2020-01-06 | Discharge: 2020-01-06 | Disposition: A | Discharge status: Home / Self Care | Payer: Medicare Other | Source: Ambulatory Visit | Attending: Interventional Radiology | Admitting: Interventional Radiology

## 2020-01-06 DIAGNOSIS — I729 Aneurysm of unspecified site: Secondary | ICD-10-CM

## 2020-01-10 ENCOUNTER — Encounter: Payer: Medicare Other | Admitting: Rehabilitative and Restorative Service Providers"

## 2020-01-18 ENCOUNTER — Encounter: Payer: Self-pay | Admitting: Family Medicine

## 2020-01-18 ENCOUNTER — Other Ambulatory Visit: Payer: Self-pay

## 2020-01-18 ENCOUNTER — Ambulatory Visit: Payer: Medicare Other | Admitting: Family Medicine

## 2020-01-18 VITALS — BP 94/62 | HR 94 | Ht 65.0 in | Wt 146.6 lb

## 2020-01-18 DIAGNOSIS — M25552 Pain in left hip: Secondary | ICD-10-CM

## 2020-01-18 NOTE — Progress Notes (Signed)
   I, Wendy Poet, LAT, ATC, am serving as scribe for Dr. [name].  [name] is a 64 y.o. female who presents to [place] at [facility] today for f/u of L lateral hip pain.  She was last seen by Dr. [name] on 12/09/19 and had a L GT injection and was referred to outpatient PT of which she has completed 2 visits.  Since her last visit, pt reports that the L GT injection helped for about 2.5 weeks.  She states she hasn't been to PT in a while due to trouble getting to/from.  She does not drive and is relying on her family to take her and having significant difficulty getting to physical therapy.  Diagnostic testing: L hip XR- 12/07/19   Pertinent review of systems: No fevers or chills  Relevant historical information: Vasculopathy with stents. Had MRI lspine in 2013 and had stents prior to this.   Exam:  BP 94/62 (BP Location: Right Arm, Patient Position: Sitting, Cuff Size: Normal)   Pulse 94   Ht 5\' 5"  (1.651 m)   Wt 146 lb 9.6 oz (66.5 kg)   SpO2 91%   BMI 24.40 kg/m  General: Well Developed, well nourished, and in no acute distress.   MSK: Left hip normal-appearing tender palpation greater trochanter.  Decreased hip abduction strength.    Lab and Radiology Results Hip greater trochanteric injection: left Consent obtained and timeout performed. Area of maximum tenderness palpated and identified. Skin cleaned with alcohol, cold spray applied. A 22g needle was used to access the greater trochanteric bursa. 40 mg of Depo-Medrol and 2 mL of Marcaine were used to inject the trochanteric bursa. Patient tolerated the procedure well.     Assessment and Plan: 64 y.o. female with left hip pain due to trochanteric bursitis.  Not much better after about a month.  Unfortunately she is just not able to attend physical therapy frequently enough with outpatient therapy to get benefit from it.  Plan to refer to home health physical therapy which should be more  effective for her.  Repeat injection today as she is pretty miserable.  If not improved neck step would be MRI.  She will notify me in a few weeks to a month if worsening or not better at that point would do with MRI.  Otherwise recheck 6 weeks.    Orders Placed This Encounter  Procedures  . Ambulatory referral to Home Health    Referral Priority:   Routine    Referral Type:   Home Health Care    Referral Reason:   Specialty Services Required    Requested Specialty:   [place]    Number of Visits Requested:   1   No orders of the defined types were placed in this encounter.    Discussed warning signs or symptoms. Please see discharge instructions. Patient expresses understanding.   The above documentation has been reviewed and is accurate and complete [name], M.D.

## 2020-01-18 NOTE — Patient Instructions (Addendum)
Thank you for coming in today. Plan for home health PT.  Call or go to the ER if you develop a large red swollen joint with extreme pain or oozing puss.  Recheck in 6 weeks or following MRI if we need one.  Let me know if we need an MRI.

## 2020-01-18 NOTE — Consult Note (Signed)
Chief Complaint: Patient was seen in consultation today for a recent angiogram result review.  Referring Physician(s): Dorotea Hand  History of Present Illness: [name] is a 64 y.o. female with a past medical history as below, with pertinent past medical history including history of multiple intracranial aneurysms.  The patient returns today with her son to discuss the findings of the diagnostic angiogram performed on 12/30/2019.Marland Kitchen    Clinically the patient reports a history of chronic headaches for which she has been treated by her primary care.  There has been no change in the character and intensity of these headaches. 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  Again the natural history of unruptured aneurysms was reviewed.  Risk of rupture 1 to 2 %/year per aneurysm with potential significant morbidity and mortality were reviewed.  However angiographically these aneurysms have remained stable with no change in shape or size during follow-up.  Endovascular treatment of the left posterior communicating artery region aneurysm was feasible though carried the potential risk ofthromboembolic or intraprocedural complication.. I the to the posterior fossa aneurysms were also felt to be potentially treatable though probably carried a high risk of complication given the location..  Both the patient and the son fell given the angiograph stability and the risks entailed with the treatment mentioned above, the opted for continued conservative surveillance at this time.  The patient was again advised to stop smoking.. Past Medical History:  Diagnosis Date  . Allergic rhinitis   . Arthritis     . Brain aneurysm   . Chronic abdominal pain   . COPD (chronic obstructive pulmonary disease) ([place])    denies  . Depression    bipolar  . Fibromyalgia   . Gastroparesis   . GERD (gastroesophageal reflux disease)   . H/O hiatal hernia   . History of colonic polyps   . HLD (hyperlipidemia)   . IBS (irritable bowel syndrome)    chronic constipation  . Migraines   . OCD (obsessive compulsive disorder)    anxiety  . Peripheral neuropathy     Past Surgical History:  Procedure Laterality Date  . ABDOMINAL HYSTERECTOMY    . APPENDECTOMY    . CERVICAL DISC SURGERY    . cholecystectomy    . coil to brain aneurysm    . COLONOSCOPY    . ELBOW SURGERY Bilateral    tendonitis  . IR ANGIO INTRA EXTRACRAN SEL COM CAROTID INNOMINATE BILAT MOD SED  07/31/2017  . IR ANGIO INTRA EXTRACRAN SEL COM CAROTID INNOMINATE BILAT MOD SED  12/30/2019  . IR ANGIO VERTEBRAL SEL VERTEBRAL BILAT MOD SED  07/31/2017  . IR ANGIO VERTEBRAL SEL VERTEBRAL BILAT MOD SED  12/30/2019  . NECK SURGERY    . OOPHORECTOMY    . RADIOLOGY WITH ANESTHESIA N/A 05/10/2013   Procedure: RADIOLOGY WITH ANESTHESIA;  Surgeon: Rob Hickman, MD;  Location: [facility];  Service: Radiology;  Laterality: N/A;  . SHOULDER SURGERY Left   . TUBAL LIGATION    . VESICOVAGINAL FISTULA CLOSURE W/ TAH      Allergies: Ambien [zolpidem tartrate], Hydrocodone, Lactose intolerance (gi), Nsaids, Propoxyphene n-acetaminophen, Tramadol, and Chantix [varenicline]  Medications: Prior to Admission medications   Medication  Sig Start Date End Date Taking? Authorizing Provider  aspirin 325 MG tablet Take 325 mg by mouth daily.    [provider]  atorvastatin (LIPITOR) 40 MG tablet TAKE 1 TABLET BY MOUTH  DAILY Patient taking differently: Take 40 mg by mouth at bedtime.  04/06/19   Biagio Borg, MD  buPROPion (WELLBUTRIN XL) 150 MG 24 hr tablet Take 1 tablet (150 mg total) by mouth in the morning. 10/26/19   Adelene Idler, Dorothea Glassman, PA-C   Cholecalciferol (VITAMIN D) 50 MCG (2000 UT) tablet Take 2,000 Units by mouth daily.    [provider]  diclofenac (VOLTAREN) 75 MG EC tablet Take 75 mg by mouth 2 (two) times daily.     [provider]  fexofenadine (ALLEGRA) 180 MG tablet Take 180 mg by mouth daily.    [provider]  gabapentin (NEURONTIN) 800 MG tablet TAKE 1 TABLET 3 TIMES  DAILY THEREAFTER Patient taking differently: Take 800 mg by mouth 3 (three) times daily.  10/04/19   Addison Lank, PA-C  lamoTRIgine (LAMICTAL) 100 MG tablet TAKE 1 TABLET BY MOUTH IN  THE MORNING AND 3 TABLETS  BY MOUTH AT BEDTIME Patient taking differently: Take 100-300 mg by mouth See admin instructions. Take 100 mg in the morning and 300 mg at bedtime 01/19/19   Donnal Moat T, PA-C  meloxicam (MOBIC) 15 MG tablet Take 15 mg by mouth daily.     [provider]  naloxone [facility]) 4 MG/0.1ML LIQD nasal spray kit Place 0.4 mg into the nose as needed (opioid overdose).     [provider]  OLANZapine (ZYPREXA) 10 MG tablet Take 2 tablets (20 mg total) by mouth at bedtime. 09/29/19   Addison Lank, PA-C  omeprazole (PRILOSEC) 40 MG capsule Take 40 mg by mouth 2 (two) times daily.     [provider]  Oxycodone HCl 20 MG TABS Take 1 tablet by mouth every 6 (six) hours as needed (pain).  07/20/16   [provider]  potassium chloride (KLOR-CON) 10 MEQ tablet TAKE 1 TABLET BY MOUTH  DAILY Patient taking differently: Take 10 mEq by mouth daily.  04/06/19   Biagio Borg, MD  promethazine (PHENERGAN) 12.5 MG tablet Take 1 tablet by mouth  every 6 hours as needed for nausea Patient taking differently: Take 12.5 mg by mouth every 6 (six) hours as needed for nausea.  08/01/15   Biagio Borg, MD  risperidone (RISPERDAL) 4 MG tablet TAKE 1 TABLET BY MOUTH IN  THE MORNING AND 1 1/2 TABLETS  BY MOUTH AT BEDTIME Patient taking differently: Take 4-6 mg by mouth See admin instructions. Take 4 mg in the  morning and 6 mg at night 04/28/19   Addison Lank, [place]     Family History  Problem Relation Age of Onset  . Cancer Father        lung  . Cancer Mother        brain  . Coronary artery disease Other        family hx  . Cancer Other        family hx  . Diabetes Other        DM - family hx  . Hypertension Other        family hx  . Seizures Other        family hx  . Cancer Sister        stomach  . Leukemia Sister   . Colon  cancer Neg Hx     Social History   Socioeconomic History  . Marital status: Widowed    Spouse name: Not on file  . Number of children: Not on file  . Years of education: Not on file  . Highest education level: Not on file  Occupational History    Employer: DISABLED  Tobacco Use  . Smoking status: Current Every Day Smoker    Packs/day: 1.25    Years: 26.00    Pack years: 32.50    Types: Cigarettes  . Smokeless tobacco: Never Used  Vaping Use  . Vaping Use: Never used  Substance and Sexual Activity  . Alcohol use: No  . Drug use: No  . Sexual activity: Not Currently  Other Topics Concern  . Not on file  Social History Narrative   Widow. Does not work. Drinks coffee in AM    Social Determinants of Health   Financial Resource Strain:   . Difficulty of Paying Living Expenses:   Food Insecurity:   . Worried About Charity fundraiser in the Last Year:   . Arboriculturist in the Last Year:   Transportation Needs:   . Film/video editor (Medical):   Marland Kitchen Lack of Transportation (Non-Medical):   Physical Activity:   . Days of Exercise per Week:   . Minutes of Exercise per Session:   Stress:   . Feeling of Stress :   Social Connections:   . Frequency of Communication with Friends and Family:   . Frequency of Social Gatherings with Friends and Family:   . Attends Religious Services:   . Active Member of Clubs or Organizations:   . Attends Archivist Meetings:   Marland Kitchen Marital Status:      Review of Systems: A 12 point ROS  discussed and pertinent positives are indicated in the HPI above.  All other systems are negative.  Review of Systems Deferred  Vital Signs: There were no vitals taken for this visit.  Physical Exam Deferred  Imaging: IR ANGIO INTRA EXTRACRAN SEL COM CAROTID INNOMINATE BILAT MOD SED  Result Date: 12/31/2019 CLINICAL DATA:  Persistent headaches. History of multiple intracranial aneurysms. EXAM: BILATERAL COMMON CAROTID AND INNOMINATE ANGIOGRAPHY COMPARISON:  Previous CT angiogram, and arteriograms of July 09, 2010. MEDICATIONS: Heparin 1000 units IV. None antibiotic was administered within 1 hour of the procedure. ANESTHESIA/SEDATION: Versed 0.5 mg IV; Fentanyl 12.5 mcg IV Moderate Sedation Time:  30 minutes The patient was continuously monitored during the procedure by the interventional radiology nurse under my direct supervision. CONTRAST:  Isovue 300 approximately 65 mL FLUOROSCOPY TIME:  Fluoroscopy Time: 7 minutes 42 seconds (256 mGy). COMPLICATIONS: None immediate. TECHNIQUE: Informed written consent was obtained from the patient after a thorough discussion of the procedural risks, benefits and alternatives. All questions were addressed. Maximal Sterile Barrier Technique was utilized including caps, mask, sterile gowns, sterile gloves, sterile drape, hand hygiene and skin antiseptic. A timeout was performed prior to the initiation of the procedure. The right groin was prepped and draped in the usual sterile fashion. Thereafter using modified Seldinger technique, transfemoral access into the right common femoral artery was obtained without difficulty. Over a 0.035 inch guidewire, a 5 French Pinnacle sheath was inserted. Through this, and also over 0.035 inch guidewire, a 5 [place] JB 1 catheter was advanced to the aortic arch region and selectively positioned in the right common carotid artery, The right vertebral artery, the left common carotid artery and the left vertebral artery.  FINDINGS:  The left vertebral artery origin is widely patent. The vessel is seen to opacify to the cranial skull base. Wide patency is seen of the left vertebrobasilar junction and the left posterior-inferior cerebellar artery. The opacified portion of the basilar artery, the right posterior cerebral artery, the left posterior cerebral artery and the superior cerebral arteries demonstrate opacification into the capillary and venous phases. Seen arising from the proximal superior aspect of the right superior cerebral artery is also a wide neck saccular aneurysm measuring approximately 3.1 mm x 3.1 mm. Similarly there is an approximately 2.2 mm x 1.7 mm saccular aneurysm arising from the proximal aspect of the left posterior cerebral artery. Unopacified blood is seen in the basilar artery from the contralateral vertebral artery. The left common carotid arteriogram demonstrates the left external carotid artery and its major branches to be widely patent. The left internal carotid artery at the bulb to the cranial skull base also demonstrates wide patency. The petrous, cavernous and the supraclinoid segments are widely patent. Arising in the left posterior communicating artery region is a wide neck approximately 3 mm x 2.5 mm saccular aneurysm. Distal to this the left middle cerebral artery and the left anterior cerebral artery opacify into the capillary and venous phases. The right vertebral artery origin is widely patent. The vessel opacifies to the cranial skull base. Patency is seen of the right posterior-inferior cerebellar artery and the right vertebrobasilar junction. The opacified portions of the basilar artery, the posterior cerebral arteries, the superior cerebellar arteries and the anterior-inferior cerebellar arteries demonstrate patency into the capillary and venous phases. Again demonstrated are the right superior cerebellar artery, and the left posterior cerebral artery proximal aneurysms described above. The right  common carotid arteriogram demonstrates the right external carotid artery to be mildly narrowed at its origin. Its branches opacify widely. The right internal carotid artery at the bulb to the cranial skull base is also widely patent. Again demonstrated in the junction of the distal and the middle 1/3 of the cervical right ICA are 3 focal outpouchings the largest measuring approximately 3 mm x 3 mm. More distally the right internal carotid artery is widely patent. The petrous, cavernous and the supraclinoid segments are widely patent. The right middle cerebral artery and the right anterior cerebral artery opacify into the capillary and venous phases. Demonstrated is a small approximately less than 1 mm outpouching along the inferior aspect of the distal right middle cerebral artery/proximal inferior division of the right middle cerebral artery outpouching. IMPRESSION: Approximately 2.2 mm x 1.7 mm aneurysm arising from the proximal left posterior cerebral artery. Approximately 3.1 mm x 3.1 mm saccular aneurysm arising from the proximal right superior cerebellar artery. Approximately 2.5 mm x 3 mm wide neck saccular aneurysm arising from the left internal carotid artery at the level of the posterior communicating artery. Small less than 1 mm outpouching arising at the distal end of the right middle cerebral artery/proximal inferior division of the right middle cerebral artery. All these findings are stable. PLAN: In view the patient's symptoms, the patient has been asked to make an appointment for further discussion regarding the potential management of the aneurysms discussed. The patient expressed understanding and agreement with the above management plan. Electronically Signed   By: Luanne Bras M.D.   On: 12/30/2019 15:55   IR ANGIO VERTEBRAL SEL VERTEBRAL BILAT MOD SED  Result Date: 12/31/2019 CLINICAL DATA:  Persistent headaches. History of multiple intracranial aneurysms. EXAM: BILATERAL COMMON  CAROTID AND INNOMINATE ANGIOGRAPHY  COMPARISON:  Previous CT angiogram, and arteriograms of July 09, 2010. MEDICATIONS: Heparin 1000 units IV. None antibiotic was administered within 1 hour of the procedure. ANESTHESIA/SEDATION: Versed 0.5 mg IV; Fentanyl 12.5 mcg IV Moderate Sedation Time:  30 minutes The patient was continuously monitored during the procedure by the interventional radiology nurse under my direct supervision. CONTRAST:  Isovue 300 approximately 65 mL FLUOROSCOPY TIME:  Fluoroscopy Time: 7 minutes 42 seconds (256 mGy). COMPLICATIONS: None immediate. TECHNIQUE: Informed written consent was obtained from the patient after a thorough discussion of the procedural risks, benefits and alternatives. All questions were addressed. Maximal Sterile Barrier Technique was utilized including caps, mask, sterile gowns, sterile gloves, sterile drape, hand hygiene and skin antiseptic. A timeout was performed prior to the initiation of the procedure. The right groin was prepped and draped in the usual sterile fashion. Thereafter using modified Seldinger technique, transfemoral access into the right common femoral artery was obtained without difficulty. Over a 0.035 inch guidewire, a 5 French Pinnacle sheath was inserted. Through this, and also over 0.035 inch guidewire, a 5 [place] JB 1 catheter was advanced to the aortic arch region and selectively positioned in the right common carotid artery, The right vertebral artery, the left common carotid artery and the left vertebral artery. FINDINGS: The left vertebral artery origin is widely patent. The vessel is seen to opacify to the cranial skull base. Wide patency is seen of the left vertebrobasilar junction and the left posterior-inferior cerebellar artery. The opacified portion of the basilar artery, the right posterior cerebral artery, the left posterior cerebral artery and the superior cerebral arteries demonstrate opacification into the capillary and venous  phases. Seen arising from the proximal superior aspect of the right superior cerebral artery is also a wide neck saccular aneurysm measuring approximately 3.1 mm x 3.1 mm. Similarly there is an approximately 2.2 mm x 1.7 mm saccular aneurysm arising from the proximal aspect of the left posterior cerebral artery. Unopacified blood is seen in the basilar artery from the contralateral vertebral artery. The left common carotid arteriogram demonstrates the left external carotid artery and its major branches to be widely patent. The left internal carotid artery at the bulb to the cranial skull base also demonstrates wide patency. The petrous, cavernous and the supraclinoid segments are widely patent. Arising in the left posterior communicating artery region is a wide neck approximately 3 mm x 2.5 mm saccular aneurysm. Distal to this the left middle cerebral artery and the left anterior cerebral artery opacify into the capillary and venous phases. The right vertebral artery origin is widely patent. The vessel opacifies to the cranial skull base. Patency is seen of the right posterior-inferior cerebellar artery and the right vertebrobasilar junction. The opacified portions of the basilar artery, the posterior cerebral arteries, the superior cerebellar arteries and the anterior-inferior cerebellar arteries demonstrate patency into the capillary and venous phases. Again demonstrated are the right superior cerebellar artery, and the left posterior cerebral artery proximal aneurysms described above. The right common carotid arteriogram demonstrates the right external carotid artery to be mildly narrowed at its origin. Its branches opacify widely. The right internal carotid artery at the bulb to the cranial skull base is also widely patent. Again demonstrated in the junction of the distal and the middle 1/3 of the cervical right ICA are 3 focal outpouchings the largest measuring approximately 3 mm x 3 mm. More distally the right  internal carotid artery is widely patent. The petrous, cavernous and the supraclinoid segments are widely  patent. The right middle cerebral artery and the right anterior cerebral artery opacify into the capillary and venous phases. Demonstrated is a small approximately less than 1 mm outpouching along the inferior aspect of the distal right middle cerebral artery/proximal inferior division of the right middle cerebral artery outpouching. IMPRESSION: Approximately 2.2 mm x 1.7 mm aneurysm arising from the proximal left posterior cerebral artery. Approximately 3.1 mm x 3.1 mm saccular aneurysm arising from the proximal right superior cerebellar artery. Approximately 2.5 mm x 3 mm wide neck saccular aneurysm arising from the left internal carotid artery at the level of the posterior communicating artery. Small less than 1 mm outpouching arising at the distal end of the right middle cerebral artery/proximal inferior division of the right middle cerebral artery. All these findings are stable. PLAN: In view the patient's symptoms, the patient has been asked to make an appointment for further discussion regarding the potential management of the aneurysms discussed. The patient expressed understanding and agreement with the above management plan. Electronically Signed   By: Luanne Bras M.D.   On: 12/30/2019 15:55    Labs:  CBC: Recent Labs    07/15/19 1201 12/30/19 0645  WBC 11.5* 7.4  HGB 14.8 15.1*  HCT 44.9 47.9*  PLT 220.0 258    COAGS: Recent Labs    12/30/19 0645  INR 0.9    BMP: Recent Labs    07/15/19 1201 12/30/19 0645  NA 138 140  K 4.1 3.6  CL 102 101  CO2 28 29  GLUCOSE 107* 111*  BUN 7 9  CALCIUM 9.4 10.1  CREATININE 0.86 1.02*  GFRNONAA  --  59*  GFRAA  --  >60    LIVER FUNCTION TESTS: Recent Labs    07/15/19 1201  BILITOT 0.4  AST 13  ALT 11  ALKPHOS 62  PROT 5.8*  ALBUMIN 3.7    TUMOR MARKERS: No results for input(s): AFPTM, CEA, CA199, CHROMGRNA in  the last 8760 hours.  Assessment and Plan: Plan for follow-up CTA of the head and neck in a years time, or sooner should the patient's symptoms change especially worsening headaches informed patient that our schedulers will call nearer  the time of follow-up. All questions answered and concerns addressed. Patient conveys understanding and agrees with plan.  Thank you for this interesting consult.  I greatly enjoyed meeting [name] and look forward to participating in their care.  A copy of this report was sent to the requesting provider on this date.  Electronically Signed: Rob Hickman, MD 01/18/2020, 11:45 AM   I spent a total of 20 minutes risk-management in face to face in clinical consultation, greater than 50% of which was counseling/coordinating care for risk management regarding unruptured aneurysms.

## 2020-01-27 ENCOUNTER — Ambulatory Visit: Payer: Medicare Other | Admitting: Physician Assistant

## 2020-02-01 DIAGNOSIS — K640 First degree hemorrhoids: Secondary | ICD-10-CM | POA: Diagnosis not present

## 2020-02-01 DIAGNOSIS — Z1211 Encounter for screening for malignant neoplasm of colon: Secondary | ICD-10-CM | POA: Diagnosis not present

## 2020-02-01 DIAGNOSIS — Z86010 Personal history of colonic polyps: Secondary | ICD-10-CM | POA: Diagnosis not present

## 2020-02-02 DIAGNOSIS — E785 Hyperlipidemia, unspecified: Secondary | ICD-10-CM | POA: Diagnosis not present

## 2020-02-02 DIAGNOSIS — Z7982 Long term (current) use of aspirin: Secondary | ICD-10-CM | POA: Diagnosis not present

## 2020-02-02 DIAGNOSIS — Z9181 History of falling: Secondary | ICD-10-CM | POA: Diagnosis not present

## 2020-02-02 DIAGNOSIS — G8929 Other chronic pain: Secondary | ICD-10-CM | POA: Diagnosis not present

## 2020-02-02 DIAGNOSIS — J449 Chronic obstructive pulmonary disease, unspecified: Secondary | ICD-10-CM | POA: Diagnosis not present

## 2020-02-02 DIAGNOSIS — M7062 Trochanteric bursitis, left hip: Secondary | ICD-10-CM | POA: Diagnosis not present

## 2020-02-02 DIAGNOSIS — M797 Fibromyalgia: Secondary | ICD-10-CM | POA: Diagnosis not present

## 2020-02-05 ENCOUNTER — Other Ambulatory Visit: Payer: Self-pay | Admitting: Physician Assistant

## 2020-02-05 ENCOUNTER — Other Ambulatory Visit: Payer: Self-pay | Admitting: Internal Medicine

## 2020-02-05 NOTE — Telephone Encounter (Signed)
Please refill as per office routine med refill policy (all routine meds refilled for 3 mo or monthly per pt preference up to one year from last visit, then month to month grace period for 3 mo, then further med refills will have to be denied)  

## 2020-02-08 DIAGNOSIS — Z9181 History of falling: Secondary | ICD-10-CM | POA: Diagnosis not present

## 2020-02-08 DIAGNOSIS — J449 Chronic obstructive pulmonary disease, unspecified: Secondary | ICD-10-CM | POA: Diagnosis not present

## 2020-02-08 DIAGNOSIS — M797 Fibromyalgia: Secondary | ICD-10-CM | POA: Diagnosis not present

## 2020-02-08 DIAGNOSIS — Z7982 Long term (current) use of aspirin: Secondary | ICD-10-CM | POA: Diagnosis not present

## 2020-02-08 DIAGNOSIS — E785 Hyperlipidemia, unspecified: Secondary | ICD-10-CM | POA: Diagnosis not present

## 2020-02-08 DIAGNOSIS — G8929 Other chronic pain: Secondary | ICD-10-CM | POA: Diagnosis not present

## 2020-02-08 DIAGNOSIS — M7062 Trochanteric bursitis, left hip: Secondary | ICD-10-CM | POA: Diagnosis not present

## 2020-02-08 NOTE — Telephone Encounter (Signed)
Next apt 08/30, last seen 3 months ago. Request for 1 year?

## 2020-02-09 ENCOUNTER — Telehealth: Payer: Self-pay | Admitting: Family Medicine

## 2020-02-09 NOTE — Telephone Encounter (Signed)
Genevieve from [facility] called asking for the frequency that the patient can take over the counter Naproxen 220mg ?  Please advise.  Call back # [phone]

## 2020-02-09 NOTE — Telephone Encounter (Signed)
Called and left message at provided number w/ Naproxen frequency of twice daily for [facility].

## 2020-02-09 NOTE — Telephone Encounter (Signed)
Informed patient

## 2020-02-09 NOTE — Telephone Encounter (Signed)
Can take naproxen twice daily as needed for pain

## 2020-02-10 DIAGNOSIS — E785 Hyperlipidemia, unspecified: Secondary | ICD-10-CM | POA: Diagnosis not present

## 2020-02-10 DIAGNOSIS — M7062 Trochanteric bursitis, left hip: Secondary | ICD-10-CM | POA: Diagnosis not present

## 2020-02-10 DIAGNOSIS — M797 Fibromyalgia: Secondary | ICD-10-CM | POA: Diagnosis not present

## 2020-02-10 DIAGNOSIS — Z9181 History of falling: Secondary | ICD-10-CM | POA: Diagnosis not present

## 2020-02-10 DIAGNOSIS — J449 Chronic obstructive pulmonary disease, unspecified: Secondary | ICD-10-CM | POA: Diagnosis not present

## 2020-02-10 DIAGNOSIS — Z7982 Long term (current) use of aspirin: Secondary | ICD-10-CM | POA: Diagnosis not present

## 2020-02-10 DIAGNOSIS — G8929 Other chronic pain: Secondary | ICD-10-CM | POA: Diagnosis not present

## 2020-02-11 ENCOUNTER — Telehealth: Payer: Self-pay | Admitting: Physician Assistant

## 2020-02-11 NOTE — Telephone Encounter (Signed)
Pt called requesting name of Neurologist you referred her to. She don't remember.  contact @ [phone]

## 2020-02-14 ENCOUNTER — Ambulatory Visit: Payer: Medicare Other | Admitting: Physician Assistant

## 2020-02-14 NOTE — Telephone Encounter (Signed)
Noted thank you

## 2020-02-14 NOTE — Telephone Encounter (Signed)
Pt. Made aware.

## 2020-02-14 NOTE — Telephone Encounter (Signed)
[name], can you let her know the last referral on 10/13/19 was to [facility] if that's what she's referring to.

## 2020-02-15 DIAGNOSIS — Z7982 Long term (current) use of aspirin: Secondary | ICD-10-CM | POA: Diagnosis not present

## 2020-02-15 DIAGNOSIS — E785 Hyperlipidemia, unspecified: Secondary | ICD-10-CM | POA: Diagnosis not present

## 2020-02-15 DIAGNOSIS — Z9181 History of falling: Secondary | ICD-10-CM | POA: Diagnosis not present

## 2020-02-15 DIAGNOSIS — G8929 Other chronic pain: Secondary | ICD-10-CM | POA: Diagnosis not present

## 2020-02-15 DIAGNOSIS — J449 Chronic obstructive pulmonary disease, unspecified: Secondary | ICD-10-CM | POA: Diagnosis not present

## 2020-02-15 DIAGNOSIS — M797 Fibromyalgia: Secondary | ICD-10-CM | POA: Diagnosis not present

## 2020-02-15 DIAGNOSIS — M7062 Trochanteric bursitis, left hip: Secondary | ICD-10-CM | POA: Diagnosis not present

## 2020-02-17 ENCOUNTER — Encounter: Payer: Self-pay | Admitting: Internal Medicine

## 2020-02-17 ENCOUNTER — Ambulatory Visit (INDEPENDENT_AMBULATORY_CARE_PROVIDER_SITE_OTHER): Payer: Medicare Other | Admitting: Internal Medicine

## 2020-02-17 ENCOUNTER — Other Ambulatory Visit: Payer: Self-pay

## 2020-02-17 VITALS — BP 90/68 | HR 106 | Temp 98.4°F | Ht 65.0 in | Wt 143.0 lb

## 2020-02-17 DIAGNOSIS — M7062 Trochanteric bursitis, left hip: Secondary | ICD-10-CM | POA: Diagnosis not present

## 2020-02-17 DIAGNOSIS — R269 Unspecified abnormalities of gait and mobility: Secondary | ICD-10-CM | POA: Diagnosis not present

## 2020-02-17 DIAGNOSIS — J438 Other emphysema: Secondary | ICD-10-CM | POA: Diagnosis not present

## 2020-02-17 DIAGNOSIS — R739 Hyperglycemia, unspecified: Secondary | ICD-10-CM

## 2020-02-17 DIAGNOSIS — M545 Low back pain, unspecified: Secondary | ICD-10-CM

## 2020-02-17 DIAGNOSIS — G8929 Other chronic pain: Secondary | ICD-10-CM

## 2020-02-17 DIAGNOSIS — E785 Hyperlipidemia, unspecified: Secondary | ICD-10-CM | POA: Diagnosis not present

## 2020-02-17 DIAGNOSIS — J449 Chronic obstructive pulmonary disease, unspecified: Secondary | ICD-10-CM | POA: Diagnosis not present

## 2020-02-17 DIAGNOSIS — Z9181 History of falling: Secondary | ICD-10-CM | POA: Diagnosis not present

## 2020-02-17 DIAGNOSIS — Z7982 Long term (current) use of aspirin: Secondary | ICD-10-CM | POA: Diagnosis not present

## 2020-02-17 DIAGNOSIS — M797 Fibromyalgia: Secondary | ICD-10-CM | POA: Diagnosis not present

## 2020-02-17 DIAGNOSIS — F17210 Nicotine dependence, cigarettes, uncomplicated: Secondary | ICD-10-CM | POA: Diagnosis not present

## 2020-02-17 NOTE — Assessment & Plan Note (Addendum)
stable overall by history and exam, recent data reviewed with pt, and pt to continue medical treatment as before,  to f/u any worsening symptoms or concerns, for [facility], RN, PT, aide  I spent 31 minutes in addition to time for CPX wellness examination in preparing to see the patient by review of recent labs, imaging and procedures, obtaining and reviewing separately obtained history, communicating with the patient and family or caregiver, ordering medications, tests or procedures, and documenting clinical information in the EHR including the differential Dx, treatment, and any further evaluation and other management of copd, hyperglyemia, lbp, gait d/o

## 2020-02-17 NOTE — Assessment & Plan Note (Signed)
stable overall by history and exam, recent data reviewed with pt, and pt to continue medical treatment as before,  to f/u any worsening symptoms or concerns, cont pT

## 2020-02-17 NOTE — Assessment & Plan Note (Signed)
stable overall by history and exam, recent data reviewed with pt, and pt to continue medical treatment as before,  to f/u any worsening symptoms or concerns  

## 2020-02-17 NOTE — Assessment & Plan Note (Signed)
Cont pT

## 2020-02-17 NOTE — Progress Notes (Signed)
Subjective:    Patient ID: [name], female    DOB: 1955-09-05, 64 y.o.   MRN: [id number]  HPI  Here to f/u; overall doing ok,  Pt denies chest pain, increasing sob or doe, wheezing, orthopnea, PND, increased LE swelling, palpitations, dizziness or syncope.  Pt denies new neurological symptoms such as new headache, or facial or extremity weakness or numbness.  Pt denies polydipsia, polyuria, or low sugar episode.  Pt states overall good compliance with meds, asks for assistance with HH, already getting home PT. Walks with cane, falling about 1 time per wk.   Pt denies fever, wt loss, night sweats, loss of appetite, or other constitutional symptoms  Pt continues to have recurring LBP without change in severity, bowel or bladder change, fever, wt loss,  worsening LE pain/numbness/weakness, Past Medical History:  Diagnosis Date  . Allergic rhinitis   . Arthritis   . Brain aneurysm   . Chronic abdominal pain   . COPD (chronic obstructive pulmonary disease) ([place])    denies  . Depression    bipolar  . Fibromyalgia   . Gastroparesis   . GERD (gastroesophageal reflux disease)   . H/O hiatal hernia   . History of colonic polyps   . HLD (hyperlipidemia)   . IBS (irritable bowel syndrome)    chronic constipation  . Migraines   . OCD (obsessive compulsive disorder)    anxiety  . Peripheral neuropathy    Past Surgical History:  Procedure Laterality Date  . ABDOMINAL HYSTERECTOMY    . APPENDECTOMY    . CERVICAL DISC SURGERY    . cholecystectomy    . coil to brain aneurysm    . COLONOSCOPY    . ELBOW SURGERY Bilateral    tendonitis  . IR ANGIO INTRA EXTRACRAN SEL COM CAROTID INNOMINATE BILAT MOD SED  07/31/2017  . IR ANGIO INTRA EXTRACRAN SEL COM CAROTID INNOMINATE BILAT MOD SED  12/30/2019  . IR ANGIO VERTEBRAL SEL VERTEBRAL BILAT MOD SED  07/31/2017  . IR ANGIO VERTEBRAL SEL VERTEBRAL BILAT MOD SED  12/30/2019  . NECK SURGERY    . OOPHORECTOMY    . RADIOLOGY WITH ANESTHESIA N/A  05/10/2013   Procedure: RADIOLOGY WITH ANESTHESIA;  Surgeon: Rob Hickman, MD;  Location: [facility];  Service: Radiology;  Laterality: N/A;  . SHOULDER SURGERY Left   . TUBAL LIGATION    . VESICOVAGINAL FISTULA CLOSURE W/ TAH      reports that she has been smoking cigarettes. She has a 32.50 pack-year smoking history. She has never used smokeless tobacco. She reports that she does not drink alcohol and does not use drugs. family history includes Cancer in her father, mother, sister, and another family member; Coronary artery disease in an other family member; Diabetes in an other family member; Hypertension in an other family member; Leukemia in her sister; Seizures in an other family member. Allergies  Allergen Reactions  . Ambien [Zolpidem Tartrate] Other (See Comments)    Memory issues  . Hydrocodone Nausea Only    Can take it if its in a cough syrup   . Lactose Intolerance (Gi)     Bloating, upset stomach   . Nsaids     GI irritation, pt tolerates meloxicam and diclofenac   . Propoxyphene N-Acetaminophen Itching  . Tramadol Nausea And Vomiting  . Chantix [Varenicline] Other (See Comments)    "Makes bipolar worse" agitation, depression, sick to stomach   Current Outpatient Medications on File Prior to Visit  Medication Sig Dispense Refill  . aspirin 325 MG tablet Take 325 mg by mouth daily.    Marland Kitchen atorvastatin (LIPITOR) 40 MG tablet TAKE 1 TABLET BY MOUTH  DAILY 90 tablet 3  . buPROPion (WELLBUTRIN XL) 150 MG 24 hr tablet Take 1 tablet (150 mg total) by mouth in the morning. 90 tablet 1  . Cholecalciferol (VITAMIN D) 50 MCG (2000 UT) tablet Take 2,000 Units by mouth daily.    . diclofenac (VOLTAREN) 75 MG EC tablet Take 75 mg by mouth 2 (two) times daily.     . fexofenadine (ALLEGRA) 180 MG tablet Take 180 mg by mouth daily.    Marland Kitchen gabapentin (NEURONTIN) 800 MG tablet TAKE 1 TABLET 3 TIMES  DAILY THEREAFTER (Patient taking differently: Take 800 mg by mouth 3 (three) times daily. )  270 tablet 3  . lamoTRIgine (LAMICTAL) 100 MG tablet TAKE 1 TABLET BY MOUTH IN  THE MORNING AND 3 TABLETS  AT BEDTIME 360 tablet 1  . meloxicam (MOBIC) 15 MG tablet Take 15 mg by mouth daily.     . naloxone (NARCAN) 4 MG/0.1ML LIQD nasal spray kit Place 0.4 mg into the nose as needed (opioid overdose).     Marland Kitchen OLANZapine (ZYPREXA) 10 MG tablet Take 2 tablets (20 mg total) by mouth at bedtime. 180 tablet 1  . omeprazole (PRILOSEC) 40 MG capsule Take 40 mg by mouth 2 (two) times daily.     . Oxycodone HCl 20 MG TABS Take 1 tablet by mouth every 6 (six) hours as needed (pain).     . potassium chloride (KLOR-CON) 10 MEQ tablet TAKE 1 TABLET BY MOUTH  DAILY 90 tablet 3  . promethazine (PHENERGAN) 12.5 MG tablet Take 1 tablet by mouth  every 6 hours as needed for nausea (Patient taking differently: Take 12.5 mg by mouth every 6 (six) hours as needed for nausea. ) 90 tablet 0  . risperidone (RISPERDAL) 4 MG tablet Take 1 tablet (4 mg total) by mouth See admin instructions. Take 4 mg in the morning and 6 mg at night 225 tablet 1   No current facility-administered medications on file prior to visit.   Review of Systems All otherwise neg per pt    Objective:   Physical Exam BP 90/68 (BP Location: Left Arm, Patient Position: Sitting, Cuff Size: Large)   Pulse (!) 106   Temp 98.4 F (36.9 C) (Oral)   Ht 5' 5"  (1.651 m)   Wt 143 lb (64.9 kg)   SpO2 96%   BMI 23.80 kg/m  VS noted,  Constitutional: Pt appears in NAD HENT: Head: NCAT.  Right Ear: External ear normal.  Left Ear: External ear normal.  Eyes: . Pupils are equal, round, and reactive to light. Conjunctivae and EOM are normal Nose: without d/c or deformity Neck: Neck supple. Gross normal ROM Cardiovascular: Normal rate and regular rhythm.   Pulmonary/Chest: Effort normal and breath sounds decreased without rales or wheezing.  Abd:  Soft, NT, ND, + BS, no organomegaly Neurological: Pt is alert. At baseline orientation, motor grossly  intact Skin: Skin is warm. No rashes, other new lesions, no LE edema Psychiatric: Pt behavior is normal without agitation  All otherwise neg per pt Lab Results  Component Value Date   WBC 7.4 12/30/2019   HGB 15.1 (H) 12/30/2019   HCT 47.9 (H) 12/30/2019   PLT 258 12/30/2019   GLUCOSE 111 (H) 12/30/2019   CHOL 112 07/15/2019   TRIG 55.0 07/15/2019   HDL 49.10  07/15/2019   LDLDIRECT 102.7 08/21/2011   LDLCALC 52 07/15/2019   ALT 11 07/15/2019   AST 13 07/15/2019   NA 140 12/30/2019   K 3.6 12/30/2019   CL 101 12/30/2019   CREATININE 1.02 (H) 12/30/2019   BUN 9 12/30/2019   CO2 29 12/30/2019   TSH 1.35 07/15/2019   INR 0.9 12/30/2019   HGBA1C 5.3 07/15/2019      Assessment & Plan:

## 2020-02-17 NOTE — Patient Instructions (Signed)
Please continue all other medications as before, and refills have been done if requested.  Please have the pharmacy call with any other refills you may need.  Please continue your efforts at being more active, low cholesterol diet, and weight control  Please keep your appointments with your specialists as you may have planned  You will be contacted regarding the referral for: [place] with RN, PT and aide

## 2020-02-19 DIAGNOSIS — Z7982 Long term (current) use of aspirin: Secondary | ICD-10-CM | POA: Diagnosis not present

## 2020-02-19 DIAGNOSIS — J449 Chronic obstructive pulmonary disease, unspecified: Secondary | ICD-10-CM | POA: Diagnosis not present

## 2020-02-19 DIAGNOSIS — E7800 Pure hypercholesterolemia, unspecified: Secondary | ICD-10-CM | POA: Diagnosis not present

## 2020-02-19 DIAGNOSIS — Z8669 Personal history of other diseases of the nervous system and sense organs: Secondary | ICD-10-CM | POA: Diagnosis not present

## 2020-02-19 DIAGNOSIS — Z888 Allergy status to other drugs, medicaments and biological substances status: Secondary | ICD-10-CM | POA: Diagnosis not present

## 2020-02-19 DIAGNOSIS — Z7951 Long term (current) use of inhaled steroids: Secondary | ICD-10-CM | POA: Diagnosis not present

## 2020-02-19 DIAGNOSIS — K219 Gastro-esophageal reflux disease without esophagitis: Secondary | ICD-10-CM | POA: Diagnosis not present

## 2020-02-19 DIAGNOSIS — Z86010 Personal history of colonic polyps: Secondary | ICD-10-CM | POA: Diagnosis not present

## 2020-02-19 DIAGNOSIS — Z79899 Other long term (current) drug therapy: Secondary | ICD-10-CM | POA: Diagnosis not present

## 2020-02-19 DIAGNOSIS — F17210 Nicotine dependence, cigarettes, uncomplicated: Secondary | ICD-10-CM | POA: Diagnosis not present

## 2020-02-19 DIAGNOSIS — Z043 Encounter for examination and observation following other accident: Secondary | ICD-10-CM | POA: Diagnosis not present

## 2020-02-19 DIAGNOSIS — Z885 Allergy status to narcotic agent status: Secondary | ICD-10-CM | POA: Diagnosis not present

## 2020-02-19 DIAGNOSIS — G8911 Acute pain due to trauma: Secondary | ICD-10-CM | POA: Diagnosis not present

## 2020-02-19 DIAGNOSIS — K589 Irritable bowel syndrome without diarrhea: Secondary | ICD-10-CM | POA: Diagnosis not present

## 2020-02-19 DIAGNOSIS — S7002XA Contusion of left hip, initial encounter: Secondary | ICD-10-CM | POA: Diagnosis not present

## 2020-02-22 DIAGNOSIS — E785 Hyperlipidemia, unspecified: Secondary | ICD-10-CM | POA: Diagnosis not present

## 2020-02-22 DIAGNOSIS — J449 Chronic obstructive pulmonary disease, unspecified: Secondary | ICD-10-CM | POA: Diagnosis not present

## 2020-02-22 DIAGNOSIS — Z7982 Long term (current) use of aspirin: Secondary | ICD-10-CM | POA: Diagnosis not present

## 2020-02-22 DIAGNOSIS — G8929 Other chronic pain: Secondary | ICD-10-CM | POA: Diagnosis not present

## 2020-02-22 DIAGNOSIS — M797 Fibromyalgia: Secondary | ICD-10-CM | POA: Diagnosis not present

## 2020-02-22 DIAGNOSIS — Z9181 History of falling: Secondary | ICD-10-CM | POA: Diagnosis not present

## 2020-02-22 DIAGNOSIS — F17210 Nicotine dependence, cigarettes, uncomplicated: Secondary | ICD-10-CM | POA: Diagnosis not present

## 2020-02-22 DIAGNOSIS — M7062 Trochanteric bursitis, left hip: Secondary | ICD-10-CM | POA: Diagnosis not present

## 2020-02-24 DIAGNOSIS — E785 Hyperlipidemia, unspecified: Secondary | ICD-10-CM | POA: Diagnosis not present

## 2020-02-24 DIAGNOSIS — Z7982 Long term (current) use of aspirin: Secondary | ICD-10-CM | POA: Diagnosis not present

## 2020-02-24 DIAGNOSIS — G8929 Other chronic pain: Secondary | ICD-10-CM | POA: Diagnosis not present

## 2020-02-24 DIAGNOSIS — J449 Chronic obstructive pulmonary disease, unspecified: Secondary | ICD-10-CM | POA: Diagnosis not present

## 2020-02-24 DIAGNOSIS — M797 Fibromyalgia: Secondary | ICD-10-CM | POA: Diagnosis not present

## 2020-02-24 DIAGNOSIS — Z9181 History of falling: Secondary | ICD-10-CM | POA: Diagnosis not present

## 2020-02-24 DIAGNOSIS — M7062 Trochanteric bursitis, left hip: Secondary | ICD-10-CM | POA: Diagnosis not present

## 2020-02-24 DIAGNOSIS — F17210 Nicotine dependence, cigarettes, uncomplicated: Secondary | ICD-10-CM | POA: Diagnosis not present

## 2020-02-28 ENCOUNTER — Other Ambulatory Visit: Payer: Self-pay | Admitting: Physician Assistant

## 2020-02-28 NOTE — Telephone Encounter (Signed)
Please review

## 2020-02-29 DIAGNOSIS — M797 Fibromyalgia: Secondary | ICD-10-CM | POA: Diagnosis not present

## 2020-02-29 DIAGNOSIS — M7062 Trochanteric bursitis, left hip: Secondary | ICD-10-CM | POA: Diagnosis not present

## 2020-02-29 DIAGNOSIS — F17210 Nicotine dependence, cigarettes, uncomplicated: Secondary | ICD-10-CM | POA: Diagnosis not present

## 2020-02-29 DIAGNOSIS — J449 Chronic obstructive pulmonary disease, unspecified: Secondary | ICD-10-CM | POA: Diagnosis not present

## 2020-02-29 DIAGNOSIS — E785 Hyperlipidemia, unspecified: Secondary | ICD-10-CM | POA: Diagnosis not present

## 2020-02-29 DIAGNOSIS — Z7982 Long term (current) use of aspirin: Secondary | ICD-10-CM | POA: Diagnosis not present

## 2020-02-29 DIAGNOSIS — G8929 Other chronic pain: Secondary | ICD-10-CM | POA: Diagnosis not present

## 2020-02-29 DIAGNOSIS — Z9181 History of falling: Secondary | ICD-10-CM | POA: Diagnosis not present

## 2020-03-02 DIAGNOSIS — M797 Fibromyalgia: Secondary | ICD-10-CM | POA: Diagnosis not present

## 2020-03-02 DIAGNOSIS — M7062 Trochanteric bursitis, left hip: Secondary | ICD-10-CM | POA: Diagnosis not present

## 2020-03-02 DIAGNOSIS — G8929 Other chronic pain: Secondary | ICD-10-CM | POA: Diagnosis not present

## 2020-03-02 DIAGNOSIS — Z9181 History of falling: Secondary | ICD-10-CM | POA: Diagnosis not present

## 2020-03-02 DIAGNOSIS — Z7982 Long term (current) use of aspirin: Secondary | ICD-10-CM | POA: Diagnosis not present

## 2020-03-02 DIAGNOSIS — J449 Chronic obstructive pulmonary disease, unspecified: Secondary | ICD-10-CM | POA: Diagnosis not present

## 2020-03-02 DIAGNOSIS — E785 Hyperlipidemia, unspecified: Secondary | ICD-10-CM | POA: Diagnosis not present

## 2020-03-02 DIAGNOSIS — F17210 Nicotine dependence, cigarettes, uncomplicated: Secondary | ICD-10-CM | POA: Diagnosis not present

## 2020-03-03 ENCOUNTER — Other Ambulatory Visit: Payer: Self-pay

## 2020-03-03 ENCOUNTER — Ambulatory Visit (INDEPENDENT_AMBULATORY_CARE_PROVIDER_SITE_OTHER): Payer: Medicare Other | Admitting: Family Medicine

## 2020-03-03 ENCOUNTER — Encounter: Payer: Self-pay | Admitting: Family Medicine

## 2020-03-03 VITALS — BP 110/70 | HR 107 | Ht 65.0 in | Wt 145.0 lb

## 2020-03-03 DIAGNOSIS — M25552 Pain in left hip: Secondary | ICD-10-CM

## 2020-03-03 NOTE — Patient Instructions (Addendum)
MRI Left Hip at [facility] will call you  You should hear from MRI scheduling within 1 week. If you do not hear please let me know.   Recheck after MRI.   The phone number to [facility] is [phone].

## 2020-03-03 NOTE — Progress Notes (Signed)
   Fontaine No, am serving as a scribe for Dr. [name]. This visit occurred during the SARS-CoV-2 public health emergency.  Safety protocols were in place, including screening questions prior to the visit, additional usage of staff PPE, and extensive cleaning of exam room while observing appropriate contact time as indicated for disinfecting solutions.   [name] is a 64 y.o. female who presents to [place] at [facility] today for f/u of L hip pain.  She was last seen by Dr. [name] on 01/18/20 and was referred to home health PT due to having difficulty w/ transportation to/from outpatient PT.  She had a prior L GT injection on 11/19/19 that helped w/ her pain for approximately 2.5 weeks.  Since then, the pt tripped and fell and landed on her L hip on 02/18/20.  She was seen at the [facility] ED in [place] on 02/19/20 and had a L hip/pelvis XR.  Since then, pt reports that she fell backwards. Constant, 8/10 pain over greater trochanter. Pain increases with weight bearing. Using IBU, naproxen and doing exercises with physical therapist.      Pertinent review of systems: No fevers or chills  Relevant historical information: COPD.  Tremor.   Exam:  BP 110/70   Pulse (!) 107   Ht 5\' 5"  (1.651 m)   Wt 145 lb (65.8 kg)   SpO2 95%   BMI 24.13 kg/m  General: Well Developed, well nourished, and in no acute distress.   MSK: Left hip normal-appearing tender palpation greater trochanter.  Normal hip motion.    Lab and Radiology Results X-ray report hip obtained at [facility] Xray pelvis and L Hip, 02/19/2020 IMPRESSION:  Negative pelvis and left hip    Assessment and Plan: 64 y.o. female with left hip pain.  Originally seen in June.  This is not become more of a chronic issue with exacerbation.  Pain originally thought to be due to greater trochanteric bursitis.  She has failed trials of physical therapy and injection and now is worsening and has recently had a  fall with exacerbation.  At this point concern for a more significant injury or dysfunction.  Plan to proceed with MRI hip.  Fortunately x-ray obtained at [facility] normal per report.  Recheck following MRI.  Discussed dosing regarding meloxicam or oral diclofenac.   PDMP not reviewed this encounter. Orders Placed This Encounter  Procedures  . MR HIP LEFT WO CONTRAST    Standing Status:   Future    Standing Expiration Date:   03/03/2021    Order Specific Question:   What is the patient's sedation requirement?    Answer:   No Sedation    Order Specific Question:   Does the patient have a pacemaker or implanted devices?    Answer:   No    Order Specific Question:   Preferred imaging location?    Answer:   Product/process development scientist (table limit-350lbs)    Order Specific Question:   Radiology Contrast Protocol - do NOT remove file path    Answer:   [email or web address]   No orders of the defined types were placed in this encounter.    Discussed warning signs or symptoms. Please see discharge instructions. Patient expresses understanding.   The above documentation has been reviewed and is accurate and complete [name], M.D.

## 2020-03-06 ENCOUNTER — Telehealth: Payer: Self-pay | Admitting: Internal Medicine

## 2020-03-06 DIAGNOSIS — M25552 Pain in left hip: Secondary | ICD-10-CM

## 2020-03-06 DIAGNOSIS — M961 Postlaminectomy syndrome, not elsewhere classified: Secondary | ICD-10-CM | POA: Diagnosis not present

## 2020-03-06 DIAGNOSIS — M5136 Other intervertebral disc degeneration, lumbar region: Secondary | ICD-10-CM | POA: Diagnosis not present

## 2020-03-06 DIAGNOSIS — M542 Cervicalgia: Secondary | ICD-10-CM | POA: Diagnosis not present

## 2020-03-06 DIAGNOSIS — Z79891 Long term (current) use of opiate analgesic: Secondary | ICD-10-CM | POA: Diagnosis not present

## 2020-03-06 DIAGNOSIS — M4726 Other spondylosis with radiculopathy, lumbar region: Secondary | ICD-10-CM | POA: Diagnosis not present

## 2020-03-06 NOTE — Telephone Encounter (Signed)
Sent to Dr. John. 

## 2020-03-06 NOTE — Telephone Encounter (Signed)
Ok referral done 

## 2020-03-06 NOTE — Telephone Encounter (Signed)
Patient called and stated her pain management doctor told her to call Dr.John and inform him she needs OT.  She does not know if that doctor is going to send over anything about it.  Stated she did not need an OV was last seen on 02/17/2020.  Please follow up with patient.  Informed patient Dr.John is out of office this week.

## 2020-03-07 DIAGNOSIS — Z7982 Long term (current) use of aspirin: Secondary | ICD-10-CM | POA: Diagnosis not present

## 2020-03-07 DIAGNOSIS — E785 Hyperlipidemia, unspecified: Secondary | ICD-10-CM | POA: Diagnosis not present

## 2020-03-07 DIAGNOSIS — M7062 Trochanteric bursitis, left hip: Secondary | ICD-10-CM | POA: Diagnosis not present

## 2020-03-07 DIAGNOSIS — M797 Fibromyalgia: Secondary | ICD-10-CM | POA: Diagnosis not present

## 2020-03-07 DIAGNOSIS — Z9181 History of falling: Secondary | ICD-10-CM | POA: Diagnosis not present

## 2020-03-07 DIAGNOSIS — G8929 Other chronic pain: Secondary | ICD-10-CM | POA: Diagnosis not present

## 2020-03-07 DIAGNOSIS — J449 Chronic obstructive pulmonary disease, unspecified: Secondary | ICD-10-CM | POA: Diagnosis not present

## 2020-03-07 DIAGNOSIS — F17210 Nicotine dependence, cigarettes, uncomplicated: Secondary | ICD-10-CM | POA: Diagnosis not present

## 2020-03-09 DIAGNOSIS — M797 Fibromyalgia: Secondary | ICD-10-CM | POA: Diagnosis not present

## 2020-03-09 DIAGNOSIS — F17210 Nicotine dependence, cigarettes, uncomplicated: Secondary | ICD-10-CM | POA: Diagnosis not present

## 2020-03-09 DIAGNOSIS — J449 Chronic obstructive pulmonary disease, unspecified: Secondary | ICD-10-CM | POA: Diagnosis not present

## 2020-03-09 DIAGNOSIS — G8929 Other chronic pain: Secondary | ICD-10-CM | POA: Diagnosis not present

## 2020-03-09 DIAGNOSIS — E785 Hyperlipidemia, unspecified: Secondary | ICD-10-CM | POA: Diagnosis not present

## 2020-03-09 DIAGNOSIS — Z7982 Long term (current) use of aspirin: Secondary | ICD-10-CM | POA: Diagnosis not present

## 2020-03-09 DIAGNOSIS — M7062 Trochanteric bursitis, left hip: Secondary | ICD-10-CM | POA: Diagnosis not present

## 2020-03-09 DIAGNOSIS — Z9181 History of falling: Secondary | ICD-10-CM | POA: Diagnosis not present

## 2020-03-12 ENCOUNTER — Other Ambulatory Visit: Payer: Medicare Other

## 2020-03-12 ENCOUNTER — Other Ambulatory Visit: Payer: Self-pay

## 2020-03-12 ENCOUNTER — Ambulatory Visit (INDEPENDENT_AMBULATORY_CARE_PROVIDER_SITE_OTHER): Payer: Medicare Other

## 2020-03-12 DIAGNOSIS — M25552 Pain in left hip: Secondary | ICD-10-CM

## 2020-03-12 DIAGNOSIS — M1612 Unilateral primary osteoarthritis, left hip: Secondary | ICD-10-CM | POA: Diagnosis not present

## 2020-03-13 NOTE — Progress Notes (Signed)
MRI hip shows a tear of the gluteus medius tendon from the hip bone where it attaches.  This is the location of your pain and very likely the cause of your pain.  Return in clinic with me in person to review the findings and discuss options in further detail.

## 2020-03-14 ENCOUNTER — Encounter: Payer: Self-pay | Admitting: Physician Assistant

## 2020-03-14 ENCOUNTER — Other Ambulatory Visit: Payer: Self-pay

## 2020-03-14 ENCOUNTER — Ambulatory Visit (INDEPENDENT_AMBULATORY_CARE_PROVIDER_SITE_OTHER): Payer: Medicare Other | Admitting: Physician Assistant

## 2020-03-14 DIAGNOSIS — F411 Generalized anxiety disorder: Secondary | ICD-10-CM | POA: Diagnosis not present

## 2020-03-14 DIAGNOSIS — F331 Major depressive disorder, recurrent, moderate: Secondary | ICD-10-CM

## 2020-03-14 DIAGNOSIS — F259 Schizoaffective disorder, unspecified: Secondary | ICD-10-CM | POA: Diagnosis not present

## 2020-03-14 DIAGNOSIS — Z9181 History of falling: Secondary | ICD-10-CM | POA: Diagnosis not present

## 2020-03-14 DIAGNOSIS — M7062 Trochanteric bursitis, left hip: Secondary | ICD-10-CM | POA: Diagnosis not present

## 2020-03-14 DIAGNOSIS — M797 Fibromyalgia: Secondary | ICD-10-CM | POA: Diagnosis not present

## 2020-03-14 DIAGNOSIS — Z7982 Long term (current) use of aspirin: Secondary | ICD-10-CM | POA: Diagnosis not present

## 2020-03-14 DIAGNOSIS — G8929 Other chronic pain: Secondary | ICD-10-CM | POA: Diagnosis not present

## 2020-03-14 DIAGNOSIS — R251 Tremor, unspecified: Secondary | ICD-10-CM | POA: Diagnosis not present

## 2020-03-14 DIAGNOSIS — E785 Hyperlipidemia, unspecified: Secondary | ICD-10-CM | POA: Diagnosis not present

## 2020-03-14 DIAGNOSIS — J449 Chronic obstructive pulmonary disease, unspecified: Secondary | ICD-10-CM | POA: Diagnosis not present

## 2020-03-14 DIAGNOSIS — F17210 Nicotine dependence, cigarettes, uncomplicated: Secondary | ICD-10-CM | POA: Diagnosis not present

## 2020-03-14 MED ORDER — BUPROPION HCL ER (XL) 300 MG PO TB24
300.0000 mg | ORAL_TABLET | Freq: Every day | ORAL | 1 refills | Status: DC
Start: 2020-03-14 — End: 2020-07-26

## 2020-03-14 NOTE — Progress Notes (Signed)
[facility] Med Check  Patient ID: [name],  MRN: [id number]  PCP: Biagio Borg, MD  Date of Evaluation: 03/14/2020 Time spent:40 minutes  Chief Complaint:  Chief Complaint    Follow-up      HISTORY/CURRENT STATUS: HPI For routine med check.  Her friend [name] is with her today.  Feels like she's doing pretty well overall.  Wonders if the Wellbutrin can be increased. Would like to feel more energetic and motivated.  That's the only problem as far as mood goes. The Wellbutrin did help with that issue in the beginning but not as much now.  Is able to enjoy things. Not isolating. Not crying easily.  She also asks about decreasing the [place].  She thinks that may be contributing to the tremor. Denies SI/HI.  Sees Neuro 04/19/2020 for the chronic tremor. Wasn't able to get appt till then. As described in previous notes, she's been dx w/ Essential Tremor in the past. Unsure if the psych meds have made it worse at any time. Tremor occurs at rest, but more so when she reaches for something, writes, tries to eat or things like that. The food falls off the eating utensil oftentimes. Has been treated for possible TD with Ingrezza, which wasn't helpful at all. Pt and her friend state she's not having any abnormal tongue or mouth movements and no tics. Is on Gabapentin now which doesn't help at all.   Patient denies increased energy with decreased need for sleep, no increased talkativeness, no racing thoughts, no impulsivity or risky behaviors, no increased spending, no increased libido, no grandiosity, no increased irritability or anger, and no hallucinations.  Denies paranoia.  Denies dizziness, syncope, seizures, numbness, tingling, tics, unsteady gait, slurred speech, confusion. Denies muscle or joint pain, stiffness, or dystonia.  Individual Medical History/ Review of Systems: Changes? :Yes  hurt her hip, has a tear of gluteus medius muscle. Will see ortho in a few days.    Past  medications for mental health diagnoses include: Ingrezza was ineffective, Lamictal, Risperdal, Zyprexa, Cogentin, Latuda, Cymbalta, lithium, Ambien, prazosin, Ativan, Wellbutrin, Zoloft, and probably others  Allergies: Ambien [zolpidem tartrate], Hydrocodone, Lactose intolerance (gi), Nsaids, Propoxyphene n-acetaminophen, Tramadol, and Chantix [varenicline]  Current Medications:  Current Outpatient Medications:  .  aspirin 325 MG tablet, Take 325 mg by mouth daily., Disp: , Rfl:  .  atorvastatin (LIPITOR) 40 MG tablet, TAKE 1 TABLET BY MOUTH  DAILY, Disp: 90 tablet, Rfl: 3 .  Cholecalciferol (VITAMIN D) 50 MCG (2000 UT) tablet, Take 2,000 Units by mouth daily., Disp: , Rfl:  .  fexofenadine (ALLEGRA) 180 MG tablet, Take 180 mg by mouth daily., Disp: , Rfl:  .  gabapentin (NEURONTIN) 800 MG tablet, TAKE 1 TABLET 3 TIMES  DAILY THEREAFTER (Patient taking differently: Take 800 mg by mouth 3 (three) times daily. ), Disp: 270 tablet, Rfl: 3 .  lamoTRIgine (LAMICTAL) 100 MG tablet, TAKE 1 TABLET BY MOUTH IN  THE MORNING AND 3 TABLETS  AT BEDTIME, Disp: 360 tablet, Rfl: 1 .  naloxone (NARCAN) 4 MG/0.1ML LIQD nasal spray kit, Place 0.4 mg into the nose as needed (opioid overdose). , Disp: , Rfl:  .  OLANZapine (ZYPREXA) 10 MG tablet, Take 2 tablets (20 mg total) by mouth at bedtime., Disp: 180 tablet, Rfl: 1 .  omeprazole (PRILOSEC) 40 MG capsule, Take 40 mg by mouth 2 (two) times daily. , Disp: , Rfl:  .  Oxycodone HCl 20 MG TABS, Take 1 tablet by mouth every 6 (  six) hours as needed (pain). , Disp: , Rfl:  .  potassium chloride (KLOR-CON) 10 MEQ tablet, TAKE 1 TABLET BY MOUTH  DAILY, Disp: 90 tablet, Rfl: 3 .  promethazine (PHENERGAN) 12.5 MG tablet, Take 1 tablet by mouth  every 6 hours as needed for nausea (Patient taking differently: Take 12.5 mg by mouth every 6 (six) hours as needed for nausea. ), Disp: 90 tablet, Rfl: 0 .  risperidone (RISPERDAL) 4 MG tablet, Take 1 tablet (4 mg total) by mouth  See admin instructions. Take 4 mg in the morning and 6 mg at night, Disp: 225 tablet, Rfl: 1 .  buPROPion (WELLBUTRIN XL) 300 MG 24 hr tablet, Take 1 tablet (300 mg total) by mouth daily., Disp: 90 tablet, Rfl: 1 .  diclofenac (VOLTAREN) 75 MG EC tablet, Take 75 mg by mouth 2 (two) times daily.  (Patient not taking: Reported on 03/14/2020), Disp: , Rfl:  .  meloxicam (MOBIC) 15 MG tablet, Take 15 mg by mouth daily.  (Patient not taking: Reported on 03/14/2020), Disp: , Rfl:  Medication Side Effects: Possibly tremor secondary to Risperdal, Zyprexa, and or Lamictal?  Family Medical/ Social History: Changes? No  MENTAL HEALTH EXAM:  There were no vitals taken for this visit.There is no height or weight on file to calculate BMI.  General Appearance: Casual, Neat and Well Groomed  Eye Contact:  Good  Speech:  Clear and Coherent and Normal Rate  Volume:  Normal  Mood:  Euthymic  Affect:  Appropriate  Thought Process:  Goal Directed and Descriptions of Associations: Intact  Orientation:  Full (Time, Place, and Person)  Thought Content: Logical   Suicidal Thoughts:  No  Homicidal Thoughts:  No  Memory:  Recent;   Fair  Judgement:  Good  Insight:  Good  Psychomotor Activity:  walks with a cane, has bilat tremor but worse in dominant right at rest but worse with movement, such has handing me a sheet of paper. No abnl mouth movements or tongue protrusions. Finger tap is nl..  Feet shw no abnormalities. No movements of toes.   Concentration:  Concentration: Good  Recall:  Good  Fund of Knowledge: Good  Language: Good  Assets:  Desire for Improvement  ADL's:  Intact  Cognition: WNL  Prognosis:  Good    12/30/2019 BMP-Glucose was 111. Other labs essentially nl.PCP keeps up with routine labs. Due for Lipid panel in 4 months or so.  DIAGNOSES:    ICD-10-CM   1. Moderate episode of recurrent major depressive disorder (HCC)  F33.1   2. Schizoaffective disorder, unspecified type ([place])  F25.9   3.  Generalized anxiety disorder  F41.1   4. Tremor of both hands  R25.1     Receiving Psychotherapy: No    RECOMMENDATIONS:  PDMP reviewed. I provided 40 minutes face to face during this encounter, including review of chart for labs and other notes.  Increasing the Wellbutrin is a good idea. I want her to stay on same dose of Lamictal though, for now. I doubt that's causing the tremor, it's more likely the Risperdal and/or zyprexa, plus the essential tremor. After she sees neuro and she's doing well then, we'll decrease the Lamictal, if appropriate. Increase Wellbutrin XL to 300 mg p.o. daily. She'll use her supply of 150 mg and take 2 pills until they're gone, then get new Rx.  Continue Zyprexa 20 mg nightly. Continue gabapentin 800 mg, 1 p.o. 3 times daily. Continue Lamictal 100 mg, 1 p.o. every morning  and 3 p.o. nightly. Continue Risperdal 4 mg, 1 p.o. every morning and 1.5 pills nightly. Return in 2 months.  Donnal Moat, PA-C

## 2020-03-15 ENCOUNTER — Telehealth: Payer: Self-pay | Admitting: Internal Medicine

## 2020-03-15 NOTE — Telephone Encounter (Signed)
Geneiver from [place] called and is requesting verbal orders for OT evaluation.

## 2020-03-16 DIAGNOSIS — J449 Chronic obstructive pulmonary disease, unspecified: Secondary | ICD-10-CM | POA: Diagnosis not present

## 2020-03-16 DIAGNOSIS — M7062 Trochanteric bursitis, left hip: Secondary | ICD-10-CM | POA: Diagnosis not present

## 2020-03-16 DIAGNOSIS — E785 Hyperlipidemia, unspecified: Secondary | ICD-10-CM | POA: Diagnosis not present

## 2020-03-16 DIAGNOSIS — F17210 Nicotine dependence, cigarettes, uncomplicated: Secondary | ICD-10-CM | POA: Diagnosis not present

## 2020-03-16 DIAGNOSIS — Z7982 Long term (current) use of aspirin: Secondary | ICD-10-CM | POA: Diagnosis not present

## 2020-03-16 DIAGNOSIS — G8929 Other chronic pain: Secondary | ICD-10-CM | POA: Diagnosis not present

## 2020-03-16 DIAGNOSIS — Z9181 History of falling: Secondary | ICD-10-CM | POA: Diagnosis not present

## 2020-03-16 DIAGNOSIS — M797 Fibromyalgia: Secondary | ICD-10-CM | POA: Diagnosis not present

## 2020-03-16 NOTE — Telephone Encounter (Signed)
Sent to Dr. John. 

## 2020-03-16 NOTE — Telephone Encounter (Signed)
Ok for verbal 

## 2020-03-20 ENCOUNTER — Other Ambulatory Visit: Payer: Self-pay

## 2020-03-20 ENCOUNTER — Encounter: Payer: Self-pay | Admitting: Family Medicine

## 2020-03-20 ENCOUNTER — Ambulatory Visit (INDEPENDENT_AMBULATORY_CARE_PROVIDER_SITE_OTHER): Payer: Medicare Other | Admitting: Family Medicine

## 2020-03-20 VITALS — BP 90/60 | HR 84 | Ht 65.0 in | Wt 146.0 lb

## 2020-03-20 DIAGNOSIS — S76019A Strain of muscle, fascia and tendon of unspecified hip, initial encounter: Secondary | ICD-10-CM | POA: Diagnosis not present

## 2020-03-20 NOTE — Patient Instructions (Signed)
Thank you for coming in today.  You should hear from Dr Randel Pigg office soon.  Let me know if you dont hear soon or want a second opinion.   Keep me updated.   If you want a CD of your images call Bonnita Nasuti at [phone] and she will make you a CD.

## 2020-03-20 NOTE — Progress Notes (Signed)
[name], am serving as a Education administrator for Dr. Lynne [name].  [name] is a 64 y.o. female who presents to [place] at [facility] today for f/u of L hip pain and L MRI review.  She was last seen by Dr. [name] on 03/03/20 after falling on 02/18/20 and landing on her L hip.  She was seen a the [place] ED in [place] on 02/19/20.  Since her last visit, pt reports that she still feels the same.   Diagnostic testing: L hip MRI- 03/12/20; L hip XR- 12/07/19   Pertinent review of systems: No fevers or chills  Relevant historical information: Essential tremor has appointment with neurology next month.   Exam:  BP 90/60 (BP Location: Left Arm, Patient Position: Sitting, Cuff Size: Normal)   Pulse 84   Ht 5\' 5"  (1.651 m)   Wt 146 lb (66.2 kg)   SpO2 95%   BMI 24.30 kg/m  General: Well Developed, well nourished, and in no acute distress.   MSK: Left hip tender palpation greater trochanter antalgic gait using a cane to ambulate. Hand tremor present bilaterally.    Lab and Radiology Results  EXAM: MR OF THE LEFT HIP WITHOUT CONTRAST  TECHNIQUE: Multiplanar, multisequence MR imaging was performed. No intravenous contrast was administered.  COMPARISON:  CT scan 02/11/2018  FINDINGS: Both hips are normally located. Mild degenerative changes bilaterally with degenerative chondrosis and early joint space narrowing. No significant spurring or subchondral cystic change. No findings for stress fracture or AVN.  No hip joint effusion or periarticular fluid collections to suggest a paralabral cyst. No obvious labral tear.  The pubic symphysis and SI joints are intact. No pelvic fractures or bone lesions.  The left gluteus medius tendon is torn from its attachment site on the greater trochanter. The gluteus minimus and gluteus maximus tendons are intact. There is associated moderate diffuse edema like signal changes in the gluteus medius muscle. Mild  fatty atrophy noted.  No significant peritendinosis or trochanteric bursitis. The hamstring tendons are intact.  No significant intrapelvic abnormalities are identified.  IMPRESSION: 1. Mild bilateral hip joint degenerative changes but no findings for stress fracture or AVN. 2. Torn left gluteus medius tendon from its attachment site on the greater trochanter. 3. No significant intrapelvic abnormalities.   Electronically Signed   By: Marijo Sanes M.D.   On: 03/13/2020 10:19 I, [name], personally (independently) visualized and performed the interpretation of the images attached in this note. Avulsed gluteus medius tendon with retraction.   Assessment and Plan: 64 y.o. female with left lateral hip pain due to gluteus medius avulsion.  Injury probably occurred 2 years ago and patient is slowly been worsening.  At this point I believe she has failed conservative management.  Based on injury seen on MRI will refer to orthopedic surgery to discuss surgical options.  Due to her overall other medical issues and relative poor health she is not an excellent surgical candidate but I think it is at least worthwhile for her to discuss her surgical options and have an independent evaluation with an orthopedic surgeon.   PDMP not reviewed this encounter. Orders Placed This Encounter  Procedures  . Ambulatory referral to Orthopedic Surgery    Referral Priority:   Routine    Referral Type:   Surgical    Referral Reason:   Specialty Services Required    Referred to Provider:   Meredith Pel, MD    Requested Specialty:  Orthopedic Surgery    Number of Visits Requested:   1   No orders of the defined types were placed in this encounter.    Discussed warning signs or symptoms. Please see discharge instructions. Patient expresses understanding.   The above documentation has been reviewed and is accurate and complete [name], M.D.  Total encounter time 20 minutes including  face-to-face time with the patient and, reviewing past medical record, and charting on the date of service.   Reviewed MRI findings and discussed treatment options with patient.

## 2020-03-20 NOTE — Telephone Encounter (Signed)
Geneiver from [place] called and is requesting verbals for physical therapy re-certification next week.   [place]

## 2020-03-21 DIAGNOSIS — M7062 Trochanteric bursitis, left hip: Secondary | ICD-10-CM | POA: Diagnosis not present

## 2020-03-21 DIAGNOSIS — G8929 Other chronic pain: Secondary | ICD-10-CM | POA: Diagnosis not present

## 2020-03-21 DIAGNOSIS — M797 Fibromyalgia: Secondary | ICD-10-CM | POA: Diagnosis not present

## 2020-03-21 DIAGNOSIS — F17210 Nicotine dependence, cigarettes, uncomplicated: Secondary | ICD-10-CM | POA: Diagnosis not present

## 2020-03-21 DIAGNOSIS — Z9181 History of falling: Secondary | ICD-10-CM | POA: Diagnosis not present

## 2020-03-21 DIAGNOSIS — E785 Hyperlipidemia, unspecified: Secondary | ICD-10-CM | POA: Diagnosis not present

## 2020-03-21 DIAGNOSIS — J449 Chronic obstructive pulmonary disease, unspecified: Secondary | ICD-10-CM | POA: Diagnosis not present

## 2020-03-21 DIAGNOSIS — Z7982 Long term (current) use of aspirin: Secondary | ICD-10-CM | POA: Diagnosis not present

## 2020-03-21 NOTE — Telephone Encounter (Signed)
Ok for verbal ,thanks 

## 2020-03-22 ENCOUNTER — Ambulatory Visit: Payer: Medicare Other | Admitting: Orthopedic Surgery

## 2020-03-22 ENCOUNTER — Encounter: Payer: Self-pay | Admitting: Orthopedic Surgery

## 2020-03-22 DIAGNOSIS — S76012A Strain of muscle, fascia and tendon of left hip, initial encounter: Secondary | ICD-10-CM | POA: Diagnosis not present

## 2020-03-22 NOTE — Telephone Encounter (Signed)
Called [name] back there was no answer LMOM w/MD response.Marland KitchenJohny [name]

## 2020-03-22 NOTE — Progress Notes (Signed)
Office Visit Note   Patient: [name]           Date of Birth: 07/12/1955           MRN: [id number] Visit Date: 03/22/2020 Requested by: Gregor Hams, MD [place],  [place] [address] PCP: Biagio Borg, MD  Subjective: Chief Complaint  Patient presents with  . Left Hip - Pain    HPI: [name] is a patient with left hip pain. She is had an MRI scan which is reviewed. Reports generally constant pain worse with ambulation. Has been going on for several months. She does smoke 1 pack/day and has a central tremor. Lives alone. MRI scan is reviewed and it does show gluteus medius tear with retraction and atrophy. No intra-articular pathology in the left hip.              ROS: All systems reviewed are negative as they relate to the chief complaint within the history of present illness.  Patient denies  fevers or chills.   Assessment & Plan: Visit Diagnoses: No diagnosis found.  Plan: Impression is left hip gluteus medius tendon tear with some retraction and atrophy. She does not have much of a Trendelenburg gait at this time. She does have pain which is understandable. I gave her about a 10% chance of having a great outcome with surgery. With the retraction and atrophy we would likely have to use some type of dermal allograft to span the defect and to get that to heal and the smoker and be functional is a big task. She also lives alone which would likely put increased stress on the repair as it would be difficult for her to maintain the weightbearing restrictions from surgery. For all these reasons we discussed the risk and benefits of surgery and she has decided to hold off on surgery for now. She is going to think about it for 4 weeks and then we can decide definitively. Follow-Up Instructions: Return if symptoms worsen or fail to improve.   Orders:  No orders of the defined types were placed in this encounter.  No orders of the defined types were placed in this  encounter.     Procedures: No procedures performed   Clinical Data: No additional findings.  Objective: Vital Signs: There were no vitals taken for this visit.  Physical Exam:   Constitutional: Patient appears well-developed HEENT:  Head: Normocephalic Eyes:EOM are normal Neck: Normal range of motion Cardiovascular: Normal rate Pulmonary/chest: Effort normal Neurologic: Patient is alert Skin: Skin is warm Psychiatric: Patient has normal mood and affect    Ortho Exam: Ortho exam demonstrates full active and passive range of motion of both hips. She actually has pretty reasonable hip flexion adduction and abduction strength. Does have some pain when standing on the left leg. Slight Trendelenburg gait but her pelvis does remain level when standing on the right foot alone and left foot alone.  Specialty Comments:  No specialty comments available.  Imaging: No results found.   PMFS History: Patient Active Problem List   Diagnosis Date Noted  . Tear of gluteus medius tendon, initial encounter 03/20/2020  . Gait disorder 02/17/2020  . Left hip pain 12/07/2019  . RLQ abdominal pain 02/09/2018  . Epigastric pain 08/11/2017  . Chronic RLQ pain 08/11/2017  . Chest pain 08/11/2017  . Smoker 08/11/2017  . Eustachian tube dysfunction, bilateral 02/06/2017  . Hyperglycemia 08/09/2016  . Preventative health care 08/09/2016  . Urinary incontinence  08/09/2016  . Hoarseness 08/09/2016  . Left elbow pain 11/30/2015  . Cellulitis of elbow 10/26/2015  . Acute sinus infection 08/01/2015  . COPD exacerbation ([place]) 08/01/2015  . Abnormal TSH 04/01/2015  . Aspiration pneumonia ([place]) 04/01/2015  . Hoarseness or changing voice 03/29/2014  . Dysphagia 02/06/2014  . Dysuria 09/02/2013  . Chronic constipation 09/02/2013  . Hypotension, unspecified 05/20/2013  . Recurrent falls 05/20/2013  . Nausea alone 01/31/2013  . Orthostatic hypotension 01/29/2013  . Knee effusion, right  07/03/2012  . Chronic low back pain 05/21/2012  . Tremor 04/30/2012  . Lower abdominal pain 07/30/2011  . Low back pain 07/30/2011  . Urinary incontinence, mixed 07/30/2011  . Left knee pain 07/30/2011  . Right shoulder pain 05/17/2011  . Insomnia 03/02/2011  . Brain aneurysm 02/15/2011  . INSOMNIA-SLEEP DISORDER-UNSPEC 08/31/2010  . ULNAR NEUROPATHY 10/31/2009  . Vitamin D deficiency 09/27/2009  . CHRONIC MIGRAINE W/O AURA W/INTRACTABLE W/O SM 05/02/2009  . LEUKOCYTOSIS 04/05/2009  . INTERMITTENT VERTIGO 02/16/2009  . PERIPHERAL EDEMA 02/16/2009  . COPD (chronic obstructive pulmonary disease) ([place]) 09/21/2008  . PULMONARY NODULE 09/21/2008  . GASTRITIS 09/19/2008  . Gastroparesis 09/19/2008  . Blind loop syndrome 09/01/2008  . B12 deficiency 08/18/2008  . BIPOLAR I D/O MOST RECENT EPIS DEPRESSED MILD 08/16/2008  . FIBROMYALGIA 07/20/2008  . WEIGHT LOSS 07/11/2008  . HLD (hyperlipidemia) 07/20/2007  . ANXIETY 07/20/2007  . PERIPHERAL NEUROPATHY 07/20/2007  . Allergic rhinitis 07/20/2007  . GERD 07/20/2007  . IBS 07/20/2007  . COLONIC POLYPS, HX OF 07/20/2007  . Depression 11/18/2006   Past Medical History:  Diagnosis Date  . Allergic rhinitis   . Arthritis   . Brain aneurysm   . Chronic abdominal pain   . COPD (chronic obstructive pulmonary disease) ([place])    denies  . Depression    bipolar  . Fibromyalgia   . Gastroparesis   . GERD (gastroesophageal reflux disease)   . H/O hiatal hernia   . History of colonic polyps   . HLD (hyperlipidemia)   . IBS (irritable bowel syndrome)    chronic constipation  . Migraines   . OCD (obsessive compulsive disorder)    anxiety  . Peripheral neuropathy     Family History  Problem Relation Age of Onset  . Cancer Father        lung  . Cancer Mother        brain  . Coronary artery disease Other        family hx  . Cancer Other        family hx  . Diabetes Other        DM - family hx  . Hypertension Other         family hx  . Seizures Other        family hx  . Cancer Sister        stomach  . Leukemia Sister   . Colon cancer Neg Hx     Past Surgical History:  Procedure Laterality Date  . ABDOMINAL HYSTERECTOMY    . APPENDECTOMY    . CERVICAL DISC SURGERY    . cholecystectomy    . coil to brain aneurysm    . COLONOSCOPY    . ELBOW SURGERY Bilateral    tendonitis  . IR ANGIO INTRA EXTRACRAN SEL COM CAROTID INNOMINATE BILAT MOD SED  07/31/2017  . IR ANGIO INTRA EXTRACRAN SEL COM CAROTID INNOMINATE BILAT MOD SED  12/30/2019  . IR ANGIO VERTEBRAL SEL VERTEBRAL BILAT  MOD SED  07/31/2017  . IR ANGIO VERTEBRAL SEL VERTEBRAL BILAT MOD SED  12/30/2019  . NECK SURGERY    . OOPHORECTOMY    . RADIOLOGY WITH ANESTHESIA N/A 05/10/2013   Procedure: RADIOLOGY WITH ANESTHESIA;  Surgeon: Rob Hickman, MD;  Location: [facility];  Service: Radiology;  Laterality: N/A;  . SHOULDER SURGERY Left   . TUBAL LIGATION    . VESICOVAGINAL FISTULA CLOSURE W/ TAH     Social History   Occupational History    Employer: DISABLED  Tobacco Use  . Smoking status: Current Every Day Smoker    Packs/day: 1.25    Years: 26.00    Pack years: 32.50    Types: Cigarettes  . Smokeless tobacco: Never Used  Vaping Use  . Vaping Use: Never used  Substance and Sexual Activity  . Alcohol use: No  . Drug use: No  . Sexual activity: Not Currently

## 2020-03-23 DIAGNOSIS — Z7982 Long term (current) use of aspirin: Secondary | ICD-10-CM | POA: Diagnosis not present

## 2020-03-23 DIAGNOSIS — M797 Fibromyalgia: Secondary | ICD-10-CM | POA: Diagnosis not present

## 2020-03-23 DIAGNOSIS — G8929 Other chronic pain: Secondary | ICD-10-CM | POA: Diagnosis not present

## 2020-03-23 DIAGNOSIS — F17210 Nicotine dependence, cigarettes, uncomplicated: Secondary | ICD-10-CM | POA: Diagnosis not present

## 2020-03-23 DIAGNOSIS — J449 Chronic obstructive pulmonary disease, unspecified: Secondary | ICD-10-CM | POA: Diagnosis not present

## 2020-03-23 DIAGNOSIS — M7062 Trochanteric bursitis, left hip: Secondary | ICD-10-CM | POA: Diagnosis not present

## 2020-03-23 DIAGNOSIS — Z9181 History of falling: Secondary | ICD-10-CM | POA: Diagnosis not present

## 2020-03-23 DIAGNOSIS — E785 Hyperlipidemia, unspecified: Secondary | ICD-10-CM | POA: Diagnosis not present

## 2020-03-29 DIAGNOSIS — Z9181 History of falling: Secondary | ICD-10-CM | POA: Diagnosis not present

## 2020-03-29 DIAGNOSIS — Z7982 Long term (current) use of aspirin: Secondary | ICD-10-CM | POA: Diagnosis not present

## 2020-03-29 DIAGNOSIS — M797 Fibromyalgia: Secondary | ICD-10-CM | POA: Diagnosis not present

## 2020-03-29 DIAGNOSIS — G8929 Other chronic pain: Secondary | ICD-10-CM | POA: Diagnosis not present

## 2020-03-29 DIAGNOSIS — M7062 Trochanteric bursitis, left hip: Secondary | ICD-10-CM | POA: Diagnosis not present

## 2020-03-29 DIAGNOSIS — J449 Chronic obstructive pulmonary disease, unspecified: Secondary | ICD-10-CM | POA: Diagnosis not present

## 2020-03-29 DIAGNOSIS — F17210 Nicotine dependence, cigarettes, uncomplicated: Secondary | ICD-10-CM | POA: Diagnosis not present

## 2020-03-29 DIAGNOSIS — E785 Hyperlipidemia, unspecified: Secondary | ICD-10-CM | POA: Diagnosis not present

## 2020-03-29 NOTE — Telephone Encounter (Signed)
Sent to Dr.JOhn. °

## 2020-03-29 NOTE — Telephone Encounter (Signed)
  [name] from [facility] requesting order to discontinue services. Patient has declined participation

## 2020-03-29 NOTE — Telephone Encounter (Signed)
Ok for verbals 

## 2020-03-31 NOTE — Telephone Encounter (Signed)
LDVM for [facility] for [facility] to discontinue services.

## 2020-04-19 ENCOUNTER — Ambulatory Visit: Payer: Medicare Other | Admitting: Neurology

## 2020-04-19 ENCOUNTER — Other Ambulatory Visit: Payer: Self-pay

## 2020-04-19 ENCOUNTER — Encounter: Payer: Self-pay | Admitting: Neurology

## 2020-04-19 VITALS — BP 110/72 | HR 84 | Ht 65.0 in | Wt 146.0 lb

## 2020-04-19 DIAGNOSIS — G2119 Other drug induced secondary parkinsonism: Secondary | ICD-10-CM | POA: Diagnosis not present

## 2020-04-19 NOTE — Patient Instructions (Signed)
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 I do not recommend treatment for your tremor disorder or parkinsonism with Parkinson's medication or tremor medication.  I would like for you to discuss treatment options with your mental health provider.   Please do not start any of your medications abruptly or on your own without specific instructions from your prescriber.   Your neurological exam is not telltale for hereditary tremors or what we call essential tremor and is not classic for "real" or true Parkinson's disease.  Please remember to stay well-hydrated with water and well rested.  Please use your walker for gait safety. Please follow-up with Donnal Moat and other specialists and primary care provider as scheduled.

## 2020-04-19 NOTE — Progress Notes (Signed)
Subjective:    Patient ID: [name] is a 64 y.o. female.  HPI     Lori Age, MD, PhD [facility] [address].O. [place],  [address]  Dear Helene Kelp,   I saw your patient, [name], for your kind request in my neurologic clinic today for initial consultation of her tremors.  The patient is accompanied by her son, [name], today.  As you know, [name] is a 64 year old left-handed woman with an underlying medical history of COPD, smoking, history of brain aneurysm (followed by Dr. Estanislado Pandy), allergic rhinitis, arthritis, reflux disease, fibromyalgia, depression, irritable bowel syndrome, hyperlipidemia, migraine headaches, peripheral neuropathy, chronic pain, on chronic narcotic pain medication followed by pain management, and history of OCD (by chart review), who reports an approximately 4 to 5-year history of intermittent hand tremors.  The tremor has become worse over time, is more noticeable on the left. She is left-handed when it comes to writing but does everything else with the right hand.  I reviewed your office note from 10/26/2019.  Her Zyprexa was decreased in the previous visit.  She was felt to have tardive dyskinesia and was tried on Ingrezza which did not help.  She has had previous treatment with Risperdal, Latuda, and has had trials of other psychotropic medications including Lamictal, Cymbalta, lithium, prazosin, Ativan, Zoloft.  She is currently on Wellbutrin long-acting 150 mg daily, gabapentin 800 mg strength 1 pill 3 times daily, she is on Lamictal 100 mg in the morning and 300 mg at bedtime, she is on Zyprexa 20 mg daily.  I also reviewed your office note from 03/14/2020.  She is currently on Risperdal as well 4 mg in the morning and 6 mg in the evening. Of note, she also is on hydrocodone 20 mg tablet 1 tablet every 6 hours as needed for pain.  Her other medications include potassium chloride, Phenergan as needed, Lipitor  40 mg daily, aspirin, Allegra, vitamin D, Narcan, diclofenac 75 mg twice daily, meloxicam 15 mg daily. She has 7 siblings, 2 children, 1 son, 1 daughter.  She lives alone, she is widowed.  No family history of tremors or Parkinson's disease.  She is on disability, previously worked in Therapist, art.  She smokes 1/2 packs/day and drinks caffeine in the form of coffee, 2 to 3 cups/day on average and one 12 ounce soda bottle.  She tries to hydrate well with water.  She has fallen, about 2-3 times in the past 2 months.  She has a walker with a seat, brought her walking stick today.  She does not drink alcohol.  She is currently not working on smoking cessation.  Her Past Medical History Is Significant For: Past Medical History:  Diagnosis Date  . Allergic rhinitis   . Arthritis   . Brain aneurysm   . Chronic abdominal pain   . COPD (chronic obstructive pulmonary disease) ([place])    denies  . Depression    bipolar  . Fibromyalgia   . Gastroparesis   . GERD (gastroesophageal reflux disease)   . H/O hiatal hernia   . History of colonic polyps   . HLD (hyperlipidemia)   . IBS (irritable bowel syndrome)    chronic constipation  . Migraines   . OCD (obsessive compulsive disorder)    anxiety  . Peripheral neuropathy     Her Past Surgical History Is Significant For: Past Surgical History:  Procedure Laterality Date  . ABDOMINAL HYSTERECTOMY    .  APPENDECTOMY    . CERVICAL DISC SURGERY    . cholecystectomy    . coil to brain aneurysm    . COLONOSCOPY    . ELBOW SURGERY Bilateral    tendonitis  . IR ANGIO INTRA EXTRACRAN SEL COM CAROTID INNOMINATE BILAT MOD SED  07/31/2017  . IR ANGIO INTRA EXTRACRAN SEL COM CAROTID INNOMINATE BILAT MOD SED  12/30/2019  . IR ANGIO VERTEBRAL SEL VERTEBRAL BILAT MOD SED  07/31/2017  . IR ANGIO VERTEBRAL SEL VERTEBRAL BILAT MOD SED  12/30/2019  . NECK SURGERY    . OOPHORECTOMY    . RADIOLOGY WITH ANESTHESIA N/A 05/10/2013   Procedure: RADIOLOGY WITH  ANESTHESIA;  Surgeon: Rob Hickman, MD;  Location: [facility];  Service: Radiology;  Laterality: N/A;  . SHOULDER SURGERY Left   . TUBAL LIGATION    . VESICOVAGINAL FISTULA CLOSURE W/ TAH      Her Family History Is Significant For: Family History  Problem Relation [name] of Onset  . Cancer Father        lung  . Cancer Mother        brain  . Coronary artery disease Other        family hx  . Cancer Other        family hx  . Diabetes Other        DM - family hx  . Hypertension Other        family hx  . Seizures Other        family hx  . Cancer Sister        stomach  . Leukemia Sister   . Colon cancer Neg Hx     Her Social History Is Significant For: Social History   Socioeconomic History  . Marital status: Widowed    Spouse name: Not on file  . Number of children: Not on file  . Years of education: Not on file  . Highest education level: Not on file  Occupational History    Employer: DISABLED  Tobacco Use  . Smoking status: Current Every Day Smoker    Packs/day: 1.25    Years: 26.00    Pack years: 32.50    Types: Cigarettes  . Smokeless tobacco: Never Used  Vaping Use  . Vaping Use: Never used  Substance and Sexual Activity  . Alcohol use: No  . Drug use: No  . Sexual activity: Not Currently  Other Topics Concern  . Not on file  Social History Narrative   Widow. Does not work. Drinks coffee in AM    Social Determinants of Health   Financial Resource Strain:   . Difficulty of Paying Living Expenses: Not on file  Food Insecurity:   . Worried About Charity fundraiser in the Last Year: Not on file  . Ran Out of Food in the Last Year: Not on file  Transportation Needs:   . Lack of Transportation (Medical): Not on file  . Lack of Transportation (Non-Medical): Not on file  Physical Activity:   . Days of Exercise per Week: Not on file  . Minutes of Exercise per Session: Not on file  Stress:   . Feeling of Stress : Not on file  Social Connections:   .  Frequency of Communication with Friends and Family: Not on file  . Frequency of Social Gatherings with Friends and Family: Not on file  . Attends Religious Services: Not on file  . Active Member of Clubs or Organizations: Not on file  .  Attends Archivist Meetings: Not on file  . Marital Status: Not on file    Her Allergies Are:  Allergies  Allergen Reactions  . Ambien [Zolpidem Tartrate] Other (See Comments)    Memory issues  . Hydrocodone Nausea Only    Can take it if its in a cough syrup   . Lactose Intolerance (Gi)     Bloating, upset stomach   . Nsaids     GI irritation, pt tolerates meloxicam and diclofenac   . Propoxyphene N-Acetaminophen Itching  . Tramadol Nausea And Vomiting  . Chantix [Varenicline] Other (See Comments)    "Makes bipolar worse" agitation, depression, sick to stomach  :   Her Current Medications Are:  Outpatient Encounter Medications as of 04/19/2020  Medication Sig  . aspirin 325 MG tablet Take 325 mg by mouth daily.  Marland Kitchen atorvastatin (LIPITOR) 40 MG tablet TAKE 1 TABLET BY MOUTH  DAILY  . buPROPion (WELLBUTRIN XL) 300 MG 24 hr tablet Take 1 tablet (300 mg total) by mouth daily.  . Cholecalciferol (VITAMIN D) 50 MCG (2000 UT) tablet Take 2,000 Units by mouth daily.  . fexofenadine (ALLEGRA) 180 MG tablet Take 180 mg by mouth daily.  Marland Kitchen gabapentin (NEURONTIN) 800 MG tablet TAKE 1 TABLET 3 TIMES  DAILY THEREAFTER (Patient taking differently: Take 800 mg by mouth 3 (three) times daily. )  . lamoTRIgine (LAMICTAL) 100 MG tablet TAKE 1 TABLET BY MOUTH IN  THE MORNING AND 3 TABLETS  AT BEDTIME  . naloxone (NARCAN) 4 MG/0.1ML LIQD nasal spray kit Place 0.4 mg into the nose as needed (opioid overdose).   Marland Kitchen OLANZapine (ZYPREXA) 10 MG tablet Take 2 tablets (20 mg total) by mouth at bedtime.  . Oxycodone HCl 20 MG TABS Take 1 tablet by mouth every 6 (six) hours as needed (pain).   . pantoprazole (PROTONIX) 40 MG tablet Take 40 mg by mouth 2 (two) times  daily.  . potassium chloride (KLOR-CON) 10 MEQ tablet TAKE 1 TABLET BY MOUTH  DAILY  . promethazine (PHENERGAN) 12.5 MG tablet Take 1 tablet by mouth  every 6 hours as needed for nausea (Patient taking differently: Take 12.5 mg by mouth every 6 (six) hours as needed for nausea. )  . risperidone (RISPERDAL) 4 MG tablet Take 1 tablet (4 mg total) by mouth See admin instructions. Take 4 mg in the morning and 6 mg at night  . [DISCONTINUED] pantoprazole (PROTONIX) 20 MG tablet Take 20 mg by mouth daily.  . [DISCONTINUED] diclofenac (VOLTAREN) 75 MG EC tablet Take 75 mg by mouth 2 (two) times daily.   . [DISCONTINUED] meloxicam (MOBIC) 15 MG tablet Take 15 mg by mouth daily.   . [DISCONTINUED] omeprazole (PRILOSEC) 40 MG capsule Take 40 mg by mouth 2 (two) times daily.    No facility-administered encounter medications on file as of 04/19/2020.  :   Review of Systems:  Out of a complete 14 point review of systems, all are reviewed and negative with the exception of these symptoms as listed below:  Review of Systems  Neurological:       Patient states that she has been dealing with tremors for about 4-5 years, but they have gotten progressively worse in the last 2 years.     Objective:  Neurological Exam  Physical Exam Physical Examination:   Vitals:   04/19/20 1433  BP: 110/72  Pulse: 84    General Examination: The patient is a very pleasant 64 y.o. female in no acute distress.  She appears deconditioned, adequately groomed.    HEENT: Normocephalic, atraumatic, pupils are equal, round and reactive to light and accommodation.  Extraocular tracking is mildly difficult for her.  Face is symmetric with mild facial masking, intermittent lower jaw tremor.  Speech is not dysarthric.  Airway examination reveals marginal dental hygiene with multiple missing teeth, moderate mouth dryness, tongue protrudes centrally in palate elevates symmetrically.  Hearing is grossly intact. Tongue protrudes  centrally and palate elevates symmetrically.  Mild nuchal rigidity noted, slight right head tilt, right shoulder lower than left.  Chest: Clear to auscultation without wheezing, rhonchi or crackles noted.  Heart: S1+S2+0, regular and normal without murmurs, rubs or gallops noted.   Abdomen: Soft, non-tender and non-distended with normal bowel sounds appreciated on auscultation.  Extremities: There is no pitting edema in the distal lower extremities bilaterally.   Skin: Warm and dry without trophic changes noted.  Musculoskeletal: exam reveals no obvious joint deformities, tenderness or joint swelling or erythema.   Neurologically:  Mental status: The patient is awake, alert and oriented in all 4 spheres. Her immediate and remote memory, attention, language skills and fund of knowledge are appropriate. There is no evidence of aphasia, agnosia, apraxia or anomia. Speech is clear with normal prosody and enunciation. Thought process is linear. Mood is constricted and affect is blunted.  Cranial nerves II - XII are as described above under HEENT exam. In addition: shoulder shrug is unequal, left shoulder higher than right.   Motor exam: Normal bulk, strength and tone is noted. There is no or rebound.  On 04/19/2020: On Archimedes spiral drawing she has trembling with the right hand, better with the left hand, handwriting is legible, on the smaller side, not particularly tremulous. She has a mild to moderate intermittent resting tremor in both upper extremities.  She has no lower extremity tremor, she has a mild left postural tremor, slight right postural tremor, no significant action tremor, no intention tremor.  Romberg is not tested due to safety concerns.  Fine motor skills are globally mildly impaired, no obvious lateralization noted. Cerebellar testing: No dysmetria or intention tremor on finger to nose testing.   Sensory exam: intact to light touch in the upper and lower extremities  bilaterally. Gait, station and balance: She stands with mild difficulty, posture is slightly stooped for [name], mild upper body curvature noted, possible scoliosis, left shoulder higher than right consistently.  She walks with a more noticeable hand tremor while walking, decreased arm swing, smaller steps noted, no obvious shuffling.  Balance is mildly impaired.  Assessment and Plan:  Assessment and Plan:  In summary, ADELISE BUSWELL is a very pleasant 64 y.o.-year old female with an underlying medical history of COPD, smoking, history of brain aneurysm (followed by Dr. Estanislado Pandy), allergic rhinitis, arthritis, reflux disease, fibromyalgia, depression, irritable bowel syndrome, hyperlipidemia, migraine headaches, peripheral neuropathy, chronic pain, on chronic narcotic pain medication followed by pain management, and history of OCD (by chart review), who presents for evaluation of her tremor disorder of approximately 4 to 5 years duration.  Her son reports that her tremor has become more noticeable and progressive over the past 2 years.  Of note, she has been on psychotropic medications for quite some time, currently on Risperdal and olanzapine, both can cause involuntary movements including dyskinesias but also parkinsonism.  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  Her history and family history are not in keeping with essential tremor or familial tremor  or Parkinson's disease in the family.  I discussed my findings with the patient and her son.  Unfortunately, there is no adequate way for me to help with treatment.  I would not recommend trial of Parkinson's medications for her.  She is asking if she would get better if she stopped her medication.  She is advised to discuss this with you and have her stop her medication without specific instructions from her prescriber and never abruptly.  She has an upcoming appointment with you.   Tapering her off of Risperdal and olanzapine may be a longer-term option for her but certainly not easily achieved.  She has a longstanding history of mental health issues and has been on multiple different medications.  This is certainly a challenging situation for her and she clearly is frustrated.  Unfortunately, there is not a whole lot I can add at this time.  She is advised to follow-up with you as scheduled as well as her primary care physician and her other specialists.  We talked about the importance of fall prevention.  She is advised to use her walker for gait safety, stay well-hydrated.  I encouraged her to pursue smoking cessation and good hydration with water.  We do not need to schedule a follow-up appointment.  I answered all of their questions today and the patient and her son were in agreement.  Thank you very much for allowing me to participate in the care of this nice patient. If I can be of any further assistance to you please do not hesitate to call me at [phone].  Sincerely,   Lori Age, MD, PhD

## 2020-04-20 ENCOUNTER — Ambulatory Visit: Payer: Medicare Other | Admitting: Orthopedic Surgery

## 2020-04-26 DIAGNOSIS — M542 Cervicalgia: Secondary | ICD-10-CM | POA: Diagnosis not present

## 2020-04-26 DIAGNOSIS — M961 Postlaminectomy syndrome, not elsewhere classified: Secondary | ICD-10-CM | POA: Diagnosis not present

## 2020-04-26 DIAGNOSIS — Z79891 Long term (current) use of opiate analgesic: Secondary | ICD-10-CM | POA: Diagnosis not present

## 2020-04-26 DIAGNOSIS — M4726 Other spondylosis with radiculopathy, lumbar region: Secondary | ICD-10-CM | POA: Diagnosis not present

## 2020-04-26 DIAGNOSIS — M5136 Other intervertebral disc degeneration, lumbar region: Secondary | ICD-10-CM | POA: Diagnosis not present

## 2020-04-26 NOTE — Progress Notes (Signed)
Note duplication 

## 2020-04-27 ENCOUNTER — Encounter: Payer: Self-pay | Admitting: Family Medicine

## 2020-04-27 ENCOUNTER — Ambulatory Visit: Payer: Self-pay

## 2020-04-27 ENCOUNTER — Ambulatory Visit (INDEPENDENT_AMBULATORY_CARE_PROVIDER_SITE_OTHER): Payer: Medicare Other | Admitting: Family Medicine

## 2020-04-27 ENCOUNTER — Other Ambulatory Visit: Payer: Self-pay

## 2020-04-27 ENCOUNTER — Ambulatory Visit (INDEPENDENT_AMBULATORY_CARE_PROVIDER_SITE_OTHER): Payer: Medicare Other

## 2020-04-27 VITALS — BP 102/62 | HR 91 | Ht 65.0 in | Wt 147.0 lb

## 2020-04-27 DIAGNOSIS — M25511 Pain in right shoulder: Secondary | ICD-10-CM

## 2020-04-27 NOTE — Patient Instructions (Signed)
Thank you for coming in today.  Call or go to the ER if you develop a large red swollen joint with extreme pain or oozing puss.   Please get an Xray today before you leave  Work on shoulder motion exercises.  If not improving let me know and I can add home health PT.   If not better could do MRI let know.

## 2020-04-27 NOTE — Progress Notes (Signed)
I, Wendy Poet, LAT, ATC, am serving as scribe for Dr. [name].  [name] is a 64 y.o. female who presents to [place] at [facility] today for R shoulder pain.  She was last seen by Dr. [name] on 03/03/20 for f/u of L hip pain that occurred after she fell on 02/18/20 and landed on her L hip.  Since then, pt reports R shoulder pain x approximately one month w/ no known MOI.  She locates her pain to her R superior shoulder.  She has a hx of a prior L shoulder RC repair.  Radiating pain: yes into her R upper arm R shoulder mechanical symptoms: no Aggravating factors: R shoulder functional IR; R shoulder flexion AROM >90 deg Treatments tried: Voltaren gel; Oxycodone through pain management   Pertinent review of systems: No fevers or chills  Relevant historical information: Medication induced Parkinson's symptoms.  History left hip gluteus chronic tear   Exam:  BP 102/62 (BP Location: Right Arm, Patient Position: Sitting, Cuff Size: Normal)   Pulse 91   Ht 5\' 5"  (1.651 m)   Wt 147 lb (66.7 kg)   SpO2 92%   BMI 24.46 kg/m  General: Well Developed, well nourished, and in no acute distress.   MSK: Right shoulder normal-appearing mildly tender palpation superior shoulder. Range of motion abduction 80 degrees active 100 degrees passive Strength diminished abduction external/internal rotation with pain and guarding. Unable to get into correct positioning for Hawkins and Neer's test. Pulses capillary fill and sensation are intact distally.  Neuro: Significant bilateral upper extremity tremor at rest.    Lab and Radiology Results  Diagnostic Limited MSK Ultrasound of: Right shoulder Biceps tendon intact.  Slight hypoechoic fluid tracking within tendon sheath proximal biceps tendon. Subscapularis tendon intact. Supraspinatus tendon hypoechoic fluid tracks superficial to tendon distally and bad subacromial bursa.  Tendon appears to be slightly bunched without  significant retraction indicating possible tear not visualized with ultrasound examination today. Infraspinatus tendon appears to be intact. AC joint degenerative. Impression: Subdeltoid and subacromial bursitis with possible supraspinatus partial tear.   Procedure: Real-time Ultrasound Guided Injection of right shoulder subacromial bursa Device: Philips Affiniti 50G Images permanently stored and available for review in PACS Verbal informed consent obtained.  Discussed risks and benefits of procedure. Warned about infection bleeding damage to structures skin hypopigmentation and fat atrophy among others. Patient expresses understanding and agreement Time-out conducted.   Noted no overlying erythema, induration, or other signs of local infection.   Skin prepped in a sterile fashion.   Local anesthesia: Topical Ethyl chloride.   With sterile technique and under real time ultrasound guidance:  40 mg of Kenalog and 2 mL of Marcaine injected into subacromial bursa. Fluid seen entering the bursa.   Completed without difficulty   Pain moderately resolved suggesting accurate placement of the medication.   Advised to call if fevers/chills, erythema, induration, drainage, or persistent bleeding.   Images permanently stored and available for review in the ultrasound unit.  Impression: Technically successful ultrasound guided injection.   X-ray images right shoulder obtained today personally and independently interpreted No acute fractures or severe degenerative changes.  Type I acromion. Await formal radiology review   Assessment and Plan: 63 y.o. female with right shoulder pain occurring for a few weeks without obvious injury.  Some concern for rotator cuff tear but no significant or severe retraction seen on ultrasound.  Additionally patient did not have an injury to explain onset of pain.  Plan for injection and home exercise program.  Recheck back in the near future if not improved.  Would also  consider referring to formal home health physical therapy if needed patient will let me know.  Also MRI as potential next step.   PDMP not reviewed this encounter. Orders Placed This Encounter  Procedures  . [place] LIMITED JOINT SPACE STRUCTURES UP RIGHT(NO LINKED CHARGES)    Order Specific Question:   Reason for Exam (SYMPTOM  OR DIAGNOSIS REQUIRED)    Answer:   R shoulder pain    Order Specific Question:   Preferred imaging location?    Answer:   [place] [facility]  . DG Shoulder Right    Standing Status:   Future    Number of Occurrences:   1    Standing Expiration Date:   04/27/2021    Order Specific Question:   Reason for Exam (SYMPTOM  OR DIAGNOSIS REQUIRED)    Answer:   eval shoulder pain    Order Specific Question:   Preferred imaging location?    Answer:   [facility]   No orders of the defined types were placed in this encounter.    Discussed warning signs or symptoms. Please see discharge instructions. Patient expresses understanding.   The above documentation has been reviewed and is accurate and complete [name], M.D.

## 2020-04-28 NOTE — Progress Notes (Signed)
X-ray right shoulder normal-appearing to radiology

## 2020-05-09 ENCOUNTER — Other Ambulatory Visit: Payer: Self-pay | Admitting: Internal Medicine

## 2020-05-09 DIAGNOSIS — M545 Low back pain, unspecified: Secondary | ICD-10-CM

## 2020-05-09 DIAGNOSIS — J438 Other emphysema: Secondary | ICD-10-CM

## 2020-05-09 DIAGNOSIS — R269 Unspecified abnormalities of gait and mobility: Secondary | ICD-10-CM

## 2020-05-19 ENCOUNTER — Ambulatory Visit: Payer: Medicare Other | Admitting: Physician Assistant

## 2020-06-07 ENCOUNTER — Ambulatory Visit (INDEPENDENT_AMBULATORY_CARE_PROVIDER_SITE_OTHER): Payer: Medicare Other | Admitting: Physician Assistant

## 2020-06-07 ENCOUNTER — Other Ambulatory Visit: Payer: Self-pay

## 2020-06-07 ENCOUNTER — Encounter: Payer: Self-pay | Admitting: Physician Assistant

## 2020-06-07 DIAGNOSIS — F17200 Nicotine dependence, unspecified, uncomplicated: Secondary | ICD-10-CM | POA: Diagnosis not present

## 2020-06-07 DIAGNOSIS — F411 Generalized anxiety disorder: Secondary | ICD-10-CM | POA: Diagnosis not present

## 2020-06-07 DIAGNOSIS — G2401 Drug induced subacute dyskinesia: Secondary | ICD-10-CM

## 2020-06-07 DIAGNOSIS — F259 Schizoaffective disorder, unspecified: Secondary | ICD-10-CM

## 2020-06-07 DIAGNOSIS — G2119 Other drug induced secondary parkinsonism: Secondary | ICD-10-CM

## 2020-06-07 MED ORDER — OLANZAPINE 10 MG PO TABS: ORAL_TABLET | ORAL | 1 refills | Status: DC

## 2020-06-07 NOTE — Progress Notes (Signed)
[facility] Med Check  Patient ID: BIRD TAILOR,  MRN: [id number]  PCP: Biagio Borg, MD  Date of Evaluation: 06/07/2020 Time spent:40 minutes  Chief Complaint:  Chief Complaint    Depression; Follow-up      HISTORY/CURRENT STATUS: HPI For routine med check.  Her friend [name] is with her today.  Vaughan Basta saw neurologist, Dr. Star Age on 04/19/2020.  She was diagnosed with drug induced Parkinson's, and per patient, it was recommended the patient go off of the antipsychotics.  Surena is very frustrated with her condition.  She cannot hold a coffee cup without spilling coffee.  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  Patient has asked me several times in the past if going off her medications would help.   Chelsei has experienced psychotic episodes on several different occasions throughout her life.  The most recent one has been within the past 5 years according to her friend [name].  Smrithi does not remember when it occurred.  I do not have record of a psychotic episode in my chart, so it must of been when she was seeing another provider, before epic.  In the past, she has had paranoia and visual and auditory hallucinations.  The symptoms have occurred whether she is in a manic or depressive state or not.  She has had episodes of mania but none recently.  In the past few years, she has been more depressed if there have been any changes in her mood.  Today, she denies increased energy with decreased need for sleep.  No impulsivity or risky behavior.  No increased spending or libido.  No grandiosity.  No paranoia or hallucinations.  Reports frustration over the tremor.  She has a hard time doing anything she might enjoy because of that issue.  She does not want to go out in public because it is embarrassing.  If she goes out to eat, she has a hard time feeding herself due to the shakes.  [name] reports that she has trouble even fastening her bra.  She is able to  fix herself food and shower, keep the house reasonably clean.  She does not cry easily.  Does get anxious at times, smoking helps.  States she is not ready to quit.  No panic attacks but more of a generalized sense of anxiety.  No suicidal or homicidal thoughts.  Denies dizziness, syncope, seizures, numbness, tingling, tics, slurred speech, confusion.  She does report problems with balance and is using a cane when she goes out.  Dr. Rexene Alberts mentions this as well and recommends that she use a walker.  Individual Medical History/ Review of Systems: Changes? :Yes  Is seeing an orthopedist for a tear in her left gluteus medius  Past medications for mental health diagnoses include: Ingrezza was ineffective, Lamictal, Risperdal, Zyprexa, Cogentin, Latuda, Cymbalta, lithium, Ambien, prazosin, Ativan, Wellbutrin, Zoloft, and probably others  Allergies: Ambien [zolpidem tartrate], Hydrocodone, Lactose intolerance (gi), Nsaids, Propoxyphene n-acetaminophen, Tramadol, and Chantix [varenicline]  Current Medications:  Current Outpatient Medications:  .  aspirin 325 MG tablet, Take 325 mg by mouth daily., Disp: , Rfl:  .  atorvastatin (LIPITOR) 40 MG tablet, TAKE 1 TABLET BY MOUTH  DAILY, Disp: 90 tablet, Rfl: 3 .  buPROPion (WELLBUTRIN XL) 300 MG 24 hr tablet, Take 1 tablet (300 mg total) by mouth daily., Disp: 90 tablet, Rfl: 1 .  Cholecalciferol (VITAMIN D) 50 MCG (2000 UT) tablet, Take 2,000 Units by mouth daily., Disp: ,  Rfl:  .  fexofenadine (ALLEGRA) 180 MG tablet, Take 180 mg by mouth daily., Disp: , Rfl:  .  gabapentin (NEURONTIN) 800 MG tablet, TAKE 1 TABLET 3 TIMES  DAILY THEREAFTER (Patient taking differently: Take 800 mg by mouth 3 (three) times daily.), Disp: 270 tablet, Rfl: 3 .  lamoTRIgine (LAMICTAL) 100 MG tablet, TAKE 1 TABLET BY MOUTH IN  THE MORNING AND 3 TABLETS  AT BEDTIME, Disp: 360 tablet, Rfl: 1 .  naloxone (NARCAN) 4 MG/0.1ML LIQD nasal spray kit, Place 0.4 mg into the nose as needed  (opioid overdose). , Disp: , Rfl:  .  Oxycodone HCl 20 MG TABS, Take 1 tablet by mouth every 6 (six) hours as needed (pain). , Disp: , Rfl:  .  pantoprazole (PROTONIX) 40 MG tablet, Take 40 mg by mouth 2 (two) times daily., Disp: , Rfl:  .  potassium chloride (KLOR-CON) 10 MEQ tablet, TAKE 1 TABLET BY MOUTH  DAILY, Disp: 90 tablet, Rfl: 3 .  promethazine (PHENERGAN) 12.5 MG tablet, Take 1 tablet by mouth  every 6 hours as needed for nausea (Patient taking differently: Take 12.5 mg by mouth every 6 (six) hours as needed for nausea.), Disp: 90 tablet, Rfl: 0 .  risperidone (RISPERDAL) 4 MG tablet, Take 1 tablet (4 mg total) by mouth See admin instructions. Take 4 mg in the morning and 6 mg at night, Disp: 225 tablet, Rfl: 1 .  OLANZapine (ZYPREXA) 10 MG tablet, 15 mg daily for 1 week, 10 mg daily for 1 week then 5 mg daily for 1 week and then stop., Disp: 180 tablet, Rfl: 1 Medication Side Effects: Tremor   Family Medical/ Social History: Changes? No  MENTAL HEALTH EXAM:  There were no vitals taken for this visit.There is no height or weight on file to calculate BMI.  General Appearance: Casual, Neat and Well Groomed  Eye Contact:  Fair  Speech:  Clear and Coherent and Normal Rate  Volume:  Normal  Mood:  Sad  Affect:  Congruent  Thought Process:  Goal Directed and Descriptions of Associations: Intact  Orientation:  Full (Time, Place, and Person)  Thought Content: Logical   Suicidal Thoughts:  No  Homicidal Thoughts:  No  Memory:  Recent;   Fair  Judgement:  Good  Insight:  Good  Psychomotor Activity:  She walks with a cane, slowly.  Bilateral hand tremors at rest, left greater than the right.  No abnormal mouth or tongue movements.  No lower extremity tremor.  Concentration:  Concentration: Good  Recall:  Good  Fund of Knowledge: Good  Language: Good  Assets:  Desire for Improvement  ADL's:  Intact  Cognition: WNL  Prognosis:  Good   Most recent pertinent labs : 12/30/2019   BMP-Glucose was 111. Other labs essentially nl.PCP keeps up with routine labs.  DIAGNOSES:    ICD-10-CM   1. Schizoaffective disorder, unspecified type ([place])  F25.9   2. Generalized anxiety disorder  F41.1   3. Smoker  F17.200   4. Tardive dyskinesia  G24.01   5. 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 I provided 40 minutes face to face during this encounter, including review of the neurology note and going back as far as 2013 when she first sought medical help for the tremor.   I appreciate Dr. Guadelupe Sabin help.  She mentions in her note that tapering her off Risperdal and Zyprexa may be a longer term  option for her but certainly not easily achieved, and I agree.  I discussed this with [place] and her friend.  It would be optimal for her to not be on an antipsychotic at all and for [place] to use mood stabilizers only.  She is already on Lamictal and has responded well to that as far as her depressive symptoms have been over the years.  In the future, we may need to add Depakote, Tegretol, or Trileptal.  [name] asks if 1 of those drugs can be substituted for Risperdal and Zyprexa.  I explained the risk of recurring psychosis is increased greatly if she is not on an antipsychotic.  We will not know until we wean her off.  I recommend only doing one thing at a time, and that would be weaning off of the Zyprexa.  May be the fact of just removing one of the antipsychotics will improve the tremor.  Although it may not occur right away.  I seriously doubt we will be able to wean her completely off Risperdal but we will revisit this issue at her next visit in 4 to 6 weeks.  She understands the increased risk of psychosis if she is not on any antipsychotic at all. We briefly discussed smoking cessation.  "I am not ready to quit." Wean off Zyprexa 10 mg, by taking 1.5 pills daily for 1 week, then 1 pill daily for 1 week, then 1/2 pill daily for 1  week and then stop.  These instructions are on her AVS, which I dictated in front of the patient and then reiterated the instructions. Continue Risperdal 4 mg, 1 p.o. every morning, 1.5 p.o. nightly. Continue Wellbutrin XL 300 mg, 1 p.o. q. a.m. Continue gabapentin 800 mg, 1 p.o. 3 times daily. Continue Lamictal 100 mg, 1 p.o. every morning and 3 p.o. nightly. Return in 4 to 6 weeks.    Donnal Moat, PA-C

## 2020-06-07 NOTE — Patient Instructions (Signed)
On the Zyprexa 10 mg, take 1.5 pills daily for 7 days, then 1 pill daily for 7 days, then 1/2 pill for 7 days and then stop.

## 2020-07-03 ENCOUNTER — Ambulatory Visit: Payer: Medicare Other | Admitting: Physician Assistant

## 2020-07-05 DIAGNOSIS — M5136 Other intervertebral disc degeneration, lumbar region: Secondary | ICD-10-CM | POA: Diagnosis not present

## 2020-07-05 DIAGNOSIS — M4726 Other spondylosis with radiculopathy, lumbar region: Secondary | ICD-10-CM | POA: Diagnosis not present

## 2020-07-05 DIAGNOSIS — M961 Postlaminectomy syndrome, not elsewhere classified: Secondary | ICD-10-CM | POA: Diagnosis not present

## 2020-07-05 DIAGNOSIS — M542 Cervicalgia: Secondary | ICD-10-CM | POA: Diagnosis not present

## 2020-07-26 ENCOUNTER — Other Ambulatory Visit: Payer: Self-pay | Admitting: Physician Assistant

## 2020-07-26 MED ORDER — BUPROPION HCL ER (XL) 300 MG PO TB24: 300.0000 mg | ORAL_TABLET | Freq: Every day | ORAL | 1 refills | Status: DC

## 2020-07-27 ENCOUNTER — Ambulatory Visit: Payer: Medicare Other | Admitting: Physician Assistant

## 2020-07-29 ENCOUNTER — Other Ambulatory Visit: Payer: Self-pay | Admitting: Physician Assistant

## 2020-08-01 ENCOUNTER — Telehealth: Payer: Self-pay | Admitting: Family Medicine

## 2020-08-01 DIAGNOSIS — M25511 Pain in right shoulder: Secondary | ICD-10-CM

## 2020-08-01 NOTE — Telephone Encounter (Signed)
Pt's R shoulder is not better and she would like to proceed w/ MRI.

## 2020-08-01 NOTE — Telephone Encounter (Signed)
Pt states she was advised to call if R shoulder not better and we would order an MRI, pt would like the MRI ordered as she has not improved.

## 2020-08-02 NOTE — Telephone Encounter (Signed)
Called pt and left detailed message regarding R shoulder MRI order placed to [facility].

## 2020-08-02 NOTE — Telephone Encounter (Signed)
MRI shoulder ordered

## 2020-08-04 ENCOUNTER — Other Ambulatory Visit: Payer: Self-pay

## 2020-08-05 ENCOUNTER — Ambulatory Visit (INDEPENDENT_AMBULATORY_CARE_PROVIDER_SITE_OTHER): Payer: Medicare Other

## 2020-08-05 DIAGNOSIS — M7521 Bicipital tendinitis, right shoulder: Secondary | ICD-10-CM | POA: Diagnosis not present

## 2020-08-05 DIAGNOSIS — M67813 Other specified disorders of tendon, right shoulder: Secondary | ICD-10-CM

## 2020-08-05 DIAGNOSIS — M25511 Pain in right shoulder: Secondary | ICD-10-CM

## 2020-08-07 NOTE — Progress Notes (Signed)
MRI right shoulder shows mild rotator cuff tendinitis without tear.  It also shows mild biceps tendinitis and mild arthritis.  Recommend that you return to clinic to go over the results of full detail and discuss treatment plan and options.

## 2020-08-08 ENCOUNTER — Encounter: Payer: Self-pay | Admitting: Internal Medicine

## 2020-08-08 ENCOUNTER — Other Ambulatory Visit: Payer: Self-pay

## 2020-08-08 ENCOUNTER — Ambulatory Visit (INDEPENDENT_AMBULATORY_CARE_PROVIDER_SITE_OTHER): Payer: Medicare Other | Admitting: Internal Medicine

## 2020-08-08 VITALS — BP 122/70 | HR 108 | Temp 98.3°F | Resp 18 | Ht 65.0 in | Wt 141.8 lb

## 2020-08-08 DIAGNOSIS — G8929 Other chronic pain: Secondary | ICD-10-CM | POA: Diagnosis not present

## 2020-08-08 DIAGNOSIS — Z23 Encounter for immunization: Secondary | ICD-10-CM | POA: Diagnosis not present

## 2020-08-08 DIAGNOSIS — R1032 Left lower quadrant pain: Secondary | ICD-10-CM

## 2020-08-08 DIAGNOSIS — Z0001 Encounter for general adult medical examination with abnormal findings: Secondary | ICD-10-CM

## 2020-08-08 DIAGNOSIS — E785 Hyperlipidemia, unspecified: Secondary | ICD-10-CM | POA: Diagnosis not present

## 2020-08-08 DIAGNOSIS — R739 Hyperglycemia, unspecified: Secondary | ICD-10-CM | POA: Diagnosis not present

## 2020-08-08 DIAGNOSIS — E538 Deficiency of other specified B group vitamins: Secondary | ICD-10-CM | POA: Diagnosis not present

## 2020-08-08 DIAGNOSIS — E559 Vitamin D deficiency, unspecified: Secondary | ICD-10-CM | POA: Diagnosis not present

## 2020-08-08 LAB — HEPATIC FUNCTION PANEL
ALT: 6 U/L (ref 0–35)
AST: 8 U/L (ref 0–37)
Albumin: 3.6 g/dL (ref 3.5–5.2)
Alkaline Phosphatase: 70 U/L (ref 39–117)
Bilirubin, Direct: 0 mg/dL (ref 0.0–0.3)
Total Bilirubin: 0.4 mg/dL (ref 0.2–1.2)
Total Protein: 5.7 g/dL — ABNORMAL LOW (ref 6.0–8.3)

## 2020-08-08 LAB — LIPID PANEL
Cholesterol: 162 mg/dL (ref 0–200)
HDL: 54 mg/dL (ref >39.00)
LDL Cholesterol: 94 mg/dL (ref 0–99)
NonHDL: 108.05
Total CHOL/HDL Ratio: 3
Triglycerides: 72 mg/dL (ref 0.0–149.0)
VLDL: 14.4 mg/dL (ref 0.0–40.0)

## 2020-08-08 LAB — TSH: TSH: 1.77 u[IU]/mL (ref 0.35–4.50)

## 2020-08-08 LAB — CBC WITH DIFFERENTIAL/PLATELET
Basophils Absolute: 0 10*3/uL (ref 0.0–0.1)
Basophils Relative: 0.5 % (ref 0.0–3.0)
Eosinophils Absolute: 0.1 10*3/uL (ref 0.0–0.7)
Eosinophils Relative: 1.2 % (ref 0.0–5.0)
HCT: 46.2 % — ABNORMAL HIGH (ref 36.0–46.0)
Hemoglobin: 15.8 g/dL — ABNORMAL HIGH (ref 12.0–15.0)
Lymphocytes Relative: 23.1 % (ref 12.0–46.0)
Lymphs Abs: 1.3 10*3/uL (ref 0.7–4.0)
MCHC: 34.1 g/dL (ref 30.0–36.0)
MCV: 99.4 fl (ref 78.0–100.0)
Monocytes Absolute: 0.6 10*3/uL (ref 0.1–1.0)
Monocytes Relative: 9.6 % (ref 3.0–12.0)
Neutro Abs: 3.8 10*3/uL (ref 1.4–7.7)
Neutrophils Relative %: 65.6 % (ref 43.0–77.0)
Platelets: 248 10*3/uL (ref 150.0–400.0)
RBC: 4.65 Mil/uL (ref 3.87–5.11)
RDW: 15.8 % — ABNORMAL HIGH (ref 11.5–15.5)
WBC: 5.8 10*3/uL (ref 4.0–10.5)

## 2020-08-08 LAB — BASIC METABOLIC PANEL
BUN: 5 mg/dL — ABNORMAL LOW (ref 6–23)
CO2: 34 mEq/L — ABNORMAL HIGH (ref 19–32)
Calcium: 8.7 mg/dL (ref 8.4–10.5)
Chloride: 99 mEq/L (ref 96–112)
Creatinine, Ser: 0.68 mg/dL (ref 0.40–1.20)
GFR: 93.37 mL/min (ref >60.00)
Glucose, Bld: 88 mg/dL (ref 70–99)
Potassium: 4 mEq/L (ref 3.5–5.1)
Sodium: 139 mEq/L (ref 135–145)

## 2020-08-08 LAB — VITAMIN D 25 HYDROXY (VIT D DEFICIENCY, FRACTURES): VITD: 39.09 ng/mL (ref 30.00–100.00)

## 2020-08-08 LAB — VITAMIN B12: Vitamin B-12: 91 pg/mL — ABNORMAL LOW (ref 211–911)

## 2020-08-08 LAB — LIPASE: Lipase: 5 U/L — ABNORMAL LOW (ref 11.0–59.0)

## 2020-08-08 NOTE — Patient Instructions (Addendum)
You had the pneumovax pneumonia shot today  Please quit smoking  Please continue all other medications as before, and refills have been done if requested.  Please have the pharmacy call with any other refills you may need.  Please continue your efforts at being more active, low cholesterol diet, and weight control.  You are otherwise up to date with prevention measures today.  Please keep your appointments with your specialists as you may have planned  Please go to the LAB at the blood drawing area for the tests to be done  You will be contacted by phone if any changes need to be made immediately.  Otherwise, you will receive a letter about your results with an explanation, but please check with MyChart first.  Please remember to sign up for MyChart if you have not done so, as this will be important to you in the future with finding out test results, communicating by private email, and scheduling acute appointments online when needed.  Please make an Appointment to return in 6 months, or sooner if needed

## 2020-08-08 NOTE — Progress Notes (Signed)
Patient ID: [name], female   DOB: Dec 31, 1955, 65 y.o.   MRN: [id number]         Chief Complaint:: wellness exam and Left Sided Pain (Rm 12. Patient stated that for the last couple of months she has been having a nodule in her lower left abdomen. Hurts all the time. Pain gets worse at times. She stated that pulling her pants up and down hurts, even wearing a seatbelt hurts. )         HPI:  [name] is a 65 y.o. female here for wellness exam, due for pneumovax, o/w up to date with preventive referrals and mmuinzations                        Also still smoking, not ready to quit.  Also has 2 mo ongoing now chronic LLQ pain, but Denies worsening reflux, dysphagia, n/v, bowel change or blood.  Not better with BM or urination, Denies urinary symptoms such as dysuria, frequency, urgency, flank pain, hematuria or n/v, fever, chills.  Pt denies chest pain, increased sob or doe, wheezing, orthopnea, PND, increased LE swelling, palpitations, dizziness or syncope.   Pt denies polydipsia, polyuria, Denies new focal neuro s/s.   Pt denies fever, night sweats, loss of appetite, or other constitutional symptoms, though has lost some wt recently  Wt Readings from Last 3 Encounters:  08/08/20 141 lb 12.8 oz (64.3 kg)  04/27/20 147 lb (66.7 kg)  04/19/20 146 lb (66.2 kg)   BP Readings from Last 3 Encounters:  08/08/20 122/70  04/27/20 102/62  04/19/20 110/72   Immunization History  Administered Date(s) Administered   H1N1 07/11/2008   Influenza Split 02/20/2012, 02/27/2014   Influenza Whole 05/21/2007, 04/15/2008, 03/16/2009, 02/21/2010   Influenza,inj,Quad PF,6+ Mos 03/31/2015, 03/01/2016   Influenza,inj,quad, With Preservative 03/21/2018, 03/05/2019   Influenza-Unspecified 01/15/2014, 04/17/2020   Moderna Sars-Covid-2 Vaccination 10/05/2019, 11/04/2019, 04/17/2020   Pneumococcal Conjugate-13 03/19/2016   Pneumococcal Polysaccharide-23 05/01/2015, 08/08/2020   Td 01/18/2008    Tdap 11/26/2018  There are no preventive care reminders to display for this patient.    Past Medical History:  Diagnosis Date   Allergic rhinitis    Arthritis    Brain aneurysm    Chronic abdominal pain    COPD (chronic obstructive pulmonary disease) (HCC)    denies   Depression    bipolar   Fibromyalgia    Gastroparesis    GERD (gastroesophageal reflux disease)    H/O hiatal hernia    History of colonic polyps    HLD (hyperlipidemia)    IBS (irritable bowel syndrome)    chronic constipation   Migraines    OCD (obsessive compulsive disorder)    anxiety   Peripheral neuropathy    Past Surgical History:  Procedure Laterality Date   ABDOMINAL HYSTERECTOMY     APPENDECTOMY     CERVICAL DISC SURGERY     cholecystectomy     coil to brain aneurysm     COLONOSCOPY     ELBOW SURGERY Bilateral    tendonitis   IR ANGIO INTRA EXTRACRAN SEL COM CAROTID INNOMINATE BILAT MOD SED  07/31/2017   IR ANGIO INTRA EXTRACRAN SEL COM CAROTID INNOMINATE BILAT MOD SED  12/30/2019   IR ANGIO VERTEBRAL SEL VERTEBRAL BILAT MOD SED  07/31/2017   IR ANGIO VERTEBRAL SEL VERTEBRAL BILAT MOD SED  12/30/2019   NECK SURGERY     OOPHORECTOMY     RADIOLOGY WITH ANESTHESIA N/A  05/10/2013   Procedure: RADIOLOGY WITH ANESTHESIA;  Surgeon: Rob Hickman, MD;  Location: [facility];  Service: Radiology;  Laterality: N/A;   SHOULDER SURGERY Left    TUBAL LIGATION     VESICOVAGINAL FISTULA CLOSURE W/ TAH      reports that she has been smoking cigarettes. She has a 39.00 pack-year smoking history. She has never used smokeless tobacco. She reports that she does not drink alcohol and does not use drugs. family history includes Cancer in her father, mother, sister, and another family member; Coronary artery disease in an other family member; Diabetes in an other family member; Hypertension in an other family member; Leukemia in her sister; Seizures in an other family member. Allergies   Allergen Reactions   Ambien [Zolpidem Tartrate] Other (See Comments)    Memory issues   Hydrocodone Nausea Only    Can take it if its in a cough syrup    Lactose Intolerance (Gi)     Bloating, upset stomach    Nsaids     GI irritation, pt tolerates meloxicam and diclofenac    Propoxyphene N-Acetaminophen Itching   Tramadol Nausea And Vomiting   Chantix [Varenicline] Other (See Comments)    "Makes bipolar worse" agitation, depression, sick to stomach   Current Outpatient Medications on File Prior to Visit  Medication Sig Dispense Refill   aspirin 325 MG tablet Take 325 mg by mouth daily.     atorvastatin (LIPITOR) 40 MG tablet TAKE 1 TABLET BY MOUTH  DAILY 90 tablet 3   buPROPion (WELLBUTRIN XL) 300 MG 24 hr tablet Take 1 tablet (300 mg total) by mouth daily. 90 tablet 1   Cholecalciferol (VITAMIN D) 50 MCG (2000 UT) tablet Take 2,000 Units by mouth daily.     fexofenadine (ALLEGRA) 180 MG tablet Take 180 mg by mouth daily.     gabapentin (NEURONTIN) 800 MG tablet TAKE 1 TABLET 3 TIMES  DAILY THEREAFTER (Patient taking differently: Take 800 mg by mouth 3 (three) times daily.) 270 tablet 3   lamoTRIgine (LAMICTAL) 100 MG tablet TAKE 1 TABLET BY MOUTH IN  THE MORNING AND 3 TABLETS  BY MOUTH AT BEDTIME 360 tablet 3   naloxone (NARCAN) 4 MG/0.1ML LIQD nasal spray kit Place 0.4 mg into the nose as needed (opioid overdose).      OLANZapine (ZYPREXA) 10 MG tablet 15 mg daily for 1 week, 10 mg daily for 1 week then 5 mg daily for 1 week and then stop. 180 tablet 1   Oxycodone HCl 20 MG TABS Take 1 tablet by mouth every 6 (six) hours as needed (pain).      pantoprazole (PROTONIX) 40 MG tablet Take 40 mg by mouth 2 (two) times daily.     potassium chloride (KLOR-CON) 10 MEQ tablet TAKE 1 TABLET BY MOUTH  DAILY 90 tablet 3   promethazine (PHENERGAN) 12.5 MG tablet Take 1 tablet by mouth  every 6 hours as needed for nausea (Patient taking differently: Take 12.5 mg by mouth every  6 (six) hours as needed for nausea.) 90 tablet 0   risperidone (RISPERDAL) 4 MG tablet TAKE 1 TABLET BY MOUTH IN  THE MORNING THEN 1 AND 1/2  TABLETS BY MOUTH AT NIGHT 225 tablet 3   No current facility-administered medications on file prior to visit.        ROS:  All others reviewed and negative.  Objective        PE:  BP 122/70    Pulse (!) 108  Temp 98.3 F (36.8 C) (Oral)    Resp 18    Ht 5' 5"  (1.651 m)    Wt 141 lb 12.8 oz (64.3 kg)    SpO2 97%    BMI 23.60 kg/m                 Constitutional: Pt appears in NAD               HENT: Head: NCAT.                Right Ear: External ear normal.                 Left Ear: External ear normal.                Eyes: . Pupils are equal, round, and reactive to light. Conjunctivae and EOM are normal               Nose: without d/c or deformity               Neck: Neck supple. Gross normal ROM               Cardiovascular: Normal rate and regular rhythm.                 Pulmonary/Chest: Effort normal and breath sounds without rales or wheezing.                Abd:  Soft, NT, ND, + BS, no organomegaly               Neurological: Pt is alert. At baseline orientation, motor grossly intact               Skin: Skin is warm. No rashes, no other new lesions, LE edema - none               Psychiatric: Pt behavior is normal without agitation   Micro: none  Cardiac tracings I have personally interpreted today:  none  Pertinent Radiological findings (summarize): none   Lab Results  Component Value Date   WBC 5.8 08/08/2020   HGB 15.8 (H) 08/08/2020   HCT 46.2 (H) 08/08/2020   PLT 248.0 08/08/2020   GLUCOSE 88 08/08/2020   CHOL 162 08/08/2020   TRIG 72.0 08/08/2020   HDL 54.00 08/08/2020   LDLDIRECT 102.7 08/21/2011   LDLCALC 94 08/08/2020   ALT 6 08/08/2020   AST 8 08/08/2020   NA 139 08/08/2020   K 4.0 08/08/2020   CL 99 08/08/2020   CREATININE 0.68 08/08/2020   BUN 5 (L) 08/08/2020   CO2 34 (H) 08/08/2020   TSH 1.77 08/08/2020    INR 0.9 12/30/2019   HGBA1C 5.6 08/08/2020   Assessment/Plan:  [name] is a 65 y.o. White or Caucasian [1] female with  has a past medical history of Allergic rhinitis, Arthritis, Brain aneurysm, Chronic abdominal pain, COPD (chronic obstructive pulmonary disease) ([place]), Depression, Fibromyalgia, Gastroparesis, GERD (gastroesophageal reflux disease), H/O hiatal hernia, History of colonic polyps, HLD (hyperlipidemia), IBS (irritable bowel syndrome), Migraines, OCD (obsessive compulsive disorder), and Peripheral neuropathy.  Encounter for well adult exam with abnormal findings Age and sex appropriate education and counseling updated with regular exercise and diet Referrals for preventative services - none needed Immunizations addressed - for pneumovax Smoking counseling  - counseled to quit Evidence for depression or other mood disorder - none significant Most recent labs reviewed. I have personally reviewed and have noted: 1) the patient's  medical and social history 2) The patient's current medications and supplements 3) The patient's height, weight, and BMI have been recorded in the chart   B12 deficiency Lab Results  Component Value Date   VITAMINB12 91 (L) 08/08/2020   Low, to start oral replacement - b12 1000 mcg qd   Chronic LLQ pain Exam benign, declines CT, cont to follow  HLD (hyperlipidemia) Lab Results  Component Value Date   LDLCALC 94 08/08/2020   Stable, pt to continue current statin lipitor 40   Current Outpatient Medications (Cardiovascular):    atorvastatin (LIPITOR) 40 MG tablet, TAKE 1 TABLET BY MOUTH  DAILY  Current Outpatient Medications (Respiratory):    fexofenadine (ALLEGRA) 180 MG tablet, Take 180 mg by mouth daily.   promethazine (PHENERGAN) 12.5 MG tablet, Take 1 tablet by mouth  every 6 hours as needed for nausea (Patient taking differently: Take 12.5 mg by mouth every 6 (six) hours as needed for nausea.)  Current Outpatient  Medications (Analgesics):    aspirin 325 MG tablet, Take 325 mg by mouth daily.   Oxycodone HCl 20 MG TABS, Take 1 tablet by mouth every 6 (six) hours as needed (pain).   Current Outpatient Medications (Hematological):    vitamin B-12 (CYANOCOBALAMIN) 1000 MCG tablet, Take 1 tablet (1,000 mcg total) by mouth daily.  Current Outpatient Medications (Other):    buPROPion (WELLBUTRIN XL) 300 MG 24 hr tablet, Take 1 tablet (300 mg total) by mouth daily.   Cholecalciferol (VITAMIN D) 50 MCG (2000 UT) tablet, Take 2,000 Units by mouth daily.   gabapentin (NEURONTIN) 800 MG tablet, TAKE 1 TABLET 3 TIMES  DAILY THEREAFTER (Patient taking differently: Take 800 mg by mouth 3 (three) times daily.)   lamoTRIgine (LAMICTAL) 100 MG tablet, TAKE 1 TABLET BY MOUTH IN  THE MORNING AND 3 TABLETS  BY MOUTH AT BEDTIME   naloxone (NARCAN) 4 MG/0.1ML LIQD nasal spray kit, Place 0.4 mg into the nose as needed (opioid overdose).    OLANZapine (ZYPREXA) 10 MG tablet, 15 mg daily for 1 week, 10 mg daily for 1 week then 5 mg daily for 1 week and then stop.   pantoprazole (PROTONIX) 40 MG tablet, Take 40 mg by mouth 2 (two) times daily.   potassium chloride (KLOR-CON) 10 MEQ tablet, TAKE 1 TABLET BY MOUTH  DAILY   risperidone (RISPERDAL) 4 MG tablet, TAKE 1 TABLET BY MOUTH IN  THE MORNING THEN 1 AND 1/2  TABLETS BY MOUTH AT NIGHT   Hyperglycemia Lab Results  Component Value Date   HGBA1C 5.6 08/08/2020   Stable, pt to continue current medical treatment  - diet   Vitamin D deficiency Last vitamin D Lab Results  Component Value Date   VD25OH 39.09 08/08/2020   Stable, cont oral replacement   Followup: Return in about 6 months (around 02/05/2021).  Cathlean Cower, MD 08/14/2020 11:00 PM [facility]

## 2020-08-09 LAB — HEMOGLOBIN A1C: Hgb A1c MFr Bld: 5.6 % (ref 4.6–6.5)

## 2020-08-10 ENCOUNTER — Encounter: Payer: Self-pay | Admitting: Internal Medicine

## 2020-08-10 ENCOUNTER — Other Ambulatory Visit: Payer: Self-pay | Admitting: Internal Medicine

## 2020-08-10 LAB — URINALYSIS, ROUTINE W REFLEX MICROSCOPIC
Bilirubin Urine: NEGATIVE
Hgb urine dipstick: NEGATIVE
Ketones, ur: NEGATIVE
Leukocytes,Ua: NEGATIVE
Nitrite: NEGATIVE
Specific Gravity, Urine: 1.01 (ref 1.000–1.030)
Total Protein, Urine: NEGATIVE
Urine Glucose: NEGATIVE
Urobilinogen, UA: 0.2 (ref 0.0–1.0)
pH: 7.5 (ref 5.0–8.0)

## 2020-08-10 MED ORDER — VITAMIN B-12 1000 MCG PO TABS: 1000.0000 ug | ORAL_TABLET | Freq: Every day | ORAL | 3 refills | Status: AC

## 2020-08-11 LAB — URINE CULTURE

## 2020-08-12 ENCOUNTER — Encounter: Payer: Self-pay | Admitting: Internal Medicine

## 2020-08-14 ENCOUNTER — Encounter: Payer: Self-pay | Admitting: Internal Medicine

## 2020-08-14 NOTE — Assessment & Plan Note (Signed)
Lab Results  Component Value Date   LDLCALC 94 08/08/2020   Stable, pt to continue current statin lipitor 40   Current Outpatient Medications (Cardiovascular):  .  atorvastatin (LIPITOR) 40 MG tablet, TAKE 1 TABLET BY MOUTH  DAILY  Current Outpatient Medications (Respiratory):  .  fexofenadine (ALLEGRA) 180 MG tablet, Take 180 mg by mouth daily. .  promethazine (PHENERGAN) 12.5 MG tablet, Take 1 tablet by mouth  every 6 hours as needed for nausea (Patient taking differently: Take 12.5 mg by mouth every 6 (six) hours as needed for nausea.)  Current Outpatient Medications (Analgesics):  .  aspirin 325 MG tablet, Take 325 mg by mouth daily. .  Oxycodone HCl 20 MG TABS, Take 1 tablet by mouth every 6 (six) hours as needed (pain).   Current Outpatient Medications (Hematological):  .  vitamin B-12 (CYANOCOBALAMIN) 1000 MCG tablet, Take 1 tablet (1,000 mcg total) by mouth daily.  Current Outpatient Medications (Other):  Marland Kitchen  buPROPion (WELLBUTRIN XL) 300 MG 24 hr tablet, Take 1 tablet (300 mg total) by mouth daily. .  Cholecalciferol (VITAMIN D) 50 MCG (2000 UT) tablet, Take 2,000 Units by mouth daily. Marland Kitchen  gabapentin (NEURONTIN) 800 MG tablet, TAKE 1 TABLET 3 TIMES  DAILY THEREAFTER (Patient taking differently: Take 800 mg by mouth 3 (three) times daily.) .  lamoTRIgine (LAMICTAL) 100 MG tablet, TAKE 1 TABLET BY MOUTH IN  THE MORNING AND 3 TABLETS  BY MOUTH AT BEDTIME .  naloxone (NARCAN) 4 MG/0.1ML LIQD nasal spray kit, Place 0.4 mg into the nose as needed (opioid overdose).  Marland Kitchen  OLANZapine (ZYPREXA) 10 MG tablet, 15 mg daily for 1 week, 10 mg daily for 1 week then 5 mg daily for 1 week and then stop. .  pantoprazole (PROTONIX) 40 MG tablet, Take 40 mg by mouth 2 (two) times daily. .  potassium chloride (KLOR-CON) 10 MEQ tablet, TAKE 1 TABLET BY MOUTH  DAILY .  risperidone (RISPERDAL) 4 MG tablet, TAKE 1 TABLET BY MOUTH IN  THE MORNING THEN 1 AND 1/2  TABLETS BY MOUTH AT NIGHT

## 2020-08-14 NOTE — Assessment & Plan Note (Signed)
Lab Results  Component Value Date   HGBA1C 5.6 08/08/2020   Stable, pt to continue current medical treatment  - diet

## 2020-08-14 NOTE — Assessment & Plan Note (Signed)
Last vitamin D Lab Results  Component Value Date   VD25OH 39.09 08/08/2020   Stable, cont oral replacement

## 2020-08-14 NOTE — Assessment & Plan Note (Signed)
Age and sex appropriate education and counseling updated with regular exercise and diet Referrals for preventative services - none needed Immunizations addressed - for pneumovax Smoking counseling  - counseled to quit Evidence for depression or other mood disorder - none significant Most recent labs reviewed. I have personally reviewed and have noted: 1) the patient's medical and social history 2) The patient's current medications and supplements 3) The patient's height, weight, and BMI have been recorded in the chart

## 2020-08-14 NOTE — Assessment & Plan Note (Signed)
Lab Results  Component Value Date   VITAMINB12 91 (L) 08/08/2020   Low, to start oral replacement - b12 1000 mcg qd

## 2020-08-14 NOTE — Assessment & Plan Note (Signed)
Exam benign, declines CT, cont to follow

## 2020-08-16 NOTE — Progress Notes (Signed)
I, Wendy Poet, LAT, ATC, am serving as scribe for Dr. [name].  [name] is a 65 y.o. female who presents to [place] [facility] today for f/u R shoulder pain ongoing since Oct w/ no known MOI. Pt was last seen by Dr. [name] on 04/27/20 and was given a steroid injection and advised to do HEP. Today, pt reports that her R shoulder is "painful" and feels same to worse as before.  She notes that the injection she had at her last appt helped for about one week.  Radiating pain: yes into her R upper arm R shoulder mechanical symptoms: no Aggravating factors: R shoulder functional IR, R shoulder flexion AROM >90 deg Treatments tried: Voltaren gel, Oxycodone through pain management  Patient does not drive and is effectively homebound.  If physical therapy indicated she needs home health physical therapy.  Dx imaging: 08/05/20 R shoulder MRI  04/27/20 R shoulder XR  Pertinent review of systems: No fevers or chills  Relevant historical information: Parkinson's   Exam:  BP 96/62 (BP Location: Left Arm, Patient Position: Sitting, Cuff Size: Normal)   Pulse 89   Ht 5\' 5"  (1.651 m)   Wt 141 lb 4.8 oz (64.1 kg)   SpO2 90%   BMI 23.51 kg/m  General: Well Developed, well nourished, and in no acute distress.   MSK: Right shoulder decreased muscle bulk.  Decreased range of motion.    Lab and Radiology Results  EXAM: MRI OF THE RIGHT SHOULDER WITHOUT CONTRAST  TECHNIQUE: Multiplanar, multisequence MR imaging of the shoulder was performed. No intravenous contrast was administered.  COMPARISON:  Radiographs 04/27/2020  FINDINGS: Rotator cuff: There is mild supraspinatus and infraspinatus tendinosis without tear. The subscapularis and teres minor tendons appear normal.  Muscles:  No focal muscular atrophy or edema.  Biceps long head: Intact and normally positioned. There is mild tendinosis of the intra-articular portion.  Acromioclavicular  Joint: The acromion is type 1. There are mild acromioclavicular degenerative changes. No significant fluid is present in the subacromial - subdeltoid bursa.  Glenohumeral Joint: Small shoulder joint effusion with mild synovial irregularity. Mild glenohumeral degenerative changes. No significant joint capsular thickening or edema.  Labrum:  No evidence of labral tear or paralabral cyst.  Bones: No acute or significant extra-articular osseous findings.  Other: No significant soft tissue findings.  IMPRESSION: 1. Mild supraspinatus and infraspinatus tendinosis. No evidence of rotator cuff tear. 2. Mild bicipital tendinosis. 3. Mild glenohumeral and acromioclavicular degenerative changes.   Electronically Signed   By: Richardean Sale M.D.   On: 08/07/2020 08:19 I, [name], personally (independently) visualized and performed the interpretation of the images attached in this note.  Procedure: Real-time Ultrasound Guided Injection of right shoulder glenohumeral joint posterior approach Device: Philips Affiniti 50G Images permanently stored and available for review in PACS Verbal informed consent obtained.  Discussed risks and benefits of procedure. Warned about infection bleeding damage to structures skin hypopigmentation and fat atrophy among others. Patient expresses understanding and agreement Time-out conducted.   Noted no overlying erythema, induration, or other signs of local infection.   Skin prepped in a sterile fashion.   Local anesthesia: Topical Ethyl chloride.   With sterile technique and under real time ultrasound guidance:  40 mg of Kenalog and 2 mL of Marcaine injected into right shoulder. Fluid seen entering the shoulder joint.   Completed without difficulty   Pain moderately resolved suggesting accurate placement of the medication.   Advised to  call if fevers/chills, erythema, induration, drainage, or persistent bleeding.   Images permanently stored and  available for review in the ultrasound unit.  Impression: Technically successful ultrasound guided injection.        Assessment and Plan: 65 y.o. female with chronic right shoulder pain not improving with typical conservative management over the last several months.  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  Several months ago she had subacromial injection that did not help.  Additionally she had a partial trial of outpatient PT.  Discussed options today.  She is not a good surgical candidate due to her overall other health issues.  Plan for glenohumeral injection and trial of home health physical therapy.  Recheck in 2 months.  If just not better would consider surgery however would like to reserve that for much downstream   PDMP not reviewed this encounter. Orders Placed This Encounter  Procedures  . [place] LIMITED JOINT SPACE STRUCTURES UP RIGHT(NO LINKED CHARGES)    Order Specific Question:   Reason for Exam (SYMPTOM  OR DIAGNOSIS REQUIRED)    Answer:   R shoulder pain    Order Specific Question:   Preferred imaging location?    Answer:   [facility]  . Ambulatory referral to Home Health    Referral Priority:   Routine    Referral Type:   Home Health Care    Referral Reason:   Specialty Services Required    Requested Specialty:   [place]    Number of Visits Requested:   1   No orders of the defined types were placed in this encounter.    Discussed warning signs or symptoms. Please see discharge instructions. Patient expresses understanding.   The above documentation has been reviewed and is accurate and complete [name], M.D.

## 2020-08-17 ENCOUNTER — Ambulatory Visit: Payer: Medicare Other | Admitting: Family Medicine

## 2020-08-17 ENCOUNTER — Encounter: Payer: Self-pay | Admitting: Family Medicine

## 2020-08-17 ENCOUNTER — Ambulatory Visit: Payer: Self-pay

## 2020-08-17 ENCOUNTER — Other Ambulatory Visit: Payer: Self-pay

## 2020-08-17 VITALS — BP 96/62 | HR 89 | Ht 65.0 in | Wt 141.3 lb

## 2020-08-17 DIAGNOSIS — M25511 Pain in right shoulder: Secondary | ICD-10-CM

## 2020-08-17 DIAGNOSIS — G8929 Other chronic pain: Secondary | ICD-10-CM

## 2020-08-17 NOTE — Patient Instructions (Addendum)
Thank you for coming in today.  I've referred you to Physical Therapy.  Let us know if you don't hear from them in one week.  Recheck in 2 months.   Call or go to the ER if you develop a large red swollen joint with extreme pain or oozing puss.

## 2020-08-21 DIAGNOSIS — Z79891 Long term (current) use of opiate analgesic: Secondary | ICD-10-CM | POA: Diagnosis not present

## 2020-08-21 DIAGNOSIS — M542 Cervicalgia: Secondary | ICD-10-CM | POA: Diagnosis not present

## 2020-08-21 DIAGNOSIS — M961 Postlaminectomy syndrome, not elsewhere classified: Secondary | ICD-10-CM | POA: Diagnosis not present

## 2020-08-21 DIAGNOSIS — Z1231 Encounter for screening mammogram for malignant neoplasm of breast: Secondary | ICD-10-CM | POA: Diagnosis not present

## 2020-08-21 DIAGNOSIS — M4726 Other spondylosis with radiculopathy, lumbar region: Secondary | ICD-10-CM | POA: Diagnosis not present

## 2020-08-21 DIAGNOSIS — M5136 Other intervertebral disc degeneration, lumbar region: Secondary | ICD-10-CM | POA: Diagnosis not present

## 2020-08-28 DIAGNOSIS — M19011 Primary osteoarthritis, right shoulder: Secondary | ICD-10-CM | POA: Diagnosis not present

## 2020-08-28 DIAGNOSIS — G8929 Other chronic pain: Secondary | ICD-10-CM | POA: Diagnosis not present

## 2020-08-28 DIAGNOSIS — K219 Gastro-esophageal reflux disease without esophagitis: Secondary | ICD-10-CM | POA: Diagnosis not present

## 2020-08-28 DIAGNOSIS — J449 Chronic obstructive pulmonary disease, unspecified: Secondary | ICD-10-CM | POA: Diagnosis not present

## 2020-08-28 DIAGNOSIS — G43709 Chronic migraine without aura, not intractable, without status migrainosus: Secondary | ICD-10-CM | POA: Diagnosis not present

## 2020-08-28 DIAGNOSIS — M67813 Other specified disorders of tendon, right shoulder: Secondary | ICD-10-CM | POA: Diagnosis not present

## 2020-08-28 DIAGNOSIS — G2119 Other drug induced secondary parkinsonism: Secondary | ICD-10-CM | POA: Diagnosis not present

## 2020-08-28 DIAGNOSIS — I671 Cerebral aneurysm, nonruptured: Secondary | ICD-10-CM | POA: Diagnosis not present

## 2020-08-28 DIAGNOSIS — M797 Fibromyalgia: Secondary | ICD-10-CM | POA: Diagnosis not present

## 2020-08-28 DIAGNOSIS — G629 Polyneuropathy, unspecified: Secondary | ICD-10-CM | POA: Diagnosis not present

## 2020-08-28 DIAGNOSIS — E785 Hyperlipidemia, unspecified: Secondary | ICD-10-CM | POA: Diagnosis not present

## 2020-08-28 DIAGNOSIS — G4700 Insomnia, unspecified: Secondary | ICD-10-CM | POA: Diagnosis not present

## 2020-08-28 DIAGNOSIS — M7521 Bicipital tendinitis, right shoulder: Secondary | ICD-10-CM | POA: Diagnosis not present

## 2020-08-28 DIAGNOSIS — E559 Vitamin D deficiency, unspecified: Secondary | ICD-10-CM | POA: Diagnosis not present

## 2020-08-28 DIAGNOSIS — E538 Deficiency of other specified B group vitamins: Secondary | ICD-10-CM | POA: Diagnosis not present

## 2020-08-28 DIAGNOSIS — K5909 Other constipation: Secondary | ICD-10-CM | POA: Diagnosis not present

## 2020-08-28 DIAGNOSIS — K589 Irritable bowel syndrome without diarrhea: Secondary | ICD-10-CM | POA: Diagnosis not present

## 2020-08-28 DIAGNOSIS — F17200 Nicotine dependence, unspecified, uncomplicated: Secondary | ICD-10-CM | POA: Diagnosis not present

## 2020-08-28 DIAGNOSIS — H6983 Other specified disorders of Eustachian tube, bilateral: Secondary | ICD-10-CM | POA: Diagnosis not present

## 2020-08-28 DIAGNOSIS — M25461 Effusion, right knee: Secondary | ICD-10-CM | POA: Diagnosis not present

## 2020-08-28 DIAGNOSIS — R1310 Dysphagia, unspecified: Secondary | ICD-10-CM | POA: Diagnosis not present

## 2020-09-05 DIAGNOSIS — H6983 Other specified disorders of Eustachian tube, bilateral: Secondary | ICD-10-CM | POA: Diagnosis not present

## 2020-09-05 DIAGNOSIS — G629 Polyneuropathy, unspecified: Secondary | ICD-10-CM | POA: Diagnosis not present

## 2020-09-05 DIAGNOSIS — K5909 Other constipation: Secondary | ICD-10-CM | POA: Diagnosis not present

## 2020-09-05 DIAGNOSIS — G4700 Insomnia, unspecified: Secondary | ICD-10-CM | POA: Diagnosis not present

## 2020-09-05 DIAGNOSIS — J449 Chronic obstructive pulmonary disease, unspecified: Secondary | ICD-10-CM | POA: Diagnosis not present

## 2020-09-05 DIAGNOSIS — M797 Fibromyalgia: Secondary | ICD-10-CM | POA: Diagnosis not present

## 2020-09-05 DIAGNOSIS — K589 Irritable bowel syndrome without diarrhea: Secondary | ICD-10-CM | POA: Diagnosis not present

## 2020-09-05 DIAGNOSIS — F17200 Nicotine dependence, unspecified, uncomplicated: Secondary | ICD-10-CM | POA: Diagnosis not present

## 2020-09-05 DIAGNOSIS — E559 Vitamin D deficiency, unspecified: Secondary | ICD-10-CM | POA: Diagnosis not present

## 2020-09-05 DIAGNOSIS — I671 Cerebral aneurysm, nonruptured: Secondary | ICD-10-CM | POA: Diagnosis not present

## 2020-09-05 DIAGNOSIS — E538 Deficiency of other specified B group vitamins: Secondary | ICD-10-CM | POA: Diagnosis not present

## 2020-09-05 DIAGNOSIS — G8929 Other chronic pain: Secondary | ICD-10-CM | POA: Diagnosis not present

## 2020-09-05 DIAGNOSIS — M19011 Primary osteoarthritis, right shoulder: Secondary | ICD-10-CM | POA: Diagnosis not present

## 2020-09-05 DIAGNOSIS — M67813 Other specified disorders of tendon, right shoulder: Secondary | ICD-10-CM | POA: Diagnosis not present

## 2020-09-05 DIAGNOSIS — E785 Hyperlipidemia, unspecified: Secondary | ICD-10-CM | POA: Diagnosis not present

## 2020-09-05 DIAGNOSIS — G43709 Chronic migraine without aura, not intractable, without status migrainosus: Secondary | ICD-10-CM | POA: Diagnosis not present

## 2020-09-05 DIAGNOSIS — M7521 Bicipital tendinitis, right shoulder: Secondary | ICD-10-CM | POA: Diagnosis not present

## 2020-09-05 DIAGNOSIS — K219 Gastro-esophageal reflux disease without esophagitis: Secondary | ICD-10-CM | POA: Diagnosis not present

## 2020-09-05 DIAGNOSIS — M25461 Effusion, right knee: Secondary | ICD-10-CM | POA: Diagnosis not present

## 2020-09-05 DIAGNOSIS — G2119 Other drug induced secondary parkinsonism: Secondary | ICD-10-CM | POA: Diagnosis not present

## 2020-09-05 DIAGNOSIS — R1310 Dysphagia, unspecified: Secondary | ICD-10-CM | POA: Diagnosis not present

## 2020-09-07 DIAGNOSIS — E785 Hyperlipidemia, unspecified: Secondary | ICD-10-CM | POA: Diagnosis not present

## 2020-09-07 DIAGNOSIS — F17200 Nicotine dependence, unspecified, uncomplicated: Secondary | ICD-10-CM | POA: Diagnosis not present

## 2020-09-07 DIAGNOSIS — E538 Deficiency of other specified B group vitamins: Secondary | ICD-10-CM | POA: Diagnosis not present

## 2020-09-07 DIAGNOSIS — G629 Polyneuropathy, unspecified: Secondary | ICD-10-CM | POA: Diagnosis not present

## 2020-09-07 DIAGNOSIS — M67813 Other specified disorders of tendon, right shoulder: Secondary | ICD-10-CM | POA: Diagnosis not present

## 2020-09-07 DIAGNOSIS — J449 Chronic obstructive pulmonary disease, unspecified: Secondary | ICD-10-CM | POA: Diagnosis not present

## 2020-09-07 DIAGNOSIS — G8929 Other chronic pain: Secondary | ICD-10-CM | POA: Diagnosis not present

## 2020-09-07 DIAGNOSIS — H6983 Other specified disorders of Eustachian tube, bilateral: Secondary | ICD-10-CM | POA: Diagnosis not present

## 2020-09-07 DIAGNOSIS — G43709 Chronic migraine without aura, not intractable, without status migrainosus: Secondary | ICD-10-CM | POA: Diagnosis not present

## 2020-09-07 DIAGNOSIS — K5909 Other constipation: Secondary | ICD-10-CM | POA: Diagnosis not present

## 2020-09-07 DIAGNOSIS — K219 Gastro-esophageal reflux disease without esophagitis: Secondary | ICD-10-CM | POA: Diagnosis not present

## 2020-09-07 DIAGNOSIS — M797 Fibromyalgia: Secondary | ICD-10-CM | POA: Diagnosis not present

## 2020-09-07 DIAGNOSIS — I671 Cerebral aneurysm, nonruptured: Secondary | ICD-10-CM | POA: Diagnosis not present

## 2020-09-07 DIAGNOSIS — G4700 Insomnia, unspecified: Secondary | ICD-10-CM | POA: Diagnosis not present

## 2020-09-07 DIAGNOSIS — M19011 Primary osteoarthritis, right shoulder: Secondary | ICD-10-CM | POA: Diagnosis not present

## 2020-09-07 DIAGNOSIS — M7521 Bicipital tendinitis, right shoulder: Secondary | ICD-10-CM | POA: Diagnosis not present

## 2020-09-07 DIAGNOSIS — G2119 Other drug induced secondary parkinsonism: Secondary | ICD-10-CM | POA: Diagnosis not present

## 2020-09-07 DIAGNOSIS — E559 Vitamin D deficiency, unspecified: Secondary | ICD-10-CM | POA: Diagnosis not present

## 2020-09-07 DIAGNOSIS — K589 Irritable bowel syndrome without diarrhea: Secondary | ICD-10-CM | POA: Diagnosis not present

## 2020-09-07 DIAGNOSIS — M25461 Effusion, right knee: Secondary | ICD-10-CM | POA: Diagnosis not present

## 2020-09-07 DIAGNOSIS — R1310 Dysphagia, unspecified: Secondary | ICD-10-CM | POA: Diagnosis not present

## 2020-09-11 DIAGNOSIS — K589 Irritable bowel syndrome without diarrhea: Secondary | ICD-10-CM | POA: Diagnosis not present

## 2020-09-11 DIAGNOSIS — G629 Polyneuropathy, unspecified: Secondary | ICD-10-CM | POA: Diagnosis not present

## 2020-09-11 DIAGNOSIS — K219 Gastro-esophageal reflux disease without esophagitis: Secondary | ICD-10-CM | POA: Diagnosis not present

## 2020-09-11 DIAGNOSIS — E785 Hyperlipidemia, unspecified: Secondary | ICD-10-CM | POA: Diagnosis not present

## 2020-09-11 DIAGNOSIS — M7521 Bicipital tendinitis, right shoulder: Secondary | ICD-10-CM | POA: Diagnosis not present

## 2020-09-11 DIAGNOSIS — F17200 Nicotine dependence, unspecified, uncomplicated: Secondary | ICD-10-CM | POA: Diagnosis not present

## 2020-09-11 DIAGNOSIS — E559 Vitamin D deficiency, unspecified: Secondary | ICD-10-CM | POA: Diagnosis not present

## 2020-09-11 DIAGNOSIS — G43709 Chronic migraine without aura, not intractable, without status migrainosus: Secondary | ICD-10-CM | POA: Diagnosis not present

## 2020-09-11 DIAGNOSIS — G8929 Other chronic pain: Secondary | ICD-10-CM | POA: Diagnosis not present

## 2020-09-11 DIAGNOSIS — G4700 Insomnia, unspecified: Secondary | ICD-10-CM | POA: Diagnosis not present

## 2020-09-11 DIAGNOSIS — M797 Fibromyalgia: Secondary | ICD-10-CM | POA: Diagnosis not present

## 2020-09-11 DIAGNOSIS — K5909 Other constipation: Secondary | ICD-10-CM | POA: Diagnosis not present

## 2020-09-11 DIAGNOSIS — I671 Cerebral aneurysm, nonruptured: Secondary | ICD-10-CM | POA: Diagnosis not present

## 2020-09-11 DIAGNOSIS — M67813 Other specified disorders of tendon, right shoulder: Secondary | ICD-10-CM | POA: Diagnosis not present

## 2020-09-11 DIAGNOSIS — G2119 Other drug induced secondary parkinsonism: Secondary | ICD-10-CM | POA: Diagnosis not present

## 2020-09-11 DIAGNOSIS — E538 Deficiency of other specified B group vitamins: Secondary | ICD-10-CM | POA: Diagnosis not present

## 2020-09-11 DIAGNOSIS — M19011 Primary osteoarthritis, right shoulder: Secondary | ICD-10-CM | POA: Diagnosis not present

## 2020-09-11 DIAGNOSIS — J449 Chronic obstructive pulmonary disease, unspecified: Secondary | ICD-10-CM | POA: Diagnosis not present

## 2020-09-11 DIAGNOSIS — R1310 Dysphagia, unspecified: Secondary | ICD-10-CM | POA: Diagnosis not present

## 2020-09-11 DIAGNOSIS — M25461 Effusion, right knee: Secondary | ICD-10-CM | POA: Diagnosis not present

## 2020-09-11 DIAGNOSIS — H6983 Other specified disorders of Eustachian tube, bilateral: Secondary | ICD-10-CM | POA: Diagnosis not present

## 2020-09-13 DIAGNOSIS — M19011 Primary osteoarthritis, right shoulder: Secondary | ICD-10-CM | POA: Diagnosis not present

## 2020-09-13 DIAGNOSIS — M25461 Effusion, right knee: Secondary | ICD-10-CM | POA: Diagnosis not present

## 2020-09-13 DIAGNOSIS — I671 Cerebral aneurysm, nonruptured: Secondary | ICD-10-CM | POA: Diagnosis not present

## 2020-09-13 DIAGNOSIS — R1310 Dysphagia, unspecified: Secondary | ICD-10-CM | POA: Diagnosis not present

## 2020-09-13 DIAGNOSIS — G43709 Chronic migraine without aura, not intractable, without status migrainosus: Secondary | ICD-10-CM | POA: Diagnosis not present

## 2020-09-13 DIAGNOSIS — K5909 Other constipation: Secondary | ICD-10-CM | POA: Diagnosis not present

## 2020-09-13 DIAGNOSIS — K219 Gastro-esophageal reflux disease without esophagitis: Secondary | ICD-10-CM | POA: Diagnosis not present

## 2020-09-13 DIAGNOSIS — F17200 Nicotine dependence, unspecified, uncomplicated: Secondary | ICD-10-CM | POA: Diagnosis not present

## 2020-09-13 DIAGNOSIS — E538 Deficiency of other specified B group vitamins: Secondary | ICD-10-CM | POA: Diagnosis not present

## 2020-09-13 DIAGNOSIS — G4700 Insomnia, unspecified: Secondary | ICD-10-CM | POA: Diagnosis not present

## 2020-09-13 DIAGNOSIS — E785 Hyperlipidemia, unspecified: Secondary | ICD-10-CM | POA: Diagnosis not present

## 2020-09-13 DIAGNOSIS — G629 Polyneuropathy, unspecified: Secondary | ICD-10-CM | POA: Diagnosis not present

## 2020-09-13 DIAGNOSIS — G2119 Other drug induced secondary parkinsonism: Secondary | ICD-10-CM | POA: Diagnosis not present

## 2020-09-13 DIAGNOSIS — M797 Fibromyalgia: Secondary | ICD-10-CM | POA: Diagnosis not present

## 2020-09-13 DIAGNOSIS — E559 Vitamin D deficiency, unspecified: Secondary | ICD-10-CM | POA: Diagnosis not present

## 2020-09-13 DIAGNOSIS — M7521 Bicipital tendinitis, right shoulder: Secondary | ICD-10-CM | POA: Diagnosis not present

## 2020-09-13 DIAGNOSIS — K589 Irritable bowel syndrome without diarrhea: Secondary | ICD-10-CM | POA: Diagnosis not present

## 2020-09-13 DIAGNOSIS — H6983 Other specified disorders of Eustachian tube, bilateral: Secondary | ICD-10-CM | POA: Diagnosis not present

## 2020-09-13 DIAGNOSIS — G8929 Other chronic pain: Secondary | ICD-10-CM | POA: Diagnosis not present

## 2020-09-13 DIAGNOSIS — M67813 Other specified disorders of tendon, right shoulder: Secondary | ICD-10-CM | POA: Diagnosis not present

## 2020-09-13 DIAGNOSIS — J449 Chronic obstructive pulmonary disease, unspecified: Secondary | ICD-10-CM | POA: Diagnosis not present

## 2020-09-14 DIAGNOSIS — M791 Myalgia, unspecified site: Secondary | ICD-10-CM | POA: Diagnosis not present

## 2020-09-14 DIAGNOSIS — Z79891 Long term (current) use of opiate analgesic: Secondary | ICD-10-CM | POA: Diagnosis not present

## 2020-09-18 ENCOUNTER — Ambulatory Visit (INDEPENDENT_AMBULATORY_CARE_PROVIDER_SITE_OTHER): Payer: Medicare Other | Admitting: Physician Assistant

## 2020-09-18 ENCOUNTER — Other Ambulatory Visit: Payer: Self-pay

## 2020-09-18 ENCOUNTER — Encounter: Payer: Self-pay | Admitting: Physician Assistant

## 2020-09-18 DIAGNOSIS — I671 Cerebral aneurysm, nonruptured: Secondary | ICD-10-CM | POA: Diagnosis not present

## 2020-09-18 DIAGNOSIS — M19011 Primary osteoarthritis, right shoulder: Secondary | ICD-10-CM | POA: Diagnosis not present

## 2020-09-18 DIAGNOSIS — R251 Tremor, unspecified: Secondary | ICD-10-CM | POA: Diagnosis not present

## 2020-09-18 DIAGNOSIS — M7521 Bicipital tendinitis, right shoulder: Secondary | ICD-10-CM | POA: Diagnosis not present

## 2020-09-18 DIAGNOSIS — F259 Schizoaffective disorder, unspecified: Secondary | ICD-10-CM | POA: Diagnosis not present

## 2020-09-18 DIAGNOSIS — H6983 Other specified disorders of Eustachian tube, bilateral: Secondary | ICD-10-CM | POA: Diagnosis not present

## 2020-09-18 DIAGNOSIS — G8929 Other chronic pain: Secondary | ICD-10-CM | POA: Diagnosis not present

## 2020-09-18 DIAGNOSIS — F411 Generalized anxiety disorder: Secondary | ICD-10-CM | POA: Diagnosis not present

## 2020-09-18 DIAGNOSIS — F331 Major depressive disorder, recurrent, moderate: Secondary | ICD-10-CM

## 2020-09-18 DIAGNOSIS — K589 Irritable bowel syndrome without diarrhea: Secondary | ICD-10-CM | POA: Diagnosis not present

## 2020-09-18 DIAGNOSIS — E785 Hyperlipidemia, unspecified: Secondary | ICD-10-CM | POA: Diagnosis not present

## 2020-09-18 DIAGNOSIS — K219 Gastro-esophageal reflux disease without esophagitis: Secondary | ICD-10-CM | POA: Diagnosis not present

## 2020-09-18 DIAGNOSIS — G629 Polyneuropathy, unspecified: Secondary | ICD-10-CM | POA: Diagnosis not present

## 2020-09-18 DIAGNOSIS — G43709 Chronic migraine without aura, not intractable, without status migrainosus: Secondary | ICD-10-CM | POA: Diagnosis not present

## 2020-09-18 DIAGNOSIS — M25461 Effusion, right knee: Secondary | ICD-10-CM | POA: Diagnosis not present

## 2020-09-18 DIAGNOSIS — F17200 Nicotine dependence, unspecified, uncomplicated: Secondary | ICD-10-CM | POA: Diagnosis not present

## 2020-09-18 DIAGNOSIS — M67813 Other specified disorders of tendon, right shoulder: Secondary | ICD-10-CM | POA: Diagnosis not present

## 2020-09-18 DIAGNOSIS — G4700 Insomnia, unspecified: Secondary | ICD-10-CM | POA: Diagnosis not present

## 2020-09-18 DIAGNOSIS — R1310 Dysphagia, unspecified: Secondary | ICD-10-CM | POA: Diagnosis not present

## 2020-09-18 DIAGNOSIS — E538 Deficiency of other specified B group vitamins: Secondary | ICD-10-CM | POA: Diagnosis not present

## 2020-09-18 DIAGNOSIS — K5909 Other constipation: Secondary | ICD-10-CM | POA: Diagnosis not present

## 2020-09-18 DIAGNOSIS — E559 Vitamin D deficiency, unspecified: Secondary | ICD-10-CM | POA: Diagnosis not present

## 2020-09-18 DIAGNOSIS — G2119 Other drug induced secondary parkinsonism: Secondary | ICD-10-CM | POA: Diagnosis not present

## 2020-09-18 DIAGNOSIS — J449 Chronic obstructive pulmonary disease, unspecified: Secondary | ICD-10-CM | POA: Diagnosis not present

## 2020-09-18 DIAGNOSIS — M797 Fibromyalgia: Secondary | ICD-10-CM | POA: Diagnosis not present

## 2020-09-18 MED ORDER — BUPROPION HCL ER (XL) 150 MG PO TB24: 150.0000 mg | ORAL_TABLET | Freq: Every day | ORAL | 1 refills | Status: DC

## 2020-09-18 NOTE — Progress Notes (Signed)
[facility]  Patient ID: [name],  MRN: [id number]  PCP: Lori Borg, MD  Date of Evaluation: 09/18/2020 Time spent:40 minutes  Chief Complaint:  Chief Complaint    Anxiety; Depression; Insomnia; Follow-up      HISTORY/CURRENT STATUS: HPI For routine med check.  Her friend [name] is with her today.  Spends a lot of time in bed or in her bedroom.  She has a lot of back pain and it helps that to lay down.  States she is depressed somewhat.  Does not want to do much of anything.  She did enjoy going over to her son's to spend the night this past weekend.  Her friend [name] goes to her house often and they hang out together.  Does not cry easily.  No suicidal or homicidal thoughts.  Tremor in her hands is better today.  The majority of the time she has a lot of shaking.  She has learned to live with it.  She is going to be quitting smoking.  April 7 is her quit date.  She smoked 2 packs yesterday.  She called a 'quit now' program and will have a coach and they are sending [name].  She is excited about quitting.  Patient denies increased energy with decreased need for sleep, no increased talkativeness, no racing thoughts, no impulsivity or risky behaviors, no increased spending, no increased libido, no grandiosity, no increased irritability or anger, and no hallucinations.  Denies dizziness, syncope, seizures, numbness, tingling, tics, unsteady gait, slurred speech, confusion. Denies muscle or joint pain, stiffness, or dystonia.  Individual Medical History/ Review of Systems: Changes? :No    Past medications for mental health diagnoses include: Ingrezza was ineffective, Lamictal, Risperdal, Zyprexa, Cogentin, Latuda, Cymbalta, lithium, Ambien, prazosin, Ativan, Wellbutrin, Zoloft, and probably others  Allergies: Ambien [zolpidem tartrate], Hydrocodone, Lactose intolerance (gi), Nsaids, Propoxyphene n-acetaminophen, Tramadol, and Chantix [varenicline]  Current  Medications:  Current Outpatient Medications:  .  aspirin 325 MG tablet, Take 325 mg by mouth daily., Disp: , Rfl:  .  atorvastatin (LIPITOR) 40 MG tablet, TAKE 1 TABLET BY MOUTH  DAILY, Disp: 90 tablet, Rfl: 3 .  buPROPion (WELLBUTRIN XL) 150 MG 24 hr tablet, Take 1 tablet (150 mg total) by mouth daily. Take 1 po with 300 mg= 450 mg/day., Disp: 90 tablet, Rfl: 1 .  buPROPion (WELLBUTRIN XL) 300 MG 24 hr tablet, Take 1 tablet (300 mg total) by mouth daily., Disp: 90 tablet, Rfl: 1 .  Cholecalciferol (VITAMIN D) 50 MCG (2000 UT) tablet, Take 2,000 Units by mouth daily., Disp: , Rfl:  .  gabapentin (NEURONTIN) 800 MG tablet, TAKE 1 TABLET 3 TIMES  DAILY THEREAFTER (Patient taking differently: Take 800 mg by mouth 3 (three) times daily.), Disp: 270 tablet, Rfl: 3 .  lamoTRIgine (LAMICTAL) 100 MG tablet, TAKE 1 TABLET BY MOUTH IN  THE MORNING AND 3 TABLETS  BY MOUTH AT BEDTIME, Disp: 360 tablet, Rfl: 3 .  naloxone (NARCAN) 4 MG/0.1ML LIQD nasal spray kit, Place 0.4 mg into the nose as needed (opioid overdose). , Disp: , Rfl:  .  Oxycodone HCl 20 MG TABS, Take 1 tablet by mouth every 6 (six) hours as needed (pain). , Disp: , Rfl:  .  pantoprazole (PROTONIX) 40 MG tablet, Take 40 mg by mouth 2 (two) times daily., Disp: , Rfl:  .  potassium chloride (KLOR-CON) 10 MEQ tablet, TAKE 1 TABLET BY MOUTH  DAILY, Disp: 90 tablet, Rfl: 3 .  promethazine (PHENERGAN) 12.5  MG tablet, Take 1 tablet by mouth  every 6 hours as needed for nausea (Patient taking differently: Take 12.5 mg by mouth every 6 (six) hours as needed for nausea.), Disp: 90 tablet, Rfl: 0 .  risperidone (RISPERDAL) 4 MG tablet, TAKE 1 TABLET BY MOUTH IN  THE MORNING THEN 1 AND 1/2  TABLETS BY MOUTH AT NIGHT, Disp: 225 tablet, Rfl: 3 .  vitamin B-12 (CYANOCOBALAMIN) 1000 MCG tablet, Take 1 tablet (1,000 mcg total) by mouth daily., Disp: 90 tablet, Rfl: 3 .  fexofenadine (ALLEGRA) 180 MG tablet, Take 180 mg by mouth daily. (Patient not taking:  Reported on 09/18/2020), Disp: , Rfl:  .  OLANZapine (ZYPREXA) 10 MG tablet, 15 mg daily for 1 week, 10 mg daily for 1 week then 5 mg daily for 1 week and then stop., Disp: 180 tablet, Rfl: 1 Medication Side Effects: Tremor   Family Medical/ Social History: Changes? No  MENTAL HEALTH EXAM:  There were no vitals taken for this visit.There is no height or weight on file to calculate BMI.  General Appearance: Casual, Neat and Well Groomed  Eye Contact:  Fair  Speech:  Clear and Coherent and Normal Rate  Volume:  Normal  Mood:  Sad  Affect:  Congruent  Thought Process:  Goal Directed and Descriptions of Associations: Intact  Orientation:  Full (Time, Place, and Person)  Thought Content: Logical   Suicidal Thoughts:  No  Homicidal Thoughts:  No  Memory:  Recent;   Fair  Judgement:  Good  Insight:  Good  Psychomotor Activity:  Gait is normal.  Minimal tremor in her left arm at rest.  Concentration:  Concentration: Good  Recall:  Good  Fund of Knowledge: Good  Language: Good  Assets:  Desire for Improvement  ADL's:  Intact  Cognition: WNL  Prognosis:  Good   Most recent labs : CBC WBC 5.8, hemoglobin 15.8, hematocrit 46.2, platelets 248 CMP glucose 88 hemoglobin A1c 5.6 Lipid panel total cholesterol 162, triglycerides 72, HDL 54, LDL 94  DIAGNOSES:    ICD-10-CM   1. Schizoaffective disorder, unspecified type (HCC)  F25.9   2. Generalized anxiety disorder  F41.1   3. Smoker  F17.200   4. Tremor of both hands  R25.1   5. Moderate episode of recurrent major depressive disorder ([place])  F33.1     Receiving Psychotherapy: No    RECOMMENDATIONS:  PDMP reviewed. I provided 40 minutes of face-to-face time during this encounter, including time spent before and after the visit in records review and charting. We discussed the depression that seems to have gotten worse over the past month or so.  I recommend increasing the Wellbutrin as that will help with mood, energy, motivation, and  smoking cessation hopefully.  She would like to increase it. Brief counseling on smoking cessation given. Her friend [name] brings up maybe decreasing the Risperdal, that [name] has mentioned that to her.  She is not having any psychotic symptoms and has not had for a while.  She is on a pretty high dose of Risperdal and it would be nice if we can get her to a lower effective dose.  However I do not want to change that today since I am increasing the Wellbutrin.  We will address the Risperdal at the next visit.  Patient and her friend agree and accept. Increase Wellbutrin XL to a total of 450 mg p.o. daily.  She already has the 300 mg so prescription sent for 150 mg, take 1  with the 300 mg daily. Continue Risperdal 4 mg, 1 p.o. every morning, 1.5 p.o. nightly. Continue gabapentin 800 mg, 1 p.o. 3 times daily. Continue Lamictal 100 mg, 1 p.o. every morning and 3 p.o. nightly. Return in 2 months.  Donnal Moat, PA-C

## 2020-09-27 DIAGNOSIS — M7521 Bicipital tendinitis, right shoulder: Secondary | ICD-10-CM | POA: Diagnosis not present

## 2020-09-27 DIAGNOSIS — J449 Chronic obstructive pulmonary disease, unspecified: Secondary | ICD-10-CM | POA: Diagnosis not present

## 2020-09-27 DIAGNOSIS — G43709 Chronic migraine without aura, not intractable, without status migrainosus: Secondary | ICD-10-CM | POA: Diagnosis not present

## 2020-09-27 DIAGNOSIS — M19011 Primary osteoarthritis, right shoulder: Secondary | ICD-10-CM | POA: Diagnosis not present

## 2020-09-27 DIAGNOSIS — E785 Hyperlipidemia, unspecified: Secondary | ICD-10-CM | POA: Diagnosis not present

## 2020-09-27 DIAGNOSIS — G629 Polyneuropathy, unspecified: Secondary | ICD-10-CM | POA: Diagnosis not present

## 2020-09-27 DIAGNOSIS — E559 Vitamin D deficiency, unspecified: Secondary | ICD-10-CM | POA: Diagnosis not present

## 2020-09-27 DIAGNOSIS — E538 Deficiency of other specified B group vitamins: Secondary | ICD-10-CM | POA: Diagnosis not present

## 2020-09-27 DIAGNOSIS — F17200 Nicotine dependence, unspecified, uncomplicated: Secondary | ICD-10-CM | POA: Diagnosis not present

## 2020-09-27 DIAGNOSIS — M25461 Effusion, right knee: Secondary | ICD-10-CM | POA: Diagnosis not present

## 2020-09-27 DIAGNOSIS — G2119 Other drug induced secondary parkinsonism: Secondary | ICD-10-CM | POA: Diagnosis not present

## 2020-09-27 DIAGNOSIS — G4700 Insomnia, unspecified: Secondary | ICD-10-CM | POA: Diagnosis not present

## 2020-09-27 DIAGNOSIS — K219 Gastro-esophageal reflux disease without esophagitis: Secondary | ICD-10-CM | POA: Diagnosis not present

## 2020-09-27 DIAGNOSIS — M797 Fibromyalgia: Secondary | ICD-10-CM | POA: Diagnosis not present

## 2020-09-27 DIAGNOSIS — G8929 Other chronic pain: Secondary | ICD-10-CM | POA: Diagnosis not present

## 2020-09-27 DIAGNOSIS — H6983 Other specified disorders of Eustachian tube, bilateral: Secondary | ICD-10-CM | POA: Diagnosis not present

## 2020-09-27 DIAGNOSIS — M67813 Other specified disorders of tendon, right shoulder: Secondary | ICD-10-CM | POA: Diagnosis not present

## 2020-09-27 DIAGNOSIS — K5909 Other constipation: Secondary | ICD-10-CM | POA: Diagnosis not present

## 2020-09-27 DIAGNOSIS — K589 Irritable bowel syndrome without diarrhea: Secondary | ICD-10-CM | POA: Diagnosis not present

## 2020-09-27 DIAGNOSIS — I671 Cerebral aneurysm, nonruptured: Secondary | ICD-10-CM | POA: Diagnosis not present

## 2020-09-27 DIAGNOSIS — R1310 Dysphagia, unspecified: Secondary | ICD-10-CM | POA: Diagnosis not present

## 2020-10-04 DIAGNOSIS — G629 Polyneuropathy, unspecified: Secondary | ICD-10-CM | POA: Diagnosis not present

## 2020-10-04 DIAGNOSIS — M797 Fibromyalgia: Secondary | ICD-10-CM | POA: Diagnosis not present

## 2020-10-04 DIAGNOSIS — K219 Gastro-esophageal reflux disease without esophagitis: Secondary | ICD-10-CM | POA: Diagnosis not present

## 2020-10-04 DIAGNOSIS — I671 Cerebral aneurysm, nonruptured: Secondary | ICD-10-CM | POA: Diagnosis not present

## 2020-10-04 DIAGNOSIS — R1310 Dysphagia, unspecified: Secondary | ICD-10-CM | POA: Diagnosis not present

## 2020-10-04 DIAGNOSIS — G2119 Other drug induced secondary parkinsonism: Secondary | ICD-10-CM | POA: Diagnosis not present

## 2020-10-04 DIAGNOSIS — H6983 Other specified disorders of Eustachian tube, bilateral: Secondary | ICD-10-CM | POA: Diagnosis not present

## 2020-10-04 DIAGNOSIS — K5909 Other constipation: Secondary | ICD-10-CM | POA: Diagnosis not present

## 2020-10-04 DIAGNOSIS — M67813 Other specified disorders of tendon, right shoulder: Secondary | ICD-10-CM | POA: Diagnosis not present

## 2020-10-04 DIAGNOSIS — M7521 Bicipital tendinitis, right shoulder: Secondary | ICD-10-CM | POA: Diagnosis not present

## 2020-10-04 DIAGNOSIS — E785 Hyperlipidemia, unspecified: Secondary | ICD-10-CM | POA: Diagnosis not present

## 2020-10-04 DIAGNOSIS — K589 Irritable bowel syndrome without diarrhea: Secondary | ICD-10-CM | POA: Diagnosis not present

## 2020-10-04 DIAGNOSIS — F17200 Nicotine dependence, unspecified, uncomplicated: Secondary | ICD-10-CM | POA: Diagnosis not present

## 2020-10-04 DIAGNOSIS — G43709 Chronic migraine without aura, not intractable, without status migrainosus: Secondary | ICD-10-CM | POA: Diagnosis not present

## 2020-10-04 DIAGNOSIS — G8929 Other chronic pain: Secondary | ICD-10-CM | POA: Diagnosis not present

## 2020-10-04 DIAGNOSIS — J449 Chronic obstructive pulmonary disease, unspecified: Secondary | ICD-10-CM | POA: Diagnosis not present

## 2020-10-04 DIAGNOSIS — M25461 Effusion, right knee: Secondary | ICD-10-CM | POA: Diagnosis not present

## 2020-10-04 DIAGNOSIS — E559 Vitamin D deficiency, unspecified: Secondary | ICD-10-CM | POA: Diagnosis not present

## 2020-10-04 DIAGNOSIS — M19011 Primary osteoarthritis, right shoulder: Secondary | ICD-10-CM | POA: Diagnosis not present

## 2020-10-04 DIAGNOSIS — G4700 Insomnia, unspecified: Secondary | ICD-10-CM | POA: Diagnosis not present

## 2020-10-04 DIAGNOSIS — E538 Deficiency of other specified B group vitamins: Secondary | ICD-10-CM | POA: Diagnosis not present

## 2020-10-11 DIAGNOSIS — I671 Cerebral aneurysm, nonruptured: Secondary | ICD-10-CM | POA: Diagnosis not present

## 2020-10-11 DIAGNOSIS — M67813 Other specified disorders of tendon, right shoulder: Secondary | ICD-10-CM | POA: Diagnosis not present

## 2020-10-11 DIAGNOSIS — E538 Deficiency of other specified B group vitamins: Secondary | ICD-10-CM | POA: Diagnosis not present

## 2020-10-11 DIAGNOSIS — F17200 Nicotine dependence, unspecified, uncomplicated: Secondary | ICD-10-CM | POA: Diagnosis not present

## 2020-10-11 DIAGNOSIS — M7521 Bicipital tendinitis, right shoulder: Secondary | ICD-10-CM | POA: Diagnosis not present

## 2020-10-11 DIAGNOSIS — M797 Fibromyalgia: Secondary | ICD-10-CM | POA: Diagnosis not present

## 2020-10-11 DIAGNOSIS — J449 Chronic obstructive pulmonary disease, unspecified: Secondary | ICD-10-CM | POA: Diagnosis not present

## 2020-10-11 DIAGNOSIS — E785 Hyperlipidemia, unspecified: Secondary | ICD-10-CM | POA: Diagnosis not present

## 2020-10-11 DIAGNOSIS — R1310 Dysphagia, unspecified: Secondary | ICD-10-CM | POA: Diagnosis not present

## 2020-10-11 DIAGNOSIS — H6983 Other specified disorders of Eustachian tube, bilateral: Secondary | ICD-10-CM | POA: Diagnosis not present

## 2020-10-11 DIAGNOSIS — M25461 Effusion, right knee: Secondary | ICD-10-CM | POA: Diagnosis not present

## 2020-10-11 DIAGNOSIS — G43709 Chronic migraine without aura, not intractable, without status migrainosus: Secondary | ICD-10-CM | POA: Diagnosis not present

## 2020-10-11 DIAGNOSIS — G2119 Other drug induced secondary parkinsonism: Secondary | ICD-10-CM | POA: Diagnosis not present

## 2020-10-11 DIAGNOSIS — G8929 Other chronic pain: Secondary | ICD-10-CM | POA: Diagnosis not present

## 2020-10-11 DIAGNOSIS — K219 Gastro-esophageal reflux disease without esophagitis: Secondary | ICD-10-CM | POA: Diagnosis not present

## 2020-10-11 DIAGNOSIS — M19011 Primary osteoarthritis, right shoulder: Secondary | ICD-10-CM | POA: Diagnosis not present

## 2020-10-11 DIAGNOSIS — K5909 Other constipation: Secondary | ICD-10-CM | POA: Diagnosis not present

## 2020-10-11 DIAGNOSIS — E559 Vitamin D deficiency, unspecified: Secondary | ICD-10-CM | POA: Diagnosis not present

## 2020-10-11 DIAGNOSIS — G629 Polyneuropathy, unspecified: Secondary | ICD-10-CM | POA: Diagnosis not present

## 2020-10-11 DIAGNOSIS — G4700 Insomnia, unspecified: Secondary | ICD-10-CM | POA: Diagnosis not present

## 2020-10-11 DIAGNOSIS — K589 Irritable bowel syndrome without diarrhea: Secondary | ICD-10-CM | POA: Diagnosis not present

## 2020-10-16 NOTE — Progress Notes (Signed)
[name], am serving as a Education administrator for Dr. [name].  [name] is a 65 y.o. female who presents to [facility] today for f/u chronic R shoulder ongoing since Oct. Pt does not drive and is effectively homebound. Pt was last seen by Dr. [name] on 08/17/20 and was given a R Collins steroid injection and was referred to home health PT of which she's had a few visits. Patient states the pain is not as bad as the pain was at the first injection. Patient states that whenever she raises her arm and it pops it hurts pretty bad. Patient is now down to one time a week with PT. States sometimes the shoulder will hurt after PT.  Dx imaging: 08/05/20 R shoulder MRI             04/27/20 R shoulder XR  Pertinent review of systems: No fevers or chills  Relevant historical information: COPD.  Poor health   Exam:  BP 100/60 (BP Location: Left Arm, Patient Position: Sitting, Cuff Size: Normal)   Pulse 93   Ht 5\' 5"  (1.651 m)   Wt 126 lb (57.2 kg)   SpO2 93%   BMI 20.97 kg/m  General: Well Developed, well nourished, and in no acute distress.   MSK: Right shoulder normal-appearing normal motion with minimal pain with abduction.  Intact strength.    Lab and Radiology Results  EXAM: MRI OF THE RIGHT SHOULDER WITHOUT CONTRAST  TECHNIQUE: Multiplanar, multisequence MR imaging of the shoulder was performed. No intravenous contrast was administered.  COMPARISON:  Radiographs 04/27/2020  FINDINGS: Rotator cuff: There is mild supraspinatus and infraspinatus tendinosis without tear. The subscapularis and teres minor tendons appear normal.  Muscles:  No focal muscular atrophy or edema.  Biceps long head: Intact and normally positioned. There is mild tendinosis of the intra-articular portion.  Acromioclavicular Joint: The acromion is type 1. There are mild acromioclavicular degenerative changes. No significant fluid is present in the subacromial -  subdeltoid bursa.  Glenohumeral Joint: Small shoulder joint effusion with mild synovial irregularity. Mild glenohumeral degenerative changes. No significant joint capsular thickening or edema.  Labrum:  No evidence of labral tear or paralabral cyst.  Bones: No acute or significant extra-articular osseous findings.  Other: No significant soft tissue findings.  IMPRESSION: 1. Mild supraspinatus and infraspinatus tendinosis. No evidence of rotator cuff tear. 2. Mild bicipital tendinosis. 3. Mild glenohumeral and acromioclavicular degenerative changes.   Electronically Signed   By: Richardean Sale M.D.   On: 08/07/2020 08:19  I, [name], personally (independently) visualized and performed the interpretation of the images attached in this note.     Assessment and Plan: 65 y.o. female with right shoulder pain due to tendinopathy of the rotator cuff.  Significant improvement with home health physical therapy.  Additionally patient had glenohumeral injection at the last visit which also helped quite a bit.  She still has a little bit of pain but is overall much improved from last time.  Plan for continued home exercise program watchful waiting recheck back with me as needed.  She is not a good surgical candidate so I am very thankful that she is better.   Total encounter time 20 minutes including face-to-face time with the patient and, reviewing past medical record, and charting on the date of service.   Treatment plan and options  Discussed warning signs or symptoms. Please see discharge instructions. Patient expresses understanding.  The above documentation has been  reviewed and is accurate and complete [name], M.D.

## 2020-10-17 ENCOUNTER — Encounter: Payer: Self-pay | Admitting: Family Medicine

## 2020-10-17 ENCOUNTER — Other Ambulatory Visit: Payer: Self-pay

## 2020-10-17 ENCOUNTER — Ambulatory Visit: Payer: Medicare Other | Admitting: Family Medicine

## 2020-10-17 VITALS — BP 100/60 | HR 93 | Ht 65.0 in | Wt 126.0 lb

## 2020-10-17 DIAGNOSIS — M25511 Pain in right shoulder: Secondary | ICD-10-CM

## 2020-10-17 DIAGNOSIS — G8929 Other chronic pain: Secondary | ICD-10-CM

## 2020-10-17 NOTE — Patient Instructions (Signed)
Thank you for coming in today.  Keep doing the exercises.  Recheck with me as needed.   I am here for you.

## 2020-10-18 DIAGNOSIS — M7521 Bicipital tendinitis, right shoulder: Secondary | ICD-10-CM | POA: Diagnosis not present

## 2020-10-18 DIAGNOSIS — F17200 Nicotine dependence, unspecified, uncomplicated: Secondary | ICD-10-CM | POA: Diagnosis not present

## 2020-10-18 DIAGNOSIS — E785 Hyperlipidemia, unspecified: Secondary | ICD-10-CM | POA: Diagnosis not present

## 2020-10-18 DIAGNOSIS — M19011 Primary osteoarthritis, right shoulder: Secondary | ICD-10-CM | POA: Diagnosis not present

## 2020-10-18 DIAGNOSIS — G43709 Chronic migraine without aura, not intractable, without status migrainosus: Secondary | ICD-10-CM | POA: Diagnosis not present

## 2020-10-18 DIAGNOSIS — M25461 Effusion, right knee: Secondary | ICD-10-CM | POA: Diagnosis not present

## 2020-10-18 DIAGNOSIS — K219 Gastro-esophageal reflux disease without esophagitis: Secondary | ICD-10-CM | POA: Diagnosis not present

## 2020-10-18 DIAGNOSIS — K589 Irritable bowel syndrome without diarrhea: Secondary | ICD-10-CM | POA: Diagnosis not present

## 2020-10-18 DIAGNOSIS — E559 Vitamin D deficiency, unspecified: Secondary | ICD-10-CM | POA: Diagnosis not present

## 2020-10-18 DIAGNOSIS — R1310 Dysphagia, unspecified: Secondary | ICD-10-CM | POA: Diagnosis not present

## 2020-10-18 DIAGNOSIS — M67813 Other specified disorders of tendon, right shoulder: Secondary | ICD-10-CM | POA: Diagnosis not present

## 2020-10-18 DIAGNOSIS — G4700 Insomnia, unspecified: Secondary | ICD-10-CM | POA: Diagnosis not present

## 2020-10-18 DIAGNOSIS — E538 Deficiency of other specified B group vitamins: Secondary | ICD-10-CM | POA: Diagnosis not present

## 2020-10-18 DIAGNOSIS — K5909 Other constipation: Secondary | ICD-10-CM | POA: Diagnosis not present

## 2020-10-18 DIAGNOSIS — J449 Chronic obstructive pulmonary disease, unspecified: Secondary | ICD-10-CM | POA: Diagnosis not present

## 2020-10-18 DIAGNOSIS — G2119 Other drug induced secondary parkinsonism: Secondary | ICD-10-CM | POA: Diagnosis not present

## 2020-10-18 DIAGNOSIS — M797 Fibromyalgia: Secondary | ICD-10-CM | POA: Diagnosis not present

## 2020-10-18 DIAGNOSIS — I671 Cerebral aneurysm, nonruptured: Secondary | ICD-10-CM | POA: Diagnosis not present

## 2020-10-18 DIAGNOSIS — H6983 Other specified disorders of Eustachian tube, bilateral: Secondary | ICD-10-CM | POA: Diagnosis not present

## 2020-10-18 DIAGNOSIS — G629 Polyneuropathy, unspecified: Secondary | ICD-10-CM | POA: Diagnosis not present

## 2020-10-18 DIAGNOSIS — G8929 Other chronic pain: Secondary | ICD-10-CM | POA: Diagnosis not present

## 2020-10-23 DIAGNOSIS — Z79891 Long term (current) use of opiate analgesic: Secondary | ICD-10-CM | POA: Diagnosis not present

## 2020-10-23 DIAGNOSIS — M961 Postlaminectomy syndrome, not elsewhere classified: Secondary | ICD-10-CM | POA: Diagnosis not present

## 2020-10-23 DIAGNOSIS — M542 Cervicalgia: Secondary | ICD-10-CM | POA: Diagnosis not present

## 2020-10-23 DIAGNOSIS — M5136 Other intervertebral disc degeneration, lumbar region: Secondary | ICD-10-CM | POA: Diagnosis not present

## 2020-10-23 DIAGNOSIS — M4726 Other spondylosis with radiculopathy, lumbar region: Secondary | ICD-10-CM | POA: Diagnosis not present

## 2020-10-24 ENCOUNTER — Telehealth: Payer: Self-pay | Admitting: Internal Medicine

## 2020-10-24 NOTE — Telephone Encounter (Signed)
Patient called and was wondering if paper work from her dentist has been received for her to be cleared for dental work. She said that it would be coming from [facility]. Please advise    Dentist Office Phone: [phone]

## 2020-10-26 NOTE — Telephone Encounter (Signed)
I do recall a dental form I signed in the last 1-2 days, I just cant recall if it was exactly for her (but I think it probably was)

## 2020-10-27 ENCOUNTER — Telehealth: Payer: Self-pay | Admitting: Family Medicine

## 2020-10-27 DIAGNOSIS — Z7409 Other reduced mobility: Secondary | ICD-10-CM

## 2020-10-27 NOTE — Telephone Encounter (Signed)
[name] [phone]) from [facility] called. She has been attempting to contact [name] to recertify her PT but has not been able to do so. [name] frequently does not answer nor return her calls. They started PT for her shoulder and were wanting to continue work on her balance. She is unsure if [name] wants to continue or not, thought we might want to talk with [name] about this. If she does want to continue PT, a new order will need to be sent.

## 2020-10-31 NOTE — Telephone Encounter (Signed)
Pt would like to con't w/ home health PT to focus on her balance.  Please write new order for PT to [facility]

## 2020-10-31 NOTE — Telephone Encounter (Signed)
New referral for balance ordered today.

## 2020-11-02 NOTE — Telephone Encounter (Signed)
Dental form was faxed 10/31/20

## 2020-11-11 DIAGNOSIS — G43719 Chronic migraine without aura, intractable, without status migrainosus: Secondary | ICD-10-CM | POA: Diagnosis not present

## 2020-11-11 DIAGNOSIS — M75101 Unspecified rotator cuff tear or rupture of right shoulder, not specified as traumatic: Secondary | ICD-10-CM | POA: Diagnosis not present

## 2020-11-11 DIAGNOSIS — F17200 Nicotine dependence, unspecified, uncomplicated: Secondary | ICD-10-CM | POA: Diagnosis not present

## 2020-11-11 DIAGNOSIS — G8929 Other chronic pain: Secondary | ICD-10-CM | POA: Diagnosis not present

## 2020-11-11 DIAGNOSIS — G4700 Insomnia, unspecified: Secondary | ICD-10-CM | POA: Diagnosis not present

## 2020-11-11 DIAGNOSIS — G5620 Lesion of ulnar nerve, unspecified upper limb: Secondary | ICD-10-CM | POA: Diagnosis not present

## 2020-11-11 DIAGNOSIS — H6983 Other specified disorders of Eustachian tube, bilateral: Secondary | ICD-10-CM | POA: Diagnosis not present

## 2020-11-11 DIAGNOSIS — K5909 Other constipation: Secondary | ICD-10-CM | POA: Diagnosis not present

## 2020-11-11 DIAGNOSIS — E785 Hyperlipidemia, unspecified: Secondary | ICD-10-CM | POA: Diagnosis not present

## 2020-11-11 DIAGNOSIS — K219 Gastro-esophageal reflux disease without esophagitis: Secondary | ICD-10-CM | POA: Diagnosis not present

## 2020-11-11 DIAGNOSIS — G20 Parkinson's disease: Secondary | ICD-10-CM | POA: Diagnosis not present

## 2020-11-11 DIAGNOSIS — J309 Allergic rhinitis, unspecified: Secondary | ICD-10-CM | POA: Diagnosis not present

## 2020-11-11 DIAGNOSIS — M25461 Effusion, right knee: Secondary | ICD-10-CM | POA: Diagnosis not present

## 2020-11-11 DIAGNOSIS — R1310 Dysphagia, unspecified: Secondary | ICD-10-CM | POA: Diagnosis not present

## 2020-11-11 DIAGNOSIS — G629 Polyneuropathy, unspecified: Secondary | ICD-10-CM | POA: Diagnosis not present

## 2020-11-11 DIAGNOSIS — M545 Low back pain, unspecified: Secondary | ICD-10-CM | POA: Diagnosis not present

## 2020-11-11 DIAGNOSIS — K3184 Gastroparesis: Secondary | ICD-10-CM | POA: Diagnosis not present

## 2020-11-11 DIAGNOSIS — I671 Cerebral aneurysm, nonruptured: Secondary | ICD-10-CM | POA: Diagnosis not present

## 2020-11-11 DIAGNOSIS — I951 Orthostatic hypotension: Secondary | ICD-10-CM | POA: Diagnosis not present

## 2020-11-11 DIAGNOSIS — J449 Chronic obstructive pulmonary disease, unspecified: Secondary | ICD-10-CM | POA: Diagnosis not present

## 2020-11-15 ENCOUNTER — Other Ambulatory Visit: Payer: Self-pay | Admitting: Physician Assistant

## 2020-11-16 DIAGNOSIS — H6983 Other specified disorders of Eustachian tube, bilateral: Secondary | ICD-10-CM | POA: Diagnosis not present

## 2020-11-16 DIAGNOSIS — M25461 Effusion, right knee: Secondary | ICD-10-CM | POA: Diagnosis not present

## 2020-11-16 DIAGNOSIS — G4700 Insomnia, unspecified: Secondary | ICD-10-CM | POA: Diagnosis not present

## 2020-11-16 DIAGNOSIS — J449 Chronic obstructive pulmonary disease, unspecified: Secondary | ICD-10-CM | POA: Diagnosis not present

## 2020-11-16 DIAGNOSIS — F17200 Nicotine dependence, unspecified, uncomplicated: Secondary | ICD-10-CM | POA: Diagnosis not present

## 2020-11-16 DIAGNOSIS — K3184 Gastroparesis: Secondary | ICD-10-CM | POA: Diagnosis not present

## 2020-11-16 DIAGNOSIS — K219 Gastro-esophageal reflux disease without esophagitis: Secondary | ICD-10-CM | POA: Diagnosis not present

## 2020-11-16 DIAGNOSIS — G5620 Lesion of ulnar nerve, unspecified upper limb: Secondary | ICD-10-CM | POA: Diagnosis not present

## 2020-11-16 DIAGNOSIS — I671 Cerebral aneurysm, nonruptured: Secondary | ICD-10-CM | POA: Diagnosis not present

## 2020-11-16 DIAGNOSIS — G43719 Chronic migraine without aura, intractable, without status migrainosus: Secondary | ICD-10-CM | POA: Diagnosis not present

## 2020-11-16 DIAGNOSIS — M545 Low back pain, unspecified: Secondary | ICD-10-CM | POA: Diagnosis not present

## 2020-11-16 DIAGNOSIS — K5909 Other constipation: Secondary | ICD-10-CM | POA: Diagnosis not present

## 2020-11-16 DIAGNOSIS — J309 Allergic rhinitis, unspecified: Secondary | ICD-10-CM | POA: Diagnosis not present

## 2020-11-16 DIAGNOSIS — H527 Unspecified disorder of refraction: Secondary | ICD-10-CM | POA: Diagnosis not present

## 2020-11-16 DIAGNOSIS — H53461 Homonymous bilateral field defects, right side: Secondary | ICD-10-CM | POA: Diagnosis not present

## 2020-11-16 DIAGNOSIS — M75101 Unspecified rotator cuff tear or rupture of right shoulder, not specified as traumatic: Secondary | ICD-10-CM | POA: Diagnosis not present

## 2020-11-16 DIAGNOSIS — H40003 Preglaucoma, unspecified, bilateral: Secondary | ICD-10-CM | POA: Diagnosis not present

## 2020-11-16 DIAGNOSIS — E785 Hyperlipidemia, unspecified: Secondary | ICD-10-CM | POA: Diagnosis not present

## 2020-11-16 DIAGNOSIS — G8929 Other chronic pain: Secondary | ICD-10-CM | POA: Diagnosis not present

## 2020-11-16 DIAGNOSIS — R1310 Dysphagia, unspecified: Secondary | ICD-10-CM | POA: Diagnosis not present

## 2020-11-16 DIAGNOSIS — I951 Orthostatic hypotension: Secondary | ICD-10-CM | POA: Diagnosis not present

## 2020-11-16 DIAGNOSIS — H25813 Combined forms of age-related cataract, bilateral: Secondary | ICD-10-CM | POA: Diagnosis not present

## 2020-11-16 DIAGNOSIS — G629 Polyneuropathy, unspecified: Secondary | ICD-10-CM | POA: Diagnosis not present

## 2020-11-16 DIAGNOSIS — G20 Parkinson's disease: Secondary | ICD-10-CM | POA: Diagnosis not present

## 2020-11-21 DIAGNOSIS — F17200 Nicotine dependence, unspecified, uncomplicated: Secondary | ICD-10-CM | POA: Diagnosis not present

## 2020-11-21 DIAGNOSIS — G20 Parkinson's disease: Secondary | ICD-10-CM | POA: Diagnosis not present

## 2020-11-21 DIAGNOSIS — G4700 Insomnia, unspecified: Secondary | ICD-10-CM | POA: Diagnosis not present

## 2020-11-21 DIAGNOSIS — I671 Cerebral aneurysm, nonruptured: Secondary | ICD-10-CM | POA: Diagnosis not present

## 2020-11-21 DIAGNOSIS — K3184 Gastroparesis: Secondary | ICD-10-CM | POA: Diagnosis not present

## 2020-11-21 DIAGNOSIS — K219 Gastro-esophageal reflux disease without esophagitis: Secondary | ICD-10-CM | POA: Diagnosis not present

## 2020-11-21 DIAGNOSIS — G629 Polyneuropathy, unspecified: Secondary | ICD-10-CM | POA: Diagnosis not present

## 2020-11-21 DIAGNOSIS — G8929 Other chronic pain: Secondary | ICD-10-CM | POA: Diagnosis not present

## 2020-11-21 DIAGNOSIS — M545 Low back pain, unspecified: Secondary | ICD-10-CM | POA: Diagnosis not present

## 2020-11-21 DIAGNOSIS — R1310 Dysphagia, unspecified: Secondary | ICD-10-CM | POA: Diagnosis not present

## 2020-11-21 DIAGNOSIS — I951 Orthostatic hypotension: Secondary | ICD-10-CM | POA: Diagnosis not present

## 2020-11-21 DIAGNOSIS — M25461 Effusion, right knee: Secondary | ICD-10-CM | POA: Diagnosis not present

## 2020-11-21 DIAGNOSIS — J449 Chronic obstructive pulmonary disease, unspecified: Secondary | ICD-10-CM | POA: Diagnosis not present

## 2020-11-21 DIAGNOSIS — J309 Allergic rhinitis, unspecified: Secondary | ICD-10-CM | POA: Diagnosis not present

## 2020-11-21 DIAGNOSIS — G5620 Lesion of ulnar nerve, unspecified upper limb: Secondary | ICD-10-CM | POA: Diagnosis not present

## 2020-11-21 DIAGNOSIS — K5909 Other constipation: Secondary | ICD-10-CM | POA: Diagnosis not present

## 2020-11-21 DIAGNOSIS — M75101 Unspecified rotator cuff tear or rupture of right shoulder, not specified as traumatic: Secondary | ICD-10-CM | POA: Diagnosis not present

## 2020-11-21 DIAGNOSIS — E785 Hyperlipidemia, unspecified: Secondary | ICD-10-CM | POA: Diagnosis not present

## 2020-11-21 DIAGNOSIS — H6983 Other specified disorders of Eustachian tube, bilateral: Secondary | ICD-10-CM | POA: Diagnosis not present

## 2020-11-21 DIAGNOSIS — G43719 Chronic migraine without aura, intractable, without status migrainosus: Secondary | ICD-10-CM | POA: Diagnosis not present

## 2020-11-23 DIAGNOSIS — I671 Cerebral aneurysm, nonruptured: Secondary | ICD-10-CM | POA: Diagnosis not present

## 2020-11-23 DIAGNOSIS — M75101 Unspecified rotator cuff tear or rupture of right shoulder, not specified as traumatic: Secondary | ICD-10-CM | POA: Diagnosis not present

## 2020-11-23 DIAGNOSIS — E785 Hyperlipidemia, unspecified: Secondary | ICD-10-CM | POA: Diagnosis not present

## 2020-11-23 DIAGNOSIS — G20 Parkinson's disease: Secondary | ICD-10-CM | POA: Diagnosis not present

## 2020-11-23 DIAGNOSIS — K5909 Other constipation: Secondary | ICD-10-CM | POA: Diagnosis not present

## 2020-11-23 DIAGNOSIS — M25461 Effusion, right knee: Secondary | ICD-10-CM | POA: Diagnosis not present

## 2020-11-23 DIAGNOSIS — J449 Chronic obstructive pulmonary disease, unspecified: Secondary | ICD-10-CM | POA: Diagnosis not present

## 2020-11-23 DIAGNOSIS — J309 Allergic rhinitis, unspecified: Secondary | ICD-10-CM | POA: Diagnosis not present

## 2020-11-23 DIAGNOSIS — G629 Polyneuropathy, unspecified: Secondary | ICD-10-CM | POA: Diagnosis not present

## 2020-11-23 DIAGNOSIS — F17200 Nicotine dependence, unspecified, uncomplicated: Secondary | ICD-10-CM | POA: Diagnosis not present

## 2020-11-23 DIAGNOSIS — R1310 Dysphagia, unspecified: Secondary | ICD-10-CM | POA: Diagnosis not present

## 2020-11-23 DIAGNOSIS — H6983 Other specified disorders of Eustachian tube, bilateral: Secondary | ICD-10-CM | POA: Diagnosis not present

## 2020-11-23 DIAGNOSIS — G43719 Chronic migraine without aura, intractable, without status migrainosus: Secondary | ICD-10-CM | POA: Diagnosis not present

## 2020-11-23 DIAGNOSIS — G8929 Other chronic pain: Secondary | ICD-10-CM | POA: Diagnosis not present

## 2020-11-23 DIAGNOSIS — K3184 Gastroparesis: Secondary | ICD-10-CM | POA: Diagnosis not present

## 2020-11-23 DIAGNOSIS — G4700 Insomnia, unspecified: Secondary | ICD-10-CM | POA: Diagnosis not present

## 2020-11-23 DIAGNOSIS — G5620 Lesion of ulnar nerve, unspecified upper limb: Secondary | ICD-10-CM | POA: Diagnosis not present

## 2020-11-23 DIAGNOSIS — K219 Gastro-esophageal reflux disease without esophagitis: Secondary | ICD-10-CM | POA: Diagnosis not present

## 2020-11-23 DIAGNOSIS — I951 Orthostatic hypotension: Secondary | ICD-10-CM | POA: Diagnosis not present

## 2020-11-23 DIAGNOSIS — M545 Low back pain, unspecified: Secondary | ICD-10-CM | POA: Diagnosis not present

## 2020-11-28 ENCOUNTER — Ambulatory Visit: Payer: Medicare Other | Admitting: Physician Assistant

## 2020-11-28 DIAGNOSIS — J449 Chronic obstructive pulmonary disease, unspecified: Secondary | ICD-10-CM | POA: Diagnosis not present

## 2020-11-28 DIAGNOSIS — G20 Parkinson's disease: Secondary | ICD-10-CM | POA: Diagnosis not present

## 2020-11-28 DIAGNOSIS — E785 Hyperlipidemia, unspecified: Secondary | ICD-10-CM | POA: Diagnosis not present

## 2020-11-28 DIAGNOSIS — G43719 Chronic migraine without aura, intractable, without status migrainosus: Secondary | ICD-10-CM | POA: Diagnosis not present

## 2020-11-28 DIAGNOSIS — R1310 Dysphagia, unspecified: Secondary | ICD-10-CM | POA: Diagnosis not present

## 2020-11-28 DIAGNOSIS — J309 Allergic rhinitis, unspecified: Secondary | ICD-10-CM | POA: Diagnosis not present

## 2020-11-28 DIAGNOSIS — M545 Low back pain, unspecified: Secondary | ICD-10-CM | POA: Diagnosis not present

## 2020-11-28 DIAGNOSIS — K3184 Gastroparesis: Secondary | ICD-10-CM | POA: Diagnosis not present

## 2020-11-28 DIAGNOSIS — G5620 Lesion of ulnar nerve, unspecified upper limb: Secondary | ICD-10-CM | POA: Diagnosis not present

## 2020-11-28 DIAGNOSIS — G629 Polyneuropathy, unspecified: Secondary | ICD-10-CM | POA: Diagnosis not present

## 2020-11-28 DIAGNOSIS — K219 Gastro-esophageal reflux disease without esophagitis: Secondary | ICD-10-CM | POA: Diagnosis not present

## 2020-11-28 DIAGNOSIS — H6983 Other specified disorders of Eustachian tube, bilateral: Secondary | ICD-10-CM | POA: Diagnosis not present

## 2020-11-28 DIAGNOSIS — M25461 Effusion, right knee: Secondary | ICD-10-CM | POA: Diagnosis not present

## 2020-11-28 DIAGNOSIS — G8929 Other chronic pain: Secondary | ICD-10-CM | POA: Diagnosis not present

## 2020-11-28 DIAGNOSIS — K5909 Other constipation: Secondary | ICD-10-CM | POA: Diagnosis not present

## 2020-11-28 DIAGNOSIS — M75101 Unspecified rotator cuff tear or rupture of right shoulder, not specified as traumatic: Secondary | ICD-10-CM | POA: Diagnosis not present

## 2020-11-28 DIAGNOSIS — F17200 Nicotine dependence, unspecified, uncomplicated: Secondary | ICD-10-CM | POA: Diagnosis not present

## 2020-11-28 DIAGNOSIS — G4700 Insomnia, unspecified: Secondary | ICD-10-CM | POA: Diagnosis not present

## 2020-11-28 DIAGNOSIS — I671 Cerebral aneurysm, nonruptured: Secondary | ICD-10-CM | POA: Diagnosis not present

## 2020-11-28 DIAGNOSIS — I951 Orthostatic hypotension: Secondary | ICD-10-CM | POA: Diagnosis not present

## 2020-12-01 DIAGNOSIS — K3184 Gastroparesis: Secondary | ICD-10-CM | POA: Diagnosis not present

## 2020-12-01 DIAGNOSIS — G8929 Other chronic pain: Secondary | ICD-10-CM | POA: Diagnosis not present

## 2020-12-01 DIAGNOSIS — K219 Gastro-esophageal reflux disease without esophagitis: Secondary | ICD-10-CM | POA: Diagnosis not present

## 2020-12-01 DIAGNOSIS — M545 Low back pain, unspecified: Secondary | ICD-10-CM | POA: Diagnosis not present

## 2020-12-01 DIAGNOSIS — F17200 Nicotine dependence, unspecified, uncomplicated: Secondary | ICD-10-CM | POA: Diagnosis not present

## 2020-12-01 DIAGNOSIS — M75101 Unspecified rotator cuff tear or rupture of right shoulder, not specified as traumatic: Secondary | ICD-10-CM | POA: Diagnosis not present

## 2020-12-01 DIAGNOSIS — I671 Cerebral aneurysm, nonruptured: Secondary | ICD-10-CM | POA: Diagnosis not present

## 2020-12-01 DIAGNOSIS — G43719 Chronic migraine without aura, intractable, without status migrainosus: Secondary | ICD-10-CM | POA: Diagnosis not present

## 2020-12-01 DIAGNOSIS — G4700 Insomnia, unspecified: Secondary | ICD-10-CM | POA: Diagnosis not present

## 2020-12-01 DIAGNOSIS — J309 Allergic rhinitis, unspecified: Secondary | ICD-10-CM | POA: Diagnosis not present

## 2020-12-01 DIAGNOSIS — R1310 Dysphagia, unspecified: Secondary | ICD-10-CM | POA: Diagnosis not present

## 2020-12-01 DIAGNOSIS — H6983 Other specified disorders of Eustachian tube, bilateral: Secondary | ICD-10-CM | POA: Diagnosis not present

## 2020-12-01 DIAGNOSIS — G20 Parkinson's disease: Secondary | ICD-10-CM | POA: Diagnosis not present

## 2020-12-01 DIAGNOSIS — E785 Hyperlipidemia, unspecified: Secondary | ICD-10-CM | POA: Diagnosis not present

## 2020-12-01 DIAGNOSIS — G629 Polyneuropathy, unspecified: Secondary | ICD-10-CM | POA: Diagnosis not present

## 2020-12-01 DIAGNOSIS — M25461 Effusion, right knee: Secondary | ICD-10-CM | POA: Diagnosis not present

## 2020-12-01 DIAGNOSIS — J449 Chronic obstructive pulmonary disease, unspecified: Secondary | ICD-10-CM | POA: Diagnosis not present

## 2020-12-01 DIAGNOSIS — K5909 Other constipation: Secondary | ICD-10-CM | POA: Diagnosis not present

## 2020-12-01 DIAGNOSIS — G5620 Lesion of ulnar nerve, unspecified upper limb: Secondary | ICD-10-CM | POA: Diagnosis not present

## 2020-12-01 DIAGNOSIS — I951 Orthostatic hypotension: Secondary | ICD-10-CM | POA: Diagnosis not present

## 2020-12-05 DIAGNOSIS — I951 Orthostatic hypotension: Secondary | ICD-10-CM | POA: Diagnosis not present

## 2020-12-05 DIAGNOSIS — E785 Hyperlipidemia, unspecified: Secondary | ICD-10-CM | POA: Diagnosis not present

## 2020-12-05 DIAGNOSIS — M75101 Unspecified rotator cuff tear or rupture of right shoulder, not specified as traumatic: Secondary | ICD-10-CM | POA: Diagnosis not present

## 2020-12-05 DIAGNOSIS — R1310 Dysphagia, unspecified: Secondary | ICD-10-CM | POA: Diagnosis not present

## 2020-12-05 DIAGNOSIS — F17200 Nicotine dependence, unspecified, uncomplicated: Secondary | ICD-10-CM | POA: Diagnosis not present

## 2020-12-05 DIAGNOSIS — G43719 Chronic migraine without aura, intractable, without status migrainosus: Secondary | ICD-10-CM | POA: Diagnosis not present

## 2020-12-05 DIAGNOSIS — K219 Gastro-esophageal reflux disease without esophagitis: Secondary | ICD-10-CM | POA: Diagnosis not present

## 2020-12-05 DIAGNOSIS — I671 Cerebral aneurysm, nonruptured: Secondary | ICD-10-CM | POA: Diagnosis not present

## 2020-12-05 DIAGNOSIS — G5620 Lesion of ulnar nerve, unspecified upper limb: Secondary | ICD-10-CM | POA: Diagnosis not present

## 2020-12-05 DIAGNOSIS — G20 Parkinson's disease: Secondary | ICD-10-CM | POA: Diagnosis not present

## 2020-12-05 DIAGNOSIS — J449 Chronic obstructive pulmonary disease, unspecified: Secondary | ICD-10-CM | POA: Diagnosis not present

## 2020-12-05 DIAGNOSIS — J309 Allergic rhinitis, unspecified: Secondary | ICD-10-CM | POA: Diagnosis not present

## 2020-12-05 DIAGNOSIS — K3184 Gastroparesis: Secondary | ICD-10-CM | POA: Diagnosis not present

## 2020-12-05 DIAGNOSIS — G629 Polyneuropathy, unspecified: Secondary | ICD-10-CM | POA: Diagnosis not present

## 2020-12-05 DIAGNOSIS — G4700 Insomnia, unspecified: Secondary | ICD-10-CM | POA: Diagnosis not present

## 2020-12-05 DIAGNOSIS — K5909 Other constipation: Secondary | ICD-10-CM | POA: Diagnosis not present

## 2020-12-05 DIAGNOSIS — M545 Low back pain, unspecified: Secondary | ICD-10-CM | POA: Diagnosis not present

## 2020-12-05 DIAGNOSIS — G8929 Other chronic pain: Secondary | ICD-10-CM | POA: Diagnosis not present

## 2020-12-05 DIAGNOSIS — M25461 Effusion, right knee: Secondary | ICD-10-CM | POA: Diagnosis not present

## 2020-12-05 DIAGNOSIS — H6983 Other specified disorders of Eustachian tube, bilateral: Secondary | ICD-10-CM | POA: Diagnosis not present

## 2020-12-07 DIAGNOSIS — G43719 Chronic migraine without aura, intractable, without status migrainosus: Secondary | ICD-10-CM | POA: Diagnosis not present

## 2020-12-07 DIAGNOSIS — G20 Parkinson's disease: Secondary | ICD-10-CM | POA: Diagnosis not present

## 2020-12-07 DIAGNOSIS — J449 Chronic obstructive pulmonary disease, unspecified: Secondary | ICD-10-CM | POA: Diagnosis not present

## 2020-12-07 DIAGNOSIS — K219 Gastro-esophageal reflux disease without esophagitis: Secondary | ICD-10-CM | POA: Diagnosis not present

## 2020-12-07 DIAGNOSIS — M545 Low back pain, unspecified: Secondary | ICD-10-CM | POA: Diagnosis not present

## 2020-12-07 DIAGNOSIS — G4700 Insomnia, unspecified: Secondary | ICD-10-CM | POA: Diagnosis not present

## 2020-12-07 DIAGNOSIS — G629 Polyneuropathy, unspecified: Secondary | ICD-10-CM | POA: Diagnosis not present

## 2020-12-07 DIAGNOSIS — K3184 Gastroparesis: Secondary | ICD-10-CM | POA: Diagnosis not present

## 2020-12-07 DIAGNOSIS — K5909 Other constipation: Secondary | ICD-10-CM | POA: Diagnosis not present

## 2020-12-07 DIAGNOSIS — J309 Allergic rhinitis, unspecified: Secondary | ICD-10-CM | POA: Diagnosis not present

## 2020-12-07 DIAGNOSIS — E785 Hyperlipidemia, unspecified: Secondary | ICD-10-CM | POA: Diagnosis not present

## 2020-12-07 DIAGNOSIS — I671 Cerebral aneurysm, nonruptured: Secondary | ICD-10-CM | POA: Diagnosis not present

## 2020-12-07 DIAGNOSIS — G5620 Lesion of ulnar nerve, unspecified upper limb: Secondary | ICD-10-CM | POA: Diagnosis not present

## 2020-12-07 DIAGNOSIS — H6983 Other specified disorders of Eustachian tube, bilateral: Secondary | ICD-10-CM | POA: Diagnosis not present

## 2020-12-07 DIAGNOSIS — M25461 Effusion, right knee: Secondary | ICD-10-CM | POA: Diagnosis not present

## 2020-12-07 DIAGNOSIS — F17200 Nicotine dependence, unspecified, uncomplicated: Secondary | ICD-10-CM | POA: Diagnosis not present

## 2020-12-07 DIAGNOSIS — M75101 Unspecified rotator cuff tear or rupture of right shoulder, not specified as traumatic: Secondary | ICD-10-CM | POA: Diagnosis not present

## 2020-12-07 DIAGNOSIS — I951 Orthostatic hypotension: Secondary | ICD-10-CM | POA: Diagnosis not present

## 2020-12-07 DIAGNOSIS — G8929 Other chronic pain: Secondary | ICD-10-CM | POA: Diagnosis not present

## 2020-12-07 DIAGNOSIS — R1310 Dysphagia, unspecified: Secondary | ICD-10-CM | POA: Diagnosis not present

## 2020-12-13 DIAGNOSIS — L578 Other skin changes due to chronic exposure to nonionizing radiation: Secondary | ICD-10-CM | POA: Diagnosis not present

## 2020-12-13 DIAGNOSIS — L821 Other seborrheic keratosis: Secondary | ICD-10-CM | POA: Diagnosis not present

## 2020-12-13 DIAGNOSIS — L820 Inflamed seborrheic keratosis: Secondary | ICD-10-CM | POA: Diagnosis not present

## 2020-12-14 DIAGNOSIS — K5909 Other constipation: Secondary | ICD-10-CM | POA: Diagnosis not present

## 2020-12-14 DIAGNOSIS — G629 Polyneuropathy, unspecified: Secondary | ICD-10-CM | POA: Diagnosis not present

## 2020-12-14 DIAGNOSIS — J309 Allergic rhinitis, unspecified: Secondary | ICD-10-CM | POA: Diagnosis not present

## 2020-12-14 DIAGNOSIS — I671 Cerebral aneurysm, nonruptured: Secondary | ICD-10-CM | POA: Diagnosis not present

## 2020-12-14 DIAGNOSIS — G20 Parkinson's disease: Secondary | ICD-10-CM | POA: Diagnosis not present

## 2020-12-14 DIAGNOSIS — K3184 Gastroparesis: Secondary | ICD-10-CM | POA: Diagnosis not present

## 2020-12-14 DIAGNOSIS — H6983 Other specified disorders of Eustachian tube, bilateral: Secondary | ICD-10-CM | POA: Diagnosis not present

## 2020-12-14 DIAGNOSIS — M545 Low back pain, unspecified: Secondary | ICD-10-CM | POA: Diagnosis not present

## 2020-12-14 DIAGNOSIS — M25461 Effusion, right knee: Secondary | ICD-10-CM | POA: Diagnosis not present

## 2020-12-14 DIAGNOSIS — J449 Chronic obstructive pulmonary disease, unspecified: Secondary | ICD-10-CM | POA: Diagnosis not present

## 2020-12-14 DIAGNOSIS — R1310 Dysphagia, unspecified: Secondary | ICD-10-CM | POA: Diagnosis not present

## 2020-12-14 DIAGNOSIS — G43719 Chronic migraine without aura, intractable, without status migrainosus: Secondary | ICD-10-CM | POA: Diagnosis not present

## 2020-12-14 DIAGNOSIS — K219 Gastro-esophageal reflux disease without esophagitis: Secondary | ICD-10-CM | POA: Diagnosis not present

## 2020-12-14 DIAGNOSIS — M75101 Unspecified rotator cuff tear or rupture of right shoulder, not specified as traumatic: Secondary | ICD-10-CM | POA: Diagnosis not present

## 2020-12-14 DIAGNOSIS — G4700 Insomnia, unspecified: Secondary | ICD-10-CM | POA: Diagnosis not present

## 2020-12-14 DIAGNOSIS — F17200 Nicotine dependence, unspecified, uncomplicated: Secondary | ICD-10-CM | POA: Diagnosis not present

## 2020-12-14 DIAGNOSIS — G8929 Other chronic pain: Secondary | ICD-10-CM | POA: Diagnosis not present

## 2020-12-14 DIAGNOSIS — G5620 Lesion of ulnar nerve, unspecified upper limb: Secondary | ICD-10-CM | POA: Diagnosis not present

## 2020-12-14 DIAGNOSIS — I951 Orthostatic hypotension: Secondary | ICD-10-CM | POA: Diagnosis not present

## 2020-12-14 DIAGNOSIS — E785 Hyperlipidemia, unspecified: Secondary | ICD-10-CM | POA: Diagnosis not present

## 2020-12-20 DIAGNOSIS — M961 Postlaminectomy syndrome, not elsewhere classified: Secondary | ICD-10-CM | POA: Diagnosis not present

## 2020-12-20 DIAGNOSIS — Z79891 Long term (current) use of opiate analgesic: Secondary | ICD-10-CM | POA: Diagnosis not present

## 2020-12-20 DIAGNOSIS — M542 Cervicalgia: Secondary | ICD-10-CM | POA: Diagnosis not present

## 2020-12-20 DIAGNOSIS — M5136 Other intervertebral disc degeneration, lumbar region: Secondary | ICD-10-CM | POA: Diagnosis not present

## 2020-12-20 DIAGNOSIS — M4726 Other spondylosis with radiculopathy, lumbar region: Secondary | ICD-10-CM | POA: Diagnosis not present

## 2020-12-21 DIAGNOSIS — J309 Allergic rhinitis, unspecified: Secondary | ICD-10-CM | POA: Diagnosis not present

## 2020-12-21 DIAGNOSIS — J449 Chronic obstructive pulmonary disease, unspecified: Secondary | ICD-10-CM | POA: Diagnosis not present

## 2020-12-21 DIAGNOSIS — G8929 Other chronic pain: Secondary | ICD-10-CM | POA: Diagnosis not present

## 2020-12-21 DIAGNOSIS — I951 Orthostatic hypotension: Secondary | ICD-10-CM | POA: Diagnosis not present

## 2020-12-21 DIAGNOSIS — K5909 Other constipation: Secondary | ICD-10-CM | POA: Diagnosis not present

## 2020-12-21 DIAGNOSIS — E785 Hyperlipidemia, unspecified: Secondary | ICD-10-CM | POA: Diagnosis not present

## 2020-12-21 DIAGNOSIS — G20 Parkinson's disease: Secondary | ICD-10-CM | POA: Diagnosis not present

## 2020-12-21 DIAGNOSIS — G43719 Chronic migraine without aura, intractable, without status migrainosus: Secondary | ICD-10-CM | POA: Diagnosis not present

## 2020-12-21 DIAGNOSIS — G629 Polyneuropathy, unspecified: Secondary | ICD-10-CM | POA: Diagnosis not present

## 2020-12-21 DIAGNOSIS — F17200 Nicotine dependence, unspecified, uncomplicated: Secondary | ICD-10-CM | POA: Diagnosis not present

## 2020-12-21 DIAGNOSIS — I671 Cerebral aneurysm, nonruptured: Secondary | ICD-10-CM | POA: Diagnosis not present

## 2020-12-21 DIAGNOSIS — K3184 Gastroparesis: Secondary | ICD-10-CM | POA: Diagnosis not present

## 2020-12-21 DIAGNOSIS — G5620 Lesion of ulnar nerve, unspecified upper limb: Secondary | ICD-10-CM | POA: Diagnosis not present

## 2020-12-21 DIAGNOSIS — M545 Low back pain, unspecified: Secondary | ICD-10-CM | POA: Diagnosis not present

## 2020-12-21 DIAGNOSIS — K219 Gastro-esophageal reflux disease without esophagitis: Secondary | ICD-10-CM | POA: Diagnosis not present

## 2020-12-21 DIAGNOSIS — M75101 Unspecified rotator cuff tear or rupture of right shoulder, not specified as traumatic: Secondary | ICD-10-CM | POA: Diagnosis not present

## 2020-12-21 DIAGNOSIS — R1310 Dysphagia, unspecified: Secondary | ICD-10-CM | POA: Diagnosis not present

## 2020-12-21 DIAGNOSIS — G4700 Insomnia, unspecified: Secondary | ICD-10-CM | POA: Diagnosis not present

## 2020-12-21 DIAGNOSIS — H6983 Other specified disorders of Eustachian tube, bilateral: Secondary | ICD-10-CM | POA: Diagnosis not present

## 2020-12-21 DIAGNOSIS — M25461 Effusion, right knee: Secondary | ICD-10-CM | POA: Diagnosis not present

## 2020-12-25 DIAGNOSIS — K219 Gastro-esophageal reflux disease without esophagitis: Secondary | ICD-10-CM | POA: Diagnosis not present

## 2020-12-25 DIAGNOSIS — K3184 Gastroparesis: Secondary | ICD-10-CM | POA: Diagnosis not present

## 2020-12-25 DIAGNOSIS — J309 Allergic rhinitis, unspecified: Secondary | ICD-10-CM | POA: Diagnosis not present

## 2020-12-25 DIAGNOSIS — G8929 Other chronic pain: Secondary | ICD-10-CM | POA: Diagnosis not present

## 2020-12-25 DIAGNOSIS — K5909 Other constipation: Secondary | ICD-10-CM | POA: Diagnosis not present

## 2020-12-25 DIAGNOSIS — G629 Polyneuropathy, unspecified: Secondary | ICD-10-CM | POA: Diagnosis not present

## 2020-12-25 DIAGNOSIS — G5620 Lesion of ulnar nerve, unspecified upper limb: Secondary | ICD-10-CM | POA: Diagnosis not present

## 2020-12-25 DIAGNOSIS — I671 Cerebral aneurysm, nonruptured: Secondary | ICD-10-CM | POA: Diagnosis not present

## 2020-12-25 DIAGNOSIS — M545 Low back pain, unspecified: Secondary | ICD-10-CM | POA: Diagnosis not present

## 2020-12-25 DIAGNOSIS — M25461 Effusion, right knee: Secondary | ICD-10-CM | POA: Diagnosis not present

## 2020-12-25 DIAGNOSIS — J449 Chronic obstructive pulmonary disease, unspecified: Secondary | ICD-10-CM | POA: Diagnosis not present

## 2020-12-25 DIAGNOSIS — E785 Hyperlipidemia, unspecified: Secondary | ICD-10-CM | POA: Diagnosis not present

## 2020-12-25 DIAGNOSIS — G43719 Chronic migraine without aura, intractable, without status migrainosus: Secondary | ICD-10-CM | POA: Diagnosis not present

## 2020-12-25 DIAGNOSIS — I951 Orthostatic hypotension: Secondary | ICD-10-CM | POA: Diagnosis not present

## 2020-12-25 DIAGNOSIS — G20 Parkinson's disease: Secondary | ICD-10-CM | POA: Diagnosis not present

## 2020-12-25 DIAGNOSIS — M75101 Unspecified rotator cuff tear or rupture of right shoulder, not specified as traumatic: Secondary | ICD-10-CM | POA: Diagnosis not present

## 2020-12-25 DIAGNOSIS — H6983 Other specified disorders of Eustachian tube, bilateral: Secondary | ICD-10-CM | POA: Diagnosis not present

## 2020-12-25 DIAGNOSIS — G4700 Insomnia, unspecified: Secondary | ICD-10-CM | POA: Diagnosis not present

## 2020-12-25 DIAGNOSIS — F17200 Nicotine dependence, unspecified, uncomplicated: Secondary | ICD-10-CM | POA: Diagnosis not present

## 2020-12-25 DIAGNOSIS — R1310 Dysphagia, unspecified: Secondary | ICD-10-CM | POA: Diagnosis not present

## 2020-12-31 ENCOUNTER — Other Ambulatory Visit: Payer: Self-pay | Admitting: Internal Medicine

## 2020-12-31 NOTE — Telephone Encounter (Signed)
Please refill as per office routine med refill policy (all routine meds refilled for 3 mo or monthly per pt preference up to one year from last visit, then month to month grace period for 3 mo, then further med refills will have to be denied)  

## 2021-01-01 DIAGNOSIS — I951 Orthostatic hypotension: Secondary | ICD-10-CM | POA: Diagnosis not present

## 2021-01-01 DIAGNOSIS — F17200 Nicotine dependence, unspecified, uncomplicated: Secondary | ICD-10-CM | POA: Diagnosis not present

## 2021-01-01 DIAGNOSIS — J449 Chronic obstructive pulmonary disease, unspecified: Secondary | ICD-10-CM | POA: Diagnosis not present

## 2021-01-01 DIAGNOSIS — K5909 Other constipation: Secondary | ICD-10-CM | POA: Diagnosis not present

## 2021-01-01 DIAGNOSIS — G629 Polyneuropathy, unspecified: Secondary | ICD-10-CM | POA: Diagnosis not present

## 2021-01-01 DIAGNOSIS — G20 Parkinson's disease: Secondary | ICD-10-CM | POA: Diagnosis not present

## 2021-01-01 DIAGNOSIS — K219 Gastro-esophageal reflux disease without esophagitis: Secondary | ICD-10-CM | POA: Diagnosis not present

## 2021-01-01 DIAGNOSIS — G8929 Other chronic pain: Secondary | ICD-10-CM | POA: Diagnosis not present

## 2021-01-01 DIAGNOSIS — H6983 Other specified disorders of Eustachian tube, bilateral: Secondary | ICD-10-CM | POA: Diagnosis not present

## 2021-01-01 DIAGNOSIS — G43719 Chronic migraine without aura, intractable, without status migrainosus: Secondary | ICD-10-CM | POA: Diagnosis not present

## 2021-01-01 DIAGNOSIS — G4700 Insomnia, unspecified: Secondary | ICD-10-CM | POA: Diagnosis not present

## 2021-01-01 DIAGNOSIS — M545 Low back pain, unspecified: Secondary | ICD-10-CM | POA: Diagnosis not present

## 2021-01-01 DIAGNOSIS — M75101 Unspecified rotator cuff tear or rupture of right shoulder, not specified as traumatic: Secondary | ICD-10-CM | POA: Diagnosis not present

## 2021-01-01 DIAGNOSIS — I671 Cerebral aneurysm, nonruptured: Secondary | ICD-10-CM | POA: Diagnosis not present

## 2021-01-01 DIAGNOSIS — E785 Hyperlipidemia, unspecified: Secondary | ICD-10-CM | POA: Diagnosis not present

## 2021-01-01 DIAGNOSIS — R1310 Dysphagia, unspecified: Secondary | ICD-10-CM | POA: Diagnosis not present

## 2021-01-01 DIAGNOSIS — K3184 Gastroparesis: Secondary | ICD-10-CM | POA: Diagnosis not present

## 2021-01-01 DIAGNOSIS — J309 Allergic rhinitis, unspecified: Secondary | ICD-10-CM | POA: Diagnosis not present

## 2021-01-01 DIAGNOSIS — M25461 Effusion, right knee: Secondary | ICD-10-CM | POA: Diagnosis not present

## 2021-01-01 DIAGNOSIS — G5620 Lesion of ulnar nerve, unspecified upper limb: Secondary | ICD-10-CM | POA: Diagnosis not present

## 2021-01-03 ENCOUNTER — Other Ambulatory Visit (HOSPITAL_COMMUNITY): Payer: Self-pay | Admitting: Interventional Radiology

## 2021-01-03 ENCOUNTER — Ambulatory Visit: Payer: Medicare Other | Admitting: Physician Assistant

## 2021-01-03 DIAGNOSIS — I729 Aneurysm of unspecified site: Secondary | ICD-10-CM

## 2021-01-09 DIAGNOSIS — G4700 Insomnia, unspecified: Secondary | ICD-10-CM | POA: Diagnosis not present

## 2021-01-09 DIAGNOSIS — G8929 Other chronic pain: Secondary | ICD-10-CM | POA: Diagnosis not present

## 2021-01-09 DIAGNOSIS — K3184 Gastroparesis: Secondary | ICD-10-CM | POA: Diagnosis not present

## 2021-01-09 DIAGNOSIS — M545 Low back pain, unspecified: Secondary | ICD-10-CM | POA: Diagnosis not present

## 2021-01-09 DIAGNOSIS — F17200 Nicotine dependence, unspecified, uncomplicated: Secondary | ICD-10-CM | POA: Diagnosis not present

## 2021-01-09 DIAGNOSIS — R1310 Dysphagia, unspecified: Secondary | ICD-10-CM | POA: Diagnosis not present

## 2021-01-09 DIAGNOSIS — M25461 Effusion, right knee: Secondary | ICD-10-CM | POA: Diagnosis not present

## 2021-01-09 DIAGNOSIS — M75101 Unspecified rotator cuff tear or rupture of right shoulder, not specified as traumatic: Secondary | ICD-10-CM | POA: Diagnosis not present

## 2021-01-09 DIAGNOSIS — G20 Parkinson's disease: Secondary | ICD-10-CM | POA: Diagnosis not present

## 2021-01-09 DIAGNOSIS — J309 Allergic rhinitis, unspecified: Secondary | ICD-10-CM | POA: Diagnosis not present

## 2021-01-09 DIAGNOSIS — I951 Orthostatic hypotension: Secondary | ICD-10-CM | POA: Diagnosis not present

## 2021-01-09 DIAGNOSIS — G5620 Lesion of ulnar nerve, unspecified upper limb: Secondary | ICD-10-CM | POA: Diagnosis not present

## 2021-01-09 DIAGNOSIS — K5909 Other constipation: Secondary | ICD-10-CM | POA: Diagnosis not present

## 2021-01-09 DIAGNOSIS — K219 Gastro-esophageal reflux disease without esophagitis: Secondary | ICD-10-CM | POA: Diagnosis not present

## 2021-01-09 DIAGNOSIS — H6983 Other specified disorders of Eustachian tube, bilateral: Secondary | ICD-10-CM | POA: Diagnosis not present

## 2021-01-09 DIAGNOSIS — E785 Hyperlipidemia, unspecified: Secondary | ICD-10-CM | POA: Diagnosis not present

## 2021-01-09 DIAGNOSIS — G629 Polyneuropathy, unspecified: Secondary | ICD-10-CM | POA: Diagnosis not present

## 2021-01-09 DIAGNOSIS — I671 Cerebral aneurysm, nonruptured: Secondary | ICD-10-CM | POA: Diagnosis not present

## 2021-01-09 DIAGNOSIS — J449 Chronic obstructive pulmonary disease, unspecified: Secondary | ICD-10-CM | POA: Diagnosis not present

## 2021-01-09 DIAGNOSIS — G43719 Chronic migraine without aura, intractable, without status migrainosus: Secondary | ICD-10-CM | POA: Diagnosis not present

## 2021-01-17 ENCOUNTER — Other Ambulatory Visit: Payer: Self-pay | Admitting: Internal Medicine

## 2021-01-18 ENCOUNTER — Encounter (HOSPITAL_COMMUNITY): Payer: Self-pay

## 2021-01-18 ENCOUNTER — Ambulatory Visit (HOSPITAL_COMMUNITY)
Admission: RE | Admit: 2021-01-18 | Discharge: 2021-01-18 | Disposition: A | Discharge status: Home / Self Care | Payer: Medicare Other | Source: Ambulatory Visit | Attending: Interventional Radiology | Admitting: Interventional Radiology

## 2021-01-18 ENCOUNTER — Other Ambulatory Visit: Payer: Self-pay

## 2021-01-18 DIAGNOSIS — I729 Aneurysm of unspecified site: Secondary | ICD-10-CM | POA: Diagnosis not present

## 2021-01-18 DIAGNOSIS — I728 Aneurysm of other specified arteries: Secondary | ICD-10-CM | POA: Diagnosis not present

## 2021-01-18 LAB — POCT I-STAT CREATININE: Creatinine, Ser: 0.8 mg/dL (ref 0.44–1.00)

## 2021-01-18 MED ORDER — IOHEXOL 350 MG/ML SOLN
100.0000 mL | Freq: Once | INTRAVENOUS | Status: AC | PRN
  Administered 2021-01-18: 75 mL via INTRAVENOUS

## 2021-01-19 ENCOUNTER — Other Ambulatory Visit: Payer: Self-pay | Admitting: Physician Assistant

## 2021-01-23 ENCOUNTER — Telehealth (HOSPITAL_COMMUNITY): Payer: Self-pay

## 2021-01-23 NOTE — Telephone Encounter (Signed)
Ch[place] from [facility], NP: Hello, Dr. D would like a 1 year CTA on this patient. Thank you.

## 2021-01-23 NOTE — Telephone Encounter (Signed)
Called pt regarding recent imaging, no answer, left vm. AW  

## 2021-01-24 ENCOUNTER — Telehealth (HOSPITAL_COMMUNITY): Payer: Self-pay

## 2021-01-24 NOTE — Telephone Encounter (Signed)
Pt agreed to f/u in 1 year with cta head/neck. AW  

## 2021-02-01 ENCOUNTER — Ambulatory Visit: Payer: Medicare Other | Admitting: Physician Assistant

## 2021-02-01 ENCOUNTER — Telehealth: Payer: Self-pay | Admitting: Internal Medicine

## 2021-02-01 ENCOUNTER — Other Ambulatory Visit: Payer: Self-pay

## 2021-02-01 ENCOUNTER — Encounter: Payer: Self-pay | Admitting: Physician Assistant

## 2021-02-01 DIAGNOSIS — F3010 Manic episode without psychotic symptoms, unspecified: Secondary | ICD-10-CM | POA: Diagnosis not present

## 2021-02-01 DIAGNOSIS — F5105 Insomnia due to other mental disorder: Secondary | ICD-10-CM

## 2021-02-01 DIAGNOSIS — F99 Mental disorder, not otherwise specified: Secondary | ICD-10-CM

## 2021-02-01 DIAGNOSIS — F411 Generalized anxiety disorder: Secondary | ICD-10-CM

## 2021-02-01 DIAGNOSIS — F259 Schizoaffective disorder, unspecified: Secondary | ICD-10-CM | POA: Diagnosis not present

## 2021-02-01 DIAGNOSIS — F17200 Nicotine dependence, unspecified, uncomplicated: Secondary | ICD-10-CM

## 2021-02-01 MED ORDER — TRAZODONE HCL 50 MG PO TABS: 25.0000 mg | ORAL_TABLET | Freq: Every day | ORAL | 1 refills | Status: DC

## 2021-02-01 MED ORDER — BUPROPION HCL ER (XL) 300 MG PO TB24: 300.0000 mg | ORAL_TABLET | Freq: Every day | ORAL | 1 refills | Status: DC

## 2021-02-01 NOTE — Telephone Encounter (Signed)
LVM for pt to rtn my call to schedule awv with nha.. Please schedule AWV if pt calls the office.  

## 2021-02-01 NOTE — Progress Notes (Signed)
[facility]  Patient ID: [name],  MRN: [id number]  PCP: Biagio Borg, MD  Date of Evaluation: 02/01/2021. Time spent:45 minutes  Chief Complaint:  Chief Complaint   Anxiety; Insomnia; Depression     HISTORY/CURRENT STATUS: HPI For routine med check.  Her friend [name] is with her today.  Only sleeps about 3 to 4 hours every night. [name] says she's been manic for a few weeks. More talkative.  It is not unusual for her to have insomnia but usually she has more than 4 hours of sleep every night.  She's been cleaning more, things that she wouldn't ever scrub. Has racing thoughts.  No increased spending.  No grandiosity.  No increased anger or irritability.  No paranoia or hallucinations.  Patient denies loss of interest in usual activities and is able to enjoy things.  Denies decreased energy or motivation.  Appetite has not changed.  No extreme sadness, tearfulness, or feelings of hopelessness.  Denies any changes in concentration, making decisions or remembering things.  Denies suicidal or homicidal thoughts.  Wasn't able to quit smoking like she had wanted.  States she will work on that later.  Denies dizziness, syncope, seizures, numbness, tingling, tics, unsteady gait, slurred speech, confusion. Denies muscle or joint pain, stiffness, or dystonia.  The tremor that she had is barely a problem now.  Individual Medical History/ Review of Systems: Changes? :No    Past medications for mental health diagnoses include: Ingrezza was ineffective, Lamictal, Risperdal, Zyprexa, Cogentin, Latuda, Cymbalta, lithium, Ambien, prazosin, Ativan, Wellbutrin, Zoloft, and probably others  Allergies: Ambien [zolpidem tartrate], Hydrocodone, Lactose intolerance (gi), Nsaids, Propoxyphene n-acetaminophen, Tramadol, and Chantix [varenicline]  Current Medications:  Current Outpatient Medications:    aspirin 325 MG tablet, Take 325 mg by mouth daily., Disp: , Rfl:    atorvastatin  (LIPITOR) 40 MG tablet, TAKE 1 TABLET BY MOUTH  DAILY, Disp: 90 tablet, Rfl: 0   buPROPion (WELLBUTRIN XL) 150 MG 24 hr tablet, Take 1 tablet (150 mg total) by mouth daily. Take 1 po with 300 mg= 450 mg/day., Disp: 90 tablet, Rfl: 1   Cholecalciferol (VITAMIN D) 50 MCG (2000 UT) tablet, Take 2,000 Units by mouth daily., Disp: , Rfl:    gabapentin (NEURONTIN) 800 MG tablet, TAKE 1 TABLET BY MOUTH 3  TIMES DAILY, Disp: 270 tablet, Rfl: 0   lamoTRIgine (LAMICTAL) 100 MG tablet, TAKE 1 TABLET BY MOUTH IN  THE MORNING AND 3 TABLETS  BY MOUTH AT BEDTIME, Disp: 360 tablet, Rfl: 3   naloxone (NARCAN) 4 MG/0.1ML LIQD nasal spray kit, Place 0.4 mg into the nose as needed (opioid overdose). , Disp: , Rfl:    Oxycodone HCl 20 MG TABS, Take 1 tablet by mouth every 6 (six) hours as needed (pain). , Disp: , Rfl:    pantoprazole (PROTONIX) 40 MG tablet, Take 40 mg by mouth 2 (two) times daily., Disp: , Rfl:    potassium chloride (KLOR-CON) 10 MEQ tablet, TAKE 1 TABLET BY MOUTH  DAILY, Disp: 90 tablet, Rfl: 3   promethazine (PHENERGAN) 12.5 MG tablet, Take 1 tablet by mouth  every 6 hours as needed for nausea (Patient taking differently: Take 12.5 mg by mouth every 6 (six) hours as needed for nausea.), Disp: 90 tablet, Rfl: 0   risperidone (RISPERDAL) 4 MG tablet, TAKE 1 TABLET BY MOUTH IN  THE MORNING THEN 1 AND 1/2  TABLETS BY MOUTH AT NIGHT, Disp: 225 tablet, Rfl: 3   traZODone (DESYREL) 50 MG tablet,  Take 0.5-2 tablets (25-100 mg total) by mouth at bedtime., Disp: 60 tablet, Rfl: 1   vitamin B-12 (CYANOCOBALAMIN) 1000 MCG tablet, Take 1 tablet (1,000 mcg total) by mouth daily., Disp: 90 tablet, Rfl: 3   buPROPion (WELLBUTRIN XL) 300 MG 24 hr tablet, Take 1 tablet (300 mg total) by mouth daily., Disp: 90 tablet, Rfl: 1   fexofenadine (ALLEGRA) 180 MG tablet, Take 180 mg by mouth daily. (Patient not taking: Reported on 02/01/2021), Disp: , Rfl:  Medication Side Effects: none   Family Medical/ Social History:  Changes? No  MENTAL HEALTH EXAM:  There were no vitals taken for this visit.There is no height or weight on file to calculate BMI.  General Appearance: Casual, Neat and Well Groomed  Eye Contact:  Fair  Speech:  Clear and Coherent, Normal Rate, and Talkative  Volume:  Normal  Mood:  Euphoric  Affect:  Congruent  Thought Process:  Goal Directed and Descriptions of Associations: Intact  Orientation:  Full (Time, Place, and Person)  Thought Content: Logical   Suicidal Thoughts:  No  Homicidal Thoughts:  No  Memory:  Recent;   Fair  Judgement:  Good  Insight:  Good  Psychomotor Activity:   Gait is normal.  Minimal tremor in her left arm at rest.  Concentration:  Concentration: Good  Recall:  Good  Fund of Knowledge: Good  Language: Good  Assets:  Desire for Improvement  ADL's:  Intact  Cognition: WNL  Prognosis:  Good    DIAGNOSES:    ICD-10-CM   1. Manic behavior ([place])  F30.10     2. Schizoaffective disorder, unspecified type ([place])  F25.9     3. Generalized anxiety disorder  F41.1     4. Insomnia due to other mental disorder  F51.05    F99     5. Smoker  F17.200        Receiving Psychotherapy: No    RECOMMENDATIONS:  PDMP reviewed.  No red flags. I provided 45 minutes of face to face time during this encounter, including time spent before and after the visit in records review, medical decision making, counseling pertinent to today's visit, and charting.  She is already on a pretty high dose of Risperdal.  This drug has helped more than any others so we both prefer not to have to change it.  However if the manic symptoms do not improve within a few days or are causing problems such as overspending or risky or impulsive behaviors, she or her friend, he will let me know and we will change quickly. Vonda feels if she could get some sleep these other behaviors would improve.   Discussed different treatments for insomnia.  I recommend trazodone or mirtazapine.  She has  never taken either of those.  I discussed benefits, risks, side effects and she accepts. Smoking cessation was discussed.  She is not ready to quit. Start trazodone 50 mg, 1/2 to 2 pills nightly as needed sleep. Continue Wellbutrin XL 450 mg p.o. daily.   Continue Risperdal 4 mg, 1 p.o. every morning, 1.5 p.o. nightly. Continue gabapentin 800 mg, 1 p.o. 3 times daily. Continue Lamictal 100 mg, 1 p.o. every morning and 3 p.o. nightly. Return in 6 to 8 weeks.  Donnal Moat, PA-C

## 2021-02-02 ENCOUNTER — Other Ambulatory Visit: Payer: Self-pay | Admitting: Physician Assistant

## 2021-02-05 ENCOUNTER — Ambulatory Visit: Payer: Medicare Other | Admitting: Internal Medicine

## 2021-02-09 ENCOUNTER — Other Ambulatory Visit: Payer: Self-pay

## 2021-02-09 ENCOUNTER — Ambulatory Visit (INDEPENDENT_AMBULATORY_CARE_PROVIDER_SITE_OTHER): Payer: Medicare Other | Admitting: Internal Medicine

## 2021-02-09 ENCOUNTER — Ambulatory Visit (INDEPENDENT_AMBULATORY_CARE_PROVIDER_SITE_OTHER): Payer: Medicare Other

## 2021-02-09 VITALS — BP 118/80 | HR 84 | Temp 98.3°F | Resp 16 | Ht 65.0 in | Wt 123.8 lb

## 2021-02-09 DIAGNOSIS — E538 Deficiency of other specified B group vitamins: Secondary | ICD-10-CM | POA: Diagnosis not present

## 2021-02-09 DIAGNOSIS — F17200 Nicotine dependence, unspecified, uncomplicated: Secondary | ICD-10-CM

## 2021-02-09 DIAGNOSIS — Z0000 Encounter for general adult medical examination without abnormal findings: Secondary | ICD-10-CM

## 2021-02-09 DIAGNOSIS — E785 Hyperlipidemia, unspecified: Secondary | ICD-10-CM

## 2021-02-09 DIAGNOSIS — J439 Emphysema, unspecified: Secondary | ICD-10-CM | POA: Diagnosis not present

## 2021-02-09 DIAGNOSIS — R634 Abnormal weight loss: Secondary | ICD-10-CM

## 2021-02-09 DIAGNOSIS — E559 Vitamin D deficiency, unspecified: Secondary | ICD-10-CM | POA: Diagnosis not present

## 2021-02-09 LAB — CBC WITH DIFFERENTIAL/PLATELET
Basophils Absolute: 0.1 10*3/uL (ref 0.0–0.1)
Basophils Relative: 0.8 % (ref 0.0–3.0)
Eosinophils Absolute: 0.1 10*3/uL (ref 0.0–0.7)
Eosinophils Relative: 1.3 % (ref 0.0–5.0)
HCT: 41.9 % (ref 36.0–46.0)
Hemoglobin: 13.9 g/dL (ref 12.0–15.0)
Lymphocytes Relative: 28.5 % (ref 12.0–46.0)
Lymphs Abs: 1.9 10*3/uL (ref 0.7–4.0)
MCHC: 33.2 g/dL (ref 30.0–36.0)
MCV: 99.7 fl (ref 78.0–100.0)
Monocytes Absolute: 0.8 10*3/uL (ref 0.1–1.0)
Monocytes Relative: 11.3 % (ref 3.0–12.0)
Neutro Abs: 3.9 10*3/uL (ref 1.4–7.7)
Neutrophils Relative %: 58.1 % (ref 43.0–77.0)
Platelets: 288 10*3/uL (ref 150.0–400.0)
RBC: 4.2 Mil/uL (ref 3.87–5.11)
RDW: 14.6 % (ref 11.5–15.5)
WBC: 6.7 10*3/uL (ref 4.0–10.5)

## 2021-02-09 LAB — URINALYSIS, ROUTINE W REFLEX MICROSCOPIC
Bilirubin Urine: NEGATIVE
Ketones, ur: NEGATIVE
Leukocytes,Ua: NEGATIVE
Nitrite: NEGATIVE
Specific Gravity, Urine: <=1.005 — AB (ref 1.000–1.030)
Total Protein, Urine: NEGATIVE
Urine Glucose: NEGATIVE
Urobilinogen, UA: 0.2 (ref 0.0–1.0)
pH: 6 (ref 5.0–8.0)

## 2021-02-09 LAB — HEPATIC FUNCTION PANEL
ALT: 8 U/L (ref 0–35)
AST: 10 U/L (ref 0–37)
Albumin: 3.8 g/dL (ref 3.5–5.2)
Alkaline Phosphatase: 57 U/L (ref 39–117)
Bilirubin, Direct: 0.1 mg/dL (ref 0.0–0.3)
Total Bilirubin: 0.4 mg/dL (ref 0.2–1.2)
Total Protein: 6.3 g/dL (ref 6.0–8.3)

## 2021-02-09 LAB — BASIC METABOLIC PANEL
BUN: 9 mg/dL (ref 6–23)
CO2: 29 mEq/L (ref 19–32)
Calcium: 9.1 mg/dL (ref 8.4–10.5)
Chloride: 96 mEq/L (ref 96–112)
Creatinine, Ser: 0.96 mg/dL (ref 0.40–1.20)
GFR: 63.24 mL/min (ref >60.00)
Glucose, Bld: 82 mg/dL (ref 70–99)
Potassium: 3.9 mEq/L (ref 3.5–5.1)
Sodium: 132 mEq/L — ABNORMAL LOW (ref 135–145)

## 2021-02-09 LAB — TSH: TSH: 1.21 u[IU]/mL (ref 0.35–5.50)

## 2021-02-09 NOTE — Progress Notes (Signed)
Patient ID: [name], female   DOB: 1955/11/12, 65 y.o.   MRN: [id number]        Chief Complaint: follow up low b12 and vit d, malnutrition, smoking nd wt loss       HPI:  [name] is a 65 y.o. female here overall doing ok but Has lost over 15 lbs with dental issues and may have several teeth need to be pulled, has oral surgury appt for next wk.  Runs out of food and cigarrettes the last 2 wks of every month but a has friend to supply those.  Not ready to quit smoking.  Not taking Vit d,  Not taking b12.  Pt denies chest pain, increased sob or doe, wheezing, orthopnea, PND, increased LE swelling, palpitations, dizziness or syncope.   Pt denies polydipsia, polyuria, or new focal neuro s/s.   Pt denies fever, wt loss, night sweats, loss of appetite, or other constitutional symptoms     Wt Readings from Last 3 Encounters:  02/09/21 123 lb 12.8 oz (56.2 kg)  10/17/20 126 lb (57.2 kg)  08/17/20 141 lb 4.8 oz (64.1 kg)   BP Readings from Last 3 Encounters:  02/09/21 118/80  10/17/20 100/60  08/17/20 96/62         Past Medical History:  Diagnosis Date   Allergic rhinitis    Arthritis    Brain aneurysm    Chronic abdominal pain    COPD (chronic obstructive pulmonary disease) (HCC)    denies   Depression    bipolar   Fibromyalgia    Gastroparesis    GERD (gastroesophageal reflux disease)    H/O hiatal hernia    History of colonic polyps    HLD (hyperlipidemia)    IBS (irritable bowel syndrome)    chronic constipation   Migraines    OCD (obsessive compulsive disorder)    anxiety   Peripheral neuropathy    Past Surgical History:  Procedure Laterality Date   ABDOMINAL HYSTERECTOMY     APPENDECTOMY     CERVICAL DISC SURGERY     cholecystectomy     coil to brain aneurysm     COLONOSCOPY     ELBOW SURGERY Bilateral    tendonitis   IR ANGIO INTRA EXTRACRAN SEL COM CAROTID INNOMINATE BILAT MOD SED  07/31/2017   IR ANGIO INTRA EXTRACRAN SEL COM CAROTID INNOMINATE BILAT MOD  SED  12/30/2019   IR ANGIO VERTEBRAL SEL VERTEBRAL BILAT MOD SED  07/31/2017   IR ANGIO VERTEBRAL SEL VERTEBRAL BILAT MOD SED  12/30/2019   NECK SURGERY     OOPHORECTOMY     RADIOLOGY WITH ANESTHESIA N/A 05/10/2013   Procedure: RADIOLOGY WITH ANESTHESIA;  Surgeon: Rob Hickman, MD;  Location: [place];  Service: Radiology;  Laterality: N/A;   SHOULDER SURGERY Left    TUBAL LIGATION     VESICOVAGINAL FISTULA CLOSURE W/ TAH      reports that she has been smoking cigarettes. She has a 52.00 pack-year smoking history. She has never used smokeless tobacco. She reports that she does not drink alcohol and does not use drugs. family history includes Cancer in her father, mother, sister, and another family member; Coronary artery disease in an other family member; Diabetes in an other family member; Hypertension in an other family member; Leukemia in her sister; Seizures in an other family member. Allergies  Allergen Reactions   Ambien [Zolpidem Tartrate] Other (See Comments)    Memory issues   Hydrocodone Nausea Only  Can take it if its in a cough syrup    Lactose Intolerance (Gi)     Bloating, upset stomach    Nsaids     GI irritation, pt tolerates meloxicam and diclofenac    Propoxyphene N-Acetaminophen Itching   Tramadol Nausea And Vomiting   Chantix [Varenicline] Other (See Comments)    "Makes bipolar worse" agitation, depression, sick to stomach   Current Outpatient Medications on File Prior to Visit  Medication Sig Dispense Refill   aspirin 325 MG tablet Take 325 mg by mouth daily.     atorvastatin (LIPITOR) 40 MG tablet TAKE 1 TABLET BY MOUTH  DAILY 90 tablet 0   buPROPion (WELLBUTRIN XL) 150 MG 24 hr tablet Take 1 tablet (150 mg total) by mouth daily. Take 1 po with 300 mg= 450 mg/day. 90 tablet 1   buPROPion (WELLBUTRIN XL) 300 MG 24 hr tablet Take 1 tablet (300 mg total) by mouth daily. 90 tablet 1   Cholecalciferol (VITAMIN D) 50 MCG (2000 UT) tablet Take 2,000 Units by mouth  daily.     fexofenadine (ALLEGRA) 180 MG tablet Take 180 mg by mouth daily. (Patient not taking: Reported on 02/01/2021)     gabapentin (NEURONTIN) 800 MG tablet TAKE 1 TABLET BY MOUTH 3  TIMES DAILY 270 tablet 0   lamoTRIgine (LAMICTAL) 100 MG tablet TAKE 1 TABLET BY MOUTH IN  THE MORNING AND 3 TABLETS  BY MOUTH AT BEDTIME 360 tablet 3   naloxone (NARCAN) 4 MG/0.1ML LIQD nasal spray kit Place 0.4 mg into the nose as needed (opioid overdose).      Oxycodone HCl 20 MG TABS Take 1 tablet by mouth every 6 (six) hours as needed (pain).      pantoprazole (PROTONIX) 40 MG tablet Take 40 mg by mouth 2 (two) times daily.     potassium chloride (KLOR-CON) 10 MEQ tablet TAKE 1 TABLET BY MOUTH  DAILY 90 tablet 3   promethazine (PHENERGAN) 12.5 MG tablet Take 1 tablet by mouth  every 6 hours as needed for nausea (Patient taking differently: Take 12.5 mg by mouth every 6 (six) hours as needed for nausea.) 90 tablet 0   risperidone (RISPERDAL) 4 MG tablet TAKE 1 TABLET BY MOUTH IN  THE MORNING THEN 1 AND 1/2  TABLETS BY MOUTH AT NIGHT 225 tablet 3   traZODone (DESYREL) 50 MG tablet Take 0.5-2 tablets (25-100 mg total) by mouth at bedtime. 60 tablet 1   vitamin B-12 (CYANOCOBALAMIN) 1000 MCG tablet Take 1 tablet (1,000 mcg total) by mouth daily. 90 tablet 3   No current facility-administered medications on file prior to visit.        ROS:  All others reviewed and negative.  Objective        PE:  There were no vitals taken for this visit.                Constitutional: Pt appears in NAD               HENT: Head: NCAT.                Right Ear: External ear normal.                 Left Ear: External ear normal.                Eyes: . Pupils are equal, round, and reactive to light. Conjunctivae and EOM are normal  Nose: without d/c or deformity               Neck: Neck supple. Gross normal ROM               Cardiovascular: Normal rate and regular rhythm.                 Pulmonary/Chest: Effort  normal and breath sounds without rales or wheezing.                Abd:  Soft, NT, ND, + BS, no organomegaly               Neurological: Pt is alert. At baseline orientation, motor grossly intact               Skin: Skin is warm. No rashes, no other new lesions, LE edema - none               Psychiatric: Pt behavior is normal without agitation   Micro: none  Cardiac tracings I have personally interpreted today:  none  Pertinent Radiological findings (summarize): none   Lab Results  Component Value Date   WBC 6.7 02/09/2021   HGB 13.9 02/09/2021   HCT 41.9 02/09/2021   PLT 288.0 02/09/2021   GLUCOSE 82 02/09/2021   CHOL 162 08/08/2020   TRIG 72.0 08/08/2020   HDL 54.00 08/08/2020   LDLDIRECT 102.7 08/21/2011   LDLCALC 94 08/08/2020   ALT 8 02/09/2021   AST 10 02/09/2021   NA 132 (L) 02/09/2021   K 3.9 02/09/2021   CL 96 02/09/2021   CREATININE 0.96 02/09/2021   BUN 9 02/09/2021   CO2 29 02/09/2021   TSH 1.21 02/09/2021   INR 0.9 12/30/2019   HGBA1C 5.6 08/08/2020   Assessment/Plan:  [name] is a 65 y.o. White or Caucasian [1] female with  has a past medical history of Allergic rhinitis, Arthritis, Brain aneurysm, Chronic abdominal pain, COPD (chronic obstructive pulmonary disease) ([place]), Depression, Fibromyalgia, Gastroparesis, GERD (gastroesophageal reflux disease), H/O hiatal hernia, History of colonic polyps, HLD (hyperlipidemia), IBS (irritable bowel syndrome), Migraines, OCD (obsessive compulsive disorder), and Peripheral neuropathy.  B12 deficiency Lab Results  Component Value Date   VITAMINB12 91 (L) 08/08/2020   Low, to start oral replacement - b12 1000 mcg qd   WEIGHT LOSS Also for cxr today, consider CT chest given hx of smoking  Vitamin D deficiency Last vitamin D Lab Results  Component Value Date   VD25OH 39.09 08/08/2020   Low normal, to start oral replacement   Smoker Counseled to quit, pt not ready  Followup: Return in about 3 months  (around 05/12/2021).  Cathlean Cower, MD 02/11/2021 9:30 PM [facility]

## 2021-02-09 NOTE — Patient Instructions (Signed)
Please quit smoking  Please take OTC Vitamin D3 at 2000 units per day, indefinitely  Please take the B12 at 1000 mcg per day  Please continue all other medications as before, and refills have been done if requested.  Please have the pharmacy call with any other refills you may need.  Please continue your efforts at being more active, low cholesterol diet, and weight control.  Please keep your appointments with your specialists as you may have planned  Please go to the XRAY Department in the first floor for the x-ray testing  Please go to the LAB at the blood drawing area for the tests to be done  You will be contacted by phone if any changes need to be made immediately.  Otherwise, you will receive a letter about your results with an explanation, but please check with MyChart first.  Please remember to sign up for MyChart if you have not done so, as this will be important to you in the future with finding out test results, communicating by private email, and scheduling acute appointments online when needed.

## 2021-02-09 NOTE — Progress Notes (Signed)
Subjective:   [name] is a 65 y.o. female who presents for Medicare Annual (Subsequent) preventive examination.  Review of Systems     Cardiac Risk Factors include: dyslipidemia;family history of premature cardiovascular disease     Objective:    Today's Vitals   02/09/21 1108  BP: 118/80  Pulse: 84  Resp: 16  Temp: 98.3 F (36.8 C)  TempSrc: Oral  SpO2: 95%  Weight: 123 lb 12.8 oz (56.2 kg)  Height: _0  (1.651 m)  PainSc: 0-No pain   Body mass index is 20.6 kg/m.  Advanced Directives 02/09/2021 12/30/2019 12/17/2019 07/31/2017 05/10/2013 05/05/2013 05/03/2013  Does Patient Have a Medical Advance Directive? No No No No Patient does not have advance directive - Patient does not have advance directive  Would patient like information on creating a medical advance directive? No - Patient declined No - Patient declined No - Patient declined - - - -  Pre-existing out of facility DNR order (yellow form or pink MOST form) - - - - No No -    Current Medications (verified) Outpatient Encounter Medications as of 02/09/2021  Medication Sig   aspirin 325 MG tablet Take 325 mg by mouth daily.   atorvastatin (LIPITOR) 40 MG tablet TAKE 1 TABLET BY MOUTH  DAILY   buPROPion (WELLBUTRIN XL) 150 MG 24 hr tablet Take 1 tablet (150 mg total) by mouth daily. Take 1 po with 300 mg= 450 mg/day.   buPROPion (WELLBUTRIN XL) 300 MG 24 hr tablet Take 1 tablet (300 mg total) by mouth daily.   Cholecalciferol (VITAMIN D) 50 MCG (2000 UT) tablet Take 2,000 Units by mouth daily.   fexofenadine (ALLEGRA) 180 MG tablet Take 180 mg by mouth daily. (Patient not taking: Reported on 02/01/2021)   gabapentin (NEURONTIN) 800 MG tablet TAKE 1 TABLET BY MOUTH 3  TIMES DAILY   lamoTRIgine (LAMICTAL) 100 MG tablet TAKE 1 TABLET BY MOUTH IN  THE MORNING AND 3 TABLETS  BY MOUTH AT BEDTIME   naloxone (NARCAN) 4 MG/0.1ML LIQD nasal spray kit Place 0.4 mg into the nose as needed (opioid overdose).    Oxycodone HCl  20 MG TABS Take 1 tablet by mouth every 6 (six) hours as needed (pain).    pantoprazole (PROTONIX) 40 MG tablet Take 40 mg by mouth 2 (two) times daily.   potassium chloride (KLOR-CON) 10 MEQ tablet TAKE 1 TABLET BY MOUTH  DAILY   promethazine (PHENERGAN) 12.5 MG tablet Take 1 tablet by mouth  every 6 hours as needed for nausea (Patient taking differently: Take 12.5 mg by mouth every 6 (six) hours as needed for nausea.)   risperidone (RISPERDAL) 4 MG tablet TAKE 1 TABLET BY MOUTH IN  THE MORNING THEN 1 AND 1/2  TABLETS BY MOUTH AT NIGHT   traZODone (DESYREL) 50 MG tablet Take 0.5-2 tablets (25-100 mg total) by mouth at bedtime.   vitamin B-12 (CYANOCOBALAMIN) 1000 MCG tablet Take 1 tablet (1,000 mcg total) by mouth daily.   No facility-administered encounter medications on file as of 02/09/2021.    Allergies (verified) Ambien [zolpidem tartrate], Hydrocodone, Lactose intolerance (gi), Nsaids, Propoxyphene n-acetaminophen, Tramadol, and Chantix [varenicline]   History: Past Medical History:  Diagnosis Date   Allergic rhinitis    Arthritis    Brain aneurysm    Chronic abdominal pain    COPD (chronic obstructive pulmonary disease) ([place])    denies   Depression    bipolar   Fibromyalgia    Gastroparesis  GERD (gastroesophageal reflux disease)    H/O hiatal hernia    History of colonic polyps    HLD (hyperlipidemia)    IBS (irritable bowel syndrome)    chronic constipation   Migraines    OCD (obsessive compulsive disorder)    anxiety   Peripheral neuropathy    Past Surgical History:  Procedure Laterality Date   ABDOMINAL HYSTERECTOMY     APPENDECTOMY     CERVICAL DISC SURGERY     cholecystectomy     coil to brain aneurysm     COLONOSCOPY     ELBOW SURGERY Bilateral    tendonitis   IR ANGIO INTRA EXTRACRAN SEL COM CAROTID INNOMINATE BILAT MOD SED  07/31/2017   IR ANGIO INTRA EXTRACRAN SEL COM CAROTID INNOMINATE BILAT MOD SED  12/30/2019   IR ANGIO VERTEBRAL SEL VERTEBRAL  BILAT MOD SED  07/31/2017   IR ANGIO VERTEBRAL SEL VERTEBRAL BILAT MOD SED  12/30/2019   NECK SURGERY     OOPHORECTOMY     RADIOLOGY WITH ANESTHESIA N/A 05/10/2013   Procedure: RADIOLOGY WITH ANESTHESIA;  Surgeon: Rob Hickman, MD;  Location: [facility];  Service: Radiology;  Laterality: N/A;   SHOULDER SURGERY Left    TUBAL LIGATION     VESICOVAGINAL FISTULA CLOSURE W/ TAH     Family History  Problem Relation Age of Onset   Cancer Father        lung   Cancer Mother        brain   Coronary artery disease Other        family hx   Cancer Other        family hx   Diabetes Other        DM - family hx   Hypertension Other        family hx   Seizures Other        family hx   Cancer Sister        stomach   Leukemia Sister    Colon cancer Neg Hx    Social History   Socioeconomic History   Marital status: Widowed    Spouse name: Not on file   Number of children: Not on file   Years of education: Not on file   Highest education level: Not on file  Occupational History    Employer: DISABLED  Tobacco Use   Smoking status: Every Day    Packs/day: 2.00    Years: 26.00    Pack years: 52.00    Types: Cigarettes   Smokeless tobacco: Never  Vaping Use   Vaping Use: Never used  Substance and Sexual Activity   Alcohol use: No   Drug use: No   Sexual activity: Not Currently  Other Topics Concern   Not on file  Social History Narrative   Widow. Does not work. Drinks coffee in AM    Social Determinants of Health   Financial Resource Strain: Low Risk    Difficulty of Paying Living Expenses: Not hard at all  Food Insecurity: No Food Insecurity   Worried About Charity fundraiser in the Last Year: Never true   Arboriculturist in the Last Year: Never true  Transportation Needs: No Transportation Needs   Lack of Transportation (Medical): No   Lack of Transportation (Non-Medical): No  Physical Activity: Inactive   Days of Exercise per Week: 0 days   Minutes of Exercise per  Session: 0 min  Stress: No Stress Concern Present  Feeling of Stress : Not at all  Social Connections: Moderately Integrated   Frequency of Communication with Friends and Family: More than three times a week   Frequency of Social Gatherings with Friends and Family: Three times a week   Attends Religious Services: More than 4 times per year   Active Member of Clubs or Organizations: No   Attends Archivist Meetings: More than 4 times per year   Marital Status: Widowed    Tobacco Counseling Ready to quit: Not Answered Counseling given: Not Answered   Clinical Intake:  Pre-visit preparation completed: Yes  Pain : No/denies pain Pain Score: 0-No pain     BMI - recorded: 20.6 Nutritional Status: BMI of 19-24  Normal Nutritional Risks: None Diabetes: No  How often do you need to have someone help you when you read instructions, pamphlets, or other written materials from your doctor or pharmacy?: 1 - Never What is the last grade level you completed in school?: High School Graduate  Diabetic? no  Interpreter Needed?: No  Information entered by :: Lisette Abu, LPN   Activities of Daily Living In your present state of health, do you have any difficulty performing the following activities: 02/09/2021  Hearing? N  Vision? N  Difficulty concentrating or making decisions? Y  Walking or climbing stairs? N  Dressing or bathing? N  Doing errands, shopping? Y  Preparing Food and eating ? N  Using the Toilet? N  In the past six months, have you accidently leaked urine? N  Do you have problems with loss of bowel control? N  Managing your Medications? N  Managing your Finances? N  Housekeeping or managing your Housekeeping? N  Some recent data might be hidden    Patient Care Team: Biagio Borg, MD as PCP - General  Indicate any recent Medical Services you may have received from other than [facility] providers in the past year (date may be approximate).      Assessment:   This is a routine wellness examination for [place].  Hearing/Vision screen No results found.  Dietary issues and exercise activities discussed: Current Exercise Habits: The patient does not participate in regular exercise at present, Exercise limited by: psychological condition(s);orthopedic condition(s);respiratory conditions(s)   Goals Addressed               This Visit's Progress     Cut back on smoking (pt-stated)        Depression Screen PHQ 2/9 Scores 02/09/2021 08/08/2020 08/08/2020 12/07/2019 12/07/2019 07/15/2019 07/15/2019  PHQ - 2 Score 0 1 0 0 0 1 3  PHQ- 9 Score - - 0 0 - - 9    Fall Risk Fall Risk  02/09/2021 08/08/2020 12/07/2019 07/15/2019 07/15/2019  Falls in the past year? 1 0 1 1 -  Number falls in past yr: 1 - 1 0 1  Injury with Fall? 0 - 1 0 1  Comment - - - - -  Risk for fall due to : Medication side effect;Orthopedic patient - History of fall(s);Impaired balance/gait - -  Follow up Falls evaluation completed - Falls evaluation completed - -    FALL RISK PREVENTION PERTAINING TO THE HOME:  Any stairs in or around the home? No  If so, are there any without handrails? No  Home free of loose throw rugs in walkways, pet beds, electrical cords, etc? Yes  Adequate lighting in your home to reduce risk of falls? Yes   ASSISTIVE DEVICES UTILIZED TO PREVENT FALLS:  Life alert? No  Use of a cane, walker or w/c? Yes  Grab bars in the bathroom? Yes  Shower chair or bench in shower? Yes  Elevated toilet seat or a handicapped toilet? No   TIMED UP AND GO:  Was the test performed? Yes .  Length of time to ambulate 10 feet: 7 sec.   Gait steady and fast without use of assistive device  Cognitive Function: Normal cognitive status assessed by direct observation by this Nurse Health Advisor. No abnormalities found.          Immunizations Immunization History  Administered Date(s) Administered   H1N1 07/11/2008   Influenza Split 02/20/2012,  02/27/2014   Influenza Whole 05/21/2007, 04/15/2008, 03/16/2009, 02/21/2010   Influenza,inj,Quad PF,6+ Mos 03/31/2015, 03/01/2016   Influenza,inj,quad, With Preservative 03/21/2018, 03/05/2019   Influenza-Unspecified 01/15/2014, 04/17/2020   Moderna Sars-Covid-2 Vaccination 10/05/2019, 11/04/2019, 05/17/2020   Pneumococcal Conjugate-13 03/19/2016   Pneumococcal Polysaccharide-23 05/01/2015, 08/08/2020   Td 01/18/2008   Tdap 11/26/2018    TDAP status: Up to date  Flu Vaccine status: Up to date  Pneumococcal vaccine status: Up to date  Covid-19 vaccine status: Completed vaccines  Qualifies for Shingles Vaccine? Yes   Zostavax completed No   Shingrix Completed?: No.    Education has been provided regarding the importance of this vaccine. Patient has been advised to call insurance company to determine out of pocket expense if they have not yet received this vaccine. Advised may also receive vaccine at local pharmacy or Health Dept. Verbalized acceptance and understanding.  Screening Tests Health Maintenance  Topic Date Due   Zoster Vaccines- Shingrix (1 of 2) Never done   COLONOSCOPY (Pts 45-79yr Insurance coverage will need to be confirmed)  01/27/2018   COVID-19 Vaccine (4 - Booster for Moderna series) 08/15/2020   INFLUENZA VACCINE  01/15/2021   MAMMOGRAM  10/17/2021   Pneumococcal Vaccine [id number] old (4 - PPSV23 or PCV20) 08/08/2025   TETANUS/TDAP  11/25/2028   Hepatitis C Screening  Completed   HIV Screening  Completed   HPV VACCINES  Aged Out    Health Maintenance  Health Maintenance Due  Topic Date Due   Zoster Vaccines- Shingrix (1 of 2) Never done   COLONOSCOPY (Pts 45-427yrInsurance coverage will need to be confirmed)  01/27/2018   COVID-19 Vaccine (4 - Booster for Moderna series) 08/15/2020   INFLUENZA VACCINE  01/15/2021    Colorectal cancer screening: Type of screening: Colonoscopy. Completed 01/27/2017. Repeat every 1 years  Mammogram status: Completed  10/18/2019. Repeat every year  Bone density status: never done  Lung Cancer Screening: (Low Dose CT Chest recommended if Age 63-80ears, 30 pack-year currently smoking OR have quit w/in 15years.) does qualify.   Lung Cancer Screening Referral: no  Additional Screening:  Hepatitis C Screening: does qualify; Completed yes  Vision Screening: Recommended annual ophthalmology exams for early detection of glaucoma and other disorders of the eye. Is the patient up to date with their annual eye exam?  Yes  Who is the provider or what is the name of the office in which the patient attends annual eye exams? [facility], would they like to be referred to a provider to establish care? No .   Dental Screening: Recommended annual dental exams for proper oral hygiene  Community Resource Referral / Chronic Care Management: CRR required this visit?  No   CCM required this visit?  No      Plan:  I have personally reviewed and noted the following in the patient's chart:   Medical and social history Use of alcohol, tobacco or illicit drugs  Current medications and supplements including opioid prescriptions.  Functional ability and status Nutritional status Physical activity Advanced directives List of other physicians Hospitalizations, surgeries, and ER visits in previous 12 months Vitals Screenings to include cognitive, depression, and falls Referrals and appointments  In addition, I have reviewed and discussed with patient certain preventive protocols, quality metrics, and best practice recommendations. A written personalized care plan for preventive services as well as general preventive health recommendations were provided to patient.     Sheral Flow, LPN   10/03/3788   Nurse Notes: n/a

## 2021-02-09 NOTE — Assessment & Plan Note (Addendum)
Also for cxr today, consider CT chest given hx of smoking

## 2021-02-09 NOTE — Assessment & Plan Note (Signed)
Lab Results  Component Value Date   VITAMINB12 91 (L) 08/08/2020   Low, to start oral replacement - b12 1000 mcg qd

## 2021-02-09 NOTE — Patient Instructions (Signed)
[name] , Thank you for taking time to come for your Medicare Wellness Visit. I appreciate your ongoing commitment to your health goals. Please review the following plan we discussed and let me know if I can assist you in the future.   Screening recommendations/referrals: Colonoscopy: 01/27/2017; due every 1 year Mammogram: 10/18/2019; due every 1-2 years Bone Density: never done Recommended yearly ophthalmology/optometry visit for glaucoma screening and checkup Recommended yearly dental visit for hygiene and checkup  Vaccinations: Influenza vaccine: 04/17/2020 Pneumococcal vaccine: 03/19/2016, 08/08/2020 Tdap vaccine: 11/26/2018; due every 10 years Shingles vaccine: never done  Covid-19: 10/05/2019, 11/04/2019, 05/17/2020  Advanced directives: Advance directive discussed with you today. Even though you declined this today please call our office should you change your mind and we can give you the proper paperwork for you to fill out.  Conditions/risks identified: Yes; Client understands the importance of follow-up with providers by attending scheduled visits and discussed goals to eat healthier, increase physical activity, exercise the brain, socialize more, get enough sleep and make time for laughter.  Next appointment: Please schedule your next Medicare Wellness Visit with your Nurse Health Advisor in 1 year by calling [phone].  Preventive Care 40-64 Years, Female Preventive care refers to lifestyle choices and visits with your health care provider that can promote health and wellness. What does preventive care include? A yearly physical exam. This is also called an annual well check. Dental exams once or twice a year. Routine eye exams. Ask your health care provider how often you should have your eyes checked. Personal lifestyle choices, including: Daily care of your teeth and gums. Regular physical activity. Eating a healthy diet. Avoiding tobacco and drug use. Limiting alcohol  use. Practicing safe sex. Taking low-dose aspirin daily starting at age 53. Taking vitamin and mineral supplements as recommended by your health care provider. What happens during an annual well check? The services and screenings done by your health care provider during your annual well check will depend on your age, overall health, lifestyle risk factors, and family history of disease. Counseling  Your health care provider may ask you questions about your: Alcohol use. Tobacco use. Drug use. Emotional well-being. Home and relationship well-being. Sexual activity. Eating habits. Work and work Statistician. Method of birth control. Menstrual cycle. Pregnancy history. Screening  You may have the following tests or measurements: Height, weight, and BMI. Blood pressure. Lipid and cholesterol levels. These may be checked every 5 years, or more frequently if you are over 70 years old. Skin check. Lung cancer screening. You may have this screening every year starting at age 90 if you have a 30-pack-year history of smoking and currently smoke or have quit within the past 15 years. Fecal occult blood test (FOBT) of the stool. You may have this test every year starting at age 70. Flexible sigmoidoscopy or colonoscopy. You may have a sigmoidoscopy every 5 years or a colonoscopy every 10 years starting at age 35. Hepatitis C blood test. Hepatitis B blood test. Sexually transmitted disease (STD) testing. Diabetes screening. This is done by checking your blood sugar (glucose) after you have not eaten for a while (fasting). You may have this done every 1-3 years. Mammogram. This may be done every 1-2 years. Talk to your health care provider about when you should start having regular mammograms. This may depend on whether you have a family history of breast cancer. BRCA-related cancer screening. This may be done if you have a family history of breast, ovarian, tubal, or  peritoneal cancers. Pelvic  exam and Pap test. This may be done every 3 years starting at age 67. Starting at age 76, this may be done every 5 years if you have a Pap test in combination with an HPV test. Bone density scan. This is done to screen for osteoporosis. You may have this scan if you are at high risk for osteoporosis. Discuss your test results, treatment options, and if necessary, the need for more tests with your health care provider. Vaccines  Your health care provider may recommend certain vaccines, such as: Influenza vaccine. This is recommended every year. Tetanus, diphtheria, and acellular pertussis (Tdap, Td) vaccine. You may need a Td booster every 10 years. Zoster vaccine. You may need this after age 32. Pneumococcal 13-valent conjugate (PCV13) vaccine. You may need this if you have certain conditions and were not previously vaccinated. Pneumococcal polysaccharide (PPSV23) vaccine. You may need one or two doses if you smoke cigarettes or if you have certain conditions. Talk to your health care provider about which screenings and vaccines you need and how often you need them. This information is not intended to replace advice given to you by your health care provider. Make sure you discuss any questions you have with your health care provider. Document Released: 06/30/2015 Document Revised: 02/21/2016 Document Reviewed: 04/04/2015 Elsevier Interactive Patient Education  2017 [place] Prevention in the Home Falls can cause injuries. They can happen to people of all ages. There are many things you can do to make your home safe and to help prevent falls. What can I do on the outside of my home? Regularly fix the edges of walkways and driveways and fix any cracks. Remove anything that might make you trip as you walk through a door, such as a raised step or threshold. Trim any bushes or trees on the path to your home. Use bright outdoor lighting. Clear any walking paths of anything that might  make someone trip, such as rocks or tools. Regularly check to see if handrails are loose or broken. Make sure that both sides of any steps have handrails. Any raised decks and porches should have guardrails on the edges. Have any leaves, snow, or ice cleared regularly. Use sand or salt on walking paths during winter. Clean up any spills in your garage right away. This includes oil or grease spills. What can I do in the bathroom? Use night lights. Install grab bars by the toilet and in the tub and shower. Do not use towel bars as grab bars. Use non-skid mats or decals in the tub or shower. If you need to sit down in the shower, use a plastic, non-slip stool. Keep the floor dry. Clean up any water that spills on the floor as soon as it happens. Remove soap buildup in the tub or shower regularly. Attach bath mats securely with double-sided non-slip rug tape. Do not have throw rugs and other things on the floor that can make you trip. What can I do in the bedroom? Use night lights. Make sure that you have a light by your bed that is easy to reach. Do not use any sheets or blankets that are too big for your bed. They should not hang down onto the floor. Have a firm chair that has side arms. You can use this for support while you get dressed. Do not have throw rugs and other things on the floor that can make you trip. What can I do in  the kitchen? Clean up any spills right away. Avoid walking on wet floors. Keep items that you use a lot in easy-to-reach places. If you need to reach something above you, use a strong step stool that has a grab bar. Keep electrical cords out of the way. Do not use floor polish or wax that makes floors slippery. If you must use wax, use non-skid floor wax. Do not have throw rugs and other things on the floor that can make you trip. What can I do with my stairs? Do not leave any items on the stairs. Make sure that there are handrails on both sides of the stairs  and use them. Fix handrails that are broken or loose. Make sure that handrails are as long as the stairways. Check any carpeting to make sure that it is firmly attached to the stairs. Fix any carpet that is loose or worn. Avoid having throw rugs at the top or bottom of the stairs. If you do have throw rugs, attach them to the floor with carpet tape. Make sure that you have a light switch at the top of the stairs and the bottom of the stairs. If you do not have them, ask someone to add them for you. What else can I do to help prevent falls? Wear shoes that: Do not have high heels. Have rubber bottoms. Are comfortable and fit you well. Are closed at the toe. Do not wear sandals. If you use a stepladder: Make sure that it is fully opened. Do not climb a closed stepladder. Make sure that both sides of the stepladder are locked into place. Ask someone to hold it for you, if possible. Clearly mark and make sure that you can see: Any grab bars or handrails. First and last steps. Where the edge of each step is. Use tools that help you move around (mobility aids) if they are needed. These include: Canes. Walkers. Scooters. Crutches. Turn on the lights when you go into a dark area. Replace any light bulbs as soon as they burn out. Set up your furniture so you have a clear path. Avoid moving your furniture around. If any of your floors are uneven, fix them. If there are any pets around you, be aware of where they are. Review your medicines with your doctor. Some medicines can make you feel dizzy. This can increase your chance of falling. Ask your doctor what other things that you can do to help prevent falls. This information is not intended to replace advice given to you by your health care provider. Make sure you discuss any questions you have with your health care provider. Document Released: 03/30/2009 Document Revised: 11/09/2015 Document Reviewed: 07/08/2014 Elsevier Interactive Patient  Education  2017 [employer].

## 2021-02-10 ENCOUNTER — Encounter: Payer: Self-pay | Admitting: Internal Medicine

## 2021-02-11 ENCOUNTER — Encounter: Payer: Self-pay | Admitting: Internal Medicine

## 2021-02-11 NOTE — Assessment & Plan Note (Signed)
Last vitamin D Lab Results  Component Value Date   VD25OH 39.09 08/08/2020   Low normal, to start oral replacement

## 2021-02-11 NOTE — Assessment & Plan Note (Signed)
Counseled to quit, pt not ready 

## 2021-02-14 DIAGNOSIS — M542 Cervicalgia: Secondary | ICD-10-CM | POA: Diagnosis not present

## 2021-02-14 DIAGNOSIS — M5136 Other intervertebral disc degeneration, lumbar region: Secondary | ICD-10-CM | POA: Diagnosis not present

## 2021-02-14 DIAGNOSIS — Z79891 Long term (current) use of opiate analgesic: Secondary | ICD-10-CM | POA: Diagnosis not present

## 2021-02-14 DIAGNOSIS — M4726 Other spondylosis with radiculopathy, lumbar region: Secondary | ICD-10-CM | POA: Diagnosis not present

## 2021-02-14 DIAGNOSIS — M961 Postlaminectomy syndrome, not elsewhere classified: Secondary | ICD-10-CM | POA: Diagnosis not present

## 2021-02-16 ENCOUNTER — Telehealth: Payer: Self-pay | Admitting: Physician Assistant

## 2021-02-16 NOTE — Telephone Encounter (Signed)
Please review

## 2021-02-16 NOTE — Telephone Encounter (Signed)
Pt informed

## 2021-02-16 NOTE — Telephone Encounter (Signed)
Stop Trazodone. If symptoms worsen with visual abnormalities, imbalance, tremor, slurred speech, numbness, tingling, or any other neurologic changes, go to the emergency room immediately.  If her symptoms are from the trazodone, it should wear off within 24 hours.  If not, even if symptoms are not worse she should go to the ER.

## 2021-02-16 NOTE — Telephone Encounter (Addendum)
Pt called reporting since she started Trazodone she has double vision, shaking and fell. Please advise Pt @ [phone]. Apt 10/18 Please call before 12:00 or after 4:00.

## 2021-03-13 ENCOUNTER — Other Ambulatory Visit: Payer: Self-pay | Admitting: Physician Assistant

## 2021-03-13 ENCOUNTER — Other Ambulatory Visit: Payer: Self-pay | Admitting: Internal Medicine

## 2021-03-13 NOTE — Telephone Encounter (Signed)
Please refill as per office routine med refill policy (all routine meds to be refilled for 3 mo or monthly (per pt preference) up to one year from last visit, then month to month grace period for 3 mo, then further med refills will have to be denied) ? ?

## 2021-04-02 ENCOUNTER — Other Ambulatory Visit: Payer: Self-pay

## 2021-04-02 ENCOUNTER — Ambulatory Visit: Payer: Medicare Other | Admitting: Physician Assistant

## 2021-04-02 ENCOUNTER — Encounter: Payer: Self-pay | Admitting: Physician Assistant

## 2021-04-02 DIAGNOSIS — F3010 Manic episode without psychotic symptoms, unspecified: Secondary | ICD-10-CM

## 2021-04-02 DIAGNOSIS — F411 Generalized anxiety disorder: Secondary | ICD-10-CM

## 2021-04-02 DIAGNOSIS — F17200 Nicotine dependence, unspecified, uncomplicated: Secondary | ICD-10-CM

## 2021-04-02 DIAGNOSIS — F5105 Insomnia due to other mental disorder: Secondary | ICD-10-CM

## 2021-04-02 DIAGNOSIS — F99 Mental disorder, not otherwise specified: Secondary | ICD-10-CM

## 2021-04-02 MED ORDER — RISPERIDONE 4 MG PO TABS: 6.0000 mg | ORAL_TABLET | Freq: Two times a day (BID) | ORAL | 1 refills | Status: DC

## 2021-04-02 NOTE — Progress Notes (Signed)
[facility]  Patient ID: [name],  MRN: [id number]  PCP: Biagio Borg, MD  Date of Evaluation: 04/02/2021 Time spent:40 minutes  Chief Complaint:  Chief Complaint   Depression; Insomnia; Follow-up     HISTORY/CURRENT STATUS: HPI For routine med check.  Her friend [name] is with her today.  At the last visit 2 months ago she was prescribed trazodone.  She called a few weeks later and said that she was seeing double, had generalized shaking and she fell 1 time, no injuries.  She stopped that.  Still having trouble sleeping but now taking Tylenol PM which is helping some.  [name] states she's still manic. Going on for about 3 months. She's more talkative, not spending a lot 'b/c I can't afford it.' Has cut way back on smoking so not spending money on that now. Sleeps only 4 hours or so. Cleans during the middle of the night. Sorts her kitchen. Organizes her clothes, got rid of clothes she doesn't wear. Has racing thoughts, no irritability or increased anger. Is grandiose. No paranoia, no AH/VH.   Patient denies loss of interest in usual activities and is able to enjoy things.  Denies decreased energy or motivation.  Appetite has not changed.  No extreme sadness, tearfulness, or feelings of hopelessness.  Denies any changes in concentration, making decisions or remembering things.  Denies suicidal or homicidal thoughts.  Denies dizziness, syncope, seizures, numbness, tingling, tremor, tics, unsteady gait, slurred speech, confusion. Has chronic back pain. On oxycodone. Denies unexplained weight loss, frequent infections, or sores that heal slowly.  No polyphagia, polydipsia, or polyuria. Denies visual changes or paresthesias.   Individual Medical History/ Review of Systems: Changes? :No    Past medications for mental health diagnoses include: Ingrezza was ineffective, Lamictal, Risperdal, Zyprexa, Cogentin, Latuda, Cymbalta, lithium, Ambien, prazosin, Ativan, Wellbutrin,  Zoloft, and probably others, trazodone possibly caused double vision and a fall.  Allergies: Ambien [zolpidem tartrate], Hydrocodone, Lactose intolerance (gi), Nsaids, Propoxyphene n-acetaminophen, Tramadol, and Chantix [varenicline]  Current Medications:  Current Outpatient Medications:    aspirin 325 MG tablet, Take 325 mg by mouth daily., Disp: , Rfl:    atorvastatin (LIPITOR) 40 MG tablet, TAKE 1 TABLET BY MOUTH  DAILY, Disp: 90 tablet, Rfl: 3   buPROPion (WELLBUTRIN XL) 150 MG 24 hr tablet, TAKE 1 TABLET BY MOUTH  DAILY WITH 300 MG TO TOTAL  450 MG DAILY, Disp: 90 tablet, Rfl: 0   buPROPion (WELLBUTRIN XL) 300 MG 24 hr tablet, Take 1 tablet (300 mg total) by mouth daily., Disp: 90 tablet, Rfl: 1   Cholecalciferol (VITAMIN D) 50 MCG (2000 UT) tablet, Take 2,000 Units by mouth daily., Disp: , Rfl:    diphenhydramine-acetaminophen (TYLENOL PM) 25-500 MG TABS tablet, Take 1 tablet by mouth at bedtime as needed., Disp: , Rfl:    gabapentin (NEURONTIN) 800 MG tablet, TAKE 1 TABLET BY MOUTH 3  TIMES DAILY (Patient taking differently: 800 mg 2 (two) times daily. TAKE 1 TABLET BY MOUTH 3  TIMES DAILY), Disp: 270 tablet, Rfl: 0   lamoTRIgine (LAMICTAL) 100 MG tablet, TAKE 1 TABLET BY MOUTH IN  THE MORNING AND 3 TABLETS  BY MOUTH AT BEDTIME, Disp: 360 tablet, Rfl: 3   naloxone (NARCAN) 4 MG/0.1ML LIQD nasal spray kit, Place 0.4 mg into the nose as needed (opioid overdose). , Disp: , Rfl:    Oxycodone HCl 20 MG TABS, Take 1 tablet by mouth every 6 (six) hours as needed (pain). , Disp: ,  Rfl:    pantoprazole (PROTONIX) 40 MG tablet, Take 40 mg by mouth 2 (two) times daily., Disp: , Rfl:    potassium chloride (KLOR-CON) 10 MEQ tablet, TAKE 1 TABLET BY MOUTH  DAILY, Disp: 90 tablet, Rfl: 3   promethazine (PHENERGAN) 12.5 MG tablet, Take 1 tablet by mouth  every 6 hours as needed for nausea (Patient taking differently: Take 12.5 mg by mouth every 6 (six) hours as needed for nausea.), Disp: 90 tablet, Rfl: 0    risperidone (RISPERDAL) 4 MG tablet, TAKE 1 TABLET BY MOUTH IN  THE MORNING THEN 1 AND 1/2  TABLETS BY MOUTH AT NIGHT, Disp: 225 tablet, Rfl: 3   risperidone (RISPERDAL) 4 MG tablet, Take 1.5 tablets (6 mg total) by mouth 2 (two) times daily., Disp: 90 tablet, Rfl: 1   vitamin B-12 (CYANOCOBALAMIN) 1000 MCG tablet, Take 1 tablet (1,000 mcg total) by mouth daily., Disp: 90 tablet, Rfl: 3   fexofenadine (ALLEGRA) 180 MG tablet, Take 180 mg by mouth daily. (Patient not taking: No sig reported), Disp: , Rfl:  Medication Side Effects: none   Family Medical/ Social History: Changes? No  MENTAL HEALTH EXAM:  There were no vitals taken for this visit.There is no height or weight on file to calculate BMI.  General Appearance: Casual and Well Groomed  Eye Contact:  Fair  Speech:  Clear and Coherent, Pressured, and Talkative  Volume:  Normal  Mood:  Euphoric  Affect:  Appropriate and Congruent  Thought Process:  Goal Directed and Descriptions of Associations: Intact  Orientation:  Full (Time, Place, and Person)  Thought Content: Logical   Suicidal Thoughts:  No  Homicidal Thoughts:  No  Memory:  Recent;   Fair Remote;   Good  Judgement:  Good  Insight:  Good  Psychomotor Activity:  Normal and no tremor visible now.  Concentration:  Concentration: Good  Recall:  Good  Fund of Knowledge: Good  Language: Good  Assets:  Desire for Improvement  ADL's:  Intact  Cognition: WNL  Prognosis:  Good    DIAGNOSES:    ICD-10-CM   1. Manic behavior ([place])  F30.10     2. Generalized anxiety disorder  F41.1     3. Insomnia due to other mental disorder  F51.05    F99     4. Smoker  F17.200         Receiving Psychotherapy: No    RECOMMENDATIONS:  PDMP reviewed.  Oxycodone is known to me.  No red flags. I provided 40 minutes of face to face time during this encounter, including time spent before and after the visit in records review, medical decision making, and charting.  Smoking  cessation was discussed.  Congratulations on decreasing the amt she smokes! Continue to try to quit. Will need to stop vaping too, but will deal with that later.  She is currently manic and has been for over 2 months now.  Recommend increasing the Risperdal, may have to add or change to another antipsychotic.  For now will increase the Risperdal even though higher than typical dose, max is 16 mg. Discontinue trazodone already. Continue Wellbutrin XL 450 mg p.o. daily.   Increase Risperdal 4 mg, to 1.5 pills p.o. every morning and 1.5 p.o. nightly. Continue gabapentin 800 mg, 1 po bid. (she has been forgetting the middle of the day dose so we will leave it at twice daily. Continue Lamictal 100 mg, 1 p.o. every morning and 3 p.o. nightly. Return in 4 to  6 weeks.  Donnal Moat, PA-C

## 2021-04-03 ENCOUNTER — Ambulatory Visit: Payer: Medicare Other | Admitting: Physician Assistant

## 2021-04-03 DIAGNOSIS — Z79891 Long term (current) use of opiate analgesic: Secondary | ICD-10-CM | POA: Diagnosis not present

## 2021-04-03 DIAGNOSIS — M5136 Other intervertebral disc degeneration, lumbar region: Secondary | ICD-10-CM | POA: Diagnosis not present

## 2021-04-03 DIAGNOSIS — M4726 Other spondylosis with radiculopathy, lumbar region: Secondary | ICD-10-CM | POA: Diagnosis not present

## 2021-04-03 DIAGNOSIS — M542 Cervicalgia: Secondary | ICD-10-CM | POA: Diagnosis not present

## 2021-04-03 DIAGNOSIS — M961 Postlaminectomy syndrome, not elsewhere classified: Secondary | ICD-10-CM | POA: Diagnosis not present

## 2021-04-12 ENCOUNTER — Telehealth: Payer: Self-pay | Admitting: Physician Assistant

## 2021-04-13 ENCOUNTER — Other Ambulatory Visit: Payer: Self-pay | Admitting: Physician Assistant

## 2021-04-13 MED ORDER — QUETIAPINE FUMARATE ER 400 MG PO TB24
ORAL_TABLET | ORAL | 0 refills | Status: DC
Start: 2021-04-13 — End: 2021-05-03

## 2021-04-13 MED ORDER — QUETIAPINE FUMARATE ER 400 MG PO TB24: ORAL_TABLET | ORAL | 1 refills | Status: DC

## 2021-04-13 NOTE — Telephone Encounter (Signed)
Patient called with information.

## 2021-04-13 NOTE — Addendum Note (Signed)
Addended by: Bonney Leitz T on: 04/13/2021 02:09 PM   Modules accepted: Orders

## 2021-04-13 NOTE — Telephone Encounter (Signed)
Unfortunately some of the side effects of any of these medications include tremor and weight gain.  But just because those are listed as possible side effects it does not mean they will happen. I am going to change her to Seroquel.  She should take 1 pill every night for 4 nights and then go up to 2 pills every night. Wean off the Risperdal 4 mg by taking 1.5 pills in the morning only (stop the night dose) for 4 days and then completely stop.

## 2021-04-13 NOTE — Telephone Encounter (Signed)
Next visit is 05/01/21. Kashawn states that the Risperdal is causing her tremors and wants to know if she can change to something else? Her phone number is [phone]. Pharmacy is:  [facility] - [place], [place]  [address] [place] [address]  Phone:  [phone]  Fax:  [phone]

## 2021-04-13 NOTE — Telephone Encounter (Signed)
When did the tremor get worse?  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  Is she still manic? What are her symptoms? She's taken Zyprexa and Latuda in the past. Did either of these help with bipolar symptoms and not cause the tremor to worsen?  If we end up changing medications, I want to send it to a local pharmacy, just a 1 month supply because we will not know if it is working well or not and its not appropriate to give her a 5-month supply until we do know that.

## 2021-04-13 NOTE — Progress Notes (Signed)
Patient called and said medication would be $75 at her local pharmacy and she could not afford it. She said she could get for $4 at [facility] and have it overnighted to her. Okay to go that route per Helene Kelp, but only for 30 day supply.

## 2021-05-01 ENCOUNTER — Ambulatory Visit: Payer: Medicare Other | Admitting: Physician Assistant

## 2021-05-03 ENCOUNTER — Other Ambulatory Visit: Payer: Self-pay | Admitting: Physician Assistant

## 2021-05-07 ENCOUNTER — Other Ambulatory Visit: Payer: Self-pay | Admitting: Physician Assistant

## 2021-05-08 ENCOUNTER — Other Ambulatory Visit: Payer: Self-pay | Admitting: Physician Assistant

## 2021-05-15 ENCOUNTER — Telehealth: Payer: Self-pay | Admitting: Physician Assistant

## 2021-05-15 ENCOUNTER — Other Ambulatory Visit: Payer: Self-pay

## 2021-05-15 MED ORDER — QUETIAPINE FUMARATE ER 400 MG PO TB24: 800.0000 mg | ORAL_TABLET | Freq: Every day | ORAL | 0 refills | Status: DC

## 2021-05-15 NOTE — Telephone Encounter (Signed)
Pt wanted to know if she should continue Seroquel.The note did not say otherwise,and she stated she does see benefit from it so I went ahead and sent rx.

## 2021-05-15 NOTE — Telephone Encounter (Signed)
Next visit is 06/14/21. Marketa has been taking Seroquel XR and asked if she should continue taking it? Her phone number is [phone].

## 2021-05-16 NOTE — Telephone Encounter (Signed)
Yes, Thanks Vivien Rota.  I agree to continue.

## 2021-05-18 ENCOUNTER — Emergency Department
Admission: EM | Admit: 2021-05-18 | Discharge: 2021-05-18 | Disposition: A | Discharge status: Home / Self Care | Payer: Medicare Other | Source: Home / Self Care

## 2021-05-18 ENCOUNTER — Encounter: Payer: Self-pay | Admitting: Emergency Medicine

## 2021-05-18 ENCOUNTER — Other Ambulatory Visit: Payer: Self-pay

## 2021-05-18 ENCOUNTER — Telehealth: Payer: Self-pay | Admitting: Internal Medicine

## 2021-05-18 DIAGNOSIS — J441 Chronic obstructive pulmonary disease with (acute) exacerbation: Secondary | ICD-10-CM | POA: Diagnosis not present

## 2021-05-18 MED ORDER — BENZONATATE 200 MG PO CAPS: 200.0000 mg | ORAL_CAPSULE | Freq: Three times a day (TID) | ORAL | 0 refills | Status: AC | PRN

## 2021-05-18 MED ORDER — DOXYCYCLINE HYCLATE 100 MG PO CAPS: 100.0000 mg | ORAL_CAPSULE | Freq: Two times a day (BID) | ORAL | 0 refills | Status: DC

## 2021-05-18 MED ORDER — PREDNISONE 20 MG PO TABS: ORAL_TABLET | ORAL | 0 refills | Status: DC

## 2021-05-18 NOTE — Telephone Encounter (Signed)
Pt called and states she has sore throat, an ear ache that is on and off, and experiencing severe SOB. States that SOB occurs throughout the day and is concerning. Pt was asked if SOB is worse when laying down, and she states it occurs periodically. Also states that friend passed away in the same home  as she lives on yesterday. States it could be anxiety. Encouraged pt to speak with triage nurse, due to SOB.

## 2021-05-18 NOTE — ED Provider Notes (Signed)
[name] C[facility]    CSN: [id number] Arrival date & time: 05/18/21  1012      History   Chief Complaint Chief Complaint  Patient presents with   Shortness of Breath    HPI [name] is a 65 y.o. female.   HPI 65 year old female presents with cough, congestion [place].  Patient felt short of breath and dyspnea with movement today.  She reports having cough and mucus over the past 5 to 6 days.  [place] significant for COPD, pulmonary nodule, and aspiration pneumonia.  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   Schizoaffective disorder ([place]) 06/07/2020   Tear of gluteus medius tendon, initial encounter 03/20/2020   Gait disorder 02/17/2020   Left hip pain 12/07/2019   RLQ abdominal pain 02/09/2018   Epigastric pain 08/11/2017   Chronic RLQ pain 08/11/2017   Chest pain 08/11/2017   Smoker 08/11/2017   Eustachian tube dysfunction, bilateral 02/06/2017   Hyperglycemia 08/09/2016   Encounter for well adult exam with abnormal findings 08/09/2016   Urinary incontinence 08/09/2016   Hoarseness 08/09/2016   Left elbow pain 11/30/2015   Cellulitis of elbow 10/26/2015   Acute sinus infection 08/01/2015   COPD exacerbation ([place]) 08/01/2015   Abnormal TSH 04/01/2015   Aspiration pneumonia ([place]) 04/01/2015   Hoarseness or changing voice 03/29/2014    Dysphagia 02/06/2014   Dysuria 09/02/2013   Chronic constipation 09/02/2013   Hypotension, unspecified 05/20/2013   Recurrent falls 05/20/2013   Nausea alone 01/31/2013   Orthostatic hypotension 01/29/2013   Knee effusion, right 07/03/2012   Chronic low back pain 05/21/2012   Tremor 04/30/2012   Lower abdominal pain 07/30/2011   Low back pain 07/30/2011   Urinary incontinence, mixed 07/30/2011   Left knee pain 07/30/2011   Right shoulder pain 05/17/2011   Insomnia 03/02/2011   Brain aneurysm 02/15/2011   INSOMNIA-SLEEP DISORDER-UNSPEC 08/31/2010   ULNAR NEUROPATHY 10/31/2009   Vitamin D deficiency 09/27/2009   CHRONIC MIGRAINE W/O AURA W/INTRACTABLE W/O SM 05/02/2009   LEUKOCYTOSIS 04/05/2009   INTERMITTENT VERTIGO 02/16/2009   PERIPHERAL EDEMA 02/16/2009   COPD (chronic obstructive pulmonary disease) ([place]) 09/21/2008   PULMONARY NODULE 09/21/2008   GASTRITIS 09/19/2008   Gastroparesis 09/19/2008   Blind loop syndrome 09/01/2008   B12 deficiency 08/18/2008   BIPOLAR I D/O MOST RECENT EPIS DEPRESSED MILD 08/16/2008   FIBROMYALGIA 07/20/2008   WEIGHT LOSS 07/11/2008   HLD (hyperlipidemia) 07/20/2007   ANXIETY 07/20/2007   PERIPHERAL NEUROPATHY 07/20/2007   Allergic rhinitis 07/20/2007   GERD 07/20/2007   IBS 07/20/2007   COLONIC POLYPS, HX OF 07/20/2007   Depression 11/18/2006    Past Surgical History:  Procedure Laterality Date   ABDOMINAL HYSTERECTOMY     APPENDECTOMY  CERVICAL DISC SURGERY     cholecystectomy     coil to brain aneurysm     COLONOSCOPY     ELBOW SURGERY Bilateral    tendonitis   IR ANGIO INTRA EXTRACRAN SEL COM CAROTID INNOMINATE BILAT MOD SED  07/31/2017   IR ANGIO INTRA EXTRACRAN SEL COM CAROTID INNOMINATE BILAT MOD SED  12/30/2019   IR ANGIO VERTEBRAL SEL VERTEBRAL BILAT MOD SED  07/31/2017   IR ANGIO VERTEBRAL SEL VERTEBRAL BILAT MOD SED  12/30/2019   NECK SURGERY     OOPHORECTOMY     RADIOLOGY WITH ANESTHESIA N/A 05/10/2013    Procedure: RADIOLOGY WITH ANESTHESIA;  Surgeon: Rob Hickman, MD;  Location: [facility];  Service: Radiology;  Laterality: N/A;   SHOULDER SURGERY Left    TUBAL LIGATION     VESICOVAGINAL FISTULA CLOSURE W/ TAH      OB History   No obstetric history on file.      Home Medications    Prior to Admission medications   Medication Sig Start Date End Date Taking? Authorizing Provider  benzonatate (TESSALON) 200 MG capsule Take 1 capsule (200 mg total) by mouth 3 (three) times daily as needed for up to 7 days for cough. 05/18/21 05/25/21 Yes Eliezer Lofts, FNP  doxycycline (VIBRAMYCIN) 100 MG capsule Take 1 capsule (100 mg total) by mouth 2 (two) times daily. 05/18/21  Yes Eliezer Lofts, FNP  predniSONE (DELTASONE) 20 MG tablet Take 2 tabs PO daily x 5 days. 05/18/21  Yes Eliezer Lofts, FNP  aspirin 325 MG tablet Take 325 mg by mouth daily.    [provider]  atorvastatin (LIPITOR) 40 MG tablet TAKE 1 TABLET BY MOUTH  DAILY 03/14/21   Biagio Borg, MD  buPROPion (WELLBUTRIN XL) 150 MG 24 hr tablet TAKE 1 TABLET BY MOUTH  DAILY WITH 300 MG TO TOTAL  450 MG DAILY 05/09/21   Hurst, Teresa T, PA-C  buPROPion (WELLBUTRIN XL) 300 MG 24 hr tablet Take 1 tablet (300 mg total) by mouth daily. 02/01/21   Addison Lank, PA-C  Cholecalciferol (VITAMIN D) 50 MCG (2000 UT) tablet Take 2,000 Units by mouth daily.    [provider]  diphenhydramine-acetaminophen (TYLENOL PM) 25-500 MG TABS tablet Take 1 tablet by mouth at bedtime as needed.    [provider]  fexofenadine (ALLEGRA) 180 MG tablet Take 180 mg by mouth daily. Patient not taking: No sig reported    [provider]  gabapentin (NEURONTIN) 800 MG tablet TAKE 1 TABLET BY MOUTH 3  TIMES DAILY 05/08/21   Donnal Moat T, PA-C  lamoTRIgine (LAMICTAL) 100 MG tablet TAKE 1 TABLET BY MOUTH IN  THE MORNING AND 3 TABLETS  BY MOUTH AT BEDTIME 07/31/20   Hurst, Helene Kelp T, PA-C  naloxone [facility]) 4 MG/0.1ML LIQD nasal  spray kit Place 0.4 mg into the nose as needed (opioid overdose).     [provider]  Oxycodone HCl 20 MG TABS Take 1 tablet by mouth every 6 (six) hours as needed (pain).  07/20/16   [provider]  pantoprazole (PROTONIX) 40 MG tablet Take 40 mg by mouth 2 (two) times daily.    [provider]  potassium chloride (KLOR-CON) 10 MEQ tablet TAKE 1 TABLET BY MOUTH  DAILY 01/18/21   Biagio Borg, MD  promethazine (PHENERGAN) 12.5 MG tablet Take 1 tablet by mouth  every 6 hours as needed for nausea Patient taking differently: Take 12.5 mg by mouth every 6 (six)  hours as needed for nausea. 08/01/15   Biagio Borg, MD  QUEtiapine (SEROQUEL XR) 400 MG 24 hr tablet Take 2 tablets (800 mg total) by mouth daily. 05/15/21   Donnal Moat T, PA-C  vitamin B-12 (CYANOCOBALAMIN) 1000 MCG tablet Take 1 tablet (1,000 mcg total) by mouth daily. 08/10/20   Biagio Borg, MD    Family History Family History  Problem Relation Age of Onset   Cancer Father        lung   Cancer Mother        brain   Coronary artery disease Other        family hx   Cancer Other        family hx   Diabetes Other        DM - family hx   Hypertension Other        family hx   Seizures Other        family hx   Cancer Sister        stomach   Leukemia Sister    Colon cancer Neg Hx     Social History Social History   Tobacco Use   Smoking status: Former    Packs/day: 0.10    Years: 26.00    Pack years: 2.60    Types: Cigarettes, E-cigarettes   Smokeless tobacco: Never  Vaping Use   Vaping Use: Never used  Substance Use Topics   Alcohol use: No   Drug use: No     Allergies   Ambien [zolpidem tartrate], Hydrocodone, Lactose intolerance (gi), Nsaids, Propoxyphene n-acetaminophen, Tramadol, and Chantix [varenicline]   Review of Systems Review of Systems  HENT:  Positive for congestion and postnasal drip.   Respiratory:  Positive for cough.   All other systems reviewed and are  negative.   Physical Exam Triage Vital Signs ED Triage Vitals  Enc Vitals Group     BP 05/18/21 1032 128/78     Pulse Rate 05/18/21 1032 92     Resp 05/18/21 1032 (!) 22     Temp 05/18/21 1032 98.5 F (36.9 C)     Temp Source 05/18/21 1032 Oral     SpO2 05/18/21 1032 (!) 89 %     Weight 05/18/21 1033 120 lb (54.4 kg)     Height --      Head Circumference --      Peak Flow --      Pain Score 05/18/21 1033 0     Pain Loc --      Pain Edu? --      Excl. in [place]? --    No data found.  Updated Vital Signs BP 128/78 (BP Location: Right Arm)   Pulse 92   Temp 98.5 F (36.9 C) (Oral)   Resp (!) 22   Wt 120 lb (54.4 kg)   SpO2 (!) 89% Comment: provider aware  BMI 19.97 kg/m    Physical Exam Vitals and nursing note reviewed.  Constitutional:      Appearance: Normal appearance. She is normal weight.  HENT:     Head: Normocephalic and atraumatic.     Right Ear: Tympanic membrane, ear canal and external ear normal.     Left Ear: Tympanic membrane, ear canal and external ear normal.     Mouth/Throat:     Mouth: Mucous membranes are moist.     Pharynx: Oropharynx is clear.  Eyes:     Extraocular Movements: Extraocular movements intact.     Conjunctiva/sclera:  Conjunctivae normal.     Pupils: Pupils are equal, round, and reactive to light.  Cardiovascular:     Rate and Rhythm: Normal rate and regular rhythm.     Pulses: Normal pulses.     Heart sounds: Normal heart sounds.  Pulmonary:     Effort: Pulmonary effort is normal.     Breath sounds: Normal breath sounds.  Musculoskeletal:        General: Normal range of motion.     Cervical back: Normal range of motion and neck supple.  Skin:    General: Skin is warm and dry.  Neurological:     General: No focal deficit present.     Mental Status: She is alert and oriented to person, place, and time.     UC Treatments / Results  Labs (all labs ordered are listed, but only abnormal results are displayed) Labs Reviewed -  No data to display  EKG   Radiology No results found.  Procedures Procedures (including critical care time)  Medications Ordered in UC Medications - No data to display  Initial Impression / Assessment and Plan / UC Course  I have reviewed the triage vital signs and the nursing notes.  Pertinent labs & imaging results that were available during my care of the patient were reviewed by me and considered in my medical decision making (see chart for details).     MDM: 1.  COPD exacerbation-SPO2 was 88 as she presented to clinic after 30 minutes of 2 L of oxygen via nasal cannula SPO2 94 prior to discharge, Rx Doxycycline, Prednisone burst and Tessalon Perles. Advised patient to take medication as directed with food to completion.  Encouraged patient to increase daily water intake while taking these medications.  Patient discharged home, hemodynamically stable. Final Clinical Impressions(s) / UC Diagnoses   Final diagnoses:  COPD exacerbation [facility])     Discharge Instructions      Advised patient to take medication as directed with food to completion.  Encouraged patient to increase daily water intake while taking these medications.     ED Prescriptions     Medication Sig Dispense Auth. Provider   doxycycline (VIBRAMYCIN) 100 MG capsule Take 1 capsule (100 mg total) by mouth 2 (two) times daily. 20 capsule Eliezer Lofts, FNP   predniSONE (DELTASONE) 20 MG tablet Take 2 tabs PO daily x 5 days. 10 tablet Eliezer Lofts, FNP   benzonatate (TESSALON) 200 MG capsule Take 1 capsule (200 mg total) by mouth 3 (three) times daily as needed for up to 7 days for cough. 40 capsule Eliezer Lofts, FNP      PDMP not reviewed this encounter.   Eliezer Lofts, FNP 05/18/21 1226

## 2021-05-18 NOTE — Discharge Instructions (Addendum)
Advised patient to take medication as directed with food to completion.  Encouraged patient to increase daily water intake while taking these medications.

## 2021-05-18 NOTE — Telephone Encounter (Signed)
Connected with [facility].    Caller states she has a sore throat, ear ache. Shortness of breath. Labored breathing. She was with her friend last night when they passed away. States she has a sore throat with a earache. She is coughing. taking dayquil and nyquil. Symptoms started on Monday. Woke up yesterday soaking wet    Advised to go to ED. Pt complied

## 2021-05-18 NOTE — ED Triage Notes (Signed)
Pt states she had cold symptoms after traveling from [place] mountains on [place]. This am felt more SOB and dyspnea with movement. She is having a cough with mucous. States she has been told she might have COPD in past.

## 2021-05-18 NOTE — ED Triage Notes (Signed)
Pt placed on 2L O2 per provider. She is stable and sats are 92-94 currently.

## 2021-05-19 ENCOUNTER — Telehealth: Payer: Self-pay

## 2021-05-19 MED ORDER — CEFDINIR 300 MG PO CAPS: 300.0000 mg | ORAL_CAPSULE | Freq: Two times a day (BID) | ORAL | 0 refills | Status: AC

## 2021-05-19 NOTE — Telephone Encounter (Signed)
Pt's daughter calls to report that Doxycycline prescribed by M. Ragan, FNP at pt's visit yesterday is causing her to "projectile vomit" soon after taking. This happened last night and this morning, even though taken w/ food. Discussed w/ M. Enis Gash, [place]. New orders for Cefdinir given and submitted to [facility]. Pt notified.

## 2021-05-21 DIAGNOSIS — J42 Unspecified chronic bronchitis: Secondary | ICD-10-CM | POA: Diagnosis not present

## 2021-05-21 DIAGNOSIS — J189 Pneumonia, unspecified organism: Secondary | ICD-10-CM | POA: Insufficient documentation

## 2021-05-21 DIAGNOSIS — I080 Rheumatic disorders of both mitral and aortic valves: Secondary | ICD-10-CM | POA: Diagnosis not present

## 2021-05-21 DIAGNOSIS — J1282 Pneumonia due to coronavirus disease 2019: Secondary | ICD-10-CM | POA: Diagnosis not present

## 2021-05-21 DIAGNOSIS — J329 Chronic sinusitis, unspecified: Secondary | ICD-10-CM | POA: Diagnosis not present

## 2021-05-21 DIAGNOSIS — T50904A Poisoning by unspecified drugs, medicaments and biological substances, undetermined, initial encounter: Secondary | ICD-10-CM | POA: Diagnosis not present

## 2021-05-21 DIAGNOSIS — G894 Chronic pain syndrome: Secondary | ICD-10-CM | POA: Diagnosis not present

## 2021-05-21 DIAGNOSIS — A419 Sepsis, unspecified organism: Secondary | ICD-10-CM | POA: Diagnosis not present

## 2021-05-21 DIAGNOSIS — I499 Cardiac arrhythmia, unspecified: Secondary | ICD-10-CM | POA: Diagnosis not present

## 2021-05-21 DIAGNOSIS — F17200 Nicotine dependence, unspecified, uncomplicated: Secondary | ICD-10-CM | POA: Diagnosis not present

## 2021-05-21 DIAGNOSIS — J9601 Acute respiratory failure with hypoxia: Secondary | ICD-10-CM | POA: Diagnosis not present

## 2021-05-21 DIAGNOSIS — B955 Unspecified streptococcus as the cause of diseases classified elsewhere: Secondary | ICD-10-CM | POA: Diagnosis not present

## 2021-05-21 DIAGNOSIS — Z743 Need for continuous supervision: Secondary | ICD-10-CM | POA: Diagnosis not present

## 2021-05-21 DIAGNOSIS — K449 Diaphragmatic hernia without obstruction or gangrene: Secondary | ICD-10-CM | POA: Diagnosis not present

## 2021-05-21 DIAGNOSIS — J018 Other acute sinusitis: Secondary | ICD-10-CM | POA: Diagnosis not present

## 2021-05-21 DIAGNOSIS — K3189 Other diseases of stomach and duodenum: Secondary | ICD-10-CM | POA: Diagnosis not present

## 2021-05-21 DIAGNOSIS — J441 Chronic obstructive pulmonary disease with (acute) exacerbation: Secondary | ICD-10-CM | POA: Diagnosis not present

## 2021-05-21 DIAGNOSIS — J811 Chronic pulmonary edema: Secondary | ICD-10-CM | POA: Diagnosis not present

## 2021-05-21 DIAGNOSIS — R799 Abnormal finding of blood chemistry, unspecified: Secondary | ICD-10-CM | POA: Diagnosis not present

## 2021-05-21 DIAGNOSIS — R7881 Bacteremia: Secondary | ICD-10-CM | POA: Diagnosis not present

## 2021-05-21 DIAGNOSIS — Z720 Tobacco use: Secondary | ICD-10-CM | POA: Diagnosis not present

## 2021-05-21 DIAGNOSIS — K7689 Other specified diseases of liver: Secondary | ICD-10-CM | POA: Diagnosis not present

## 2021-05-21 DIAGNOSIS — K219 Gastro-esophageal reflux disease without esophagitis: Secondary | ICD-10-CM | POA: Diagnosis not present

## 2021-05-21 DIAGNOSIS — R6889 Other general symptoms and signs: Secondary | ICD-10-CM | POA: Diagnosis not present

## 2021-05-21 DIAGNOSIS — R404 Transient alteration of awareness: Secondary | ICD-10-CM | POA: Diagnosis not present

## 2021-05-21 DIAGNOSIS — J984 Other disorders of lung: Secondary | ICD-10-CM | POA: Diagnosis not present

## 2021-05-21 DIAGNOSIS — U071 COVID-19: Secondary | ICD-10-CM | POA: Diagnosis not present

## 2021-05-21 DIAGNOSIS — B954 Other streptococcus as the cause of diseases classified elsewhere: Secondary | ICD-10-CM | POA: Diagnosis not present

## 2021-05-21 DIAGNOSIS — J449 Chronic obstructive pulmonary disease, unspecified: Secondary | ICD-10-CM | POA: Diagnosis not present

## 2021-05-21 DIAGNOSIS — J439 Emphysema, unspecified: Secondary | ICD-10-CM | POA: Diagnosis not present

## 2021-05-21 DIAGNOSIS — I083 Combined rheumatic disorders of mitral, aortic and tricuspid valves: Secondary | ICD-10-CM | POA: Diagnosis not present

## 2021-05-21 DIAGNOSIS — J438 Other emphysema: Secondary | ICD-10-CM | POA: Diagnosis not present

## 2021-05-21 DIAGNOSIS — J90 Pleural effusion, not elsewhere classified: Secondary | ICD-10-CM | POA: Diagnosis not present

## 2021-05-21 DIAGNOSIS — J328 Other chronic sinusitis: Secondary | ICD-10-CM | POA: Diagnosis not present

## 2021-05-30 ENCOUNTER — Other Ambulatory Visit: Payer: Self-pay | Admitting: Physician Assistant

## 2021-06-01 ENCOUNTER — Telehealth: Payer: Self-pay | Admitting: Physician Assistant

## 2021-06-01 ENCOUNTER — Other Ambulatory Visit: Payer: Self-pay

## 2021-06-01 MED ORDER — QUETIAPINE FUMARATE ER 400 MG PO TB24
800.0000 mg | ORAL_TABLET | Freq: Every day | ORAL | 0 refills | Status: DC
Start: 2021-06-01 — End: 2021-06-25

## 2021-06-01 NOTE — Telephone Encounter (Signed)
Rx sent 

## 2021-06-01 NOTE — Telephone Encounter (Signed)
Pt called requesting Rx for Seroquel. Stated just released from hospital in for 11 days. Need Seroquel for sleep. Was taken while in hospital. Rx 05/15/21 was sent to [facility].  She never received. Send to [facility]. Contact # [phone]

## 2021-06-08 ENCOUNTER — Ambulatory Visit (INDEPENDENT_AMBULATORY_CARE_PROVIDER_SITE_OTHER): Payer: Medicare Other | Admitting: Internal Medicine

## 2021-06-08 ENCOUNTER — Other Ambulatory Visit: Payer: Self-pay

## 2021-06-08 ENCOUNTER — Encounter: Payer: Self-pay | Admitting: Internal Medicine

## 2021-06-08 VITALS — BP 126/70 | HR 78 | Temp 97.8°F | Ht 65.0 in | Wt 130.0 lb

## 2021-06-08 DIAGNOSIS — U071 COVID-19: Secondary | ICD-10-CM

## 2021-06-08 DIAGNOSIS — J1282 Pneumonia due to coronavirus disease 2019: Secondary | ICD-10-CM

## 2021-06-08 DIAGNOSIS — J438 Other emphysema: Secondary | ICD-10-CM

## 2021-06-08 DIAGNOSIS — B955 Unspecified streptococcus as the cause of diseases classified elsewhere: Secondary | ICD-10-CM | POA: Diagnosis not present

## 2021-06-08 DIAGNOSIS — R7881 Bacteremia: Secondary | ICD-10-CM | POA: Diagnosis not present

## 2021-06-08 DIAGNOSIS — F3289 Other specified depressive episodes: Secondary | ICD-10-CM | POA: Diagnosis not present

## 2021-06-08 MED ORDER — PROMETHAZINE HCL 12.5 MG PO TABS: ORAL_TABLET | ORAL | 0 refills | Status: AC

## 2021-06-08 NOTE — Progress Notes (Signed)
Patient ID: [name], female   DOB: 08/18/1955, 65 y.o.   MRN: [id number]        Chief Complaint: follow up recent [facility] admit dec 5 with AMS, covid pna and acute hypoxic resp failure, streptococcal bacteremia       HPI:  [name] is a 65 y.o. female here after recent episode above requiring Bipap only, improved with IV antibx and she was started on remdesivir and IV steroids for COPD exacerbation. She was also started on Rocephin and azithromycin for her pneumonia. Unfortunate blood cultures came back positive for staph epidermidis and strep para sanguinius. Infectious disease was consulted and they think that the staph may be contaminant but the strep is a true pathogen. Final antibiotic recommendation is Zyvox x 5 days. She had a 2D echo with no vegetations. TEE was also without vegetations. Oxygen was able to be weaned to room air prior to discharge. At this point she stable for discharge and will follow up with ID as an outpatient. - Currently Pt denies chest pain, increased sob or doe, wheezing, orthopnea, PND, increased LE swelling, palpitations, dizziness or syncope.   Pt denies fever, wt loss, night sweats, loss of appetite, or other constitutional symptoms   Pt denies polydipsia, polyuria, or new focal neuro s/s.  Has had mild worsening depressive symptoms after friends death - denies SI or HI Wt Readings from Last 3 Encounters:  06/08/21 130 lb (59 kg)  05/18/21 120 lb (54.4 kg)  02/09/21 123 lb 12.8 oz (56.2 kg)   BP Readings from Last 3 Encounters:  06/08/21 126/70  05/18/21 128/78  02/09/21 118/80         Past Medical History:  Diagnosis Date   Allergic rhinitis    Arthritis    Brain aneurysm    Chronic abdominal pain    COPD (chronic obstructive pulmonary disease) (HCC)    denies   Depression    bipolar   Fibromyalgia    Gastroparesis    GERD (gastroesophageal reflux disease)    H/O hiatal hernia    History of colonic polyps    HLD (hyperlipidemia)    IBS  (irritable bowel syndrome)    chronic constipation   Migraines    OCD (obsessive compulsive disorder)    anxiety   Peripheral neuropathy    Past Surgical History:  Procedure Laterality Date   ABDOMINAL HYSTERECTOMY     APPENDECTOMY     CERVICAL DISC SURGERY     cholecystectomy     coil to brain aneurysm     COLONOSCOPY     ELBOW SURGERY Bilateral    tendonitis   IR ANGIO INTRA EXTRACRAN SEL COM CAROTID INNOMINATE BILAT MOD SED  07/31/2017   IR ANGIO INTRA EXTRACRAN SEL COM CAROTID INNOMINATE BILAT MOD SED  12/30/2019   IR ANGIO VERTEBRAL SEL VERTEBRAL BILAT MOD SED  07/31/2017   IR ANGIO VERTEBRAL SEL VERTEBRAL BILAT MOD SED  12/30/2019   NECK SURGERY     OOPHORECTOMY     RADIOLOGY WITH ANESTHESIA N/A 05/10/2013   Procedure: RADIOLOGY WITH ANESTHESIA;  Surgeon: Rob Hickman, MD;  Location: [place];  Service: Radiology;  Laterality: N/A;   SHOULDER SURGERY Left    TUBAL LIGATION     VESICOVAGINAL FISTULA CLOSURE W/ TAH      reports that she has quit smoking. Her smoking use included cigarettes and e-cigarettes. She has a 2.60 pack-year smoking history. She has never used smokeless tobacco. She reports that she  does not drink alcohol and does not use drugs. family history includes Cancer in her father, mother, sister, and another family member; Coronary artery disease in an other family member; Diabetes in an other family member; Hypertension in an other family member; Leukemia in her sister; Seizures in an other family member. Allergies  Allergen Reactions   Ambien [Zolpidem Tartrate] Other (See Comments)    Memory issues   Hydrocodone Nausea Only    Can take it if its in a cough syrup    Lactose Intolerance (Gi)     Bloating, upset stomach    Nsaids     GI irritation, pt tolerates meloxicam and diclofenac    Propoxyphene N-Acetaminophen Itching   Tramadol Nausea And Vomiting   Chantix [Varenicline] Other (See Comments)    "Makes bipolar worse" agitation, depression, sick  to stomach   Current Outpatient Medications on File Prior to Visit  Medication Sig Dispense Refill   aspirin 325 MG tablet Take 325 mg by mouth daily.     atorvastatin (LIPITOR) 40 MG tablet TAKE 1 TABLET BY MOUTH  DAILY 90 tablet 3   buPROPion (WELLBUTRIN XL) 150 MG 24 hr tablet TAKE 1 TABLET BY MOUTH  DAILY WITH 300 MG TO TOTAL  450 MG DAILY 90 tablet 0   buPROPion (WELLBUTRIN XL) 300 MG 24 hr tablet TAKE 1 TABLET BY MOUTH  DAILY 90 tablet 0   Cholecalciferol (VITAMIN D) 50 MCG (2000 UT) tablet Take 2,000 Units by mouth daily.     diphenhydramine-acetaminophen (TYLENOL PM) 25-500 MG TABS tablet Take 1 tablet by mouth at bedtime as needed.     gabapentin (NEURONTIN) 800 MG tablet TAKE 1 TABLET BY MOUTH 3  TIMES DAILY 90 tablet 0   lamoTRIgine (LAMICTAL) 100 MG tablet TAKE 1 TABLET BY MOUTH IN  THE MORNING AND 3 TABLETS  BY MOUTH AT BEDTIME 360 tablet 3   naloxone (NARCAN) 4 MG/0.1ML LIQD nasal spray kit Place 0.4 mg into the nose as needed (opioid overdose).      Oxycodone HCl 20 MG TABS Take 1 tablet by mouth every 6 (six) hours as needed (pain).      pantoprazole (PROTONIX) 40 MG tablet Take 40 mg by mouth 2 (two) times daily.     potassium chloride (KLOR-CON) 10 MEQ tablet TAKE 1 TABLET BY MOUTH  DAILY 90 tablet 3   QUEtiapine (SEROQUEL XR) 400 MG 24 hr tablet Take 2 tablets (800 mg total) by mouth daily. 60 tablet 0   vitamin B-12 (CYANOCOBALAMIN) 1000 MCG tablet Take 1 tablet (1,000 mcg total) by mouth daily. 90 tablet 3   fexofenadine (ALLEGRA) 180 MG tablet Take 180 mg by mouth daily. (Patient not taking: Reported on 02/01/2021)     No current facility-administered medications on file prior to visit.        ROS:  All others reviewed and negative.  Objective        PE:  BP 126/70    Pulse 78    Temp 97.8 F (36.6 C) (Oral)    Ht _0  (1.651 m)    Wt 130 lb (59 kg)    SpO2 100%    BMI 21.63 kg/m                 Constitutional: Pt appears in NAD               HENT: Head: NCAT.  Right Ear: External ear normal.                 Left Ear: External ear normal.                Eyes: . Pupils are equal, round, and reactive to light. Conjunctivae and EOM are normal               Nose: without d/c or deformity               Neck: Neck supple. Gross normal ROM               Cardiovascular: Normal rate and regular rhythm.                 Pulmonary/Chest: Effort normal and breath sounds without rales or wheezing.                Abd:  Soft, NT, ND, + BS, no organomegaly               Neurological: Pt is alert. At baseline orientation, motor grossly intact               Skin: Skin is warm. No rashes, no other new lesions, LE edema - none               Psychiatric: Pt behavior is normal without agitation , flat affect  Micro: none  Cardiac tracings I have personally interpreted today:  none  Pertinent Radiological findings (summarize): none   Lab Results  Component Value Date   WBC 6.7 02/09/2021   HGB 13.9 02/09/2021   HCT 41.9 02/09/2021   PLT 288.0 02/09/2021   GLUCOSE 82 02/09/2021   CHOL 162 08/08/2020   TRIG 72.0 08/08/2020   HDL 54.00 08/08/2020   LDLDIRECT 102.7 08/21/2011   LDLCALC 94 08/08/2020   ALT 8 02/09/2021   AST 10 02/09/2021   NA 132 (L) 02/09/2021   K 3.9 02/09/2021   CL 96 02/09/2021   CREATININE 0.96 02/09/2021   BUN 9 02/09/2021   CO2 29 02/09/2021   TSH 1.21 02/09/2021   INR 0.9 12/30/2019   HGBA1C 5.6 08/08/2020   Assessment/Plan:  [name] is a 65 y.o. White or Caucasian [1] female with  has a past medical history of Allergic rhinitis, Arthritis, Brain aneurysm, Chronic abdominal pain, COPD (chronic obstructive pulmonary disease) ([place]), Depression, Fibromyalgia, Gastroparesis, GERD (gastroesophageal reflux disease), H/O hiatal hernia, History of colonic polyps, HLD (hyperlipidemia), IBS (irritable bowel syndrome), Migraines, OCD (obsessive compulsive disorder), and Peripheral neuropathy.  Pneumonia due to COVID-19  virus Resolved, s/p vax x 2 and 1 booster, continue to monitor  COPD (chronic obstructive pulmonary disease) Finished oral prednisone, o/w stable, cont albuterol inhaler   Streptococcal bacteremia Resolved s/p zyvox,  to f/u any worsening symptoms or concerns, f/u ID as planned  Depression Mild situationally worse recently after a friends death, pt to f/u psychiatry, no SI or HI  Followup: Return in about 6 months (around 12/07/2021).  Cathlean Cower, MD 06/11/2021 8:33 AM [facility]

## 2021-06-08 NOTE — Patient Instructions (Signed)
Please continue all other medications as before, and refills have been done if requested.  Please have the pharmacy call with any other refills you may need.  Please continue your efforts at being more active, low cholesterol diet  Please keep your appointments with your specialists as you may have planned  Please make an Appointment to return in 6 months, or sooner if needed

## 2021-06-11 ENCOUNTER — Encounter: Payer: Self-pay | Admitting: Internal Medicine

## 2021-06-11 DIAGNOSIS — J1282 Pneumonia due to coronavirus disease 2019: Secondary | ICD-10-CM | POA: Insufficient documentation

## 2021-06-11 DIAGNOSIS — R7881 Bacteremia: Secondary | ICD-10-CM | POA: Insufficient documentation

## 2021-06-11 NOTE — Assessment & Plan Note (Signed)
Finished oral prednisone, o/w stable, cont albuterol inhaler

## 2021-06-11 NOTE — Assessment & Plan Note (Signed)
Resolved, s/p vax x 2 and 1 booster, continue to monitor

## 2021-06-11 NOTE — Assessment & Plan Note (Signed)
Mild situationally worse recently after a friends death, pt to f/u psychiatry, no SI or HI

## 2021-06-11 NOTE — Assessment & Plan Note (Signed)
Resolved s/p zyvox,  to f/u any worsening symptoms or concerns, f/u ID as planned

## 2021-06-14 ENCOUNTER — Ambulatory Visit: Payer: Medicare Other | Admitting: Physician Assistant

## 2021-06-20 ENCOUNTER — Other Ambulatory Visit: Payer: Self-pay | Admitting: Physician Assistant

## 2021-06-21 NOTE — Telephone Encounter (Signed)
Received a refill request for her gabapentin. Rx is written for TID, but your last note states she sometimes forgets a dose and will leave it as BID. She has an appt with you this month. I didn't know if I should change the Rx to reflect BID or not.

## 2021-06-22 ENCOUNTER — Other Ambulatory Visit: Payer: Self-pay | Admitting: Physician Assistant

## 2021-06-25 ENCOUNTER — Other Ambulatory Visit: Payer: Self-pay | Admitting: Physician Assistant

## 2021-06-26 DIAGNOSIS — Z79891 Long term (current) use of opiate analgesic: Secondary | ICD-10-CM | POA: Diagnosis not present

## 2021-06-26 DIAGNOSIS — M4726 Other spondylosis with radiculopathy, lumbar region: Secondary | ICD-10-CM | POA: Diagnosis not present

## 2021-06-26 DIAGNOSIS — M5136 Other intervertebral disc degeneration, lumbar region: Secondary | ICD-10-CM | POA: Diagnosis not present

## 2021-06-26 DIAGNOSIS — M961 Postlaminectomy syndrome, not elsewhere classified: Secondary | ICD-10-CM | POA: Diagnosis not present

## 2021-06-26 DIAGNOSIS — M542 Cervicalgia: Secondary | ICD-10-CM | POA: Diagnosis not present

## 2021-06-30 DIAGNOSIS — K5900 Constipation, unspecified: Secondary | ICD-10-CM | POA: Diagnosis not present

## 2021-06-30 DIAGNOSIS — M545 Low back pain, unspecified: Secondary | ICD-10-CM | POA: Diagnosis not present

## 2021-06-30 DIAGNOSIS — M25551 Pain in right hip: Secondary | ICD-10-CM | POA: Diagnosis not present

## 2021-07-04 ENCOUNTER — Ambulatory Visit: Payer: Medicare Other | Admitting: Physician Assistant

## 2021-07-05 ENCOUNTER — Other Ambulatory Visit: Payer: Self-pay

## 2021-07-05 ENCOUNTER — Ambulatory Visit: Payer: Medicare Other | Admitting: Family Medicine

## 2021-07-05 VITALS — BP 114/76 | HR 92 | Ht 65.0 in | Wt 133.2 lb

## 2021-07-05 DIAGNOSIS — Z7409 Other reduced mobility: Secondary | ICD-10-CM

## 2021-07-05 DIAGNOSIS — M25551 Pain in right hip: Secondary | ICD-10-CM | POA: Diagnosis not present

## 2021-07-05 NOTE — Progress Notes (Signed)
I, [name], LAT, ATC acting as a scribe for Lori Leader, MD.  [name] is a 66 y.o. female who presents to [place] at [facility] today for R hip pain. Pt was previously seen by Dr. [name] on 5/3/2 78for chronic R shoulder pain. Today, pt c/o R hip pain ongoing since early Jan, that she injured in a fall backwards. Pt was seen for this complaint on 06/30/21 at [facility] and was given an IM 60mg  Toradol injection. Pt locates pain to the posterior aspect of the R hip. Pt c/o increased stiffness after sitting.  Low back pain: yes- R side Radiates: no LE Numbness/tingling: yes LE Weakness: yes Aggravates: transitioning to stand, laying, sitting Treatments tried: oxycodone, IBU, naproxen, heat  Dx imaging: 06/30/21 R hip & L-spine XR (done [facility])  Pertinent review of systems: No fevers or chills  Relevant historical information: History of left hip pain ultimately due to gluteus medius tendon tear evaluated via MRI.   Exam:  BP 114/76    Pulse 92    Ht 5\' 5"  (1.651 m)    Wt 133 lb 3.2 oz (60.4 kg)    SpO2 97%    BMI 22.17 kg/m  General: Well Developed, well nourished, and in no acute distress.   MSK: Right hip normal-appearing Tender palpation lateral hip at iliac crest. Normal hip motion. Hip abduction and external rotation strength are diminished 4/5. Antalgic gait present.    Lab and Radiology Results  Hip lateral iliac crest: Right Consent obtained and timeout performed. Area of maximum tenderness palpated and identified. Skin cleaned with alcohol, cold spray applied. A 22 needle was used to access the lateral iliac crest 40mg  of Kenalog and 2 mL of Marcaine were used to inject the lateral iliac crest Patient tolerated the procedure well.   Impression   1.  No acute fracture or malalignment.  2.  Mild degenerative changes of the bilateral hips, bilateral SI joints, and pubic symphysis.  3.  Incompletely imaged degenerative  disc disease of the lumbar spine.  4.  Single surgical clip projects over the central pelvis. Narrative  X-RAY RIGHT HIP (2+ VIEWS), 06/30/2021 2:29 PM   INDICATION:  \ M25.551 Pain in right hip  COMPARISON: None. Procedure Note  [name], [name], MD - 06/30/2021  Formatting of this note might be different from the original.  Ragan (2+ VIEWS), 06/30/2021 2:29 PM   INDICATION:  \ M25.551 Pain in right hip  COMPARISON: None.   CONCLUSION:   1.  No acute fracture or malalignment.  2.  Mild degenerative changes of the bilateral hips, bilateral SI joints, and pubic symphysis.  3.  Incompletely imaged degenerative disc disease of the lumbar spine.  4.  Single surgical clip projects over the central pelvis. Exam End: 06/30/21 14:29   Specimen Collected: 06/30/21 14:38 Last Resulted: 06/30/21 15:12  Received From: [facility]  Result Received: 07/05/21 07:40   Impression   1.  Age-indeterminate compression fracture of the T11 vertebral body with approximately 25% height loss. This finding appears to be present on a chest radiograph from 11/06/2018 and is favored chronic. Correlation with any recent outside imaging is recommended. MRI of the thoracolumbar spine could also assess for signs of acuity, such as marrow edema.  2.  Slight levocurvature of the lumbar spine.  3.  Mild to moderate multilevel degenerative disc disease and facet hypertrophy, most pronounced at L4-L5 and L5-S1.  4.  Vascular calcifications.  5.  Right upper quadrant surgical clips.  6.  Osteopenia. Narrative  X-RAY LUMBOSACRAL SPINE (2-3 VIEWS), 06/30/2021 2:29 PM   INDICATION: fell onto hip and back \ M25.551 Pain in right hip  COMPARISON: Chest radiograph 11/05/2008 Procedure Note  [name], [name], MD - 06/30/2021  Formatting of this note might be different from the original.  [name] (2-3 VIEWS), 06/30/2021 2:29 PM   INDICATION: fell onto hip and back \  M25.551 Pain in right hip  COMPARISON: Chest radiograph 11/05/2008   CONCLUSION:   1.  Age-indeterminate compression fracture of the T11 vertebral body with approximately 25% height loss. This finding appears to be present on a chest radiograph from 11/06/2018 and is favored chronic. Correlation with any recent outside imaging is recommended. MRI of the thoracolumbar spine could also assess for signs of acuity, such as marrow edema.  2.  Slight levocurvature of the lumbar spine.  3.  Mild to moderate multilevel degenerative disc disease and facet hypertrophy, most pronounced at L4-L5 and L5-S1.  4.  Vascular calcifications.  5.  Right upper quadrant surgical clips.  6.  Osteopenia. Exam End: 06/30/21 14:29   Specimen Collected: 06/30/21 14:33      Assessment and Plan: 66 y.o. female with right lateral hip pain after fall.  X-rays obtained in urgent care at [facility] were negative.  Pain thought to be due to tendinitis or tendon injury at the iliac crest origin of the hip abductors. Plan for injection today for pain control and home health physical therapy due to her difficult transportation..  Recheck back in 1 month.   PDMP not reviewed this encounter. Orders Placed This Encounter  Procedures   Ambulatory referral to Home Health    Referral Priority:   Routine    Referral Type:   Home Health Care    Referral Reason:   Specialty Services Required    Requested Specialty:   [place]    Number of Visits Requested:   1   No orders of the defined types were placed in this encounter.    Discussed warning signs or symptoms. Please see discharge instructions. Patient expresses understanding.   The above documentation has been reviewed and is accurate and complete [name], M.D.

## 2021-07-05 NOTE — Patient Instructions (Addendum)
Thank you for coming in today.   I've referred you to Physical Therapy.  Let us know if you don't hear from them in one week.   Call or go to the ER if you develop a large red swollen joint with extreme pain or oozing puss.    Return in 1 month.

## 2021-07-09 ENCOUNTER — Telehealth: Payer: Self-pay | Admitting: Family Medicine

## 2021-07-09 NOTE — Telephone Encounter (Signed)
Patient called stating that she has not had any improvement since her visit with Dr [name].  She asked if there is anything else she can try? Please advise.

## 2021-07-10 NOTE — Telephone Encounter (Signed)
Spoke to patient. She has not heard from them yet. I reached out to [place] with [facility] to follow up on the status of her referral (waiting on response).

## 2021-07-10 NOTE — Telephone Encounter (Signed)
I spoke to [facility]. They are currently having staffing issues. Their [place] PT person had a family emergency and is out of the country for 3 weeks.  She is working on [employer] scheduled as soon as possible.  Alora asked if there was anything else she could do to help the pain?

## 2021-07-10 NOTE — Telephone Encounter (Signed)
Have you heard from home health PT yet?

## 2021-07-11 NOTE — Telephone Encounter (Signed)
This is going to be challenging.  I do believe the best solution for your pain is physical therapy as I do not think medications are going to work all that well.  You are already on max doses of opiates and gabapentin.  The injection did not work as well as we would like.  I think working on the home exercises that you have been taught previously for this is a good start while awaiting physical therapy.

## 2021-07-11 NOTE — Telephone Encounter (Signed)
Angie was able to get [facility] to be able to visit the patient soon (unsure of date scheduled).

## 2021-07-14 DIAGNOSIS — S22080A Wedge compression fracture of T11-T12 vertebra, initial encounter for closed fracture: Secondary | ICD-10-CM | POA: Diagnosis not present

## 2021-07-16 ENCOUNTER — Ambulatory Visit (INDEPENDENT_AMBULATORY_CARE_PROVIDER_SITE_OTHER): Payer: Medicare Other | Admitting: Internal Medicine

## 2021-07-16 ENCOUNTER — Other Ambulatory Visit: Payer: Self-pay

## 2021-07-16 ENCOUNTER — Encounter: Payer: Self-pay | Admitting: Internal Medicine

## 2021-07-16 VITALS — BP 100/70 | HR 87 | Temp 98.2°F | Ht 65.0 in | Wt 125.0 lb

## 2021-07-16 DIAGNOSIS — E785 Hyperlipidemia, unspecified: Secondary | ICD-10-CM | POA: Diagnosis not present

## 2021-07-16 DIAGNOSIS — R1013 Epigastric pain: Secondary | ICD-10-CM | POA: Diagnosis not present

## 2021-07-16 DIAGNOSIS — J449 Chronic obstructive pulmonary disease, unspecified: Secondary | ICD-10-CM | POA: Diagnosis not present

## 2021-07-16 DIAGNOSIS — F17211 Nicotine dependence, cigarettes, in remission: Secondary | ICD-10-CM | POA: Diagnosis not present

## 2021-07-16 DIAGNOSIS — J9601 Acute respiratory failure with hypoxia: Secondary | ICD-10-CM | POA: Diagnosis not present

## 2021-07-16 DIAGNOSIS — Z0001 Encounter for general adult medical examination with abnormal findings: Secondary | ICD-10-CM

## 2021-07-16 DIAGNOSIS — E538 Deficiency of other specified B group vitamins: Secondary | ICD-10-CM

## 2021-07-16 DIAGNOSIS — E559 Vitamin D deficiency, unspecified: Secondary | ICD-10-CM

## 2021-07-16 DIAGNOSIS — Z122 Encounter for screening for malignant neoplasm of respiratory organs: Secondary | ICD-10-CM | POA: Diagnosis not present

## 2021-07-16 DIAGNOSIS — R739 Hyperglycemia, unspecified: Secondary | ICD-10-CM | POA: Diagnosis not present

## 2021-07-16 DIAGNOSIS — J9602 Acute respiratory failure with hypercapnia: Secondary | ICD-10-CM | POA: Diagnosis not present

## 2021-07-16 LAB — TSH: TSH: 2.07 u[IU]/mL (ref 0.35–5.50)

## 2021-07-16 LAB — CBC WITH DIFFERENTIAL/PLATELET
Basophils Absolute: 0.1 10*3/uL (ref 0.0–0.1)
Basophils Relative: 0.7 % (ref 0.0–3.0)
Eosinophils Absolute: 0.1 10*3/uL (ref 0.0–0.7)
Eosinophils Relative: 1.6 % (ref 0.0–5.0)
HCT: 42 % (ref 36.0–46.0)
Hemoglobin: 13.8 g/dL (ref 12.0–15.0)
Lymphocytes Relative: 17.7 % (ref 12.0–46.0)
Lymphs Abs: 1.3 10*3/uL (ref 0.7–4.0)
MCHC: 32.8 g/dL (ref 30.0–36.0)
MCV: 99.1 fl (ref 78.0–100.0)
Monocytes Absolute: 0.7 10*3/uL (ref 0.1–1.0)
Monocytes Relative: 8.8 % (ref 3.0–12.0)
Neutro Abs: 5.4 10*3/uL (ref 1.4–7.7)
Neutrophils Relative %: 71.2 % (ref 43.0–77.0)
Platelets: 346 10*3/uL (ref 150.0–400.0)
RBC: 4.24 Mil/uL (ref 3.87–5.11)
RDW: 14.4 % (ref 11.5–15.5)
WBC: 7.5 10*3/uL (ref 4.0–10.5)

## 2021-07-16 LAB — HEMOGLOBIN A1C: Hgb A1c MFr Bld: 4.8 % (ref 4.6–6.5)

## 2021-07-16 LAB — VITAMIN B12: Vitamin B-12: >1504 pg/mL — ABNORMAL HIGH (ref 211–911)

## 2021-07-16 LAB — VITAMIN D 25 HYDROXY (VIT D DEFICIENCY, FRACTURES): VITD: 44.86 ng/mL (ref 30.00–100.00)

## 2021-07-16 MED ORDER — SUCRALFATE 1 G PO TABS: 1.0000 g | ORAL_TABLET | Freq: Four times a day (QID) | ORAL | 0 refills | Status: DC

## 2021-07-16 NOTE — Progress Notes (Signed)
Patient ID: [name], female   DOB: 03/04/1956, 66 y.o.   MRN: [id number]         Chief Complaint:: wellness exam and cough post covid, epigastric pain       HPI:  [name] is a 66 y.o. female here for wellness exam; declines covid booster, shingrix, o/w up to date                        Also lost over 5 lbs in the past wk with GI upset with epigastric discomfort, nausea mild intermittent but Denies worsening reflux, other abd pain, dysphagia, vomiting, bowel change or blood.   Quit tobacco x 3 mo.  Pt denies chest pain, increased sob or doe, wheezing, orthopnea, PND, increased LE swelling, palpitations, dizziness or syncope.   Pt denies polydipsia, polyuria, or new focal neuro s/s.   Pt denies fever, night sweats, loss of appetite, or other constitutional symptoms   Wt Readings from Last 3 Encounters:  07/16/21 125 lb (56.7 kg)  07/05/21 133 lb 3.2 oz (60.4 kg)  06/08/21 130 lb (59 kg)   BP Readings from Last 3 Encounters:  07/16/21 100/70  07/05/21 114/76  06/08/21 126/70   Immunization History  Administered Date(s) Administered   H1N1 07/11/2008   Influenza Split 02/20/2012, 02/27/2014   Influenza Whole 05/21/2007, 04/15/2008, 03/16/2009, 02/21/2010   Influenza,inj,Quad PF,6+ Mos 03/31/2015, 03/01/2016   Influenza,inj,quad, With Preservative 03/21/2018, 03/05/2019   Influenza-Unspecified 01/15/2014, 04/17/2020   Moderna Sars-Covid-2 Vaccination 10/05/2019, 11/04/2019, 05/17/2020   Pneumococcal Conjugate-13 03/19/2016   Pneumococcal Polysaccharide-23 05/01/2015, 08/08/2020   Td 01/18/2008   Tdap 11/26/2018   There are no preventive care reminders to display for this patient.     Past Medical History:  Diagnosis Date   Allergic rhinitis    Arthritis    Brain aneurysm    Chronic abdominal pain    COPD (chronic obstructive pulmonary disease) (HCC)    denies   Depression    bipolar   Fibromyalgia    Gastroparesis    GERD (gastroesophageal reflux disease)     H/O hiatal hernia    History of colonic polyps    HLD (hyperlipidemia)    IBS (irritable bowel syndrome)    chronic constipation   Migraines    OCD (obsessive compulsive disorder)    anxiety   Peripheral neuropathy    Past Surgical History:  Procedure Laterality Date   ABDOMINAL HYSTERECTOMY     APPENDECTOMY     CERVICAL DISC SURGERY     cholecystectomy     coil to brain aneurysm     COLONOSCOPY     ELBOW SURGERY Bilateral    tendonitis   IR ANGIO INTRA EXTRACRAN SEL COM CAROTID INNOMINATE BILAT MOD SED  07/31/2017   IR ANGIO INTRA EXTRACRAN SEL COM CAROTID INNOMINATE BILAT MOD SED  12/30/2019   IR ANGIO VERTEBRAL SEL VERTEBRAL BILAT MOD SED  07/31/2017   IR ANGIO VERTEBRAL SEL VERTEBRAL BILAT MOD SED  12/30/2019   NECK SURGERY     OOPHORECTOMY     RADIOLOGY WITH ANESTHESIA N/A 05/10/2013   Procedure: RADIOLOGY WITH ANESTHESIA;  Surgeon: Rob Hickman, MD;  Location: [facility];  Service: Radiology;  Laterality: N/A;   SHOULDER SURGERY Left    TUBAL LIGATION     VESICOVAGINAL FISTULA CLOSURE W/ TAH      reports that she has quit smoking. Her smoking use included cigarettes and e-cigarettes. She has a 2.60 pack-year  smoking history. She has never used smokeless tobacco. She reports that she does not drink alcohol and does not use drugs. family history includes Cancer in her father, mother, sister, and another family member; Coronary artery disease in an other family member; Diabetes in an other family member; Hypertension in an other family member; Leukemia in her sister; Seizures in an other family member. Allergies  Allergen Reactions   Ambien [Zolpidem Tartrate] Other (See Comments)    Memory issues   Hydrocodone Nausea Only    Can take it if its in a cough syrup    Lactose Intolerance (Gi)     Bloating, upset stomach    Nsaids     GI irritation, pt tolerates meloxicam and diclofenac    Propoxyphene N-Acetaminophen Itching   Tramadol Nausea And Vomiting   Chantix  [Varenicline] Other (See Comments)    "Makes bipolar worse" agitation, depression, sick to stomach   Current Outpatient Medications on File Prior to Visit  Medication Sig Dispense Refill   albuterol (PROVENTIL) (2.5 MG/3ML) 0.083% nebulizer solution      aspirin 325 MG tablet Take 325 mg by mouth daily.     atorvastatin (LIPITOR) 40 MG tablet TAKE 1 TABLET BY MOUTH  DAILY 90 tablet 3   Cholecalciferol (VITAMIN D) 50 MCG (2000 UT) tablet Take 2,000 Units by mouth daily.     diphenhydramine-acetaminophen (TYLENOL PM) 25-500 MG TABS tablet Take 1 tablet by mouth at bedtime as needed.     famotidine (PEPCID) 20 MG tablet 1 tablet Orally Twice a day     FLUCELVAX QUADRIVALENT SUSP injection      lamoTRIgine (LAMICTAL) 100 MG tablet TAKE 1 TABLET BY MOUTH IN  THE MORNING AND 3 TABLETS  BY MOUTH AT BEDTIME 360 tablet 3   linezolid (ZYVOX) 600 MG tablet Take 600 mg by mouth 2 (two) times daily. (Patient not taking: Reported on 07/20/2021)     naloxone (NARCAN) 4 MG/0.1ML LIQD nasal spray kit Place 0.4 mg into the nose as needed (opioid overdose).      Oxycodone HCl 10 MG TABS Take 1 tablet by mouth every 6 (six) hours as needed (pain).      pantoprazole (PROTONIX) 40 MG tablet Take 40 mg by mouth 2 (two) times daily.     PFIZER COVID-19 VAC BIVALENT injection      potassium chloride (KLOR-CON) 10 MEQ tablet TAKE 1 TABLET BY MOUTH  DAILY 90 tablet 3   promethazine (PHENERGAN) 12.5 MG tablet Take 1 tablet by mouth  every 6 hours as needed for nausea 90 tablet 0   umeclidinium bromide (INCRUSE ELLIPTA) 62.5 MCG/ACT AEPB Inhale into the lungs. (Patient not taking: Reported on 07/20/2021)     vitamin B-12 (CYANOCOBALAMIN) 1000 MCG tablet Take 1 tablet (1,000 mcg total) by mouth daily. 90 tablet 3   No current facility-administered medications on file prior to visit.        ROS:  All others reviewed and negative.  Objective        PE:  BP 100/70 (BP Location: Left Arm, Patient Position: Sitting, Cuff  Size: Large)    Pulse 87    Temp 98.2 F (36.8 C) (Oral)    Ht 5' 5"  (1.651 m)    Wt 125 lb (56.7 kg)    SpO2 98%    BMI 20.80 kg/m                 Constitutional: Pt appears in NAD  HENT: Head: NCAT.                Right Ear: External ear normal.                 Left Ear: External ear normal.                Eyes: . Pupils are equal, round, and reactive to light. Conjunctivae and EOM are normal               Nose: without d/c or deformity               Neck: Neck supple. Gross normal ROM               Cardiovascular: Normal rate and regular rhythm.                 Pulmonary/Chest: Effort normal and breath sounds without rales or wheezing.                Abd:  Soft,mild tender epigastric, ND, + BS, no organomegaly               Neurological: Pt is alert. At baseline orientation, motor grossly intact               Skin: Skin is warm. No rashes, no other new lesions, LE edema - none               Psychiatric: Pt behavior is normal without agitation   Micro: none  Cardiac tracings I have personally interpreted today:  none  Pertinent Radiological findings (summarize): none   Lab Results  Component Value Date   WBC 7.5 07/16/2021   HGB 13.8 07/16/2021   HCT 42.0 07/16/2021   PLT 346.0 07/16/2021   GLUCOSE 103 (H) 07/16/2021   CHOL 128 07/16/2021   TRIG 60.0 07/16/2021   HDL 56.70 07/16/2021   LDLDIRECT 102.7 08/21/2011   LDLCALC 59 07/16/2021   ALT 13 07/16/2021   AST 11 07/16/2021   NA 133 (L) 07/16/2021   K 3.9 07/16/2021   CL 98 07/16/2021   CREATININE 0.86 07/16/2021   BUN 11 07/16/2021   CO2 29 07/16/2021   TSH 2.07 07/16/2021   INR 0.9 12/30/2019   HGBA1C 4.8 07/16/2021   Assessment/Plan:  SHELBIE FRANKEN is a 66 y.o. White or Caucasian [1] female with  has a past medical history of Allergic rhinitis, Arthritis, Brain aneurysm, Chronic abdominal pain, COPD (chronic obstructive pulmonary disease) ([place]), Depression, Fibromyalgia, Gastroparesis, GERD  (gastroesophageal reflux disease), H/O hiatal hernia, History of colonic polyps, HLD (hyperlipidemia), IBS (irritable bowel syndrome), Migraines, OCD (obsessive compulsive disorder), and Peripheral neuropathy.  Vitamin D deficiency Last vitamin D Lab Results  Component Value Date   VD25OH 39.09 08/08/2020   Low, to start oral replacement   Encounter for well adult exam with abnormal findings Age and sex appropriate education and counseling updated with regular exercise and diet Referrals for preventative services - none needed Immunizations addressed - declines covid booster and shingrix Smoking counseling  - none needed Evidence for depression or other mood disorder - chronic anxiety no change Most recent labs reviewed. I have personally reviewed and have noted: 1) the patient's medical and social history 2) The patient's current medications and supplements 3) The patient's height, weight, and BMI have been recorded in the chart   B12 deficiency Lab Results  Component Value Date   VITAMINB12 >1504 (H) 07/16/2021   Overcontrolled, to cont oral  replacement - b12 1000 mcg at qod  Epigastric pain Mild recent worsening but with wt loss, for carafate qid x 1 mo, cont PPI, declines imaging for now, declines GI for now but to continue to monitor closely for further wt loss or worsening s/s  HLD (hyperlipidemia) Lab Results  Component Value Date   LDLCALC 59 07/16/2021   Stable, pt to continue current statin liptior   Hyperglycemia Lab Results  Component Value Date   HGBA1C 4.8 07/16/2021   Stable, pt to continue current medical treatment  - diet  Followup: Return in about 6 months (around 01/13/2022).  Cathlean Cower, MD 07/22/2021 7:49 PM [facility]

## 2021-07-16 NOTE — Assessment & Plan Note (Signed)
Last vitamin D Lab Results  Component Value Date   VD25OH 39.09 08/08/2020   Low, to start oral replacement

## 2021-07-16 NOTE — Patient Instructions (Signed)
Please take all new medication as prescribed - the carafate for the stomach for 1 month only  Please continue all other medications as before, and refills have been done if requested.  Please have the pharmacy call with any other refills you may need.  Please continue your efforts at being more active, low cholesterol diet, and weight control.  You are otherwise up to date with prevention measures today.  Please keep your appointments with your specialists as you may have planned  Please go to the LAB at the blood drawing area for the tests to be done  You will be contacted by phone if any changes need to be made immediately.  Otherwise, you will receive a letter about your results with an explanation, but please check with MyChart first.  Please remember to sign up for MyChart if you have not done so, as this will be important to you in the future with finding out test results, communicating by private email, and scheduling acute appointments online when needed.  Please make an Appointment to return in 6 months, or sooner if needed

## 2021-07-17 ENCOUNTER — Other Ambulatory Visit: Payer: Self-pay | Admitting: Internal Medicine

## 2021-07-17 LAB — HEPATIC FUNCTION PANEL
ALT: 13 U/L (ref 0–35)
AST: 11 U/L (ref 0–37)
Albumin: 4.2 g/dL (ref 3.5–5.2)
Alkaline Phosphatase: 76 U/L (ref 39–117)
Bilirubin, Direct: 0 mg/dL (ref 0.0–0.3)
Total Bilirubin: 0.3 mg/dL (ref 0.2–1.2)
Total Protein: 6.6 g/dL (ref 6.0–8.3)

## 2021-07-17 LAB — LIPID PANEL
Cholesterol: 128 mg/dL (ref 0–200)
HDL: 56.7 mg/dL (ref >39.00)
LDL Cholesterol: 59 mg/dL (ref 0–99)
NonHDL: 71.44
Total CHOL/HDL Ratio: 2
Triglycerides: 60 mg/dL (ref 0.0–149.0)
VLDL: 12 mg/dL (ref 0.0–40.0)

## 2021-07-17 LAB — BASIC METABOLIC PANEL
BUN: 11 mg/dL (ref 6–23)
CO2: 29 mEq/L (ref 19–32)
Calcium: 9.5 mg/dL (ref 8.4–10.5)
Chloride: 98 mEq/L (ref 96–112)
Creatinine, Ser: 0.86 mg/dL (ref 0.40–1.20)
GFR: 71.95 mL/min (ref >60.00)
Glucose, Bld: 103 mg/dL — ABNORMAL HIGH (ref 70–99)
Potassium: 3.9 mEq/L (ref 3.5–5.1)
Sodium: 133 mEq/L — ABNORMAL LOW (ref 135–145)

## 2021-07-17 LAB — URINALYSIS, ROUTINE W REFLEX MICROSCOPIC
Bilirubin Urine: NEGATIVE
Hgb urine dipstick: NEGATIVE
Ketones, ur: NEGATIVE
Nitrite: NEGATIVE
RBC / HPF: NONE SEEN (ref 0–?)
Specific Gravity, Urine: <=1.005 — AB (ref 1.000–1.030)
Total Protein, Urine: NEGATIVE
Urine Glucose: NEGATIVE
Urobilinogen, UA: 0.2 (ref 0.0–1.0)
pH: 6 (ref 5.0–8.0)

## 2021-07-17 LAB — LIPASE: Lipase: 10 U/L — ABNORMAL LOW (ref 11.0–59.0)

## 2021-07-17 MED ORDER — CIPROFLOXACIN HCL 500 MG PO TABS: 500.0000 mg | ORAL_TABLET | Freq: Two times a day (BID) | ORAL | 0 refills | Status: AC

## 2021-07-20 ENCOUNTER — Encounter: Payer: Self-pay | Admitting: Physician Assistant

## 2021-07-20 ENCOUNTER — Ambulatory Visit: Payer: Medicare Other | Admitting: Physician Assistant

## 2021-07-20 ENCOUNTER — Other Ambulatory Visit: Payer: Self-pay

## 2021-07-20 ENCOUNTER — Telehealth: Payer: Self-pay | Admitting: Internal Medicine

## 2021-07-20 DIAGNOSIS — F99 Mental disorder, not otherwise specified: Secondary | ICD-10-CM

## 2021-07-20 DIAGNOSIS — F5105 Insomnia due to other mental disorder: Secondary | ICD-10-CM | POA: Diagnosis not present

## 2021-07-20 DIAGNOSIS — F259 Schizoaffective disorder, unspecified: Secondary | ICD-10-CM

## 2021-07-20 DIAGNOSIS — F411 Generalized anxiety disorder: Secondary | ICD-10-CM

## 2021-07-20 DIAGNOSIS — F4321 Adjustment disorder with depressed mood: Secondary | ICD-10-CM | POA: Diagnosis not present

## 2021-07-20 MED ORDER — BUPROPION HCL ER (XL) 300 MG PO TB24: 300.0000 mg | ORAL_TABLET | Freq: Every day | ORAL | 3 refills | Status: DC

## 2021-07-20 MED ORDER — BUPROPION HCL ER (XL) 150 MG PO TB24: 150.0000 mg | ORAL_TABLET | Freq: Every day | ORAL | 3 refills | Status: DC

## 2021-07-20 MED ORDER — ARIPIPRAZOLE 5 MG PO TABS: ORAL_TABLET | ORAL | 1 refills | Status: DC

## 2021-07-20 MED ORDER — QUETIAPINE FUMARATE ER 400 MG PO TB24: 800.0000 mg | ORAL_TABLET | Freq: Every day | ORAL | 11 refills | Status: DC

## 2021-07-20 NOTE — Progress Notes (Signed)
[facility]  Patient ID: [name],  MRN: [id number]  PCP: Biagio Borg, MD  Date of Evaluation: 07/20/2021 Time spent:40 minutes  Chief Complaint:  Chief Complaint   Anxiety; Depression; Follow-up      HISTORY/CURRENT STATUS: HPI For routine med check.   Her friend [name], who used to come with her to appointments, died 2021/05/24. [name] was staying with her, sleeping in the same bed with her and when [name] woke up, she couldn't wake [name] up, called EMS and they said she'd been dead a few hours. Not really sure what she died of. Says she's handling it ok. But does get tearful.   She doesn't feel like doing anything physically, but mentally too. Wants to be alone a lot. Hard time sleeping b/c of the pain more than anything else. Can't eat much of anything, see ROS. Personal hygiene nl. ADLs are nl. No feelings of hopelessness, just sad about her friend.  Denies any changes in concentration, making decisions or remembering things.  Denies suicidal or homicidal thoughts.  Patient denies increased energy with decreased need for sleep, no increased talkativeness, no racing thoughts, no impulsivity or risky behaviors, no increased spending, no increased libido, no grandiosity, no increased irritability or anger, no paranoia, and no hallucinations.  Review of Systems  Constitutional:  Positive for malaise/fatigue.  HENT: Negative.    Eyes: Negative.   Respiratory: Negative.    Cardiovascular: Negative.   Gastrointestinal:  Positive for nausea and vomiting.       She is seeing GI for this.  Genitourinary:        Has a UTI at present  Musculoskeletal:  Positive for falls and joint pain.       Bilateral hip pain  Skin: Negative.   Neurological: Negative.   Endo/Heme/Allergies: Negative.   Psychiatric/Behavioral:         See HPI.     Individual Medical History/ Review of Systems: Changes? :Yes     she had covid, was hospitalized May 27, 2021 "I almost died."   Sees GI on 29-Jul-2021 for chronic n/v. Ever since she was in the hospitalize, she's had GI problems.  She fell a week or so ago and hurt her right hip. Will be starting PT next week.   Past medications for mental health diagnoses include: Ingrezza was ineffective, Lamictal, Risperdal, Zyprexa, Cogentin, Latuda, Cymbalta, lithium, Ambien, prazosin, Ativan, Wellbutrin, Zoloft, and probably others, trazodone possibly caused double vision and a fall. Seroquel, Gabapentin didn't help anxiety.  Allergies: Ambien [zolpidem tartrate], Hydrocodone, Lactose intolerance (gi), Nsaids, Propoxyphene n-acetaminophen, Tramadol, and Chantix [varenicline]  Current Medications:  Current Outpatient Medications:    albuterol (PROVENTIL) (2.5 MG/3ML) 0.083% nebulizer solution, , Disp: , Rfl:    ARIPiprazole (ABILIFY) 5 MG tablet, 1/2 po q am for 2 weeks, then increase to 1 po q am., Disp: 30 tablet, Rfl: 1   aspirin 325 MG tablet, Take 325 mg by mouth daily., Disp: , Rfl:    atorvastatin (LIPITOR) 40 MG tablet, TAKE 1 TABLET BY MOUTH  DAILY, Disp: 90 tablet, Rfl: 3   Cholecalciferol (VITAMIN D) 50 MCG (2000 UT) tablet, Take 2,000 Units by mouth daily., Disp: , Rfl:    ciprofloxacin (CIPRO) 500 MG tablet, Take 1 tablet (500 mg total) by mouth 2 (two) times daily for 10 days., Disp: 20 tablet, Rfl: 0   diphenhydramine-acetaminophen (TYLENOL PM) 25-500 MG TABS tablet, Take 1 tablet by mouth at bedtime as needed., Disp: , Rfl:    famotidine (PEPCID)  20 MG tablet, 1 tablet Orally Twice a day, Disp: , Rfl:    lamoTRIgine (LAMICTAL) 100 MG tablet, TAKE 1 TABLET BY MOUTH IN  THE MORNING AND 3 TABLETS  BY MOUTH AT BEDTIME, Disp: 360 tablet, Rfl: 3   lamoTRIgine (LAMICTAL) 150 MG tablet, 2 tablet Orally Once  a day, Disp: , Rfl:    naloxone (NARCAN) 4 MG/0.1ML LIQD nasal spray kit, Place 0.4 mg into the nose as needed (opioid overdose). , Disp: , Rfl:    Oxycodone HCl 10 MG TABS, Take 1 tablet by mouth every 6 (six) hours as  needed (pain). , Disp: , Rfl:    pantoprazole (PROTONIX) 40 MG tablet, Take 40 mg by mouth 2 (two) times daily., Disp: , Rfl:    potassium chloride (KLOR-CON) 10 MEQ tablet, TAKE 1 TABLET BY MOUTH  DAILY, Disp: 90 tablet, Rfl: 3   promethazine (PHENERGAN) 12.5 MG tablet, Take 1 tablet by mouth  every 6 hours as needed for nausea, Disp: 90 tablet, Rfl: 0   sucralfate (CARAFATE) 1 g tablet, Take 1 tablet (1 g total) by mouth 4 (four) times daily., Disp: 120 tablet, Rfl: 0   vitamin B-12 (CYANOCOBALAMIN) 1000 MCG tablet, Take 1 tablet (1,000 mcg total) by mouth daily., Disp: 90 tablet, Rfl: 3   buPROPion (WELLBUTRIN XL) 150 MG 24 hr tablet, Take 1 tablet (150 mg total) by mouth daily. With 300 mg=450 mg, Disp: 90 tablet, Rfl: 3   buPROPion (WELLBUTRIN XL) 300 MG 24 hr tablet, Take 1 tablet (300 mg total) by mouth daily. Take with 150 mg= 450 mg, Disp: 90 tablet, Rfl: 3   FLUCELVAX QUADRIVALENT SUSP injection, , Disp: , Rfl:    linezolid (ZYVOX) 600 MG tablet, Take 600 mg by mouth 2 (two) times daily. (Patient not taking: Reported on 07/20/2021), Disp: , Rfl:    PFIZER COVID-19 VAC BIVALENT injection, , Disp: , Rfl:    QUEtiapine (SEROQUEL XR) 400 MG 24 hr tablet, Take 2 tablets (800 mg total) by mouth daily., Disp: 60 tablet, Rfl: 11   umeclidinium bromide (INCRUSE ELLIPTA) 62.5 MCG/ACT AEPB, Inhale into the lungs. (Patient not taking: Reported on 07/20/2021), Disp: , Rfl:  Medication Side Effects: none   Family Medical/ Social History: Changes? Her friend, [name] died.   MENTAL HEALTH EXAM:  There were no vitals taken for this visit.There is no height or weight on file to calculate BMI.  General Appearance: Casual and Well Groomed  Eye Contact:  Minimal  Speech:  Clear and Coherent and Normal Rate  Volume:  Normal  Mood:  Depressed  Affect:   sad  Thought Process:  Goal Directed and Descriptions of Associations: Circumstantial  Orientation:  Full (Time, Place, and Person)  Thought Content:  Logical   Suicidal Thoughts:  No  Homicidal Thoughts:  No  Memory:  Recent;   Fair Remote;   Good  Judgement:  Good  Insight:  Good  Psychomotor Activity:   walks with a cane  Concentration:  Concentration: Good  Recall:  Good  Fund of Knowledge: Good  Language: Good  Assets:  Desire for Improvement  ADL's:  Intact  Cognition: WNL  Prognosis:  Good   Labs  07/16/2021 Lipid panel completely normal Hemoglobin A1c normal at 4.8 BMP normal  05/21/2021 Hospital notes and labs reviewed.  DIAGNOSES:    ICD-10-CM   1. Schizoaffective disorder, unspecified type ([place])  F25.9     2. Generalized anxiety disorder  F41.1     3. Insomnia  due to other mental disorder  F51.05    F99     4. Grief  F43.21       Receiving Psychotherapy: No    RECOMMENDATIONS:  PDMP reviewed.  Oxycodone is known to me.  No red flags. I provided 40 minutes of face to face time during this encounter, including time spent before and after the visit in records review, medical decision making, counseling pertinent to today's visit, and charting.  We discussed her symptoms.  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  Even though she is on an atypical antipsychotic I think it would be beneficial to add Abilify even if it is short-term.  Will give low dose which is often enough to help with a low mood.  She would like to try it. Start Abilify 5 mg, one half p.o. every morning for 2 weeks, then 1 p.o. every morning. Continue Wellbutrin XL 450 mg p.o. daily.  Continue Lamictal 100 mg, 1 p.o. every morning and 3 p.o. nightly. Continue Seroquel XR 400 mg, 2 p.o. nightly. Recommend therapy. Return in 4 to 6 weeks.  Donnal Moat, PA-C

## 2021-07-20 NOTE — Telephone Encounter (Signed)
FYI - Patient was doe to have a physical therapy appointment today but she is going to miss this appointment because she is not feeling well and has another doctor's appointment scheduled.

## 2021-07-22 ENCOUNTER — Encounter: Payer: Self-pay | Admitting: Internal Medicine

## 2021-07-22 NOTE — Assessment & Plan Note (Signed)
Lab Results  Component Value Date   LDLCALC 59 07/16/2021   Stable, pt to continue current statin liptior

## 2021-07-22 NOTE — Assessment & Plan Note (Signed)
Mild recent worsening but with wt loss, for carafate qid x 1 mo, cont PPI, declines imaging for now, declines GI for now but to continue to monitor closely for further wt loss or worsening s/s

## 2021-07-22 NOTE — Assessment & Plan Note (Signed)
Lab Results  Component Value Date   HGBA1C 4.8 07/16/2021   Stable, pt to continue current medical treatment  - diet

## 2021-07-22 NOTE — Assessment & Plan Note (Signed)
Age and sex appropriate education and counseling updated with regular exercise and diet Referrals for preventative services - none needed Immunizations addressed - declines covid booster and shingrix Smoking counseling  - none needed Evidence for depression or other mood disorder - chronic anxiety no change Most recent labs reviewed. I have personally reviewed and have noted: 1) the patient's medical and social history 2) The patient's current medications and supplements 3) The patient's height, weight, and BMI have been recorded in the chart

## 2021-07-22 NOTE — Assessment & Plan Note (Signed)
Lab Results  Component Value Date   VITAMINB12 >1504 (H) 07/16/2021   Overcontrolled, to cont oral replacement - b12 1000 mcg at qod

## 2021-07-23 DIAGNOSIS — R1031 Right lower quadrant pain: Secondary | ICD-10-CM | POA: Diagnosis not present

## 2021-07-23 DIAGNOSIS — K219 Gastro-esophageal reflux disease without esophagitis: Secondary | ICD-10-CM | POA: Diagnosis not present

## 2021-07-23 DIAGNOSIS — R112 Nausea with vomiting, unspecified: Secondary | ICD-10-CM | POA: Diagnosis not present

## 2021-07-23 DIAGNOSIS — K5903 Drug induced constipation: Secondary | ICD-10-CM | POA: Diagnosis not present

## 2021-07-23 DIAGNOSIS — K5900 Constipation, unspecified: Secondary | ICD-10-CM | POA: Diagnosis not present

## 2021-07-25 DIAGNOSIS — S22080A Wedge compression fracture of T11-T12 vertebra, initial encounter for closed fracture: Secondary | ICD-10-CM | POA: Diagnosis not present

## 2021-07-27 DIAGNOSIS — S22080A Wedge compression fracture of T11-T12 vertebra, initial encounter for closed fracture: Secondary | ICD-10-CM | POA: Diagnosis not present

## 2021-08-01 DIAGNOSIS — S22080A Wedge compression fracture of T11-T12 vertebra, initial encounter for closed fracture: Secondary | ICD-10-CM | POA: Diagnosis not present

## 2021-08-02 ENCOUNTER — Ambulatory Visit: Payer: Medicare Other | Admitting: Family Medicine

## 2021-08-03 DIAGNOSIS — S22080A Wedge compression fracture of T11-T12 vertebra, initial encounter for closed fracture: Secondary | ICD-10-CM | POA: Diagnosis not present

## 2021-08-06 ENCOUNTER — Telehealth: Payer: Self-pay | Admitting: Internal Medicine

## 2021-08-06 NOTE — Telephone Encounter (Signed)
Notes faxed to [facility]

## 2021-08-06 NOTE — Telephone Encounter (Signed)
[place] from [facility] calling in  Would like most recent face to face notes from [facility] 01.30.23 faxed to [phone]

## 2021-08-07 ENCOUNTER — Ambulatory Visit: Payer: Medicare Other | Admitting: Family Medicine

## 2021-08-07 ENCOUNTER — Other Ambulatory Visit: Payer: Self-pay

## 2021-08-07 VITALS — BP 100/70 | HR 91 | Ht 65.0 in | Wt 125.0 lb

## 2021-08-07 DIAGNOSIS — M5416 Radiculopathy, lumbar region: Secondary | ICD-10-CM | POA: Diagnosis not present

## 2021-08-07 DIAGNOSIS — M7061 Trochanteric bursitis, right hip: Secondary | ICD-10-CM

## 2021-08-07 NOTE — Progress Notes (Signed)
[name], am serving as a Education administrator for Dr. [name].  [name] is a 66 y.o. female who presents to [place] [facility] today for f/u R hip pain. Pt was seen for this complaint on 06/30/21 at [facility] and was given an IM 60mg  Toradol injection. Pt was last seen by Dr. [name] on 07/05/21 and was given a R lateral illiac crest steroid injection and was referred to home health PT. Today, pt reports that her Right hip is still hurting and now the left hip is starting to cause her trouble again. Patient states she has been to four sessions of PT and feels like each visit the pain is worse. Patient states that the injection at the last visit only lasted an hour for any relief.   She does have some pain radiating down her right leg just below the knee to the lateral thigh.  However the majority of her pain is at the right lateral hip.  Dx imaging: 06/30/21 R hip & L-spine XR (done [facility])  Pertinent review of systems: No fevers or chills  Relevant historical information: COPD.  Hypotension.  Gastroparesis.   Exam:  BP 100/70    Pulse 91    Ht 5\' 5"  (1.651 m)    Wt 125 lb (56.7 kg)    SpO2 98%    BMI 20.80 kg/m  General: Well Developed, well nourished, and in no acute distress.   MSK: L-spine nontender to midline.  Decreased lumbar motion.  Lower extremity strength is reduced  Right hip normal-appearing Tender palpation at greater trochanter.  Hip abduction strength is diminished. Antalgic gait using a cane to ambulate.   Lab and Radiology Results  Hip greater trochanteric injection: right Consent obtained and timeout performed. Area of maximum tenderness palpated and identified. Skin cleaned with alcohol, cold spray applied. A 22g needle was used to access the greater trochanteric bursa. 40mg  of Kenalog and 2 mL of Marcaine were used to inject the trochanteric bursa. Patient tolerated the procedure well.    Impression    1.  No  acute fracture or malalignment.  2.  Mild degenerative changes of the bilateral hips, bilateral SI joints, and pubic symphysis.  3.  Incompletely imaged degenerative disc disease of the lumbar spine.  4.  Single surgical clip projects over the central pelvis. Narrative   X-RAY RIGHT HIP (2+ VIEWS), 06/30/2021 2:29 PM   INDICATION:  \ M25.551 Pain in right hip  COMPARISON: None. Procedure Note   [name], [name], MD - 06/30/2021  Formatting of this note might be different from the original.  [place] (2+ VIEWS), 06/30/2021 2:29 PM   INDICATION:  \ M25.551 Pain in right hip  COMPARISON: None.   CONCLUSION:   1.  No acute fracture or malalignment.  2.  Mild degenerative changes of the bilateral hips, bilateral SI joints, and pubic symphysis.  3.  Incompletely imaged degenerative disc disease of the lumbar spine.  4.  Single surgical clip projects over the central pelvis. Exam End: 06/30/21 14:29    Specimen Collected: 06/30/21 14:38 Last Resulted: 06/30/21 15:12  Received From: [facility]  Result Received: 07/05/21 07:40    Impression    1.  Age-indeterminate compression fracture of the T11 vertebral body with approximately 25% height loss. This finding appears to be present on a chest radiograph from 11/06/2018 and is favored chronic. Correlation with any recent outside imaging is recommended. MRI  of the thoracolumbar spine could also assess for signs of acuity, such as marrow edema.  2.  Slight levocurvature of the lumbar spine.  3.  Mild to moderate multilevel degenerative disc disease and facet hypertrophy, most pronounced at L4-L5 and L5-S1.  4.  Vascular calcifications.  5.  Right upper quadrant surgical clips.  6.  Osteopenia. Narrative   X-RAY LUMBOSACRAL SPINE (2-3 VIEWS), 06/30/2021 2:29 PM   INDICATION: fell onto hip and back \ M25.551 Pain in right hip  COMPARISON: Chest radiograph 11/05/2008 Procedure Note   [name], [name], MD -  06/30/2021  Formatting of this note might be different from the original.  [place] (2-3 VIEWS), 06/30/2021 2:29 PM   INDICATION: fell onto hip and back \ M25.551 Pain in right hip  COMPARISON: Chest radiograph 11/05/2008   CONCLUSION:   1.  Age-indeterminate compression fracture of the T11 vertebral body with approximately 25% height loss. This finding appears to be present on a chest radiograph from 11/06/2018 and is favored chronic. Correlation with any recent outside imaging is recommended. MRI of the thoracolumbar spine could also assess for signs of acuity, such as marrow edema.  2.  Slight levocurvature of the lumbar spine.  3.  Mild to moderate multilevel degenerative disc disease and facet hypertrophy, most pronounced at L4-L5 and L5-S1.  4.  Vascular calcifications.  5.  Right upper quadrant surgical clips.  6.  Osteopenia. Exam End: 06/30/21 14:29    Specimen Collected: 06/30/21 14:33            Assessment and Plan: 66 y.o. female with right lateral hip pain thought to be due to trochanteric bursitis.  She was seen a month ago and had an injection at the iliac crest with her source of pain was greatest which did not help at all.  However she is hurting more at the greater trochanter which is more consistent with trochanteric bursitis.  We will inject this area today and continue home exercise program and home health physical therapy.  However she is having quite a lot of pain and dysfunction and disability.  I think at this point an MRI is indicated to further explain her pain.  She does have a history of a gluteus medius tendon avulsion of the left hip and I am worried she may have something is serious on the right.  Plan for right hip MRI.  Additionally she has radiating pain down her leg and is not responding to conventional treatment for trochanteric bursitis.  Lumbar radiculopathy is definitely on the differential and a lumbar spine MRI is indicated as well to  help differentiate the source of her pain.  Recheck after these MRIs.   PDMP not reviewed this encounter. Orders Placed This Encounter  Procedures   MR Lumbar Spine Wo Contrast    Standing Status:   Future    Standing Expiration Date:   08/07/2022    Order Specific Question:   What is the patient's sedation requirement?    Answer:   No Sedation    Order Specific Question:   Does the patient have a pacemaker or implanted devices?    Answer:   No    Order Specific Question:   Preferred imaging location?    Answer:   Product/process development scientist (table limit-350lbs)   MR HIP RIGHT WO CONTRAST    Standing Status:   Future    Standing Expiration Date:   08/07/2022    Order Specific Question:   What is the  patient's sedation requirement?    Answer:   No Sedation    Order Specific Question:   Does the patient have a pacemaker or implanted devices?    Answer:   No    Order Specific Question:   Preferred imaging location?    Answer:   Product/process development scientist (table limit-350lbs)   No orders of the defined types were placed in this encounter.    Discussed warning signs or symptoms. Please see discharge instructions. Patient expresses understanding.   The above documentation has been reviewed and is accurate and complete [name], M.D.

## 2021-08-07 NOTE — Patient Instructions (Addendum)
Good to see you  MRI's ordered for Lumbar spine and right hip at [facility]. They will call you and schedule  Injection given today  Call or go to the ER if you develop a large red swollen joint with extreme pain or oozing puss.   Follow up after the [place] with Handle Strap - Rope Ladder Caddie Helper - Sitting, Sit Up Hoist for Elderly, Senior, Injury Recovery Patient, Pregnant, Handicap - Padded Hand Grip

## 2021-08-08 DIAGNOSIS — G8929 Other chronic pain: Secondary | ICD-10-CM | POA: Diagnosis not present

## 2021-08-08 DIAGNOSIS — M545 Low back pain, unspecified: Secondary | ICD-10-CM | POA: Diagnosis not present

## 2021-08-08 DIAGNOSIS — Z9181 History of falling: Secondary | ICD-10-CM | POA: Diagnosis not present

## 2021-08-08 DIAGNOSIS — M7061 Trochanteric bursitis, right hip: Secondary | ICD-10-CM | POA: Diagnosis not present

## 2021-08-08 DIAGNOSIS — Z79891 Long term (current) use of opiate analgesic: Secondary | ICD-10-CM | POA: Diagnosis not present

## 2021-08-08 DIAGNOSIS — K5900 Constipation, unspecified: Secondary | ICD-10-CM | POA: Diagnosis not present

## 2021-08-09 ENCOUNTER — Telehealth: Payer: Self-pay | Admitting: Physician Assistant

## 2021-08-09 NOTE — Telephone Encounter (Signed)
LVM to rtc 

## 2021-08-09 NOTE — Telephone Encounter (Signed)
Have her go back to taking 1/2 pill of the Abilify and lets see if that will decrease the tremors again.  Stay on 1/2 pill until our appointment

## 2021-08-09 NOTE — Telephone Encounter (Signed)
Pt called reporting she is having trimmers again. New meds Seroquel & Abilify. Could meds be causing? [phone] pt contact.

## 2021-08-09 NOTE — Telephone Encounter (Signed)
Pt stated she just started Abilify 2 weeks ago.She is not sure if the tremors are coming from the med.She stated they aren't as controlled as before.She started taking 1 tab Sunday,she had been taking 1/2 for 2 weeks

## 2021-08-10 DIAGNOSIS — Z9181 History of falling: Secondary | ICD-10-CM | POA: Diagnosis not present

## 2021-08-10 DIAGNOSIS — Z79891 Long term (current) use of opiate analgesic: Secondary | ICD-10-CM | POA: Diagnosis not present

## 2021-08-10 DIAGNOSIS — M545 Low back pain, unspecified: Secondary | ICD-10-CM | POA: Diagnosis not present

## 2021-08-10 DIAGNOSIS — K5900 Constipation, unspecified: Secondary | ICD-10-CM | POA: Diagnosis not present

## 2021-08-10 DIAGNOSIS — M7061 Trochanteric bursitis, right hip: Secondary | ICD-10-CM | POA: Diagnosis not present

## 2021-08-10 DIAGNOSIS — G8929 Other chronic pain: Secondary | ICD-10-CM | POA: Diagnosis not present

## 2021-08-10 NOTE — Telephone Encounter (Signed)
Pt informed

## 2021-08-12 ENCOUNTER — Other Ambulatory Visit: Payer: Self-pay

## 2021-08-12 ENCOUNTER — Ambulatory Visit (INDEPENDENT_AMBULATORY_CARE_PROVIDER_SITE_OTHER): Payer: Medicare Other

## 2021-08-12 DIAGNOSIS — M549 Dorsalgia, unspecified: Secondary | ICD-10-CM | POA: Diagnosis not present

## 2021-08-12 DIAGNOSIS — G8929 Other chronic pain: Secondary | ICD-10-CM

## 2021-08-12 DIAGNOSIS — M7061 Trochanteric bursitis, right hip: Secondary | ICD-10-CM

## 2021-08-12 DIAGNOSIS — M5416 Radiculopathy, lumbar region: Secondary | ICD-10-CM

## 2021-08-12 DIAGNOSIS — M48061 Spinal stenosis, lumbar region without neurogenic claudication: Secondary | ICD-10-CM | POA: Diagnosis not present

## 2021-08-12 DIAGNOSIS — M1611 Unilateral primary osteoarthritis, right hip: Secondary | ICD-10-CM | POA: Diagnosis not present

## 2021-08-12 DIAGNOSIS — M545 Low back pain, unspecified: Secondary | ICD-10-CM

## 2021-08-12 DIAGNOSIS — M5127 Other intervertebral disc displacement, lumbosacral region: Secondary | ICD-10-CM | POA: Diagnosis not present

## 2021-08-12 DIAGNOSIS — M47816 Spondylosis without myelopathy or radiculopathy, lumbar region: Secondary | ICD-10-CM | POA: Diagnosis not present

## 2021-08-13 ENCOUNTER — Other Ambulatory Visit: Payer: Medicare Other

## 2021-08-13 NOTE — Progress Notes (Signed)
I, Wendy Poet, LAT, ATC, am serving as scribe for Dr. [name].  [name] is a 66 y.o. female who presents to [place] at [facility] today for f/u of B hip pain.  She was last seen by Dr. [name] on 08/07/21 and had a R hip GT steroid injection.  She has tried home health PT but felt that made her pain worse.  Due to her worsening pain, a R hip and L-spine MRI were ordered.  Today, pt reports her bilat hip and back are very painful. Pt had pain in her R hip for about 2 hours after receiving the steroid injection. No numbness/tingling noted.  Diagnostic testing: R hip and L-spine MRI- 08/12/21; L hip MRI- 03/12/20; L hip XR- 12/07/19  Pertinent review of systems: No fevers or chills  Relevant historical information: COPD   Exam:  BP 118/80    Pulse 88    Ht 5\' 5"  (1.651 m)    Wt 126 lb 3.2 oz (57.2 kg)    SpO2 99%    BMI 21.00 kg/m  General: Well Developed, well nourished, and in no acute distress.   MSK:  L-spine: Nontender midline.  Decreased lumbar motion. Lower extremity strength is intact except for reduced bilateral hip abduction    Lab and Radiology Results No results found for this or any previous visit (from the past 72 hour(s)). MR Lumbar Spine Wo Contrast  Result Date: 08/13/2021 CLINICAL DATA:  Low back pain. Clinical concern for cauda equina syndrome. EXAM: MRI LUMBAR SPINE WITHOUT CONTRAST TECHNIQUE: Multiplanar, multisequence MR imaging of the lumbar spine was performed. No intravenous contrast was administered. COMPARISON:  MR lumbar 01/30/2008; X-ray lumbar 05/11/2008. CT abdomen pelvis 02/11/2018 FINDINGS: Segmentation:  Standard. Alignment:  Mild levoconvex curvature. Vertebrae: Degenerative endplate edema at X2-J1. No acute fracture, discitis, or aggressive osseous lesion. There is also marrow edema along the right facet at L4-L5. Conus medullaris and cauda equina: Conus extends to the L1 level. Conus and cauda equina appear normal.  Paraspinal and other soft tissues: There is bandlike low T1/T2 signal in the left posterior pararenal space coursing from the paraspinal musculature to the abdominal wall, likely representing of fibrous tissue/scarring, potentially sequelae of a prior procedure, correlate with history. No correlate on prior CT in 2019 or MRI in 2009. No other significant findings. Disc levels: T11-T12: No significant spinal canal or neural foraminal narrowing. T12-L1: Trace retrolisthesis. No significant spinal canal or neural foraminal stenosis. L1-L2: No significant spinal canal or neural foraminal narrowing. L2-L3: Minimal disc bulging. Minimal spinal canal narrowing. No significant neural foraminal stenosis. L3-L4: Trace retrolisthesis. Mild foraminal disc bulging. Minimal spinal canal narrowing. There is mild bilateral neural foraminal stenosis. L4-L5: Right greater than left disc height loss with broad-based disc bulging, ligamentum flavum hypertrophy and right greater left facet arthropathy. There is mild left and severe right neural foraminal stenosis. Mild canal stenosis. L5-S1: Broad-based disc bulging, ligamentum flavum hypertrophy and left greater than right facet arthropathy. There is severe left neural foraminal stenosis. No right neural foraminal stenosis. No significant spinal canal stenosis. IMPRESSION: Multilevel degenerative changes of the lumbar spine, most significant at L4-L5 and L5-S1, as summarized below: L4-L5: Severe right neural foraminal stenosis due to disc bulging and facet arthropathy. Mild spinal canal stenosis and left-sided neural foraminal narrowing. L5-S1: Severe left neural foraminal stenosis due to disc bulging and facet arthropathy. No spinal canal or right-sided neural foraminal narrowing. Electronically Signed   By: Ileene Patrick.D.  On: 08/13/2021 10:00   MR HIP RIGHT WO CONTRAST  Result Date: 08/13/2021 CLINICAL DATA:  Chronic right hip and back pain for 2 months. Clinical concern for  labral tear, osteonecrosis and osteoarthritis. EXAM: MR OF THE RIGHT HIP WITHOUT CONTRAST TECHNIQUE: Multiplanar, multisequence MR imaging was performed. No intravenous contrast was administered. COMPARISON:  CT pelvis 02/11/2018. FINDINGS: Bones: There is no evidence of acute fracture, dislocation or avascular necrosis. No aggressive bone lesion. The visualized sacroiliac joints and symphysis pubis appear normal. There is degenerative disc disease at L4-L5 and L5-S1, see separately dictated lumbar spine MRI. Small perineural cysts at S2 and S3. Articular cartilage and labrum Articular cartilage:  Mild chondrosis. Labrum: Degenerative anterior superior labral tearing. Joint or bursal effusion Joint effusion: No significant hip joint effusion. Bursae: No evidence of trochanteric bursitis. Muscles and tendons Muscles and tendons: Chronic left-sided gluteus medius tendon tear with associated muscle atrophy. Mild left gluteus minimus tendinosis. The right-sided gluteal tendons are intact. The proximal hamstrings are intact.The adductors are intact. Other findings Miscellaneous: The visualized internal pelvic contents appear unremarkable. 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 Chronic left-sided gluteus medius tendon tear. Intact right-sided gluteal tendons. Electronically Signed   By: Maurine Simmering M.D.   On: 08/13/2021 10:07   I, [name], personally (independently) visualized and performed the interpretation of the images attached in this note.     Assessment and Plan: 66 y.o. female with bilateral lateral hip and lower leg pain right worse than left.  Right leg pain and lateral hip pain primarily thought to be due to L4-L5 lumbar radiculopathy.  She does have significant neuroforaminal stenosis at right L4-L5 and at left L5-S1.  She is a good candidate for an epidural steroid injection at this point especially if she is failing typical conservative  management strategies for hip abductor tendinopathy and trochanteric bursitis.  Additionally MRI of the right hip did not show evidence of right hip abductor tendinitis or bursitis.  Plan for epidural steroid injection primarily focused on right L4 or L5.  Trial of gabapentin mostly at bedtime at a low dose.  Patient will keep me updated with how she is feeling after the first epidural steroid injection.   PDMP not reviewed this encounter. Orders Placed This Encounter  Procedures   DG INJECT DIAG/THERA/INC NEEDLE/CATH/PLC EPI/LUMB/SAC W/IMG    R-sided radicular symptoms Level and technique per radiology    Standing Status:   Future    Standing Expiration Date:   08/14/2022    Order Specific Question:   Reason for Exam (SYMPTOM  OR DIAGNOSIS REQUIRED)    Answer:   Low back pain    Order Specific Question:   Preferred Imaging Location?    Answer:   [id number]   Meds ordered this encounter  Medications   gabapentin (NEURONTIN) 100 MG capsule    Sig: Take 1-3 pills at bedtime as needed for nerve pain    Dispense:  30 capsule    Refill:  3     Discussed warning signs or symptoms. Please see discharge instructions. Patient expresses understanding.   The above documentation has been reviewed and is accurate and complete [name], M.D.

## 2021-08-13 NOTE — Progress Notes (Signed)
Lumbar spine MRI shows severe pinched nerve on the right at L4-L5 and potential severe pinched nerve in the left at L5-S1.  This probably does explain your right lateral leg pain more than your hip MRI does.  We will talk about this during your follow-up visit with me tomorrow.

## 2021-08-13 NOTE — Progress Notes (Signed)
Right hip MRI shows mild arthritis and a possible labrum tear.  Recommend return to clinic to go over the results in full detail.

## 2021-08-14 ENCOUNTER — Ambulatory Visit (INDEPENDENT_AMBULATORY_CARE_PROVIDER_SITE_OTHER): Payer: Medicare Other | Admitting: Family Medicine

## 2021-08-14 ENCOUNTER — Other Ambulatory Visit: Payer: Self-pay

## 2021-08-14 VITALS — BP 118/80 | HR 88 | Ht 65.0 in | Wt 126.2 lb

## 2021-08-14 DIAGNOSIS — M25551 Pain in right hip: Secondary | ICD-10-CM | POA: Diagnosis not present

## 2021-08-14 DIAGNOSIS — M5416 Radiculopathy, lumbar region: Secondary | ICD-10-CM | POA: Diagnosis not present

## 2021-08-14 MED ORDER — GABAPENTIN 100 MG PO CAPS: ORAL_CAPSULE | ORAL | 3 refills | Status: DC

## 2021-08-14 NOTE — Patient Instructions (Addendum)
Thank you for coming in today.   Stop aspirin now  Please call [facility] at [phone] to schedule your spine injection.    I've sent a prescription for Gabapentin to your pharmacy. Take 1-3 pills as needed at bedtime for nerve pain.  Recheck back as needed

## 2021-08-15 ENCOUNTER — Ambulatory Visit: Payer: Medicare Other | Admitting: Family Medicine

## 2021-08-15 DIAGNOSIS — Z9181 History of falling: Secondary | ICD-10-CM | POA: Diagnosis not present

## 2021-08-15 DIAGNOSIS — K5900 Constipation, unspecified: Secondary | ICD-10-CM | POA: Diagnosis not present

## 2021-08-15 DIAGNOSIS — M961 Postlaminectomy syndrome, not elsewhere classified: Secondary | ICD-10-CM | POA: Diagnosis not present

## 2021-08-15 DIAGNOSIS — G8929 Other chronic pain: Secondary | ICD-10-CM | POA: Diagnosis not present

## 2021-08-15 DIAGNOSIS — M5136 Other intervertebral disc degeneration, lumbar region: Secondary | ICD-10-CM | POA: Diagnosis not present

## 2021-08-15 DIAGNOSIS — M7061 Trochanteric bursitis, right hip: Secondary | ICD-10-CM | POA: Diagnosis not present

## 2021-08-15 DIAGNOSIS — M545 Low back pain, unspecified: Secondary | ICD-10-CM | POA: Diagnosis not present

## 2021-08-15 DIAGNOSIS — M4726 Other spondylosis with radiculopathy, lumbar region: Secondary | ICD-10-CM | POA: Diagnosis not present

## 2021-08-15 DIAGNOSIS — Z79891 Long term (current) use of opiate analgesic: Secondary | ICD-10-CM | POA: Diagnosis not present

## 2021-08-15 DIAGNOSIS — M542 Cervicalgia: Secondary | ICD-10-CM | POA: Diagnosis not present

## 2021-08-16 ENCOUNTER — Ambulatory Visit: Payer: Medicare Other | Admitting: Family Medicine

## 2021-08-17 ENCOUNTER — Other Ambulatory Visit: Payer: Medicare Other

## 2021-08-29 ENCOUNTER — Telehealth: Payer: Self-pay | Admitting: Physician Assistant

## 2021-08-29 NOTE — Telephone Encounter (Signed)
See message from patient. Called her and she says she thinks she is fine. Denies SI. She is not sleeping well. Sometimes she goes for more than 24 hours w/o sleeping, other times she sleeps and thinks she has slept a long time but it has only been about 1-1/2 hours. She said last night she took apart her steam mop and fixed it.  She said she has a pinched nerve in her back and her body can't keep up with her mind. She does something, goes and lays down for a while, then gets up to do something else.  ?

## 2021-08-29 NOTE — Telephone Encounter (Signed)
Patient notified of recommendations. Said she refilled about a week ago. Told her to let us know when she is running low and we'd send new Rx in.  ?

## 2021-08-29 NOTE — Telephone Encounter (Signed)
[name]'s son told her to call us or he would. [name] states that she has not been sleeping well, probably 2 hours a night and states she is on a high and stays busy all day. She states she continues to take all her medications at same time everyday and does not skip any of them. She is concerned about the highs she is having now and the sleepless nights. Her phone number is [phone]. ?

## 2021-08-29 NOTE — Telephone Encounter (Signed)
Have her increase Abilify 5 mg to 1.5 pills.  She is currently taking 1.  Make sure she takes it in the mornings.  Thank you

## 2021-08-31 ENCOUNTER — Ambulatory Visit: Payer: Medicare Other | Admitting: Physician Assistant

## 2021-09-09 ENCOUNTER — Other Ambulatory Visit: Payer: Self-pay | Admitting: Physician Assistant

## 2021-09-11 DIAGNOSIS — M961 Postlaminectomy syndrome, not elsewhere classified: Secondary | ICD-10-CM | POA: Diagnosis not present

## 2021-09-11 DIAGNOSIS — M4726 Other spondylosis with radiculopathy, lumbar region: Secondary | ICD-10-CM | POA: Diagnosis not present

## 2021-09-14 ENCOUNTER — Ambulatory Visit (INDEPENDENT_AMBULATORY_CARE_PROVIDER_SITE_OTHER): Payer: Medicare Other | Admitting: Physician Assistant

## 2021-09-14 ENCOUNTER — Encounter: Payer: Self-pay | Admitting: Physician Assistant

## 2021-09-14 DIAGNOSIS — F411 Generalized anxiety disorder: Secondary | ICD-10-CM

## 2021-09-14 DIAGNOSIS — F3010 Manic episode without psychotic symptoms, unspecified: Secondary | ICD-10-CM

## 2021-09-14 DIAGNOSIS — F5105 Insomnia due to other mental disorder: Secondary | ICD-10-CM

## 2021-09-14 DIAGNOSIS — F259 Schizoaffective disorder, unspecified: Secondary | ICD-10-CM | POA: Diagnosis not present

## 2021-09-14 DIAGNOSIS — F4321 Adjustment disorder with depressed mood: Secondary | ICD-10-CM | POA: Diagnosis not present

## 2021-09-14 DIAGNOSIS — F99 Mental disorder, not otherwise specified: Secondary | ICD-10-CM

## 2021-09-14 MED ORDER — QUETIAPINE FUMARATE ER 400 MG PO TB24: 400.0000 mg | ORAL_TABLET | Freq: Every day | ORAL | 3 refills | Status: DC

## 2021-09-14 MED ORDER — ARIPIPRAZOLE 10 MG PO TABS: 10.0000 mg | ORAL_TABLET | Freq: Every day | ORAL | 0 refills | Status: DC

## 2021-09-14 NOTE — Progress Notes (Signed)
[facility] ? ?Patient ID: [name],  ?MRN: [id number] ? ?PCP: Biagio Borg, MD ? ?Date of Evaluation: 09/14/2021 ?Time spent:30 minutes ? ?Chief Complaint:  ?Chief Complaint   ?Depression; Follow-up ?  ? ? ?HISTORY/CURRENT STATUS: ?HPI For routine med check.  ? ?We started Abilify 2 months ago. Wasn't tolerating it but she kept on taking it b/c couldn't cut it. Unable to tolerate Seroquel 800 mg so she cut down to 400 mg. Was too drowsy all day. Tremor isn't as bad now. Has known essential tremor. Gets worse if stressed and when doing tedious things. Walks with a cane, not new.  ? ?Patient denies loss of interest in usual activities and is able to enjoy things.  Denies decreased energy or motivation.  Appetite has not changed.  No extreme sadness, tearfulness, or feelings of hopelessness.  Denies any changes in concentration, making decisions or remembering things. Not crying easily, unless she thinks about her friend who died in 2023/07/03. Anxiety is controlled. Denies suicidal or homicidal thoughts. ? ?Has been spending more money, in March had to get her son and DIL had to help her pay her bills. Feels hyper sometimes, but sleeps ok unless the pain is bad. Impulsive buying has stopped now. Thinks losing her friend has been a trigger. No paranoia or hallucinations.  ? ?Individual Medical History/ Review of Systems: Changes? :Yes    changed to Trulance by GI. Chronic pain and tremor off and on. See HPI. ? ?Past medications for mental health diagnoses include: ?Ingrezza was ineffective, Lamictal, Risperdal, Zyprexa, Cogentin, Latuda, Cymbalta, lithium, Ambien, prazosin, Ativan, Wellbutrin, Zoloft, and probably others, trazodone possibly caused double vision and a fall. Seroquel, Gabapentin didn't help anxiety. ? ?Allergies: Ambien [zolpidem tartrate], Hydrocodone, Lactose intolerance (gi), Nsaids, Propoxyphene n-acetaminophen, Tramadol, and Chantix [varenicline] ? ?Current Medications:  ?Current  Outpatient Medications:  ?  albuterol (PROVENTIL) (2.5 MG/3ML) 0.083% nebulizer solution, , Disp: , Rfl:  ?  ARIPiprazole (ABILIFY) 10 MG tablet, Take 1 tablet (10 mg total) by mouth daily., Disp: 30 tablet, Rfl: 0 ?  aspirin 325 MG tablet, Take 325 mg by mouth daily., Disp: , Rfl:  ?  atorvastatin (LIPITOR) 40 MG tablet, TAKE 1 TABLET BY MOUTH  DAILY, Disp: 90 tablet, Rfl: 3 ?  buPROPion (WELLBUTRIN XL) 150 MG 24 hr tablet, Take 1 tablet (150 mg total) by mouth daily. With 300 mg=450 mg, Disp: 90 tablet, Rfl: 3 ?  buPROPion (WELLBUTRIN XL) 300 MG 24 hr tablet, Take 1 tablet (300 mg total) by mouth daily. Take with 150 mg= 450 mg, Disp: 90 tablet, Rfl: 3 ?  Cholecalciferol (VITAMIN D) 50 MCG (2000 UT) tablet, Take 2,000 Units by mouth daily., Disp: , Rfl:  ?  diphenhydramine-acetaminophen (TYLENOL PM) 25-500 MG TABS tablet, Take 1 tablet by mouth at bedtime as needed., Disp: , Rfl:  ?  famotidine (PEPCID) 20 MG tablet, 1 tablet Orally Twice a day, Disp: , Rfl:  ?  FLUCELVAX QUADRIVALENT SUSP injection, , Disp: , Rfl:  ?  gabapentin (NEURONTIN) 100 MG capsule, Take 1-3 pills at bedtime as needed for nerve pain, Disp: 30 capsule, Rfl: 3 ?  lamoTRIgine (LAMICTAL) 100 MG tablet, TAKE 1 TABLET BY MOUTH IN  THE MORNING AND 3 TABLETS  BY MOUTH AT BEDTIME, Disp: 360 tablet, Rfl: 3 ?  naloxone (NARCAN) 4 MG/0.1ML LIQD nasal spray kit, Place 0.4 mg into the nose as needed (opioid overdose). , Disp: , Rfl:  ?  omeprazole (PRILOSEC) 40 MG capsule,  Take 40 mg by mouth 2 (two) times daily., Disp: , Rfl:  ?  Oxycodone HCl 10 MG TABS, Take 1 tablet by mouth every 6 (six) hours as needed (pain). , Disp: , Rfl:  ?  PFIZER COVID-19 VAC BIVALENT injection, , Disp: , Rfl:  ?  Plecanatide (TRULANCE) 3 MG TABS, Take by mouth., Disp: , Rfl:  ?  potassium chloride (KLOR-CON) 10 MEQ tablet, TAKE 1 TABLET BY MOUTH  DAILY, Disp: 90 tablet, Rfl: 3 ?  promethazine (PHENERGAN) 12.5 MG tablet, Take 1 tablet by mouth  every 6 hours as needed for  nausea, Disp: 90 tablet, Rfl: 0 ?  sucralfate (CARAFATE) 1 g tablet, Take 1 tablet (1 g total) by mouth 4 (four) times daily., Disp: 120 tablet, Rfl: 0 ?  vitamin B-12 (CYANOCOBALAMIN) 1000 MCG tablet, Take 1 tablet (1,000 mcg total) by mouth daily., Disp: 90 tablet, Rfl: 3 ?  lubiprostone (AMITIZA) 24 MCG capsule, Take by mouth. (Patient not taking: Reported on 09/14/2021), Disp: , Rfl:  ?  QUEtiapine (SEROQUEL XR) 400 MG 24 hr tablet, Take 1 tablet (400 mg total) by mouth daily., Disp: 90 tablet, Rfl: 3 ?Medication Side Effects: none  ? ?Family Medical/ Social History: Changes? no ? ?MENTAL HEALTH EXAM: ? ?There were no vitals taken for this visit.There is no height or weight on file to calculate BMI.  ?General Appearance: Casual and Well Groomed  ?Eye Contact:  Fair  ?Speech:  Clear and Coherent and Normal Rate  ?Volume:  Normal  ?Mood:  Euthymic  ?Affect:  Congruent  ?Thought Process:  Goal Directed and Descriptions of Associations: Circumstantial  ?Orientation:  Full (Time, Place, and Person)  ?Thought Content: Logical   ?Suicidal Thoughts:  No  ?Homicidal Thoughts:  No  ?Memory:  Recent;   Fair ?Remote;   Good  ?Judgement:  Good  ?Insight:  Good  ?Psychomotor Activity:   Walks with a cane, no tremor present  ?Concentration:  Concentration: Good  ?Recall:  Good  ?Fund of Knowledge: Good  ?Language: Good  ?Assets:  Desire for Improvement  ?ADL's:  Intact  ?Cognition: WNL  ?Prognosis:  Good  ? ?Labs  ?07/16/2021 ?Lipid panel completely normal ?Hemoglobin A1c normal at 4.8 ?BMP normal ? ? ?DIAGNOSES:  ?  ICD-10-CM   ?1. Schizoaffective disorder, unspecified type ([place])  F25.9   ?  ?2. Generalized anxiety disorder  F41.1   ?  ?3. Manic behavior ([place])  F30.10   ?  ?4. Grief  F43.21   ?  ?5. Insomnia due to other mental disorder  F51.05   ? F99   ?  ? ? ? ?Receiving Psychotherapy: No  ? ? ?RECOMMENDATIONS:  ?PDMP reviewed.  Oxycodone is known to me.  No red flags. ?I provided 30 minutes of face to face time during this  encounter, including time spent before and after the visit in records review, medical decision making, counseling pertinent to today's visit, and charting.  ?Recommend increasing Abilify but stay at the lower dose of Seroquel until next visit.  She is having some manic symptoms and I think increasing the Abilify will help. ? ?Increase Abilify to 10 mg, 1 p.o. every morning. ?Continue Wellbutrin XL 450 mg p.o. daily.  ?Continue Lamictal 100 mg, 1 p.o. every morning and 3 p.o. nightly. ?Continue Seroquel XR 400 mg, 1 p.o. nightly. ?Recommend therapy. ?Return in 4wks ? ?Donnal Moat, PA-C  ?

## 2021-09-27 DIAGNOSIS — Z86010 Personal history of colonic polyps: Secondary | ICD-10-CM | POA: Diagnosis not present

## 2021-09-27 DIAGNOSIS — R1031 Right lower quadrant pain: Secondary | ICD-10-CM | POA: Diagnosis not present

## 2021-09-27 DIAGNOSIS — K5903 Drug induced constipation: Secondary | ICD-10-CM | POA: Diagnosis not present

## 2021-09-27 DIAGNOSIS — K219 Gastro-esophageal reflux disease without esophagitis: Secondary | ICD-10-CM | POA: Diagnosis not present

## 2021-10-04 ENCOUNTER — Ambulatory Visit: Payer: Medicare Other | Admitting: Internal Medicine

## 2021-10-08 ENCOUNTER — Ambulatory Visit: Payer: Medicare Other | Admitting: Internal Medicine

## 2021-10-10 DIAGNOSIS — M5136 Other intervertebral disc degeneration, lumbar region: Secondary | ICD-10-CM | POA: Diagnosis not present

## 2021-10-10 DIAGNOSIS — Z79891 Long term (current) use of opiate analgesic: Secondary | ICD-10-CM | POA: Diagnosis not present

## 2021-10-10 DIAGNOSIS — M961 Postlaminectomy syndrome, not elsewhere classified: Secondary | ICD-10-CM | POA: Diagnosis not present

## 2021-10-10 DIAGNOSIS — M542 Cervicalgia: Secondary | ICD-10-CM | POA: Diagnosis not present

## 2021-10-10 DIAGNOSIS — M4726 Other spondylosis with radiculopathy, lumbar region: Secondary | ICD-10-CM | POA: Diagnosis not present

## 2021-10-12 ENCOUNTER — Ambulatory Visit: Payer: Medicare Other | Admitting: Physician Assistant

## 2021-10-17 ENCOUNTER — Ambulatory Visit (INDEPENDENT_AMBULATORY_CARE_PROVIDER_SITE_OTHER): Payer: Medicare Other | Admitting: Internal Medicine

## 2021-10-17 ENCOUNTER — Ambulatory Visit (INDEPENDENT_AMBULATORY_CARE_PROVIDER_SITE_OTHER): Payer: Medicare Other

## 2021-10-17 DIAGNOSIS — R739 Hyperglycemia, unspecified: Secondary | ICD-10-CM

## 2021-10-17 DIAGNOSIS — S61012A Laceration without foreign body of left thumb without damage to nail, initial encounter: Secondary | ICD-10-CM | POA: Diagnosis not present

## 2021-10-17 DIAGNOSIS — E538 Deficiency of other specified B group vitamins: Secondary | ICD-10-CM

## 2021-10-17 DIAGNOSIS — L02512 Cutaneous abscess of left hand: Secondary | ICD-10-CM

## 2021-10-17 DIAGNOSIS — E559 Vitamin D deficiency, unspecified: Secondary | ICD-10-CM

## 2021-10-17 MED ORDER — CLINDAMYCIN HCL 300 MG PO CAPS: 300.0000 mg | ORAL_CAPSULE | Freq: Three times a day (TID) | ORAL | 0 refills | Status: DC

## 2021-10-17 NOTE — Progress Notes (Signed)
Patient ID: [name], female   DOB: 1955-09-23, 66 y.o.   MRN: [id number] ? ? ? ?    Chief Complaint: follow up eft thumb infection, recent teeth removed ? ?     HPI:  [name] is a 66 y.o. female here with c/o left thumb pain, red swelling that started at the edge of the nail that seems to be digging in to the thumb for the last month.  No high fever, chills.  Pt denies chest pain, increased sob or doe, wheezing, orthopnea, PND, increased LE swelling, palpitations, dizziness or syncope.   Pt denies polydipsia, polyuria, or new focal neuro s/s.    Pt denies fever, wt loss, night sweats, loss of appetite, or other constitutional symptoms  Did have teeth removed yesterday and rx antibx but has not yet started.  Does also have a NS appt pending, then plans to see ortho after at some point for left hip.  Due for shingles #1 ?      ?Wt Readings from Last 3 Encounters:  ?10/17/21 131 lb (59.4 kg)  ?08/14/21 126 lb 3.2 oz (57.2 kg)  ?08/07/21 125 lb (56.7 kg)  ? ?BP Readings from Last 3 Encounters:  ?10/17/21 128/76  ?08/14/21 118/80  ?08/07/21 100/70  ? ?      ?Past Medical History:  ?Diagnosis Date  ? Allergic rhinitis   ? Arthritis   ? Brain aneurysm   ? Chronic abdominal pain   ? COPD (chronic obstructive pulmonary disease) ([place])   ? denies  ? Depression   ? bipolar  ? Fibromyalgia   ? Gastroparesis   ? GERD (gastroesophageal reflux disease)   ? H/O hiatal hernia   ? History of colonic polyps   ? HLD (hyperlipidemia)   ? IBS (irritable bowel syndrome)   ? chronic constipation  ? Migraines   ? OCD (obsessive compulsive disorder)   ? anxiety  ? Peripheral neuropathy   ? ?Past Surgical History:  ?Procedure Laterality Date  ? ABDOMINAL HYSTERECTOMY    ? APPENDECTOMY    ? CERVICAL DISC SURGERY    ? cholecystectomy    ? coil to brain aneurysm    ? COLONOSCOPY    ? ELBOW SURGERY Bilateral   ? tendonitis  ? IR ANGIO INTRA EXTRACRAN SEL COM CAROTID INNOMINATE BILAT MOD SED  07/31/2017  ? IR ANGIO INTRA EXTRACRAN SEL  COM CAROTID INNOMINATE BILAT MOD SED  12/30/2019  ? IR ANGIO VERTEBRAL SEL VERTEBRAL BILAT MOD SED  07/31/2017  ? IR ANGIO VERTEBRAL SEL VERTEBRAL BILAT MOD SED  12/30/2019  ? NECK SURGERY    ? OOPHORECTOMY    ? RADIOLOGY WITH ANESTHESIA N/A 05/10/2013  ? Procedure: RADIOLOGY WITH ANESTHESIA;  Surgeon: Rob Hickman, MD;  Location: [facility];  Service: Radiology;  Laterality: N/A;  ? SHOULDER SURGERY Left   ? TUBAL LIGATION    ? VESICOVAGINAL FISTULA CLOSURE W/ TAH    ? ? reports that she has quit smoking. Her smoking use included cigarettes and e-cigarettes. She has a 2.60 pack-year smoking history. She has never used smokeless tobacco. She reports that she does not drink alcohol and does not use drugs. ?family history includes Cancer in her father, mother, sister, and another family member; Coronary artery disease in an other family member; Diabetes in an other family member; Hypertension in an other family member; Leukemia in her sister; Seizures in an other family member. ?Allergies  ?Allergen Reactions  ? Ambien [Zolpidem Tartrate] Other (See  Comments)  ?  Memory issues  ? Hydrocodone Nausea Only  ?  Can take it if its in a cough syrup   ? Lactose Intolerance (Gi)   ?  Bloating, upset stomach   ? Nsaids   ?  GI irritation, pt tolerates meloxicam and diclofenac   ? Propoxyphene N-Acetaminophen Itching  ? Tramadol Nausea And Vomiting  ? Chantix [Varenicline] Other (See Comments)  ?  "Makes bipolar worse" agitation, depression, sick to stomach  ? ?Current Outpatient Medications on File Prior to Visit  ?Medication Sig Dispense Refill  ? albuterol (PROVENTIL) (2.5 MG/3ML) 0.083% nebulizer solution     ? ARIPiprazole (ABILIFY) 10 MG tablet Take 1 tablet (10 mg total) by mouth daily. 30 tablet 0  ? aspirin 325 MG tablet Take 325 mg by mouth daily.    ? atorvastatin (LIPITOR) 40 MG tablet TAKE 1 TABLET BY MOUTH  DAILY 90 tablet 3  ? buPROPion (WELLBUTRIN XL) 150 MG 24 hr tablet Take 1 tablet (150 mg total) by mouth  daily. With 300 mg=450 mg 90 tablet 3  ? buPROPion (WELLBUTRIN XL) 300 MG 24 hr tablet Take 1 tablet (300 mg total) by mouth daily. Take with 150 mg= 450 mg 90 tablet 3  ? Cholecalciferol (VITAMIN D) 50 MCG (2000 UT) tablet Take 2,000 Units by mouth daily.    ? diphenhydramine-acetaminophen (TYLENOL PM) 25-500 MG TABS tablet Take 1 tablet by mouth at bedtime as needed.    ? FLUCELVAX QUADRIVALENT SUSP injection     ? lamoTRIgine (LAMICTAL) 100 MG tablet TAKE 1 TABLET BY MOUTH IN  THE MORNING AND 3 TABLETS  BY MOUTH AT BEDTIME 360 tablet 3  ? naloxone (NARCAN) 4 MG/0.1ML LIQD nasal spray kit Place 0.4 mg into the nose as needed (opioid overdose).     ? omeprazole (PRILOSEC) 40 MG capsule Take 40 mg by mouth 2 (two) times daily.    ? Oxycodone HCl 10 MG TABS Take 1 tablet by mouth every 6 (six) hours as needed (pain).     ? PFIZER COVID-19 VAC BIVALENT injection     ? potassium chloride (KLOR-CON) 10 MEQ tablet TAKE 1 TABLET BY MOUTH  DAILY 90 tablet 3  ? promethazine (PHENERGAN) 12.5 MG tablet Take 1 tablet by mouth  every 6 hours as needed for nausea 90 tablet 0  ? QUEtiapine (SEROQUEL XR) 400 MG 24 hr tablet Take 1 tablet (400 mg total) by mouth daily. 90 tablet 3  ? sucralfate (CARAFATE) 1 g tablet Take 1 tablet (1 g total) by mouth 4 (four) times daily. 120 tablet 0  ? vitamin B-12 (CYANOCOBALAMIN) 1000 MCG tablet Take 1 tablet (1,000 mcg total) by mouth daily. 90 tablet 3  ? famotidine (PEPCID) 20 MG tablet 1 tablet Orally Twice a day    ? gabapentin (NEURONTIN) 100 MG capsule Take 1-3 pills at bedtime as needed for nerve pain 30 capsule 3  ? Plecanatide (TRULANCE) 3 MG TABS Take by mouth.    ? ?No current facility-administered medications on file prior to visit.  ? ?     ROS:  All others reviewed and negative. ? ?Objective  ? ?     PE:  BP 128/76 (BP Location: Left Arm, Patient Position: Sitting, Cuff Size: Large)   Pulse 93   Temp 98.1 ?F (36.7 ?C) (Oral)   Ht 5' 5"  (1.651 m)   Wt 131 lb (59.4 kg)   SpO2  96%   BMI 21.80 kg/m?  ? ?  Constitutional: Pt appears in NAD ?              HENT: Head: NCAT.  ?              Right Ear: External ear normal.   ?              Left Ear: External ear normal.  ?              Eyes: . Pupils are equal, round, and reactive to light. Conjunctivae and EOM are normal ?              Nose: without d/c or deformity ?              Neck: Neck supple. Gross normal ROM ?              Cardiovascular: Normal rate and regular rhythm.   ?              Pulmonary/Chest: Effort normal and breath sounds without rales or wheezing.  ?              Abd:  Soft, NT, ND, + BS, no organomegaly ?              Neurological: Pt is alert. At baseline orientation, motor grossly intact ?              Skin: LE edema - none; left thumb with medial aspect ingrown nail with ingrown aspect ow red, tender, swelling without drainage  ?              Psychiatric: Pt behavior is normal without agitation  ? ?Micro: none ? ?Cardiac tracings I have personally interpreted today:  none ? ?Pertinent Radiological findings (summarize): none  ? ?Lab Results  ?Component Value Date  ? WBC 7.5 07/16/2021  ? HGB 13.8 07/16/2021  ? HCT 42.0 07/16/2021  ? PLT 346.0 07/16/2021  ? GLUCOSE 103 (H) 07/16/2021  ? CHOL 128 07/16/2021  ? TRIG 60.0 07/16/2021  ? HDL 56.70 07/16/2021  ? LDLDIRECT 102.7 08/21/2011  ? [place] 59 07/16/2021  ? ALT 13 07/16/2021  ? AST 11 07/16/2021  ? NA 133 (L) 07/16/2021  ? K 3.9 07/16/2021  ? CL 98 07/16/2021  ? CREATININE 0.86 07/16/2021  ? BUN 11 07/16/2021  ? CO2 29 07/16/2021  ? TSH 2.07 07/16/2021  ? INR 0.9 12/30/2019  ? HGBA1C 4.8 07/16/2021  ? ?Assessment/Plan:  ?AIME CARRERAS is a 66 y.o. White or Caucasian [1] female with  has a past medical history of Allergic rhinitis, Arthritis, Brain aneurysm, Chronic abdominal pain, COPD (chronic obstructive pulmonary disease) ([place]), Depression, Fibromyalgia, Gastroparesis, GERD (gastroesophageal reflux disease), H/O hiatal hernia, History of colonic  polyps, HLD (hyperlipidemia), IBS (irritable bowel syndrome), Migraines, OCD (obsessive compulsive disorder), and Peripheral neuropathy. ? ?Abscess of left thumb ?Small, for clindamycin course, plain film today, and

## 2021-10-17 NOTE — Patient Instructions (Signed)
You had the Shingrix (shingles) shot # 1 today ? ?Please take all new medication as prescribed - the clindamycin antibiotic ? ?NO NEED to take the amoxil from the dentist if you take the clindamycin ? ?Please continue all other medications as before, and refills have been done if requested. ? ?Please have the pharmacy call with any other refills you may need. ? ?Please keep your appointments with your specialists as you may have planned ? ?Please go to the XRAY Department in the first floor for the x-ray testing ? ?You will be contacted by phone if any changes need to be made immediately.  Otherwise, you will receive a letter about your results with an explanation, but please check with MyChart first. ? ?Please remember to sign up for MyChart if you have not done so, as this will be important to you in the future with finding out test results, communicating by private email, and scheduling acute appointments online when needed. ? ?Please make an Appointment to return in 6 months, or sooner if needed, but also go to the ED immediately for any worsening red, swelling, pus drainage, or fever, chills regarding the left thumb ?

## 2021-10-23 ENCOUNTER — Encounter: Payer: Self-pay | Admitting: Internal Medicine

## 2021-10-23 NOTE — Assessment & Plan Note (Signed)
Small, for clindamycin course, plain film today, and consider hand surgury referral ?

## 2021-10-23 NOTE — Assessment & Plan Note (Signed)
Last vitamin D Lab Results  Component Value Date   VD25OH 44.86 07/16/2021   Stable, cont oral replacement 

## 2021-10-23 NOTE — Assessment & Plan Note (Signed)
Lab Results  ?Component Value Date  ? HGBA1C 4.8 07/16/2021  ? ?Stable, pt to continue current medical treatment  - diet ? ?

## 2021-10-23 NOTE — Assessment & Plan Note (Signed)
Lab Results  ?Component Value Date  ? [id number] >1504 (H) 07/16/2021  ? ?Stable, cont oral replacement - b12 1000 mcg qd ? ?

## 2021-10-25 ENCOUNTER — Encounter: Payer: Self-pay | Admitting: Physician Assistant

## 2021-10-25 ENCOUNTER — Ambulatory Visit (INDEPENDENT_AMBULATORY_CARE_PROVIDER_SITE_OTHER): Payer: Medicare Other | Admitting: Physician Assistant

## 2021-10-25 DIAGNOSIS — F99 Mental disorder, not otherwise specified: Secondary | ICD-10-CM | POA: Diagnosis not present

## 2021-10-25 DIAGNOSIS — F259 Schizoaffective disorder, unspecified: Secondary | ICD-10-CM | POA: Diagnosis not present

## 2021-10-25 DIAGNOSIS — F5105 Insomnia due to other mental disorder: Secondary | ICD-10-CM | POA: Diagnosis not present

## 2021-10-25 DIAGNOSIS — F411 Generalized anxiety disorder: Secondary | ICD-10-CM | POA: Diagnosis not present

## 2021-10-25 MED ORDER — ARIPIPRAZOLE 10 MG PO TABS: 10.0000 mg | ORAL_TABLET | Freq: Every day | ORAL | 0 refills | Status: DC

## 2021-10-25 NOTE — Progress Notes (Signed)
[facility]  Patient ID: [name],  MRN: [id number]  PCP: Biagio Borg, MD  Date of Evaluation: 10/25/2021 Time spent:20 minutes  Chief Complaint:  Chief Complaint   Depression     HISTORY/CURRENT STATUS: HPI For routine med check.   Not as manic since increasing the Abilify 6 weeks ago. Able to sleep better. Her mind is still 'moving faster than my body but I'm doing better than I was.' Patient denies increased energy with decreased need for sleep, no increased talkativeness, no racing thoughts, no impulsivity or risky behaviors, no increased spending, no grandiosity, no increased irritability or anger, no paranoia, and no hallucinations.  Is able to enjoy things when possible, does hurt a lot so that limits her.  Denies decreased energy.  Denies decreased motivation.   ADLs and personal hygiene are normal.  Appetite has not changed.  Weight is stable.  No extreme sadness, tearfulness, or feelings of hopelessness.  Denies any changes in concentration, making decisions or remembering things.  Continues to live alone, has family in the area.  Not having anxiety very often anyway.  Denies suicidal or homicidal thoughts.  Denies dizziness, syncope, seizures, numbness, tingling, tics, unsteady gait, slurred speech, confusion.  States the chronic tremor comes and goes.  Has been diagnosed with familial tremor. Denies unexplained weight loss, frequent infections, or sores that heal slowly.  No polyphagia, polydipsia, or polyuria. Denies visual changes or paresthesias.    Individual Medical History/ Review of Systems: Changes? :Yes    chronic pain in back and hips  Past medications for mental health diagnoses include: Ingrezza was ineffective, Lamictal, Risperdal, Zyprexa, Cogentin, Latuda, Cymbalta, lithium, Ambien, prazosin, Ativan, Wellbutrin, Zoloft, and probably others, trazodone possibly caused double vision and a fall. Seroquel, Gabapentin didn't help  anxiety.  Allergies: Ambien [zolpidem tartrate], Hydrocodone, Lactose intolerance (gi), Nsaids, Propoxyphene n-acetaminophen, Tramadol, and Chantix [varenicline]  Current Medications:  Current Outpatient Medications:    albuterol (PROVENTIL) (2.5 MG/3ML) 0.083% nebulizer solution, , Disp: , Rfl:    aspirin 325 MG tablet, Take 325 mg by mouth daily., Disp: , Rfl:    atorvastatin (LIPITOR) 40 MG tablet, TAKE 1 TABLET BY MOUTH  DAILY, Disp: 90 tablet, Rfl: 3   buPROPion (WELLBUTRIN XL) 150 MG 24 hr tablet, Take 1 tablet (150 mg total) by mouth daily. With 300 mg=450 mg, Disp: 90 tablet, Rfl: 3   buPROPion (WELLBUTRIN XL) 300 MG 24 hr tablet, Take 1 tablet (300 mg total) by mouth daily. Take with 150 mg= 450 mg, Disp: 90 tablet, Rfl: 3   Cholecalciferol (VITAMIN D) 50 MCG (2000 UT) tablet, Take 2,000 Units by mouth daily., Disp: , Rfl:    clindamycin (CLEOCIN) 300 MG capsule, Take 1 capsule (300 mg total) by mouth 3 (three) times daily., Disp: 30 capsule, Rfl: 0   lamoTRIgine (LAMICTAL) 100 MG tablet, TAKE 1 TABLET BY MOUTH IN  THE MORNING AND 3 TABLETS  BY MOUTH AT BEDTIME, Disp: 360 tablet, Rfl: 3   naloxone (NARCAN) 4 MG/0.1ML LIQD nasal spray kit, Place 0.4 mg into the nose as needed (opioid overdose). , Disp: , Rfl:    omeprazole (PRILOSEC) 40 MG capsule, Take 40 mg by mouth 2 (two) times daily., Disp: , Rfl:    Oxycodone HCl 10 MG TABS, Take 1 tablet by mouth every 6 (six) hours as needed (pain). , Disp: , Rfl:    potassium chloride (KLOR-CON) 10 MEQ tablet, TAKE 1 TABLET BY MOUTH  DAILY, Disp: 90 tablet, Rfl: 3  promethazine (PHENERGAN) 12.5 MG tablet, Take 1 tablet by mouth  every 6 hours as needed for nausea, Disp: 90 tablet, Rfl: 0   QUEtiapine (SEROQUEL XR) 400 MG 24 hr tablet, Take 1 tablet (400 mg total) by mouth daily., Disp: 90 tablet, Rfl: 3   sucralfate (CARAFATE) 1 g tablet, Take 1 tablet (1 g total) by mouth 4 (four) times daily., Disp: 120 tablet, Rfl: 0   vitamin B-12  (CYANOCOBALAMIN) 1000 MCG tablet, Take 1 tablet (1,000 mcg total) by mouth daily., Disp: 90 tablet, Rfl: 3   ARIPiprazole (ABILIFY) 10 MG tablet, Take 1 tablet (10 mg total) by mouth daily., Disp: 90 tablet, Rfl: 0   diphenhydramine-acetaminophen (TYLENOL PM) 25-500 MG TABS tablet, Take 1 tablet by mouth at bedtime as needed. (Patient not taking: Reported on 10/25/2021), Disp: , Rfl:    famotidine (PEPCID) 20 MG tablet, 1 tablet Orally Twice a day, Disp: , Rfl:    FLUCELVAX QUADRIVALENT SUSP injection, , Disp: , Rfl:    gabapentin (NEURONTIN) 100 MG capsule, Take 1-3 pills at bedtime as needed for nerve pain, Disp: 30 capsule, Rfl: 3   PFIZER COVID-19 VAC BIVALENT injection, , Disp: , Rfl:    Plecanatide (TRULANCE) 3 MG TABS, Take by mouth., Disp: , Rfl:  Medication Side Effects: none   Family Medical/ Social History: Changes? no  MENTAL HEALTH EXAM:  There were no vitals taken for this visit.There is no height or weight on file to calculate BMI.  General Appearance: Casual and Well Groomed  Eye Contact:  Fair  Speech:  Clear and Coherent and Normal Rate  Volume:  Normal  Mood:  Euthymic  Affect:  Congruent  Thought Process:  Goal Directed and Descriptions of Associations: Circumstantial  Orientation:  Full (Time, Place, and Person)  Thought Content: Logical   Suicidal Thoughts:  No  Homicidal Thoughts:  No  Memory:  Recent;   Fair Remote;   Good  Judgement:  Good  Insight:  Good  Psychomotor Activity:   Walks with a cane, no tremor present  Concentration:  Concentration: Good and Attention Span: Fair  Recall:  Good  Fund of Knowledge: Good  Language: Good  Assets:  Desire for Improvement  ADL's:  Intact  Cognition: WNL  Prognosis:  Good   Labs  07/16/2021 Lipid panel completely normal Hemoglobin A1c normal at 4.8 BMP normal   DIAGNOSES:    ICD-10-CM   1. Schizoaffective disorder, unspecified type ([place])  F25.9     2. Generalized anxiety disorder  F41.1     3.  Insomnia due to other mental disorder  F51.05    F99       Receiving Psychotherapy: No    RECOMMENDATIONS:  PDMP reviewed.  Oxycodone is known to me.  I provided 20 minutes of face to face time during this encounter, including time spent before and after the visit in records review, medical decision making, counseling pertinent to today's visit, and charting.  She is doing well so no changes are necessary.  Continue Abilify 10 mg, 1 p.o. every morning. Continue Wellbutrin XL 450 mg p.o. daily.  Continue Lamictal 100 mg, 1 p.o. every morning and 3 p.o. nightly. Continue Seroquel XR 400 mg, 1 p.o. nightly. Recommend therapy. Return in 3 months.  Donnal Moat, PA-C

## 2021-11-19 DIAGNOSIS — M5136 Other intervertebral disc degeneration, lumbar region: Secondary | ICD-10-CM | POA: Diagnosis not present

## 2021-11-19 DIAGNOSIS — I739 Peripheral vascular disease, unspecified: Secondary | ICD-10-CM | POA: Diagnosis not present

## 2021-11-19 DIAGNOSIS — M47816 Spondylosis without myelopathy or radiculopathy, lumbar region: Secondary | ICD-10-CM | POA: Diagnosis not present

## 2021-11-19 DIAGNOSIS — M5416 Radiculopathy, lumbar region: Secondary | ICD-10-CM | POA: Diagnosis not present

## 2021-11-19 DIAGNOSIS — M48061 Spinal stenosis, lumbar region without neurogenic claudication: Secondary | ICD-10-CM | POA: Diagnosis not present

## 2021-11-21 ENCOUNTER — Encounter: Payer: Self-pay | Admitting: Internal Medicine

## 2021-11-21 DIAGNOSIS — H527 Unspecified disorder of refraction: Secondary | ICD-10-CM | POA: Diagnosis not present

## 2021-11-21 DIAGNOSIS — H52203 Unspecified astigmatism, bilateral: Secondary | ICD-10-CM | POA: Diagnosis not present

## 2021-11-21 DIAGNOSIS — H02831 Dermatochalasis of right upper eyelid: Secondary | ICD-10-CM | POA: Diagnosis not present

## 2021-11-21 DIAGNOSIS — H5340 Unspecified visual field defects: Secondary | ICD-10-CM | POA: Diagnosis not present

## 2021-11-21 DIAGNOSIS — H02834 Dermatochalasis of left upper eyelid: Secondary | ICD-10-CM | POA: Diagnosis not present

## 2021-11-21 DIAGNOSIS — H5010 Unspecified exotropia: Secondary | ICD-10-CM | POA: Diagnosis not present

## 2021-11-21 DIAGNOSIS — H25813 Combined forms of age-related cataract, bilateral: Secondary | ICD-10-CM | POA: Diagnosis not present

## 2021-11-21 DIAGNOSIS — H40003 Preglaucoma, unspecified, bilateral: Secondary | ICD-10-CM | POA: Diagnosis not present

## 2021-11-22 DIAGNOSIS — I739 Peripheral vascular disease, unspecified: Secondary | ICD-10-CM | POA: Diagnosis not present

## 2021-11-27 DIAGNOSIS — R1031 Right lower quadrant pain: Secondary | ICD-10-CM | POA: Diagnosis not present

## 2021-11-27 DIAGNOSIS — K219 Gastro-esophageal reflux disease without esophagitis: Secondary | ICD-10-CM | POA: Diagnosis not present

## 2021-11-27 DIAGNOSIS — K5903 Drug induced constipation: Secondary | ICD-10-CM | POA: Diagnosis not present

## 2021-11-27 DIAGNOSIS — M5416 Radiculopathy, lumbar region: Secondary | ICD-10-CM | POA: Insufficient documentation

## 2021-11-27 DIAGNOSIS — Z86010 Personal history of colonic polyps: Secondary | ICD-10-CM | POA: Diagnosis not present

## 2021-12-02 ENCOUNTER — Other Ambulatory Visit: Payer: Self-pay | Admitting: Internal Medicine

## 2021-12-02 NOTE — Telephone Encounter (Signed)
Please refill as per office routine med refill policy (all routine meds to be refilled for 3 mo or monthly (per pt preference) up to one year from last visit, then month to month grace period for 3 mo, then further med refills will have to be denied) ? ?

## 2021-12-05 DIAGNOSIS — Z01810 Encounter for preprocedural cardiovascular examination: Secondary | ICD-10-CM | POA: Diagnosis not present

## 2021-12-05 DIAGNOSIS — Z01818 Encounter for other preprocedural examination: Secondary | ICD-10-CM | POA: Diagnosis not present

## 2021-12-05 DIAGNOSIS — Z01812 Encounter for preprocedural laboratory examination: Secondary | ICD-10-CM | POA: Diagnosis not present

## 2021-12-05 DIAGNOSIS — Z720 Tobacco use: Secondary | ICD-10-CM | POA: Diagnosis not present

## 2021-12-05 DIAGNOSIS — J449 Chronic obstructive pulmonary disease, unspecified: Secondary | ICD-10-CM | POA: Diagnosis not present

## 2021-12-05 DIAGNOSIS — K219 Gastro-esophageal reflux disease without esophagitis: Secondary | ICD-10-CM | POA: Diagnosis not present

## 2021-12-05 DIAGNOSIS — M5416 Radiculopathy, lumbar region: Secondary | ICD-10-CM | POA: Diagnosis not present

## 2021-12-05 DIAGNOSIS — I671 Cerebral aneurysm, nonruptured: Secondary | ICD-10-CM | POA: Diagnosis not present

## 2021-12-05 DIAGNOSIS — E7800 Pure hypercholesterolemia, unspecified: Secondary | ICD-10-CM | POA: Diagnosis not present

## 2021-12-10 ENCOUNTER — Telehealth: Payer: Self-pay | Admitting: Physician Assistant

## 2021-12-10 DIAGNOSIS — Z79891 Long term (current) use of opiate analgesic: Secondary | ICD-10-CM | POA: Diagnosis not present

## 2021-12-10 DIAGNOSIS — M4726 Other spondylosis with radiculopathy, lumbar region: Secondary | ICD-10-CM | POA: Diagnosis not present

## 2021-12-10 DIAGNOSIS — M5136 Other intervertebral disc degeneration, lumbar region: Secondary | ICD-10-CM | POA: Diagnosis not present

## 2021-12-10 DIAGNOSIS — M961 Postlaminectomy syndrome, not elsewhere classified: Secondary | ICD-10-CM | POA: Diagnosis not present

## 2021-12-10 DIAGNOSIS — M542 Cervicalgia: Secondary | ICD-10-CM | POA: Diagnosis not present

## 2021-12-12 DIAGNOSIS — Z87891 Personal history of nicotine dependence: Secondary | ICD-10-CM | POA: Diagnosis not present

## 2021-12-12 DIAGNOSIS — M4726 Other spondylosis with radiculopathy, lumbar region: Secondary | ICD-10-CM | POA: Diagnosis not present

## 2021-12-12 DIAGNOSIS — M47816 Spondylosis without myelopathy or radiculopathy, lumbar region: Secondary | ICD-10-CM | POA: Diagnosis not present

## 2021-12-12 DIAGNOSIS — M5416 Radiculopathy, lumbar region: Secondary | ICD-10-CM | POA: Diagnosis not present

## 2021-12-12 DIAGNOSIS — K219 Gastro-esophageal reflux disease without esophagitis: Secondary | ICD-10-CM | POA: Diagnosis not present

## 2021-12-12 DIAGNOSIS — J449 Chronic obstructive pulmonary disease, unspecified: Secondary | ICD-10-CM | POA: Diagnosis not present

## 2021-12-12 DIAGNOSIS — E7800 Pure hypercholesterolemia, unspecified: Secondary | ICD-10-CM | POA: Diagnosis not present

## 2021-12-12 DIAGNOSIS — Z79899 Other long term (current) drug therapy: Secondary | ICD-10-CM | POA: Diagnosis not present

## 2022-01-08 DIAGNOSIS — Z86010 Personal history of colonic polyps: Secondary | ICD-10-CM | POA: Diagnosis not present

## 2022-01-08 DIAGNOSIS — Z79891 Long term (current) use of opiate analgesic: Secondary | ICD-10-CM | POA: Diagnosis not present

## 2022-01-08 DIAGNOSIS — M48061 Spinal stenosis, lumbar region without neurogenic claudication: Secondary | ICD-10-CM | POA: Diagnosis not present

## 2022-01-08 DIAGNOSIS — M5136 Other intervertebral disc degeneration, lumbar region: Secondary | ICD-10-CM | POA: Diagnosis not present

## 2022-01-08 DIAGNOSIS — Z602 Problems related to living alone: Secondary | ICD-10-CM | POA: Diagnosis not present

## 2022-01-08 DIAGNOSIS — R1031 Right lower quadrant pain: Secondary | ICD-10-CM | POA: Diagnosis not present

## 2022-01-08 DIAGNOSIS — M5116 Intervertebral disc disorders with radiculopathy, lumbar region: Secondary | ICD-10-CM | POA: Diagnosis not present

## 2022-01-08 DIAGNOSIS — K5903 Drug induced constipation: Secondary | ICD-10-CM | POA: Diagnosis not present

## 2022-01-08 DIAGNOSIS — K219 Gastro-esophageal reflux disease without esophagitis: Secondary | ICD-10-CM | POA: Diagnosis not present

## 2022-01-08 DIAGNOSIS — J449 Chronic obstructive pulmonary disease, unspecified: Secondary | ICD-10-CM | POA: Diagnosis not present

## 2022-01-08 DIAGNOSIS — I739 Peripheral vascular disease, unspecified: Secondary | ICD-10-CM | POA: Diagnosis not present

## 2022-01-08 DIAGNOSIS — Z4789 Encounter for other orthopedic aftercare: Secondary | ICD-10-CM | POA: Diagnosis not present

## 2022-01-10 ENCOUNTER — Telehealth: Payer: Self-pay | Admitting: Physician Assistant

## 2022-01-10 DIAGNOSIS — Z602 Problems related to living alone: Secondary | ICD-10-CM | POA: Diagnosis not present

## 2022-01-10 DIAGNOSIS — I739 Peripheral vascular disease, unspecified: Secondary | ICD-10-CM | POA: Diagnosis not present

## 2022-01-10 DIAGNOSIS — M5116 Intervertebral disc disorders with radiculopathy, lumbar region: Secondary | ICD-10-CM | POA: Diagnosis not present

## 2022-01-10 DIAGNOSIS — M5136 Other intervertebral disc degeneration, lumbar region: Secondary | ICD-10-CM | POA: Diagnosis not present

## 2022-01-10 DIAGNOSIS — J449 Chronic obstructive pulmonary disease, unspecified: Secondary | ICD-10-CM | POA: Diagnosis not present

## 2022-01-10 DIAGNOSIS — Z79891 Long term (current) use of opiate analgesic: Secondary | ICD-10-CM | POA: Diagnosis not present

## 2022-01-10 DIAGNOSIS — Z4789 Encounter for other orthopedic aftercare: Secondary | ICD-10-CM | POA: Diagnosis not present

## 2022-01-10 DIAGNOSIS — M48061 Spinal stenosis, lumbar region without neurogenic claudication: Secondary | ICD-10-CM | POA: Diagnosis not present

## 2022-01-10 NOTE — Telephone Encounter (Signed)
Patient notified of recommendations. She said she is not taking gabapentin. She said in the past she has been on a much higher dose and multiple times a day and it doesn't phase her. I'll F/U with her tomorrow.

## 2022-01-10 NOTE — Telephone Encounter (Signed)
Pharmacy - [facility]  Patient is on a "high" again. She said since Sunday she has been getting about 1 hour and 45 minutes of sleep. She is taking Abilify as prescribed. Said last night she took two Seroquel and she still only slept about 1 hour and 45 minutes. Her mind is just racing and due to the back surgery her body can't keep up. Surgery was on 6/28. Her speech is rapid. She has no other complaints.

## 2022-01-10 NOTE — Telephone Encounter (Signed)
Have her take Seroquel XR 400 mg, 2 po qhs.  I know that is what she took last night but she needs to continue.  I see on her med list that she has gabapentin 100 mg.  Please confirm that.  If so have her take 3 p.o. tonight, along with the double dose of Seroquel.  Let me know if I need to send in a prescription.  Thanks

## 2022-01-10 NOTE — Telephone Encounter (Signed)
Pt said that she has been on a high since Sunday morning that she has not been able to sleep. She recently had back surgery in June. She said that her body can't keep up with her mind do to the back surgery. She has an apt on 8/18. She needs to know if there is something that teresa can do. Please call her at [phone]

## 2022-01-11 ENCOUNTER — Other Ambulatory Visit: Payer: Self-pay | Admitting: Physician Assistant

## 2022-01-11 MED ORDER — MIRTAZAPINE 15 MG PO TABS
7.5000 mg | ORAL_TABLET | Freq: Every evening | ORAL | 0 refills | Status: DC | PRN
Start: 2022-01-11 — End: 2022-01-28

## 2022-01-11 NOTE — Telephone Encounter (Signed)
Patient notified

## 2022-01-11 NOTE — Telephone Encounter (Signed)
Did she sleep last night? How about racing thoughts? Is she on steroids by any chance? The mania could be from that, should improve after they get out of her system, in meantime stay on Seroquel 2 qhs. When she took Gabapentin at higher dose and more frequently in the past, did it not help a manic episode, sleep, or anxiety?

## 2022-01-11 NOTE — Telephone Encounter (Signed)
I'll send in Mirtazepine for sleep. Continue other meds as directed.

## 2022-01-11 NOTE — Telephone Encounter (Signed)
Patient said she slept about 2-1/2 hours last night but it was not restful sleep. She said she is also groggy and off balance (like I'm drunk) on awakening. She said it lasts until 10 of 11 AM. She said gabapentin hasn't helped anything - mania, sleep, or anxiety. She said she had been on it several times. Denied use of steroids.  The below is copied from her discharge note from back surgery.   Prescriptions Prescription Sig Dispensed Refills Start Date End Date  methocarbamol (ROBAXIN) 500 MG tablet   Take one tablet (500 mg dose) by mouth 4 (four) times daily for 30 days. 120 tablet   2 12/12/2021 01/11/2022  oxyCODONE HCl (ROXICODONE) 5 mg immediate release tablet   Take one tablet (5 mg dose) by mouth every 6 (six) hours as needed for Pain for up to 7 days. Take for breakthrough post op pain, can take in addition to chronic baseline oxycodone Max Daily Amount: 20 mg 28 tablet   0 12/12/2021 12/19/2021  cephALEXin (KEFLEX) 500 mg capsule   Take one capsule (500 mg dose) by mouth 3 (three) times a day for 5 days. 15 capsule   0 12/12/2021 12/17/2021

## 2022-01-15 DIAGNOSIS — Z4789 Encounter for other orthopedic aftercare: Secondary | ICD-10-CM | POA: Diagnosis not present

## 2022-01-15 DIAGNOSIS — M5116 Intervertebral disc disorders with radiculopathy, lumbar region: Secondary | ICD-10-CM | POA: Diagnosis not present

## 2022-01-15 DIAGNOSIS — M48061 Spinal stenosis, lumbar region without neurogenic claudication: Secondary | ICD-10-CM | POA: Diagnosis not present

## 2022-01-15 DIAGNOSIS — J449 Chronic obstructive pulmonary disease, unspecified: Secondary | ICD-10-CM | POA: Diagnosis not present

## 2022-01-15 DIAGNOSIS — M5136 Other intervertebral disc degeneration, lumbar region: Secondary | ICD-10-CM | POA: Diagnosis not present

## 2022-01-15 DIAGNOSIS — Z79891 Long term (current) use of opiate analgesic: Secondary | ICD-10-CM | POA: Diagnosis not present

## 2022-01-15 DIAGNOSIS — Z602 Problems related to living alone: Secondary | ICD-10-CM | POA: Diagnosis not present

## 2022-01-15 DIAGNOSIS — I739 Peripheral vascular disease, unspecified: Secondary | ICD-10-CM | POA: Diagnosis not present

## 2022-01-16 DIAGNOSIS — Z79891 Long term (current) use of opiate analgesic: Secondary | ICD-10-CM | POA: Diagnosis not present

## 2022-01-16 DIAGNOSIS — M961 Postlaminectomy syndrome, not elsewhere classified: Secondary | ICD-10-CM | POA: Diagnosis not present

## 2022-01-16 DIAGNOSIS — M542 Cervicalgia: Secondary | ICD-10-CM | POA: Diagnosis not present

## 2022-01-16 DIAGNOSIS — M4726 Other spondylosis with radiculopathy, lumbar region: Secondary | ICD-10-CM | POA: Diagnosis not present

## 2022-01-16 DIAGNOSIS — M5136 Other intervertebral disc degeneration, lumbar region: Secondary | ICD-10-CM | POA: Diagnosis not present

## 2022-01-17 DIAGNOSIS — I739 Peripheral vascular disease, unspecified: Secondary | ICD-10-CM | POA: Diagnosis not present

## 2022-01-17 DIAGNOSIS — Z79891 Long term (current) use of opiate analgesic: Secondary | ICD-10-CM | POA: Diagnosis not present

## 2022-01-17 DIAGNOSIS — M48061 Spinal stenosis, lumbar region without neurogenic claudication: Secondary | ICD-10-CM | POA: Diagnosis not present

## 2022-01-17 DIAGNOSIS — M5116 Intervertebral disc disorders with radiculopathy, lumbar region: Secondary | ICD-10-CM | POA: Diagnosis not present

## 2022-01-17 DIAGNOSIS — J449 Chronic obstructive pulmonary disease, unspecified: Secondary | ICD-10-CM | POA: Diagnosis not present

## 2022-01-17 DIAGNOSIS — M5136 Other intervertebral disc degeneration, lumbar region: Secondary | ICD-10-CM | POA: Diagnosis not present

## 2022-01-17 DIAGNOSIS — Z602 Problems related to living alone: Secondary | ICD-10-CM | POA: Diagnosis not present

## 2022-01-17 DIAGNOSIS — Z4789 Encounter for other orthopedic aftercare: Secondary | ICD-10-CM | POA: Diagnosis not present

## 2022-01-22 ENCOUNTER — Other Ambulatory Visit: Payer: Self-pay | Admitting: Internal Medicine

## 2022-01-22 DIAGNOSIS — M48061 Spinal stenosis, lumbar region without neurogenic claudication: Secondary | ICD-10-CM | POA: Diagnosis not present

## 2022-01-22 DIAGNOSIS — J449 Chronic obstructive pulmonary disease, unspecified: Secondary | ICD-10-CM | POA: Diagnosis not present

## 2022-01-22 DIAGNOSIS — M5136 Other intervertebral disc degeneration, lumbar region: Secondary | ICD-10-CM | POA: Diagnosis not present

## 2022-01-22 DIAGNOSIS — M5116 Intervertebral disc disorders with radiculopathy, lumbar region: Secondary | ICD-10-CM | POA: Diagnosis not present

## 2022-01-22 DIAGNOSIS — Z4789 Encounter for other orthopedic aftercare: Secondary | ICD-10-CM | POA: Diagnosis not present

## 2022-01-22 DIAGNOSIS — I739 Peripheral vascular disease, unspecified: Secondary | ICD-10-CM | POA: Diagnosis not present

## 2022-01-22 DIAGNOSIS — Z79891 Long term (current) use of opiate analgesic: Secondary | ICD-10-CM | POA: Diagnosis not present

## 2022-01-22 DIAGNOSIS — Z602 Problems related to living alone: Secondary | ICD-10-CM | POA: Diagnosis not present

## 2022-01-28 ENCOUNTER — Ambulatory Visit (INDEPENDENT_AMBULATORY_CARE_PROVIDER_SITE_OTHER): Payer: Medicare Other | Admitting: Physician Assistant

## 2022-01-28 ENCOUNTER — Encounter: Payer: Self-pay | Admitting: Physician Assistant

## 2022-01-28 DIAGNOSIS — F259 Schizoaffective disorder, unspecified: Secondary | ICD-10-CM | POA: Diagnosis not present

## 2022-01-28 DIAGNOSIS — F5105 Insomnia due to other mental disorder: Secondary | ICD-10-CM | POA: Diagnosis not present

## 2022-01-28 DIAGNOSIS — F99 Mental disorder, not otherwise specified: Secondary | ICD-10-CM | POA: Diagnosis not present

## 2022-01-28 DIAGNOSIS — F411 Generalized anxiety disorder: Secondary | ICD-10-CM

## 2022-01-28 MED ORDER — BELSOMRA 20 MG PO TABS: 20.0000 mg | ORAL_TABLET | Freq: Every evening | ORAL | 1 refills | Status: DC

## 2022-01-28 NOTE — Progress Notes (Unsigned)
[facility]  Patient ID: [name],  MRN: [id number]  PCP: Biagio Borg, MD  Date of Evaluation: 01/28/2022 Time spent:30 minutes  Chief Complaint:  Chief Complaint   Anxiety; Depression; Insomnia; Follow-up    HISTORY/CURRENT STATUS: HPI For routine med check.   Having a lot of trouble sleeping. Tried mirtazepine, but it caused dizziness so she stopped it. She doubled the Seroquel a few weeks ago, (see phone note on 01/10/2022) and still only gets 1.5 hours of sleep at a time, will get up and do stuff. Listens to music, or watches tv, or takes a walk, even in the middle of the night. Feels energetic, has increased talkativeness, racing thoughts, but denies impulsivity or risky behaviors, increased spending, increased libido, grandiosity, increased irritability or anger, paranoia, and no hallucinations.  Patient is able to enjoy things.  Energy and motivation are good.  No extreme sadness, tearfulness, or feelings of hopelessness.   ADLs and personal hygiene are normal.   Denies any changes in concentration, making decisions, or remembering things.  Appetite has not changed.  Weight is stable. Not having a lot of anxiety.  Denies suicidal or homicidal thoughts.  Individual Medical History/ Review of Systems: Changes? :Yes    had back surgery 5 weeks ago. L4/5, L5/S1 laminectomy  Past medications for mental health diagnoses include: Ingrezza was ineffective, Lamictal, Risperdal, Zyprexa, Cogentin, Latuda, Cymbalta, lithium, Ambien, prazosin, Ativan, Wellbutrin, Zoloft, and probably others, trazodone possibly caused double vision and a fall. Seroquel, Gabapentin didn't help anxiety, Mirtazepine caused dizziness  Allergies: Ambien [zolpidem tartrate], Hydrocodone, Lactose intolerance (gi), Nsaids, Propoxyphene n-acetaminophen, Tramadol, and Chantix [varenicline]  Current Medications:  Current Outpatient Medications:    albuterol (PROVENTIL) (2.5 MG/3ML) 0.083% nebulizer  solution, , Disp: , Rfl:    ARIPiprazole (ABILIFY) 10 MG tablet, Take 1 tablet (10 mg total) by mouth daily., Disp: 90 tablet, Rfl: 0   aspirin 325 MG tablet, Take 325 mg by mouth daily., Disp: , Rfl:    atorvastatin (LIPITOR) 40 MG tablet, TAKE 1 TABLET BY MOUTH  DAILY, Disp: 90 tablet, Rfl: 3   buPROPion (WELLBUTRIN XL) 150 MG 24 hr tablet, Take 1 tablet (150 mg total) by mouth daily. With 300 mg=450 mg, Disp: 90 tablet, Rfl: 3   buPROPion (WELLBUTRIN XL) 300 MG 24 hr tablet, Take 1 tablet (300 mg total) by mouth daily. Take with 150 mg= 450 mg, Disp: 90 tablet, Rfl: 3   Cholecalciferol (VITAMIN D) 50 MCG (2000 UT) tablet, Take 2,000 Units by mouth daily., Disp: , Rfl:    lamoTRIgine (LAMICTAL) 100 MG tablet, TAKE 1 TABLET BY MOUTH IN  THE MORNING AND 3 TABLETS  BY MOUTH AT BEDTIME, Disp: 360 tablet, Rfl: 3   naloxone (NARCAN) 4 MG/0.1ML LIQD nasal spray kit, Place 0.4 mg into the nose as needed (opioid overdose). , Disp: , Rfl:    omeprazole (PRILOSEC) 40 MG capsule, Take 40 mg by mouth 2 (two) times daily., Disp: , Rfl:    Oxycodone HCl 10 MG TABS, Take 1 tablet by mouth every 6 (six) hours as needed (pain). , Disp: , Rfl:    potassium chloride (KLOR-CON M) 10 MEQ tablet, TAKE 1 TABLET BY MOUTH  DAILY, Disp: 90 tablet, Rfl: 2   Probiotic Product (PROBIOTIC DAILY PO), Take by mouth., Disp: , Rfl:    promethazine (PHENERGAN) 12.5 MG tablet, Take 1 tablet by mouth  every 6 hours as needed for nausea, Disp: 90 tablet, Rfl: 0   QUEtiapine (SEROQUEL  XR) 400 MG 24 hr tablet, Take 1 tablet (400 mg total) by mouth daily. (Patient taking differently: Take 800 mg by mouth daily.), Disp: 90 tablet, Rfl: 3   sucralfate (CARAFATE) 1 g tablet, TAKE ONE TABLET BY MOUTH FOUR TIMES DAILY, Disp: 120 tablet, Rfl: 0   Suvorexant (BELSOMRA) 20 MG TABS, Take 20 mg by mouth at bedtime., Disp: 30 tablet, Rfl: 1   vitamin B-12 (CYANOCOBALAMIN) 1000 MCG tablet, Take 1 tablet (1,000 mcg total) by mouth daily., Disp: 90  tablet, Rfl: 3   clindamycin (CLEOCIN) 300 MG capsule, Take 1 capsule (300 mg total) by mouth 3 (three) times daily. (Patient not taking: Reported on 01/28/2022), Disp: 30 capsule, Rfl: 0   diphenhydramine-acetaminophen (TYLENOL PM) 25-500 MG TABS tablet, Take 1 tablet by mouth at bedtime as needed. (Patient not taking: Reported on 10/25/2021), Disp: , Rfl:    famotidine (PEPCID) 20 MG tablet, 1 tablet Orally Twice a day, Disp: , Rfl:    FLUCELVAX QUADRIVALENT SUSP injection, , Disp: , Rfl:    gabapentin (NEURONTIN) 100 MG capsule, Take 1-3 pills at bedtime as needed for nerve pain, Disp: 30 capsule, Rfl: 3   PFIZER COVID-19 VAC BIVALENT injection, , Disp: , Rfl:    Plecanatide (TRULANCE) 3 MG TABS, Take by mouth., Disp: , Rfl:  Medication Side Effects: none   Family Medical/ Social History: Changes? no  MENTAL HEALTH EXAM:  There were no vitals taken for this visit.There is no height or weight on file to calculate BMI.  General Appearance: Casual and Well Groomed  Eye Contact:  Fair  Speech:  Clear and Coherent and Normal Rate  Volume:  Normal  Mood:  Euthymic  Affect:  Congruent  Thought Process:  Goal Directed and Descriptions of Associations: Circumstantial  Orientation:  Full (Time, Place, and Person)  Thought Content: Logical   Suicidal Thoughts:  No  Homicidal Thoughts:  No  Memory:  Recent;   Fair Remote;   Good  Judgement:  Good  Insight:  Good  Psychomotor Activity:   walks slowly using a cane  Concentration:  Concentration: Good and Attention Span: Fair  Recall:  Good  Fund of Knowledge: Good  Language: Good  Assets:  Desire for Improvement  ADL's:  Intact  Cognition: WNL  Prognosis:  Good   Labs  12/05/2021 CBC without differential, WBC 7.5, hemoglobin 12.6, hematocrit 38.3, platelet count 366. CMP completely normal  Last lipid panel and hemoglobin A1c were on 07/16/2021.  DIAGNOSES:  No diagnosis found.   Receiving Psychotherapy: No    RECOMMENDATIONS:   PDMP reviewed.  Oxycodone is known to me.  I provided 30 minutes of face to face time during this encounter, including time spent before and after the visit in records review, medical decision making, counseling pertinent to today's visit, and charting.   Discussed sleep med meds. Recommend summer.  Benefits, risks and side effects were discussed and she would like to try it. Sleep hygiene discussed.  Continue Abilify 10 mg, 1 p.o. every morning. Continue Wellbutrin XL 450 mg p.o. daily.  Continue Lamictal 100 mg, 1 p.o. every morning and 3 p.o. nightly. Continue Seroquel XR 400 mg, 2 p.o. nightly. Start Belsomra 10 mg for 3 nights, 15 mg for 3 nights and then 20 mg nightly.  Samples for a total of 9 nights given and prescription sent for 20 mg. Recommend therapy. Return in 6 weeks.   Donnal Moat, PA-C

## 2022-01-29 DIAGNOSIS — Z79891 Long term (current) use of opiate analgesic: Secondary | ICD-10-CM | POA: Diagnosis not present

## 2022-01-29 DIAGNOSIS — Z4789 Encounter for other orthopedic aftercare: Secondary | ICD-10-CM | POA: Diagnosis not present

## 2022-01-29 DIAGNOSIS — M48061 Spinal stenosis, lumbar region without neurogenic claudication: Secondary | ICD-10-CM | POA: Diagnosis not present

## 2022-01-29 DIAGNOSIS — J449 Chronic obstructive pulmonary disease, unspecified: Secondary | ICD-10-CM | POA: Diagnosis not present

## 2022-01-29 DIAGNOSIS — Z602 Problems related to living alone: Secondary | ICD-10-CM | POA: Diagnosis not present

## 2022-01-29 DIAGNOSIS — M5136 Other intervertebral disc degeneration, lumbar region: Secondary | ICD-10-CM | POA: Diagnosis not present

## 2022-01-29 DIAGNOSIS — M5116 Intervertebral disc disorders with radiculopathy, lumbar region: Secondary | ICD-10-CM | POA: Diagnosis not present

## 2022-01-29 DIAGNOSIS — I739 Peripheral vascular disease, unspecified: Secondary | ICD-10-CM | POA: Diagnosis not present

## 2022-02-05 DIAGNOSIS — M48061 Spinal stenosis, lumbar region without neurogenic claudication: Secondary | ICD-10-CM | POA: Diagnosis not present

## 2022-02-05 DIAGNOSIS — J449 Chronic obstructive pulmonary disease, unspecified: Secondary | ICD-10-CM | POA: Diagnosis not present

## 2022-02-05 DIAGNOSIS — M5136 Other intervertebral disc degeneration, lumbar region: Secondary | ICD-10-CM | POA: Diagnosis not present

## 2022-02-05 DIAGNOSIS — M5116 Intervertebral disc disorders with radiculopathy, lumbar region: Secondary | ICD-10-CM | POA: Diagnosis not present

## 2022-02-05 DIAGNOSIS — Z79891 Long term (current) use of opiate analgesic: Secondary | ICD-10-CM | POA: Diagnosis not present

## 2022-02-05 DIAGNOSIS — Z4789 Encounter for other orthopedic aftercare: Secondary | ICD-10-CM | POA: Diagnosis not present

## 2022-02-05 DIAGNOSIS — Z602 Problems related to living alone: Secondary | ICD-10-CM | POA: Diagnosis not present

## 2022-02-05 DIAGNOSIS — I739 Peripheral vascular disease, unspecified: Secondary | ICD-10-CM | POA: Diagnosis not present

## 2022-02-11 ENCOUNTER — Telehealth: Payer: Self-pay

## 2022-02-11 ENCOUNTER — Ambulatory Visit (INDEPENDENT_AMBULATORY_CARE_PROVIDER_SITE_OTHER): Payer: Medicare Other

## 2022-02-11 VITALS — Ht 64.0 in | Wt 138.0 lb

## 2022-02-11 DIAGNOSIS — Z5982 Transportation insecurity: Secondary | ICD-10-CM | POA: Diagnosis not present

## 2022-02-11 DIAGNOSIS — Z0000 Encounter for general adult medical examination without abnormal findings: Secondary | ICD-10-CM | POA: Diagnosis not present

## 2022-02-11 DIAGNOSIS — Z1231 Encounter for screening mammogram for malignant neoplasm of breast: Secondary | ICD-10-CM

## 2022-02-11 NOTE — Patient Instructions (Signed)
[name] , Thank you for taking time to come for your Medicare Wellness Visit. I appreciate your ongoing commitment to your health goals. Please review the following plan we discussed and let me know if I can assist you in the future.   Screening recommendations/referrals: Colonoscopy: 05/01/2020  due 04/2025 Mammogram: referral 0828/2023 Bone Density: not of age  Recommended yearly ophthalmology/optometry visit for glaucoma screening and checkup Recommended yearly dental visit for hygiene and checkup  Vaccinations: Influenza vaccine: completed  Pneumococcal vaccine: completed  Tdap vaccine: 11/26/2018 Shingles vaccine: will consider    Covid-19:completed   Advanced directives: none   Conditions/risks identified: referral SDOH for transpiration needs  Patient states she has difficulty affording medication, food at times , also has transportation issue due death of Friend.  Next appointment: Follow up in one year for your annual wellness visit    Preventive Care 65 Years and Older, Female Preventive care refers to lifestyle choices and visits with your health care provider that can promote health and wellness. What does preventive care include? A yearly physical exam. This is also called an annual well check. Dental exams once or twice a year. Routine eye exams. Ask your health care provider how often you should have your eyes checked. Personal lifestyle choices, including: Daily care of your teeth and gums. Regular physical activity. Eating a healthy diet. Avoiding tobacco and drug use. Limiting alcohol use. Practicing safe sex. Taking low-dose aspirin every day. Taking vitamin and mineral supplements as recommended by your health care provider. What happens during an annual well check? The services and screenings done by your health care provider during your annual well check will depend on your age, overall health, lifestyle risk factors, and family history of  disease. Counseling  Your health care provider may ask you questions about your: Alcohol use. Tobacco use. Drug use. Emotional well-being. Home and relationship well-being. Sexual activity. Eating habits. History of falls. Memory and ability to understand (cognition). Work and work Statistician. Reproductive health. Screening  You may have the following tests or measurements: Height, weight, and BMI. Blood pressure. Lipid and cholesterol levels. These may be checked every 5 years, or more frequently if you are over 13 years old. Skin check. Lung cancer screening. You may have this screening every year starting at age 59 if you have a 30-pack-year history of smoking and currently smoke or have quit within the past 15 years. Fecal occult blood test (FOBT) of the stool. You may have this test every year starting at age 68. Flexible sigmoidoscopy or colonoscopy. You may have a sigmoidoscopy every 5 years or a colonoscopy every 10 years starting at age 95. Hepatitis C blood test. Hepatitis B blood test. Sexually transmitted disease (STD) testing. Diabetes screening. This is done by checking your blood sugar (glucose) after you have not eaten for a while (fasting). You may have this done every 1-3 years. Bone density scan. This is done to screen for osteoporosis. You may have this done starting at age 90. Mammogram. This may be done every 1-2 years. Talk to your health care provider about how often you should have regular mammograms. Talk with your health care provider about your test results, treatment options, and if necessary, the need for more tests. Vaccines  Your health care provider may recommend certain vaccines, such as: Influenza vaccine. This is recommended every year. Tetanus, diphtheria, and acellular pertussis (Tdap, Td) vaccine. You may need a Td booster every 10 years. Zoster vaccine. You may need this after  age 64. Pneumococcal 13-valent conjugate (PCV13) vaccine. One  dose is recommended after age 56. Pneumococcal polysaccharide (PPSV23) vaccine. One dose is recommended after age 8. Talk to your health care provider about which screenings and vaccines you need and how often you need them. This information is not intended to replace advice given to you by your health care provider. Make sure you discuss any questions you have with your health care provider. Document Released: 06/30/2015 Document Revised: 02/21/2016 Document Reviewed: 04/04/2015 Elsevier Interactive Patient Education  2017 [employer] Prevention in the Home Falls can cause injuries. They can happen to people of all ages. There are many things you can do to make your home safe and to help prevent falls. What can I do on the outside of my home? Regularly fix the edges of walkways and driveways and fix any cracks. Remove anything that might make you trip as you walk through a door, such as a raised step or threshold. Trim any bushes or trees on the path to your home. Use bright outdoor lighting. Clear any walking paths of anything that might make someone trip, such as rocks or tools. Regularly check to see if handrails are loose or broken. Make sure that both sides of any steps have handrails. Any raised decks and porches should have guardrails on the edges. Have any leaves, snow, or ice cleared regularly. Use sand or salt on walking paths during winter. Clean up any spills in your garage right away. This includes oil or grease spills. What can I do in the bathroom? Use night lights. Install grab bars by the toilet and in the tub and shower. Do not use towel bars as grab bars. Use non-skid mats or decals in the tub or shower. If you need to sit down in the shower, use a plastic, non-slip stool. Keep the floor dry. Clean up any water that spills on the floor as soon as it happens. Remove soap buildup in the tub or shower regularly. Attach bath mats securely with double-sided  non-slip rug tape. Do not have throw rugs and other things on the floor that can make you trip. What can I do in the bedroom? Use night lights. Make sure that you have a light by your bed that is easy to reach. Do not use any sheets or blankets that are too big for your bed. They should not hang down onto the floor. Have a firm chair that has side arms. You can use this for support while you get dressed. Do not have throw rugs and other things on the floor that can make you trip. What can I do in the kitchen? Clean up any spills right away. Avoid walking on wet floors. Keep items that you use a lot in easy-to-reach places. If you need to reach something above you, use a strong step stool that has a grab bar. Keep electrical cords out of the way. Do not use floor polish or wax that makes floors slippery. If you must use wax, use non-skid floor wax. Do not have throw rugs and other things on the floor that can make you trip. What can I do with my stairs? Do not leave any items on the stairs. Make sure that there are handrails on both sides of the stairs and use them. Fix handrails that are broken or loose. Make sure that handrails are as long as the stairways. Check any carpeting to make sure that it is firmly attached to the stairs.  Fix any carpet that is loose or worn. Avoid having throw rugs at the top or bottom of the stairs. If you do have throw rugs, attach them to the floor with carpet tape. Make sure that you have a light switch at the top of the stairs and the bottom of the stairs. If you do not have them, ask someone to add them for you. What else can I do to help prevent falls? Wear shoes that: Do not have high heels. Have rubber bottoms. Are comfortable and fit you well. Are closed at the toe. Do not wear sandals. If you use a stepladder: Make sure that it is fully opened. Do not climb a closed stepladder. Make sure that both sides of the stepladder are locked into place. Ask  someone to hold it for you, if possible. Clearly mark and make sure that you can see: Any grab bars or handrails. First and last steps. Where the edge of each step is. Use tools that help you move around (mobility aids) if they are needed. These include: Canes. Walkers. Scooters. Crutches. Turn on the lights when you go into a dark area. Replace any light bulbs as soon as they burn out. Set up your furniture so you have a clear path. Avoid moving your furniture around. If any of your floors are uneven, fix them. If there are any pets around you, be aware of where they are. Review your medicines with your doctor. Some medicines can make you feel dizzy. This can increase your chance of falling. Ask your doctor what other things that you can do to help prevent falls. This information is not intended to replace advice given to you by your health care provider. Make sure you discuss any questions you have with your health care provider. Document Released: 03/30/2009 Document Revised: 11/09/2015 Document Reviewed: 07/08/2014 Elsevier Interactive Patient Education  2017 [employer].

## 2022-02-11 NOTE — Telephone Encounter (Signed)
   Telephone encounter was:  Successful.  02/11/2022 Name: [name] MRN: [id number] DOB: 01-23-1956  [name] is a 66 y.o. year old female who is a primary care patient of [name], Lori Oris, [name] . The community resource team was consulted for assistance with Transportation Needs  and [place] guide performed the following interventions: Spoke with patient verified email address sent [place] [employer]. Patient has my name and number to call if she does not receive the email.  Follow Up Plan:  No further follow up planned at this time. The patient has been provided with needed resources.  [name], AAS Paralegal, [employer]  [address], [email or web address] [address] ??[email or web address]  ?? [phone]   [email or web address]

## 2022-02-11 NOTE — Progress Notes (Signed)
Subjective:   [name] is a 66 y.o. female who presents for Medicare Annual (Subsequent) preventive examination.   Virtual Visit via Telephone Note  I connected with  [name] on 02/11/22 at 10:45 AM EDT by telephone and verified that I am speaking with the correct person using two identifiers.  Location: Patient: home  Provider: [facility]  Persons participating in the virtual visit: patient/Nurse Health Advisor   I discussed the limitations, risks, security and privacy concerns of performing an evaluation and management service by telephone and the availability of in person appointments. The patient expressed understanding and agreed to proceed.  Interactive audio and video telecommunications were attempted between this nurse and patient, however failed, due to patient having technical difficulties OR patient did not have access to video capability.  We continued and completed visit with audio only.  Some vital signs may be absent or patient reported.   Lori Shepherd, LPN  Review of Systems     Cardiac Risk Factors include: advanced age (>37mn, >>36women);dyslipidemia     Objective:    Today's Vitals   02/11/22 1056  Weight: 138 lb (62.6 kg)  Height: _0  (1.626 m)   Body mass index is 23.69 kg/m.     02/11/2022   11:13 AM 02/09/2021   11:15 AM 12/30/2019    6:48 AM 12/17/2019    1:20 PM 07/31/2017    6:57 AM 05/10/2013    2:00 PM 05/05/2013    7:33 AM  Advanced Directives  Does Patient Have a Medical Advance Directive? _1  Patient does not have advance directive   Would patient like information on creating a medical advance directive? No - Patient declined No - Patient declined No - Patient declined No - Patient declined     Pre-existing out of facility DNR order (yellow form or pink MOST form)      No No    Current Medications (verified) Outpatient Encounter Medications as of 02/11/2022  Medication Sig   albuterol (PROVENTIL)  (2.5 MG/3ML) 0.083% nebulizer solution    ARIPiprazole (ABILIFY) 10 MG tablet Take 1 tablet (10 mg total) by mouth daily.   aspirin 325 MG tablet Take 325 mg by mouth daily.   atorvastatin (LIPITOR) 40 MG tablet TAKE 1 TABLET BY MOUTH  DAILY   buPROPion (WELLBUTRIN XL) 150 MG 24 hr tablet Take 1 tablet (150 mg total) by mouth daily. With 300 mg=450 mg   buPROPion (WELLBUTRIN XL) 300 MG 24 hr tablet Take 1 tablet (300 mg total) by mouth daily. Take with 150 mg= 450 mg   Cholecalciferol (VITAMIN D) 50 MCG (2000 UT) tablet Take 2,000 Units by mouth daily.   diphenhydramine-acetaminophen (TYLENOL PM) 25-500 MG TABS tablet Take 1 tablet by mouth at bedtime as needed.   FLUCELVAX QUADRIVALENT SUSP injection    lamoTRIgine (LAMICTAL) 100 MG tablet TAKE 1 TABLET BY MOUTH IN  THE MORNING AND 3 TABLETS  BY MOUTH AT BEDTIME   methocarbamol (ROBAXIN) 500 MG tablet 1.5 tablets Orally FOUR TIMES A DAY   naloxone (NARCAN) 4 MG/0.1ML LIQD nasal spray kit Place 0.4 mg into the nose as needed (opioid overdose).    omeprazole (PRILOSEC) 40 MG capsule Take 40 mg by mouth 2 (two) times daily.   Oxycodone HCl 10 MG TABS Take 1 tablet by mouth every 6 (six) hours as needed (pain).    PFIZER COVID-19 VAC BIVALENT injection    potassium chloride (KLOR-CON M) 10  MEQ tablet TAKE 1 TABLET BY MOUTH  DAILY   Probiotic Product (PROBIOTIC DAILY PO) Take by mouth.   promethazine (PHENERGAN) 12.5 MG tablet Take 1 tablet by mouth  every 6 hours as needed for nausea   QUEtiapine (SEROQUEL XR) 400 MG 24 hr tablet Take 1 tablet (400 mg total) by mouth daily. (Patient taking differently: Take 800 mg by mouth daily.)   sucralfate (CARAFATE) 1 g tablet TAKE ONE TABLET BY MOUTH FOUR TIMES DAILY   Suvorexant (BELSOMRA) 20 MG TABS Take 20 mg by mouth at bedtime.   vitamin B-12 (CYANOCOBALAMIN) 1000 MCG tablet Take 1 tablet (1,000 mcg total) by mouth daily.   clindamycin (CLEOCIN) 300 MG capsule Take 1 capsule (300 mg total) by mouth 3  (three) times daily.   famotidine (PEPCID) 20 MG tablet 1 tablet Orally Twice a day   gabapentin (NEURONTIN) 100 MG capsule Take 1-3 pills at bedtime as needed for nerve pain   Plecanatide (TRULANCE) 3 MG TABS Take by mouth.   No facility-administered encounter medications on file as of 02/11/2022.    Allergies (verified) Ambien [zolpidem tartrate], Hydrocodone, Lactose intolerance (gi), Nsaids, Propoxyphene n-acetaminophen, Tramadol, and Chantix [varenicline]   History: Past Medical History:  Diagnosis Date   Allergic rhinitis    Arthritis    Brain aneurysm    Chronic abdominal pain    COPD (chronic obstructive pulmonary disease) (HCC)    denies   Depression    bipolar   Fibromyalgia    Gastroparesis    GERD (gastroesophageal reflux disease)    H/O hiatal hernia    History of colonic polyps    HLD (hyperlipidemia)    IBS (irritable bowel syndrome)    chronic constipation   Migraines    OCD (obsessive compulsive disorder)    anxiety   Peripheral neuropathy    Past Surgical History:  Procedure Laterality Date   ABDOMINAL HYSTERECTOMY     APPENDECTOMY     CERVICAL DISC SURGERY     cholecystectomy     coil to brain aneurysm     COLONOSCOPY     ELBOW SURGERY Bilateral    tendonitis   IR ANGIO INTRA EXTRACRAN SEL COM CAROTID INNOMINATE BILAT MOD SED  07/31/2017   IR ANGIO INTRA EXTRACRAN SEL COM CAROTID INNOMINATE BILAT MOD SED  12/30/2019   IR ANGIO VERTEBRAL SEL VERTEBRAL BILAT MOD SED  07/31/2017   IR ANGIO VERTEBRAL SEL VERTEBRAL BILAT MOD SED  12/30/2019   NECK SURGERY     OOPHORECTOMY     RADIOLOGY WITH ANESTHESIA N/A 05/10/2013   Procedure: RADIOLOGY WITH ANESTHESIA;  Surgeon: Rob Hickman, MD;  Location: [facility];  Service: Radiology;  Laterality: N/A;   SHOULDER SURGERY Left    TUBAL LIGATION     VESICOVAGINAL FISTULA CLOSURE W/ TAH     Family History  Problem Relation Age of Onset   Cancer Father        lung   Cancer Mother        brain   Coronary  artery disease Other        family hx   Cancer Other        family hx   Diabetes Other        DM - family hx   Hypertension Other        family hx   Seizures Other        family hx   Cancer Sister        stomach  Leukemia Sister    Colon cancer Neg Hx    Social History   Socioeconomic History   Marital status: Widowed    Spouse name: Not on file   Number of children: Not on file   Years of education: Not on file   Highest education level: Not on file  Occupational History    Employer: DISABLED  Tobacco Use   Smoking status: Former    Packs/day: 0.10    Years: 26.00    Total pack years: 2.60    Types: Cigarettes, E-cigarettes   Smokeless tobacco: Never  Vaping Use   Vaping Use: Never used  Substance and Sexual Activity   Alcohol use: No   Drug use: No   Sexual activity: Not Currently  Other Topics Concern   Not on file  Social History Narrative   Widow. Does not work. Drinks coffee in AM    Social Determinants of Health   Financial Resource Strain: Medium Risk (02/11/2022)   Overall Financial Resource Strain (CARDIA)    Difficulty of Paying Living Expenses: Somewhat hard  Food Insecurity: Food Insecurity Present (02/11/2022)   Hunger Vital Sign    Worried About Running Out of Food in the Last Year: Sometimes true    Ran Out of Food in the Last Year: Sometimes true  Transportation Needs: Unmet Transportation Needs (02/11/2022)   PRAPARE - Hydrologist (Medical): Yes    Lack of Transportation (Non-Medical): No  Physical Activity: Inactive (02/11/2022)   Exercise Vital Sign    Days of Exercise per Week: 0 days    Minutes of Exercise per Session: 0 min  Stress: No Stress Concern Present (02/11/2022)   [place]    Feeling of Stress : Only a little  Social Connections: Moderately Integrated (02/09/2021)   Social Connection and Isolation Panel [NHANES]    Frequency of  Communication with Friends and Family: More than three times a week    Frequency of Social Gatherings with Friends and Family: Three times a week    Attends Religious Services: More than 4 times per year    Active Member of Clubs or Organizations: No    Attends Archivist Meetings: More than 4 times per year    Marital Status: Widowed    Tobacco Counseling Counseling given: Not Answered   Clinical Intake:  Pre-visit preparation completed: Yes  Pain : No/denies pain     Diabetes: No  How often do you need to have someone help you when you read instructions, pamphlets, or other written materials from your doctor or pharmacy?: 1 - Never What is the last grade level you completed in school?: [place]   Interpreter Needed?: No  Information entered by :: L.Wilson,LPN   Activities of Daily Living    02/11/2022   11:16 AM  In your present state of health, do you have any difficulty performing the following activities:  Hearing? 0  Vision? 0  Difficulty concentrating or making decisions? 0  Walking or climbing stairs? 0  Dressing or bathing? 0  Doing errands, shopping? 0  Preparing Food and eating ? N  Using the Toilet? N  In the past six months, have you accidently leaked urine? N  Do you have problems with loss of bowel control? N  Managing your Medications? N  Managing your Finances? Y  Housekeeping or managing your Housekeeping? N    Patient Care Team: Biagio Borg,  MD as PCP - General  Indicate any recent Medical Services you may have received from other than [facility] providers in the past year (date may be approximate).     Assessment:   This is a routine wellness examination for [place].  Hearing/Vision screen Vision Screening - Comments:: Annual eye exams wear glasses   Dietary issues and exercise activities discussed: Current Exercise Habits: The patient does not participate in regular exercise at present   Goals Addressed                This Visit's Progress     Cut back on smoking (pt-stated)   On track      Depression Screen    02/11/2022   11:27 AM 02/11/2022   11:09 AM 10/17/2021    3:29 PM 07/16/2021    3:31 PM 02/09/2021   11:13 AM 08/08/2020    3:26 PM 08/08/2020    2:38 PM  PHQ 2/9 Scores  PHQ - 2 Score 0 _0 0 1 0  PHQ- 9 Score   13    0    Fall Risk    02/11/2022   10:58 AM 10/17/2021    3:29 PM 07/16/2021    3:31 PM 02/09/2021   11:16 AM 08/08/2020    3:26 PM  [place] in the past year? 0 _1 0  Number falls in past yr: 0 _2 Injury with Fall? 0 1 0 0   Risk for fall due to : History of fall(s)   Medication side effect;Orthopedic patient   Risk for fall due to: Comment uses walker / Cane      Follow up Falls evaluation completed;Education provided   Falls evaluation completed     [place]:  Any stairs in or around the home? No  If so, are there any without handrails? No  Home free of loose throw rugs in walkways, pet beds, electrical cords, etc? Yes  Adequate lighting in your home to reduce risk of falls? Yes   ASSISTIVE DEVICES UTILIZED TO PREVENT FALLS:  Life alert? No  Use of a cane, walker or w/c? Yes  Grab bars in the bathroom? Yes  Shower chair or bench in shower? Yes  Elevated toilet seat or a handicapped toilet? Yes          02/11/2022   11:26 AM 02/11/2022   11:16 AM  6CIT Screen  What Year? 0 points 0 points  What month? 0 points 0 points  What time? 0 points 0 points  Count back from 20 0 points 0 points  Months in reverse 2 points 0 points  Repeat phrase 2 points 0 points  Total Score 4 points 0 points    Immunizations Immunization History  Administered Date(s) Administered   H1N1 07/11/2008   Influenza Split 02/20/2012, 02/27/2014   Influenza Whole 05/21/2007, 04/15/2008, 03/16/2009, 02/21/2010   Influenza,inj,Quad PF,6+ Mos 03/31/2015, 03/01/2016   Influenza,inj,quad, With Preservative 03/21/2018,  03/05/2019   Influenza-Unspecified 01/15/2014, 04/17/2020   Moderna Sars-Covid-2 Vaccination 10/05/2019, 11/04/2019, 05/17/2020   Pneumococcal Conjugate-13 03/19/2016   Pneumococcal Polysaccharide-23 05/01/2015, 08/08/2020   Td 01/18/2008   Tdap 11/26/2018    TDAP status: Up to date  Flu Vaccine status: Up to date  Pneumococcal vaccine status: Up to date  Covid-19 vaccine status: Completed vaccines  Qualifies for Shingles Vaccine? Yes   Zostavax completed Yes   Shingrix Completed?: Yes  Screening Tests Health  Maintenance  Topic Date Due   Zoster Vaccines- Shingrix (1 of 2) Never done   COVID-19 Vaccine (4 - Moderna risk series) 07/12/2020   INFLUENZA VACCINE  01/15/2022   MAMMOGRAM  10/18/2022 (Originally 10/17/2020)   COLONOSCOPY (Pts 45-22yr Insurance coverage will need to be confirmed)  05/01/2025   TETANUS/TDAP  11/25/2028   Hepatitis C Screening  Completed   HIV Screening  Completed   HPV VACCINES  Aged Out    Health Maintenance  Health Maintenance Due  Topic Date Due   Zoster Vaccines- Shingrix (1 of 2) Never done   COVID-19 Vaccine (4 - Moderna risk series) 07/12/2020   INFLUENZA VACCINE  01/15/2022    Colorectal cancer screening: Type of screening: Colonoscopy. Completed 05/01/2020. Repeat every 5 years  Mammogram status: Ordered 02/11/2022. Pt provided with contact info and advised to call to schedule appt.   Bone Density status: Ordered Not of Age . Pt provided with contact info and advised to call to schedule appt.  Lung Cancer Screening: (Low Dose CT Chest recommended if Age 64-80years, 30 pack-year currently smoking OR have quit w/in 15years.) does not qualify.   Lung Cancer Screening Referral: n/a  Additional Screening:  Hepatitis C Screening: does not qualify;   Vision Screening: Recommended annual ophthalmology exams for early detection of glaucoma and other disorders of the eye. Is the patient up to date with their annual eye exam?  Yes   Who is the provider or what is the name of the office in which the patient attends annual eye exams? Dr.Murdox  If pt is not established with a provider, would they like to be referred to a provider to establish care? No .   Dental Screening: Recommended annual dental exams for proper oral hygiene  Community Resource Referral / Chronic Care Management: CRR required this visit?  No   CCM required this visit?  No      Plan:     I have personally reviewed and noted the following in the patient's chart:   Medical and social history Use of alcohol, tobacco or illicit drugs  Current medications and supplements including opioid prescriptions. Patient is not currently taking opioid prescriptions. Functional ability and status Nutritional status Physical activity Advanced directives List of other physicians Hospitalizations, surgeries, and ER visits in previous 12 months Vitals Screenings to include cognitive, depression, and falls Referrals and appointments  In addition, I have reviewed and discussed with patient certain preventive protocols, quality metrics, and best practice recommendations. A written personalized care plan for preventive services as well as general preventive health recommendations were provided to patient.     LDaphane Shepherd LPN   [id number]  Nurse Notes: none

## 2022-02-11 NOTE — Telephone Encounter (Signed)
NOTE NOT NEEDED ?

## 2022-02-19 DIAGNOSIS — J449 Chronic obstructive pulmonary disease, unspecified: Secondary | ICD-10-CM | POA: Diagnosis not present

## 2022-02-19 DIAGNOSIS — Z602 Problems related to living alone: Secondary | ICD-10-CM | POA: Diagnosis not present

## 2022-02-19 DIAGNOSIS — M5136 Other intervertebral disc degeneration, lumbar region: Secondary | ICD-10-CM | POA: Diagnosis not present

## 2022-02-19 DIAGNOSIS — I739 Peripheral vascular disease, unspecified: Secondary | ICD-10-CM | POA: Diagnosis not present

## 2022-02-19 DIAGNOSIS — M5116 Intervertebral disc disorders with radiculopathy, lumbar region: Secondary | ICD-10-CM | POA: Diagnosis not present

## 2022-02-19 DIAGNOSIS — M48061 Spinal stenosis, lumbar region without neurogenic claudication: Secondary | ICD-10-CM | POA: Diagnosis not present

## 2022-02-19 DIAGNOSIS — Z4789 Encounter for other orthopedic aftercare: Secondary | ICD-10-CM | POA: Diagnosis not present

## 2022-02-19 DIAGNOSIS — Z79891 Long term (current) use of opiate analgesic: Secondary | ICD-10-CM | POA: Diagnosis not present

## 2022-02-26 DIAGNOSIS — Z602 Problems related to living alone: Secondary | ICD-10-CM | POA: Diagnosis not present

## 2022-02-26 DIAGNOSIS — Z4789 Encounter for other orthopedic aftercare: Secondary | ICD-10-CM | POA: Diagnosis not present

## 2022-02-26 DIAGNOSIS — M48061 Spinal stenosis, lumbar region without neurogenic claudication: Secondary | ICD-10-CM | POA: Diagnosis not present

## 2022-02-26 DIAGNOSIS — M5116 Intervertebral disc disorders with radiculopathy, lumbar region: Secondary | ICD-10-CM | POA: Diagnosis not present

## 2022-02-26 DIAGNOSIS — M5136 Other intervertebral disc degeneration, lumbar region: Secondary | ICD-10-CM | POA: Diagnosis not present

## 2022-02-26 DIAGNOSIS — J449 Chronic obstructive pulmonary disease, unspecified: Secondary | ICD-10-CM | POA: Diagnosis not present

## 2022-02-26 DIAGNOSIS — I739 Peripheral vascular disease, unspecified: Secondary | ICD-10-CM | POA: Diagnosis not present

## 2022-02-26 DIAGNOSIS — Z79891 Long term (current) use of opiate analgesic: Secondary | ICD-10-CM | POA: Diagnosis not present

## 2022-02-27 ENCOUNTER — Other Ambulatory Visit: Payer: Self-pay | Admitting: Internal Medicine

## 2022-02-27 NOTE — Telephone Encounter (Signed)
Please refill as per office routine med refill policy (all routine meds to be refilled for 3 mo or monthly (per pt preference) up to one year from last visit, then month to month grace period for 3 mo, then further med refills will have to be denied) ? ?

## 2022-03-05 DIAGNOSIS — M5136 Other intervertebral disc degeneration, lumbar region: Secondary | ICD-10-CM | POA: Diagnosis not present

## 2022-03-05 DIAGNOSIS — Z602 Problems related to living alone: Secondary | ICD-10-CM | POA: Diagnosis not present

## 2022-03-05 DIAGNOSIS — Z4789 Encounter for other orthopedic aftercare: Secondary | ICD-10-CM | POA: Diagnosis not present

## 2022-03-05 DIAGNOSIS — M48061 Spinal stenosis, lumbar region without neurogenic claudication: Secondary | ICD-10-CM | POA: Diagnosis not present

## 2022-03-05 DIAGNOSIS — Z79891 Long term (current) use of opiate analgesic: Secondary | ICD-10-CM | POA: Diagnosis not present

## 2022-03-05 DIAGNOSIS — J449 Chronic obstructive pulmonary disease, unspecified: Secondary | ICD-10-CM | POA: Diagnosis not present

## 2022-03-05 DIAGNOSIS — M5116 Intervertebral disc disorders with radiculopathy, lumbar region: Secondary | ICD-10-CM | POA: Diagnosis not present

## 2022-03-05 DIAGNOSIS — I739 Peripheral vascular disease, unspecified: Secondary | ICD-10-CM | POA: Diagnosis not present

## 2022-03-07 ENCOUNTER — Ambulatory Visit (INDEPENDENT_AMBULATORY_CARE_PROVIDER_SITE_OTHER): Payer: Medicare Other

## 2022-03-07 DIAGNOSIS — Z1231 Encounter for screening mammogram for malignant neoplasm of breast: Secondary | ICD-10-CM | POA: Diagnosis not present

## 2022-03-12 ENCOUNTER — Ambulatory Visit: Payer: Medicare Other | Admitting: Physician Assistant

## 2022-03-18 DIAGNOSIS — M25552 Pain in left hip: Secondary | ICD-10-CM | POA: Diagnosis not present

## 2022-03-18 DIAGNOSIS — M48061 Spinal stenosis, lumbar region without neurogenic claudication: Secondary | ICD-10-CM | POA: Diagnosis not present

## 2022-03-18 DIAGNOSIS — M5416 Radiculopathy, lumbar region: Secondary | ICD-10-CM | POA: Diagnosis not present

## 2022-03-21 ENCOUNTER — Encounter: Payer: Self-pay | Admitting: Physician Assistant

## 2022-03-21 ENCOUNTER — Ambulatory Visit (INDEPENDENT_AMBULATORY_CARE_PROVIDER_SITE_OTHER): Payer: Medicare Other | Admitting: Physician Assistant

## 2022-03-21 DIAGNOSIS — F259 Schizoaffective disorder, unspecified: Secondary | ICD-10-CM | POA: Diagnosis not present

## 2022-03-21 DIAGNOSIS — F5105 Insomnia due to other mental disorder: Secondary | ICD-10-CM | POA: Diagnosis not present

## 2022-03-21 DIAGNOSIS — F17200 Nicotine dependence, unspecified, uncomplicated: Secondary | ICD-10-CM | POA: Diagnosis not present

## 2022-03-21 DIAGNOSIS — F99 Mental disorder, not otherwise specified: Secondary | ICD-10-CM

## 2022-03-21 DIAGNOSIS — F411 Generalized anxiety disorder: Secondary | ICD-10-CM | POA: Diagnosis not present

## 2022-03-21 NOTE — Progress Notes (Signed)
[facility] Med Check  Patient ID: [name],  MRN: [id number]  PCP: Biagio Borg, MD  Date of Evaluation: 03/21/2022 Time spent:20 minutes  Chief Complaint:  Chief Complaint   Anxiety; Depression; Insomnia; Follow-up    HISTORY/CURRENT STATUS: HPI For routine med check.   Belsomra was added at [facility]. Might be helping a little bit. Doesn't take naps, tries to be as active as she can during the day.  She has had some nausea and vomiting over the past few weeks.  Not sure if it is related to the [place] or not but that has been the only thing that is different in the past month.  Her back and hip still cause a lot of pain and she can't do a whole lot. She walks with a cane.   Patient is able to enjoy things.   No extreme sadness, tearfulness, or feelings of hopelessness.  ADLs and personal hygiene are normal.   Denies any changes in concentration, making decisions, or remembering things.  Appetite has not changed.  Weight is stable.  Denies suicidal or homicidal thoughts.  Patient denies increased energy with decreased need for sleep, increased talkativeness, racing thoughts, impulsivity or risky behaviors, increased spending, increased libido, grandiosity, increased irritability or anger, paranoia, or hallucinations.  Bilateral hand tremor is still present but not severe.  Does not prevent eating or writing.  Denies dizziness, syncope, seizures, numbness, tingling, tics, slurred speech, confusion. Chronic low back and bilat hip pain.   Individual Medical History/ Review of Systems: Changes? :No       Past medications for mental health diagnoses include: Ingrezza was ineffective, Lamictal, Risperdal, Zyprexa, Cogentin, Latuda, Cymbalta, lithium, Ambien, prazosin, Ativan, Wellbutrin, Zoloft, and probably others, trazodone possibly caused double vision and a fall. Seroquel, Gabapentin didn't help anxiety, Mirtazepine caused dizziness  Allergies: Ambien [zolpidem tartrate],  Hydrocodone, Lactose intolerance (gi), Nsaids, Propoxyphene n-acetaminophen, Tramadol, and Chantix [varenicline]  Current Medications:  Current Outpatient Medications:    albuterol (PROVENTIL) (2.5 MG/3ML) 0.083% nebulizer solution, , Disp: , Rfl:    ARIPiprazole (ABILIFY) 10 MG tablet, Take 1 tablet (10 mg total) by mouth daily., Disp: 90 tablet, Rfl: 0   aspirin 325 MG tablet, Take 325 mg by mouth daily., Disp: , Rfl:    atorvastatin (LIPITOR) 40 MG tablet, TAKE 1 TABLET BY MOUTH  DAILY, Disp: 90 tablet, Rfl: 3   buPROPion (WELLBUTRIN XL) 150 MG 24 hr tablet, Take 1 tablet (150 mg total) by mouth daily. With 300 mg=450 mg, Disp: 90 tablet, Rfl: 3   buPROPion (WELLBUTRIN XL) 300 MG 24 hr tablet, Take 1 tablet (300 mg total) by mouth daily. Take with 150 mg= 450 mg, Disp: 90 tablet, Rfl: 3   Cholecalciferol (VITAMIN D) 50 MCG (2000 UT) tablet, Take 2,000 Units by mouth daily., Disp: , Rfl:    lamoTRIgine (LAMICTAL) 100 MG tablet, TAKE 1 TABLET BY MOUTH IN  THE MORNING AND 3 TABLETS  BY MOUTH AT BEDTIME, Disp: 360 tablet, Rfl: 3   methocarbamol (ROBAXIN) 500 MG tablet, 1.5 tablets Orally FOUR TIMES A DAY, Disp: , Rfl:    omeprazole (PRILOSEC) 40 MG capsule, Take 40 mg by mouth 2 (two) times daily., Disp: , Rfl:    Oxycodone HCl 10 MG TABS, Take 1 tablet by mouth every 6 (six) hours as needed (pain). , Disp: , Rfl:    PFIZER COVID-19 VAC BIVALENT injection, , Disp: , Rfl:    potassium chloride (KLOR-CON M) 10 MEQ tablet, TAKE 1 TABLET BY  MOUTH  DAILY, Disp: 90 tablet, Rfl: 2   Probiotic Product (PROBIOTIC DAILY PO), Take by mouth., Disp: , Rfl:    promethazine (PHENERGAN) 12.5 MG tablet, Take 1 tablet by mouth  every 6 hours as needed for nausea, Disp: 90 tablet, Rfl: 0   QUEtiapine (SEROQUEL XR) 400 MG 24 hr tablet, Take 1 tablet (400 mg total) by mouth daily. (Patient taking differently: Take 800 mg by mouth daily.), Disp: 90 tablet, Rfl: 3   sucralfate (CARAFATE) 1 g tablet, TAKE ONE TABLET BY  MOUTH FOUR TIMES DAILY, Disp: 120 tablet, Rfl: 0   vitamin B-12 (CYANOCOBALAMIN) 1000 MCG tablet, Take 1 tablet (1,000 mcg total) by mouth daily., Disp: 90 tablet, Rfl: 3   clindamycin (CLEOCIN) 300 MG capsule, Take 1 capsule (300 mg total) by mouth 3 (three) times daily., Disp: 30 capsule, Rfl: 0   diphenhydramine-acetaminophen (TYLENOL PM) 25-500 MG TABS tablet, Take 1 tablet by mouth at bedtime as needed. (Patient not taking: Reported on 03/21/2022), Disp: , Rfl:    famotidine (PEPCID) 20 MG tablet, 1 tablet Orally Twice a day, Disp: , Rfl:    FLUCELVAX QUADRIVALENT SUSP injection, , Disp: , Rfl:    gabapentin (NEURONTIN) 100 MG capsule, Take 1-3 pills at bedtime as needed for nerve pain, Disp: 30 capsule, Rfl: 3   naloxone (NARCAN) 4 MG/0.1ML LIQD nasal spray kit, Place 0.4 mg into the nose as needed (opioid overdose).  (Patient not taking: Reported on 03/21/2022), Disp: , Rfl:    Plecanatide (TRULANCE) 3 MG TABS, Take by mouth., Disp: , Rfl:  Medication Side Effects: none   Family Medical/ Social History: Changes? no  MENTAL HEALTH EXAM:  There were no vitals taken for this visit.There is no height or weight on file to calculate BMI.  General Appearance: Casual, Well Groomed, and edentulous  Eye Contact:  Good  Speech:  Clear and Coherent and Normal Rate  Volume:  Normal  Mood:  Euthymic  Affect:  Congruent  Thought Process:  Goal Directed and Descriptions of Associations: Circumstantial  Orientation:  Full (Time, Place, and Person)  Thought Content: Logical   Suicidal Thoughts:  No  Homicidal Thoughts:  No  Memory:  Recent;   Fair Remote;   Good  Judgement:  Good  Insight:  Good  Psychomotor Activity:   walks slowly using a cane  Concentration:  Concentration: Good and Attention Span: Fair  Recall:  Good  Fund of Knowledge: Good  Language: Good  Assets:  Desire for Improvement  ADL's:  Intact  Cognition: WNL  Prognosis:  Good   DIAGNOSES:    ICD-10-CM   1.  Schizoaffective disorder, unspecified type ([place])  F25.9     2. Insomnia due to other mental disorder  F51.05    F99     3. Generalized anxiety disorder  F41.1     4. Smoker  F17.200       Receiving Psychotherapy: No   RECOMMENDATIONS:  PDMP reviewed.  Belsomra prescribed 03/06/2022.  Oxycodone is known to me.  I provided 20 minutes of face to face time during this encounter, including time spent before and after the visit in records review, medical decision making, counseling pertinent to today's visit, and charting.   We discussed the insomnia.  She states she can live with it, that she is getting enough sleep to function although she would like to have more.  We decided to stop the Belsomra, see if that will help with the nausea and vomiting.  If  it does not then I recommend she see her PCP.  We also discussed possibly trying Dayvigo or Quviviq, if after going off the Belsomra and the nausea and vomiting has resolved, she would like to try 1 of those medication she can call and I will send in the prescription.  She verbalizes understanding.  Continue Abilify 10 mg, 1 p.o. every morning. Continue Wellbutrin XL 450 mg p.o. daily.  Continue Lamictal 100 mg, 1 p.o. every morning and 3 p.o. nightly. Continue Seroquel XR 400 mg, 2 p.o. nightly. Recommend therapy. Return in 3 months.  Donnal Moat, PA-C

## 2022-03-25 DIAGNOSIS — Z86010 Personal history of colonic polyps: Secondary | ICD-10-CM | POA: Diagnosis not present

## 2022-03-25 DIAGNOSIS — K5903 Drug induced constipation: Secondary | ICD-10-CM | POA: Diagnosis not present

## 2022-03-25 DIAGNOSIS — K219 Gastro-esophageal reflux disease without esophagitis: Secondary | ICD-10-CM | POA: Diagnosis not present

## 2022-03-25 DIAGNOSIS — R1031 Right lower quadrant pain: Secondary | ICD-10-CM | POA: Diagnosis not present

## 2022-03-25 DIAGNOSIS — R112 Nausea with vomiting, unspecified: Secondary | ICD-10-CM | POA: Diagnosis not present

## 2022-03-26 ENCOUNTER — Other Ambulatory Visit: Payer: Self-pay | Admitting: Physician Assistant

## 2022-04-03 DIAGNOSIS — M542 Cervicalgia: Secondary | ICD-10-CM | POA: Diagnosis not present

## 2022-04-03 DIAGNOSIS — M961 Postlaminectomy syndrome, not elsewhere classified: Secondary | ICD-10-CM | POA: Diagnosis not present

## 2022-04-03 DIAGNOSIS — M4726 Other spondylosis with radiculopathy, lumbar region: Secondary | ICD-10-CM | POA: Diagnosis not present

## 2022-04-03 DIAGNOSIS — Z79891 Long term (current) use of opiate analgesic: Secondary | ICD-10-CM | POA: Diagnosis not present

## 2022-04-03 DIAGNOSIS — M5136 Other intervertebral disc degeneration, lumbar region: Secondary | ICD-10-CM | POA: Diagnosis not present

## 2022-04-11 ENCOUNTER — Other Ambulatory Visit: Payer: Self-pay | Admitting: Physician Assistant

## 2022-04-11 ENCOUNTER — Telehealth: Payer: Self-pay | Admitting: Physician Assistant

## 2022-04-11 MED ORDER — DAYVIGO 10 MG PO TABS
10.0000 mg | ORAL_TABLET | Freq: Every day | ORAL | 0 refills | Status: DC
Start: 2022-04-11 — End: 2022-08-06

## 2022-04-11 NOTE — Telephone Encounter (Signed)
Pt called and said that she has gotten the buspar out of her system. She is know ready to try something else for sleep. Please call her at [phone]

## 2022-04-11 NOTE — Telephone Encounter (Signed)
I'll send in [place]. Need pharmacy please. Thanks. (For the record, she meant belsomra, not buspar)

## 2022-04-11 NOTE — Telephone Encounter (Signed)
Please advise 

## 2022-04-11 NOTE — Telephone Encounter (Signed)
[name], [name]

## 2022-04-11 NOTE — Telephone Encounter (Signed)
Prescription sent

## 2022-04-17 ENCOUNTER — Ambulatory Visit (INDEPENDENT_AMBULATORY_CARE_PROVIDER_SITE_OTHER): Payer: Medicare Other | Admitting: Internal Medicine

## 2022-04-17 VITALS — BP 120/74 | HR 74 | Temp 98.2°F | Ht 64.0 in | Wt 138.0 lb

## 2022-04-17 DIAGNOSIS — R739 Hyperglycemia, unspecified: Secondary | ICD-10-CM

## 2022-04-17 DIAGNOSIS — E7800 Pure hypercholesterolemia, unspecified: Secondary | ICD-10-CM | POA: Diagnosis not present

## 2022-04-17 DIAGNOSIS — E559 Vitamin D deficiency, unspecified: Secondary | ICD-10-CM | POA: Diagnosis not present

## 2022-04-17 DIAGNOSIS — J438 Other emphysema: Secondary | ICD-10-CM

## 2022-04-17 NOTE — Assessment & Plan Note (Addendum)
Stable overall, pt to continue albuterol inhaler prn

## 2022-04-17 NOTE — Assessment & Plan Note (Signed)
Last vitamin D Lab Results  Component Value Date   VD25OH 44.86 07/16/2021   Stable, cont oral replacement

## 2022-04-17 NOTE — Assessment & Plan Note (Signed)
Lab Results  Component Value Date   HGBA1C 4.8 07/16/2021   Stable, pt to continue current medical treatment  - diet, wt control, excercise

## 2022-04-17 NOTE — Patient Instructions (Signed)
You had the Shingles shot #2 today  Please continue all other medications as before, and refills have been done if requested.  Please have the pharmacy call with any other refills you may need.  Please continue your efforts at being more active, low cholesterol diet, and weight control.  You are otherwise up to date with prevention measures today.  Please keep your appointments with your specialists as you may have planned  We can hold on more lab testing today  Please make an Appointment to return in 6 months, or sooner if needed

## 2022-04-17 NOTE — Assessment & Plan Note (Signed)
Lab Results  Component Value Date   LDLCALC 59 07/16/2021   Stable, pt to continue current statin lipitor 40 mg qd

## 2022-04-17 NOTE — Progress Notes (Signed)
Patient ID: [name], female   DOB: [birth year], 66 y.o.   MRN: [id number]        Chief Complaint: follow up with friend for support general medical, copd, hypergylcemia, hld       HPI:  [name] is a 66 y.o. female here as above, left thumb infection may 2023 resolved with antbx, no recurrence.  Pt denies chest pain, increased sob or doe, wheezing, orthopnea, PND, increased LE swelling, palpitations, dizziness or syncope   Pt denies polydipsia, polyuria, or new focal neuro s/s.    Pt denies fever, wt loss, night sweats, loss of appetite, or other constitutional symptoms  Denies worsening depressive symptoms, suicidal ideation, or panic; has ongoing anxiety, and follow with psychiatry.         Wt Readings from Last 3 Encounters:  04/17/22 138 lb (62.6 kg)  02/11/22 138 lb (62.6 kg)  10/17/21 131 lb (59.4 kg)   BP Readings from Last 3 Encounters:  04/17/22 120/74  10/17/21 128/76  08/14/21 118/80         Past Medical History:  Diagnosis Date   Allergic rhinitis    Arthritis    Brain aneurysm    Chronic abdominal pain    COPD (chronic obstructive pulmonary disease) (HCC)    denies   Depression    bipolar   Fibromyalgia    Gastroparesis    GERD (gastroesophageal reflux disease)    H/O hiatal hernia    History of colonic polyps    HLD (hyperlipidemia)    IBS (irritable bowel syndrome)    chronic constipation   Migraines    OCD (obsessive compulsive disorder)    anxiety   Peripheral neuropathy    Past Surgical History:  Procedure Laterality Date   ABDOMINAL HYSTERECTOMY     APPENDECTOMY     CERVICAL DISC SURGERY     cholecystectomy     coil to brain aneurysm     COLONOSCOPY     ELBOW SURGERY Bilateral    tendonitis   IR ANGIO INTRA EXTRACRAN SEL COM CAROTID INNOMINATE BILAT MOD SED  07/31/2017   IR ANGIO INTRA EXTRACRAN SEL COM CAROTID INNOMINATE BILAT MOD SED  12/30/2019   IR ANGIO VERTEBRAL SEL VERTEBRAL BILAT MOD SED  07/31/2017   IR ANGIO VERTEBRAL SEL  VERTEBRAL BILAT MOD SED  12/30/2019   NECK SURGERY     OOPHORECTOMY     RADIOLOGY WITH ANESTHESIA N/A 05/10/2013   Procedure: RADIOLOGY WITH ANESTHESIA;  Surgeon: Rob Hickman, MD;  Location: [facility];  Service: Radiology;  Laterality: N/A;   SHOULDER SURGERY Left    TUBAL LIGATION     VESICOVAGINAL FISTULA CLOSURE W/ TAH      reports that she has quit smoking. Her smoking use included cigarettes and e-cigarettes. She has a 2.60 pack-year smoking history. She has never used smokeless tobacco. She reports that she does not drink alcohol and does not use drugs. family history includes Cancer in her father, mother, sister, and another family member; Coronary artery disease in an other family member; Diabetes in an other family member; Hypertension in an other family member; Leukemia in her sister; Seizures in an other family member. Allergies  Allergen Reactions   Ambien [Zolpidem Tartrate] Other (See Comments)    Memory issues   Anesthetics, Halogenated Nausea Only   Hydrocodone Nausea Only    Can take it if its in a cough syrup    Lactose Intolerance (Gi)     Bloating, upset  stomach    Nsaids     GI irritation, pt tolerates meloxicam and diclofenac    Propoxyphene N-Acetaminophen Itching   Tramadol Nausea And Vomiting   Chantix [Varenicline] Other (See Comments)    "Makes bipolar worse" agitation, depression, sick to stomach   Current Outpatient Medications on File Prior to Visit  Medication Sig Dispense Refill   albuterol (PROVENTIL) (2.5 MG/3ML) 0.083% nebulizer solution      ARIPiprazole (ABILIFY) 10 MG tablet Take 1 tablet (10 mg total) by mouth daily. 90 tablet 0   aspirin 325 MG tablet Take 325 mg by mouth daily.     atorvastatin (LIPITOR) 40 MG tablet TAKE 1 TABLET BY MOUTH  DAILY 90 tablet 3   buPROPion (WELLBUTRIN XL) 150 MG 24 hr tablet Take 1 tablet (150 mg total) by mouth daily. With 300 mg=450 mg 90 tablet 3   buPROPion (WELLBUTRIN XL) 300 MG 24 hr tablet Take 1  tablet (300 mg total) by mouth daily. Take with 150 mg= 450 mg 90 tablet 3   Cholecalciferol (VITAMIN D) 50 MCG (2000 UT) tablet Take 2,000 Units by mouth daily.     FLUCELVAX QUADRIVALENT SUSP injection      ibuprofen (ADVIL) 200 MG tablet Take by mouth.     lamoTRIgine (LAMICTAL) 100 MG tablet TAKE 1 TABLET BY MOUTH IN  THE MORNING AND 3 TABLETS  BY MOUTH AT BEDTIME 360 tablet 3   Lemborexant (DAYVIGO) 10 MG TABS Take 10 mg by mouth at bedtime. 30 tablet 0   methocarbamol (ROBAXIN) 500 MG tablet 1.5 tablets Orally FOUR TIMES A DAY     omeprazole (PRILOSEC) 40 MG capsule Take 40 mg by mouth 2 (two) times daily.     Oxycodone HCl 10 MG TABS Take 1 tablet by mouth every 6 (six) hours as needed (pain).      PFIZER COVID-19 VAC BIVALENT injection      potassium chloride (KLOR-CON M) 10 MEQ tablet TAKE 1 TABLET BY MOUTH  DAILY 90 tablet 2   Probiotic Product (PROBIOTIC DAILY PO) Take by mouth.     promethazine (PHENERGAN) 12.5 MG tablet Take 1 tablet by mouth  every 6 hours as needed for nausea 90 tablet 0   QUEtiapine (SEROQUEL XR) 400 MG 24 hr tablet Take 1 tablet (400 mg total) by mouth daily. (Patient taking differently: Take 800 mg by mouth daily.) 90 tablet 3   sucralfate (CARAFATE) 1 g tablet TAKE ONE TABLET BY MOUTH FOUR TIMES DAILY 120 tablet 0   vitamin B-12 (CYANOCOBALAMIN) 1000 MCG tablet Take 1 tablet (1,000 mcg total) by mouth daily. 90 tablet 3   bisacodyl (DULCOLAX) 5 MG EC tablet Take by mouth.     cetirizine (ZYRTEC) 10 MG tablet Take by mouth.     diphenhydrAMINE (BENADRYL) 25 MG tablet Take by mouth.     diphenhydramine-acetaminophen (TYLENOL PM) 25-500 MG TABS tablet Take 1 tablet by mouth at bedtime as needed. (Patient not taking: Reported on 03/21/2022)     FLONASE ALLERGY RELIEF 50 MCG/ACT nasal spray      lamoTRIgine (LAMICTAL) 150 MG tablet 2 tablet Orally Once  a day     naloxone (NARCAN) 4 MG/0.1ML LIQD nasal spray kit Place 0.4 mg into the nose as needed (opioid  overdose).  (Patient not taking: Reported on 03/21/2022)     No current facility-administered medications on file prior to visit.        ROS:  All others reviewed and negative.  Objective  PE:  BP 120/74 (BP Location: Left Arm, Patient Position: Sitting, Cuff Size: Large)   Pulse 74   Temp 98.2 F (36.8 C) (Other (Comment))   Ht _0  (1.626 m)   Wt 138 lb (62.6 kg)   SpO2 96%   BMI 23.69 kg/m                 Constitutional: Pt appears in NAD               HENT: Head: NCAT.                Right Ear: External ear normal.                 Left Ear: External ear normal.                Eyes: . Pupils are equal, round, and reactive to light. Conjunctivae and EOM are normal               Nose: without d/c or deformity               Neck: Neck supple. Gross normal ROM               Cardiovascular: Normal rate and regular rhythm.                 Pulmonary/Chest: Effort normal and breath sounds without rales or wheezing.                Abd:  Soft, NT, ND, + BS, no organomegaly               Neurological: Pt is alert. At baseline orientation, motor grossly intact               Skin: Skin is warm. No rashes, no other new lesions, LE edema - none               Psychiatric: Pt behavior is normal without agitation   Micro: none  Cardiac tracings I have personally interpreted today:  none  Pertinent Radiological findings (summarize): none   Lab Results  Component Value Date   WBC 7.5 07/16/2021   HGB 13.8 07/16/2021   HCT 42.0 07/16/2021   PLT 346.0 07/16/2021   GLUCOSE 103 (H) 07/16/2021   CHOL 128 07/16/2021   TRIG 60.0 07/16/2021   HDL 56.70 07/16/2021   LDLDIRECT 102.7 08/21/2011   LDLCALC 59 07/16/2021   ALT 13 07/16/2021   AST 11 07/16/2021   NA 133 (L) 07/16/2021   K 3.9 07/16/2021   CL 98 07/16/2021   CREATININE 0.86 07/16/2021   BUN 11 07/16/2021   CO2 29 07/16/2021   TSH 2.07 07/16/2021   INR 0.9 12/30/2019   HGBA1C 4.8 07/16/2021   Assessment/Plan:   [name] is a 66 y.o. White or Caucasian [1] female with  has a past medical history of Allergic rhinitis, Arthritis, Brain aneurysm, Chronic abdominal pain, COPD (chronic obstructive pulmonary disease) ([place]), Depression, Fibromyalgia, Gastroparesis, GERD (gastroesophageal reflux disease), H/O hiatal hernia, History of colonic polyps, HLD (hyperlipidemia), IBS (irritable bowel syndrome), Migraines, OCD (obsessive compulsive disorder), and Peripheral neuropathy.  COPD (chronic obstructive pulmonary disease) Stable overall, pt to continue albuterol inhaler prn  Vitamin D deficiency Last vitamin D Lab Results  Component Value Date   VD25OH 44.86 07/16/2021   Stable, cont oral replacement  Hyperglycemia Lab Results  Component Value Date   HGBA1C 4.8 07/16/2021   Stable, pt to continue  current medical treatment  - diet, wt control, excercise   HLD (hyperlipidemia) Lab Results  Component Value Date   LDLCALC 59 07/16/2021   Stable, pt to continue current statin lipitor 40 mg qd  Followup: No follow-ups on file.  Cathlean Cower, MD 04/17/2022 7:14 PM [facility]

## 2022-04-19 ENCOUNTER — Other Ambulatory Visit: Payer: Self-pay | Admitting: Physician Assistant

## 2022-04-23 ENCOUNTER — Telehealth: Payer: Self-pay | Admitting: Physician Assistant

## 2022-04-23 NOTE — Telephone Encounter (Signed)
Prior Authorization for Dayvigo 10 mg submitted through [employer], preferred alternatives are Trazodone, Belsomra, and Ramelteon.   Pending response.

## 2022-04-23 NOTE — Telephone Encounter (Signed)
I don't see a note in Epic and am unable to see anything in [facility].

## 2022-04-23 NOTE — Telephone Encounter (Signed)
Let me check on this

## 2022-04-23 NOTE — Telephone Encounter (Signed)
Pt called. Pharmacy advised PA needed for Dayvigo For insurance to cover, sent 10/26. Checking status

## 2022-04-24 DIAGNOSIS — S76019A Strain of muscle, fascia and tendon of unspecified hip, initial encounter: Secondary | ICD-10-CM | POA: Diagnosis not present

## 2022-04-24 DIAGNOSIS — M25552 Pain in left hip: Secondary | ICD-10-CM | POA: Diagnosis not present

## 2022-04-24 DIAGNOSIS — M47816 Spondylosis without myelopathy or radiculopathy, lumbar region: Secondary | ICD-10-CM | POA: Diagnosis not present

## 2022-04-24 NOTE — Telephone Encounter (Signed)
Patient notified

## 2022-04-24 NOTE — Telephone Encounter (Signed)
Prior Approval received effective through 06/17/2023 for Dayvigo 10 mg with [employer], Medicare Part D. Pt won't be able to use co-pay card just FYI.

## 2022-05-14 ENCOUNTER — Telehealth: Payer: Self-pay | Admitting: Physician Assistant

## 2022-05-14 MED ORDER — QUETIAPINE FUMARATE ER 400 MG PO TB24: 800.0000 mg | ORAL_TABLET | Freq: Every day | ORAL | 1 refills | Status: DC

## 2022-05-14 NOTE — Telephone Encounter (Signed)
Sent in Rx for 30-day RF.

## 2022-05-14 NOTE — Telephone Encounter (Signed)
Pt called and said that the seroquel xr 400 mg. She needs it to be 30 day supply with refills. The 90 days is too expensive. So please cancel the old script and send in a new one

## 2022-05-21 DIAGNOSIS — M961 Postlaminectomy syndrome, not elsewhere classified: Secondary | ICD-10-CM | POA: Diagnosis not present

## 2022-05-21 DIAGNOSIS — Z79891 Long term (current) use of opiate analgesic: Secondary | ICD-10-CM | POA: Diagnosis not present

## 2022-05-30 DIAGNOSIS — M5136 Other intervertebral disc degeneration, lumbar region: Secondary | ICD-10-CM | POA: Diagnosis not present

## 2022-05-30 DIAGNOSIS — M542 Cervicalgia: Secondary | ICD-10-CM | POA: Diagnosis not present

## 2022-05-30 DIAGNOSIS — M4726 Other spondylosis with radiculopathy, lumbar region: Secondary | ICD-10-CM | POA: Diagnosis not present

## 2022-05-30 DIAGNOSIS — M961 Postlaminectomy syndrome, not elsewhere classified: Secondary | ICD-10-CM | POA: Diagnosis not present

## 2022-05-30 DIAGNOSIS — Z79891 Long term (current) use of opiate analgesic: Secondary | ICD-10-CM | POA: Diagnosis not present

## 2022-06-06 DIAGNOSIS — R0602 Shortness of breath: Secondary | ICD-10-CM | POA: Diagnosis not present

## 2022-06-06 DIAGNOSIS — R109 Unspecified abdominal pain: Secondary | ICD-10-CM | POA: Diagnosis not present

## 2022-06-06 DIAGNOSIS — R110 Nausea: Secondary | ICD-10-CM | POA: Diagnosis not present

## 2022-06-06 DIAGNOSIS — Z5321 Procedure and treatment not carried out due to patient leaving prior to being seen by health care provider: Secondary | ICD-10-CM | POA: Diagnosis not present

## 2022-06-06 DIAGNOSIS — R1084 Generalized abdominal pain: Secondary | ICD-10-CM | POA: Diagnosis not present

## 2022-06-06 DIAGNOSIS — R197 Diarrhea, unspecified: Secondary | ICD-10-CM | POA: Diagnosis not present

## 2022-06-06 DIAGNOSIS — Z743 Need for continuous supervision: Secondary | ICD-10-CM | POA: Diagnosis not present

## 2022-06-08 DIAGNOSIS — K838 Other specified diseases of biliary tract: Secondary | ICD-10-CM | POA: Diagnosis not present

## 2022-06-08 DIAGNOSIS — R1012 Left upper quadrant pain: Secondary | ICD-10-CM | POA: Diagnosis not present

## 2022-06-08 DIAGNOSIS — R1084 Generalized abdominal pain: Secondary | ICD-10-CM | POA: Diagnosis not present

## 2022-06-08 DIAGNOSIS — Z9049 Acquired absence of other specified parts of digestive tract: Secondary | ICD-10-CM | POA: Diagnosis not present

## 2022-06-14 ENCOUNTER — Other Ambulatory Visit: Payer: Self-pay | Admitting: Internal Medicine

## 2022-06-15 NOTE — Telephone Encounter (Signed)
Please refill as per office routine med refill policy (all routine meds to be refilled for 3 mo or monthly (per pt preference) up to one year from last visit, then month to month grace period for 3 mo, then further med refills will have to be denied) ? ?

## 2022-06-19 ENCOUNTER — Other Ambulatory Visit: Payer: Self-pay | Admitting: Internal Medicine

## 2022-06-19 NOTE — Telephone Encounter (Signed)
Please refill as per office routine med refill policy (all routine meds to be refilled for 3 mo or monthly (per pt preference) up to one year from last visit, then month to month grace period for 3 mo, then further med refills will have to be denied) ? ?

## 2022-06-24 ENCOUNTER — Ambulatory Visit (INDEPENDENT_AMBULATORY_CARE_PROVIDER_SITE_OTHER): Payer: Medicare Other | Admitting: Physician Assistant

## 2022-06-24 DIAGNOSIS — F17211 Nicotine dependence, cigarettes, in remission: Secondary | ICD-10-CM | POA: Diagnosis not present

## 2022-07-08 ENCOUNTER — Ambulatory Visit: Payer: Medicare Other | Admitting: Physician Assistant

## 2022-07-11 DIAGNOSIS — R911 Solitary pulmonary nodule: Secondary | ICD-10-CM | POA: Diagnosis not present

## 2022-07-11 DIAGNOSIS — R918 Other nonspecific abnormal finding of lung field: Secondary | ICD-10-CM | POA: Diagnosis not present

## 2022-07-11 DIAGNOSIS — J449 Chronic obstructive pulmonary disease, unspecified: Secondary | ICD-10-CM | POA: Diagnosis not present

## 2022-07-11 DIAGNOSIS — F17211 Nicotine dependence, cigarettes, in remission: Secondary | ICD-10-CM | POA: Diagnosis not present

## 2022-07-14 ENCOUNTER — Other Ambulatory Visit: Payer: Self-pay | Admitting: Physician Assistant

## 2022-07-25 ENCOUNTER — Other Ambulatory Visit: Payer: Self-pay | Admitting: Internal Medicine

## 2022-07-25 DIAGNOSIS — Z79891 Long term (current) use of opiate analgesic: Secondary | ICD-10-CM | POA: Diagnosis not present

## 2022-07-25 DIAGNOSIS — M4726 Other spondylosis with radiculopathy, lumbar region: Secondary | ICD-10-CM | POA: Diagnosis not present

## 2022-07-25 DIAGNOSIS — M542 Cervicalgia: Secondary | ICD-10-CM | POA: Diagnosis not present

## 2022-07-25 DIAGNOSIS — M961 Postlaminectomy syndrome, not elsewhere classified: Secondary | ICD-10-CM | POA: Diagnosis not present

## 2022-07-25 DIAGNOSIS — M5136 Other intervertebral disc degeneration, lumbar region: Secondary | ICD-10-CM | POA: Diagnosis not present

## 2022-07-26 NOTE — Telephone Encounter (Signed)
Please refill as per office routine med refill policy (all routine meds to be refilled for 3 mo or monthly (per pt preference) up to one year from last visit, then month to month grace period for 3 mo, then further med refills will have to be denied) ? ?

## 2022-07-28 ENCOUNTER — Other Ambulatory Visit: Payer: Self-pay | Admitting: Physician Assistant

## 2022-07-29 ENCOUNTER — Ambulatory Visit: Payer: Medicare Other | Admitting: Physician Assistant

## 2022-07-30 ENCOUNTER — Ambulatory Visit: Payer: Medicare Other | Admitting: Physician Assistant

## 2022-08-06 ENCOUNTER — Ambulatory Visit (INDEPENDENT_AMBULATORY_CARE_PROVIDER_SITE_OTHER): Payer: Medicare Other | Admitting: Physician Assistant

## 2022-08-06 ENCOUNTER — Encounter: Payer: Self-pay | Admitting: Physician Assistant

## 2022-08-06 DIAGNOSIS — F411 Generalized anxiety disorder: Secondary | ICD-10-CM

## 2022-08-06 DIAGNOSIS — F259 Schizoaffective disorder, unspecified: Secondary | ICD-10-CM | POA: Diagnosis not present

## 2022-08-06 DIAGNOSIS — F99 Mental disorder, not otherwise specified: Secondary | ICD-10-CM

## 2022-08-06 DIAGNOSIS — F5105 Insomnia due to other mental disorder: Secondary | ICD-10-CM

## 2022-08-06 MED ORDER — QUETIAPINE FUMARATE ER 300 MG PO TB24: 600.0000 mg | ORAL_TABLET | Freq: Every day | ORAL | 0 refills | Status: DC

## 2022-08-06 MED ORDER — ARIPIPRAZOLE 10 MG PO TABS: 10.0000 mg | ORAL_TABLET | Freq: Every day | ORAL | 3 refills | Status: DC

## 2022-08-06 NOTE — Progress Notes (Signed)
[facility] Check  Patient ID: [name],  MRN: [id number]  PCP: Biagio Borg, MD  Date of Evaluation: 08/06/2022 Time spent:30 minutes  Chief Complaint:  Chief Complaint   Follow-up    HISTORY/CURRENT STATUS: HPI For routine med check.   Seroquel is making her gain wt. Has been on it for several years though.  States she will start eating within a few hours after she takes it and if she is awake during the night which is a common problem, she is eating.  Has trouble falling asleep and staying asleep.  None of the sleep medications help.  The Seroquel does help her go to sleep.  Patient is able to enjoy things.  Energy and motivation are good.  No extreme sadness, tearfulness, or feelings of hopelessness.  ADLs and personal hygiene are normal.   Denies any changes in concentration, making decisions, or remembering things.  Appetite has increased over the past several months.  Not having a lot of anxiety.  Denies suicidal or homicidal thoughts.  Patient denies increased energy with decreased need for sleep, increased talkativeness, racing thoughts, impulsivity or risky behaviors, increased spending, increased libido, grandiosity, increased irritability or anger, paranoia, or hallucinations.  Denies dizziness, syncope, seizures, numbness, tingling, tics, unsteady gait, slurred speech, confusion.  Has chronic back pain, uses a walking cane.  Denies unexplained weight loss, frequent infections, or sores that heal slowly.  No polyphagia, polydipsia, or polyuria. Denies visual changes or paresthesias.   Individual Medical History/ Review of Systems: Changes? :No       Past medications for mental health diagnoses include: Ingrezza was ineffective, Lamictal, Risperdal, Zyprexa, Cogentin, Latuda, Cymbalta, lithium, Ambien, prazosin, Ativan, Wellbutrin, Zoloft, and probably others, trazodone possibly caused double vision and a fall. Seroquel, Gabapentin didn't help anxiety, Mirtazepine  caused dizziness  Allergies: Ambien [zolpidem tartrate]; Anesthetics, halogenated; Hydrocodone; Lactose intolerance (gi); Nsaids; Propoxyphene n-acetaminophen; Tramadol; and Chantix [varenicline]  Current Medications:  Current Outpatient Medications:    albuterol (PROVENTIL) (2.5 MG/3ML) 0.083% nebulizer solution, , Disp: , Rfl:    aspirin 325 MG tablet, Take 325 mg by mouth daily., Disp: , Rfl:    atorvastatin (LIPITOR) 40 MG tablet, TAKE 1 TABLET BY MOUTH  DAILY, Disp: 100 tablet, Rfl: 2   bisacodyl (DULCOLAX) 5 MG EC tablet, Take by mouth., Disp: , Rfl:    buPROPion (WELLBUTRIN XL) 150 MG 24 hr tablet, TAKE 1 TABLET BY MOUTH DAILY  WITH 300 MG FOR TOTAL OF 450 MG, Disp: 90 tablet, Rfl: 3   buPROPion (WELLBUTRIN XL) 300 MG 24 hr tablet, TAKE 1 TABLET BY MOUTH DAILY  WITH 150 MG FOR TOTAL OF 450 MG, Disp: 90 tablet, Rfl: 3   cetirizine (ZYRTEC) 10 MG tablet, Take by mouth., Disp: , Rfl:    Cholecalciferol (VITAMIN D) 50 MCG (2000 UT) tablet, Take 2,000 Units by mouth daily., Disp: , Rfl:    diphenhydrAMINE (BENADRYL) 25 MG tablet, Take by mouth., Disp: , Rfl:    FLONASE ALLERGY RELIEF 50 MCG/ACT nasal spray, , Disp: , Rfl:    FLUCELVAX QUADRIVALENT SUSP injection, , Disp: , Rfl:    ibuprofen (ADVIL) 200 MG tablet, Take by mouth., Disp: , Rfl:    lamoTRIgine (LAMICTAL) 100 MG tablet, TAKE 1 TABLET BY MOUTH IN THE  MORNING AND 3 TABLETS BY MOUTH  AT BEDTIME, Disp: 400 tablet, Rfl: 2   methocarbamol (ROBAXIN) 500 MG tablet, 1.5 tablets Orally FOUR TIMES A DAY, Disp: , Rfl:    omeprazole (PRILOSEC) 40  MG capsule, Take 40 mg by mouth 2 (two) times daily., Disp: , Rfl:    Oxycodone HCl 10 MG TABS, Take 1 tablet by mouth every 6 (six) hours as needed (pain). , Disp: , Rfl:    potassium chloride (KLOR-CON M) 10 MEQ tablet, TAKE 1 TABLET BY MOUTH  DAILY, Disp: 90 tablet, Rfl: 2   Probiotic Product (PROBIOTIC DAILY PO), Take by mouth., Disp: , Rfl:    promethazine (PHENERGAN) 12.5 MG tablet, Take 1  tablet by mouth  every 6 hours as needed for nausea, Disp: 90 tablet, Rfl: 0   QUEtiapine (SEROQUEL XR) 300 MG 24 hr tablet, Take 2 tablets (600 mg total) by mouth at bedtime., Disp: 60 tablet, Rfl: 0   sucralfate (CARAFATE) 1 g tablet, TAKE ONE TABLET BY MOUTH FOUR TIMES DAILY, Disp: 120 tablet, Rfl: 0   vitamin B-12 (CYANOCOBALAMIN) 1000 MCG tablet, Take 1 tablet (1,000 mcg total) by mouth daily., Disp: 90 tablet, Rfl: 3   ARIPiprazole (ABILIFY) 10 MG tablet, Take 1 tablet (10 mg total) by mouth daily., Disp: 90 tablet, Rfl: 3   diphenhydramine-acetaminophen (TYLENOL PM) 25-500 MG TABS tablet, Take 1 tablet by mouth at bedtime as needed. (Patient not taking: Reported on 03/21/2022), Disp: , Rfl:    naloxone (NARCAN) 4 MG/0.1ML LIQD nasal spray kit, Place 0.4 mg into the nose as needed (opioid overdose).  (Patient not taking: Reported on 03/21/2022), Disp: , Rfl:    PFIZER COVID-19 VAC BIVALENT injection, , Disp: , Rfl:  Medication Side Effects: none   Family Medical/ Social History: Changes? no  MENTAL HEALTH EXAM:  There were no vitals taken for this visit.There is no height or weight on file to calculate BMI. Patient left before I could get her weight.  General Appearance: Casual, Well Groomed, and edentulous  Eye Contact:  Good  Speech:  Clear and Coherent and Normal Rate  Volume:  Normal  Mood:  Euthymic  Affect:  Congruent  Thought Process:  Goal Directed and Descriptions of Associations: Circumstantial  Orientation:  Full (Time, Place, and Person)  Thought Content: Logical   Suicidal Thoughts:  No  Homicidal Thoughts:  No  Memory:  Recent;   Fair Remote;   Good  Judgement:  Good  Insight:  Good  Psychomotor Activity:   walks slowly using a cane  Concentration:  Concentration: Good and Attention Span: Fair  Recall:  Good  Fund of Knowledge: Good  Language: Good  Assets:  Desire for Improvement Housing Transportation  ADL's:  Intact  Cognition: WNL  Prognosis:  Good    Labs 06/08/2022 CBC see on chart CMP Glu 123  07/16/2021 Lipid panel completely nl  PCP follows her labs.  DIAGNOSES:    ICD-10-CM   1. Schizoaffective disorder, unspecified type ([place])  F25.9     2. Insomnia due to other mental disorder  F51.05    F99     3. Generalized anxiety disorder  F41.1      Receiving Psychotherapy: No   RECOMMENDATIONS:  PDMP reviewed.  Dayvigo filled 04/23/2022.  Oxycodone is known to me.  I provided 30 minutes of face to face time during this encounter, including time spent before and after the visit in records review, medical decision making, counseling pertinent to today's visit, and charting.   I am not sure that the weight gain is only due to the Seroquel although I am sure it is part of the issue.  We agree to start weaning off, the Abilify has not  caused weight gain that she knows of and she does not have increased hunger after she takes that one in the morning.  She will let me know if she starts having any hallucinations or other problems by decreasing the Seroquel.  Continue Abilify 10 mg, 1 p.o. every morning. Continue Wellbutrin XL 450 mg p.o. daily.  Continue Lamictal 100 mg, 1 p.o. every morning and 3 p.o. nightly. Decrease Seroquel XR to 300 mg, 2 p.o. nightly. Return in 4 weeks.  Donnal Moat, PA-C

## 2022-09-02 ENCOUNTER — Other Ambulatory Visit: Payer: Self-pay | Admitting: Physician Assistant

## 2022-09-03 ENCOUNTER — Ambulatory Visit (INDEPENDENT_AMBULATORY_CARE_PROVIDER_SITE_OTHER): Payer: Medicare Other | Admitting: Physician Assistant

## 2022-09-03 DIAGNOSIS — H40003 Preglaucoma, unspecified, bilateral: Secondary | ICD-10-CM | POA: Diagnosis not present

## 2022-09-04 ENCOUNTER — Ambulatory Visit: Payer: Medicare Other | Admitting: Physician Assistant

## 2022-09-20 ENCOUNTER — Ambulatory Visit: Payer: Medicare Other | Admitting: Physician Assistant

## 2022-09-20 ENCOUNTER — Encounter: Payer: Self-pay | Admitting: Physician Assistant

## 2022-09-20 DIAGNOSIS — F411 Generalized anxiety disorder: Secondary | ICD-10-CM

## 2022-09-20 DIAGNOSIS — F5105 Insomnia due to other mental disorder: Secondary | ICD-10-CM | POA: Diagnosis not present

## 2022-09-20 DIAGNOSIS — F259 Schizoaffective disorder, unspecified: Secondary | ICD-10-CM | POA: Diagnosis not present

## 2022-09-20 DIAGNOSIS — F99 Mental disorder, not otherwise specified: Secondary | ICD-10-CM

## 2022-09-20 MED ORDER — ARIPIPRAZOLE 10 MG PO TABS: 5.0000 mg | ORAL_TABLET | Freq: Every day | ORAL | 3 refills | Status: DC

## 2022-09-20 MED ORDER — QUETIAPINE FUMARATE ER 300 MG PO TB24: 600.0000 mg | ORAL_TABLET | Freq: Every day | ORAL | 11 refills | Status: DC

## 2022-09-20 NOTE — Progress Notes (Deleted)
[facility]  Patient ID: [name],  MRN: [id number]  PCP: Corwin LevinsJohn, James W, MD  Date of Evaluation: 09/20/2022 Time spent:20 minutes  Chief Complaint:  Chief Complaint   Anxiety; Depression; Insomnia; Follow-up    HISTORY/CURRENT STATUS: HPI For routine med check.   Still gaining weight, even after decreasing the Seroquel at the last visit.  Her weight has been gradually creeping up for about 6 months now. Has been on Abilify and Seroquel for about a year now, was on Seroquel initially but Abilify was added after her friend died a few months before, she was very depressed, could not get out of bed, had anhedonia.  The Abilify did help her get through that period of time but neither of us are sure she needs it right now.  Patient is able to enjoy things.  Energy and motivation are good.  She has been doing things around the house like typical housework things, mopping, vacuuming etc.  She has also painted her bathroom, without asking anyone for help.  No extreme sadness, tearfulness, or feelings of hopelessness.  The Seroquel helps with sleep.  She is probably getting about 6 hours now.  She does wake up during the middle of the night sometimes but is able to go back to sleep.  ADLs and personal hygiene are normal.   Denies any changes in concentration, making decisions, or remembering things.  Appetite is increased, especially before bed.  No complaints of anxiety.  No panic attacks.  Denies suicidal or homicidal thoughts.  Patient denies increased energy with decreased need for sleep, increased talkativeness, racing thoughts, impulsivity or risky behaviors, increased spending, increased libido, grandiosity, increased irritability or anger, paranoia, or hallucinations.  Denies dizziness, syncope, seizures, numbness, tingling, tics, unsteady gait, slurred speech, confusion.  Has chronic back pain, uses a walking cane.  Denies unexplained weight loss, frequent infections, or sores  that heal slowly.  No polyphagia, polydipsia, or polyuria. Denies visual changes or paresthesias.   Individual Medical History/ Review of Systems: Changes? :No       Past medications for mental health diagnoses include: Ingrezza was ineffective, Lamictal, Risperdal, Zyprexa, Cogentin, Latuda, Cymbalta, lithium, Ambien, prazosin, Ativan, Wellbutrin, Zoloft, and probably others, trazodone possibly caused double vision and a fall. Seroquel, Gabapentin didn't help anxiety, Mirtazepine caused dizziness  Allergies: Ambien [zolpidem tartrate]; Anesthetics, halogenated; Hydrocodone; Lactose intolerance (gi); Nsaids; Propoxyphene n-acetaminophen; Tramadol; and Chantix [varenicline]  Current Medications:  Current Outpatient Medications:    albuterol (PROVENTIL) (2.5 MG/3ML) 0.083% nebulizer solution, , Disp: , Rfl:    aspirin 325 MG tablet, Take 325 mg by mouth daily., Disp: , Rfl:    atorvastatin (LIPITOR) 40 MG tablet, TAKE 1 TABLET BY MOUTH  DAILY, Disp: 100 tablet, Rfl: 2   bisacodyl (DULCOLAX) 5 MG EC tablet, Take by mouth., Disp: , Rfl:    buPROPion (WELLBUTRIN XL) 150 MG 24 hr tablet, TAKE 1 TABLET BY MOUTH DAILY  WITH 300 MG FOR TOTAL OF 450 MG, Disp: 90 tablet, Rfl: 3   buPROPion (WELLBUTRIN XL) 300 MG 24 hr tablet, TAKE 1 TABLET BY MOUTH DAILY  WITH 150 MG FOR TOTAL OF 450 MG, Disp: 90 tablet, Rfl: 3   cetirizine (ZYRTEC) 10 MG tablet, Take by mouth., Disp: , Rfl:    Cholecalciferol (VITAMIN D) 50 MCG (2000 UT) tablet, Take 2,000 Units by mouth daily., Disp: , Rfl:    diphenhydrAMINE (BENADRYL) 25 MG tablet, Take by mouth., Disp: , Rfl:    FLONASE ALLERGY RELIEF 50  MCG/ACT nasal spray, , Disp: , Rfl:    ibuprofen (ADVIL) 200 MG tablet, Take by mouth., Disp: , Rfl:    lamoTRIgine (LAMICTAL) 100 MG tablet, TAKE 1 TABLET BY MOUTH IN THE  MORNING AND 3 TABLETS BY MOUTH  AT BEDTIME, Disp: 400 tablet, Rfl: 2   methocarbamol (ROBAXIN) 500 MG tablet, 1.5 tablets Orally FOUR TIMES A DAY, Disp: , Rfl:     omeprazole (PRILOSEC) 40 MG capsule, Take 40 mg by mouth 2 (two) times daily., Disp: , Rfl:    Oxycodone HCl 10 MG TABS, Take 1 tablet by mouth every 6 (six) hours as needed (pain). , Disp: , Rfl:    potassium chloride (KLOR-CON M) 10 MEQ tablet, TAKE 1 TABLET BY MOUTH  DAILY, Disp: 90 tablet, Rfl: 2   Probiotic Product (PROBIOTIC DAILY PO), Take by mouth., Disp: , Rfl:    promethazine (PHENERGAN) 12.5 MG tablet, Take 1 tablet by mouth  every 6 hours as needed for nausea, Disp: 90 tablet, Rfl: 0   sucralfate (CARAFATE) 1 g tablet, TAKE ONE TABLET BY MOUTH FOUR TIMES DAILY, Disp: 120 tablet, Rfl: 0   vitamin B-12 (CYANOCOBALAMIN) 1000 MCG tablet, Take 1 tablet (1,000 mcg total) by mouth daily., Disp: 90 tablet, Rfl: 3   ARIPiprazole (ABILIFY) 10 MG tablet, Take 0.5 tablets (5 mg total) by mouth daily., Disp: 45 tablet, Rfl: 3   diphenhydramine-acetaminophen (TYLENOL PM) 25-500 MG TABS tablet, Take 1 tablet by mouth at bedtime as needed. (Patient not taking: Reported on 03/21/2022), Disp: , Rfl:    FLUCELVAX QUADRIVALENT SUSP injection, , Disp: , Rfl:    naloxone (NARCAN) 4 MG/0.1ML LIQD nasal spray kit, Place 0.4 mg into the nose as needed (opioid overdose).  (Patient not taking: Reported on 03/21/2022), Disp: , Rfl:    PFIZER COVID-19 VAC BIVALENT injection, , Disp: , Rfl:    QUEtiapine (SEROQUEL XR) 300 MG 24 hr tablet, Take 2 tablets (600 mg total) by mouth at bedtime., Disp: 60 tablet, Rfl: 11 Medication Side Effects: none   Family Medical/ Social History: Changes? no  MENTAL HEALTH EXAM:  There were no vitals taken for this visit.There is no height or weight on file to calculate BMI.   General Appearance: Casual, Well Groomed, and edentulous  Eye Contact:  Good  Speech:  Clear and Coherent and Normal Rate  Volume:  Normal  Mood:  Euthymic  Affect:  Congruent  Thought Process:  Goal Directed and Descriptions of Associations: Circumstantial  Orientation:  Full (Time, Place, and Person)   Thought Content: Logical   Suicidal Thoughts:  No  Homicidal Thoughts:  No  Memory:  Recent;   Fair Remote;   Good  Judgement:  Good  Insight:  Good  Psychomotor Activity:   walks slowly using a cane  Concentration:  Concentration: Good and Attention Span: Fair  Recall:  Good  Fund of Knowledge: Good  Language: Good  Assets:  Desire for Improvement Financial Resources/Insurance Housing Transportation  ADL's:  Intact  Cognition: WNL  Prognosis:  Good   PCP follows her labs.  DIAGNOSES:    ICD-10-CM   1. Schizoaffective disorder, unspecified type  F25.9     2. Insomnia due to other mental disorder  F51.05    F99     3. Generalized anxiety disorder  F41.1      Receiving Psychotherapy: No   RECOMMENDATIONS:  PDMP reviewed.  [place] filled 04/23/2022.  Oxycodone is known to me.  I provided 20 minutes of face to  face time during this encounter, including time spent before and after the visit in records review, medical decision making, counseling pertinent to today's visit, and charting.   We discussed the increased appetite and weight gain.  The Seroquel and Abilify may be causing increased hunger.  She may not need to be on both medications now.  We agreed to decrease Abilify, recheck in about 6 weeks and if she is doing okay we can wean her off of the Abilify completely.  Neither of us want to stop the Seroquel because it not only helps mood it helps her sleep as well.  Decrease Abilify to 10 mg to 1/2 pill every morning. Continue Wellbutrin XL 450 mg p.o. daily.  Continue Lamictal 100 mg, 1 p.o. every morning and 3 p.o. nightly. Continue Seroquel XR 300 mg, 2 p.o. nightly. Return in 6 weeks.  Padraig Nhan, PA-C  

## 2022-09-20 NOTE — Progress Notes (Signed)
[facility]  Patient ID: [name],  MRN: [id number]  PCP: John, James W, MD  Date of Evaluation: 09/20/2022 Time spent:20 minutes  Chief Complaint:  Chief Complaint   Anxiety; Depression; Insomnia; Follow-up    HISTORY/CURRENT STATUS: HPI For routine med check.   Still gaining weight, even after decreasing the Seroquel at the last visit.  Her weight has been gradually creeping up for about 6 months now. Has been on Abilify and Seroquel for about a year now, was on Seroquel initially but Abilify was added after her friend died a few months before, she was very depressed, could not get out of bed, had anhedonia.  The Abilify did help her get through that period of time but neither of us are sure she needs it right now.  Patient is able to enjoy things.  Energy and motivation are good.  She has been doing things around the house like typical housework things, mopping, vacuuming etc.  She has also painted her bathroom, without asking anyone for help.  No extreme sadness, tearfulness, or feelings of hopelessness.  The Seroquel helps with sleep.  She is probably getting about 6 hours now.  She does wake up during the middle of the night sometimes but is able to go back to sleep.  ADLs and personal hygiene are normal.   Denies any changes in concentration, making decisions, or remembering things.  Appetite is increased, especially before bed.  No complaints of anxiety.  No panic attacks.  Denies suicidal or homicidal thoughts.  Patient denies increased energy with decreased need for sleep, increased talkativeness, racing thoughts, impulsivity or risky behaviors, increased spending, increased libido, grandiosity, increased irritability or anger, paranoia, or hallucinations.  Denies dizziness, syncope, seizures, numbness, tingling, tics, unsteady gait, slurred speech, confusion.  Has chronic back pain, uses a walking cane.  Denies unexplained weight loss, frequent infections, or sores  that heal slowly.  No polyphagia, polydipsia, or polyuria. Denies visual changes or paresthesias.   Individual Medical History/ Review of Systems: Changes? :No       Past medications for mental health diagnoses include: Ingrezza was ineffective, Lamictal, Risperdal, Zyprexa, Cogentin, Latuda, Cymbalta, lithium, Ambien, prazosin, Ativan, Wellbutrin, Zoloft, and probably others, trazodone possibly caused double vision and a fall. Seroquel, Gabapentin didn't help anxiety, Mirtazepine caused dizziness  Allergies: Ambien [zolpidem tartrate]; Anesthetics, halogenated; Hydrocodone; Lactose intolerance (gi); Nsaids; Propoxyphene n-acetaminophen; Tramadol; and Chantix [varenicline]  Current Medications:  Current Outpatient Medications:    albuterol (PROVENTIL) (2.5 MG/3ML) 0.083% nebulizer solution, , Disp: , Rfl:    aspirin 325 MG tablet, Take 325 mg by mouth daily., Disp: , Rfl:    atorvastatin (LIPITOR) 40 MG tablet, TAKE 1 TABLET BY MOUTH  DAILY, Disp: 100 tablet, Rfl: 2   bisacodyl (DULCOLAX) 5 MG EC tablet, Take by mouth., Disp: , Rfl:    buPROPion (WELLBUTRIN XL) 150 MG 24 hr tablet, TAKE 1 TABLET BY MOUTH DAILY  WITH 300 MG FOR TOTAL OF 450 MG, Disp: 90 tablet, Rfl: 3   buPROPion (WELLBUTRIN XL) 300 MG 24 hr tablet, TAKE 1 TABLET BY MOUTH DAILY  WITH 150 MG FOR TOTAL OF 450 MG, Disp: 90 tablet, Rfl: 3   cetirizine (ZYRTEC) 10 MG tablet, Take by mouth., Disp: , Rfl:    Cholecalciferol (VITAMIN D) 50 MCG (2000 UT) tablet, Take 2,000 Units by mouth daily., Disp: , Rfl:    diphenhydrAMINE (BENADRYL) 25 MG tablet, Take by mouth., Disp: , Rfl:    FLONASE ALLERGY RELIEF 50   MCG/ACT nasal spray, , Disp: , Rfl:    ibuprofen (ADVIL) 200 MG tablet, Take by mouth., Disp: , Rfl:    lamoTRIgine (LAMICTAL) 100 MG tablet, TAKE 1 TABLET BY MOUTH IN THE  MORNING AND 3 TABLETS BY MOUTH  AT BEDTIME, Disp: 400 tablet, Rfl: 2   methocarbamol (ROBAXIN) 500 MG tablet, 1.5 tablets Orally FOUR TIMES A DAY, Disp: , Rfl:     omeprazole (PRILOSEC) 40 MG capsule, Take 40 mg by mouth 2 (two) times daily., Disp: , Rfl:    Oxycodone HCl 10 MG TABS, Take 1 tablet by mouth every 6 (six) hours as needed (pain). , Disp: , Rfl:    potassium chloride (KLOR-CON M) 10 MEQ tablet, TAKE 1 TABLET BY MOUTH  DAILY, Disp: 90 tablet, Rfl: 2   Probiotic Product (PROBIOTIC DAILY PO), Take by mouth., Disp: , Rfl:    promethazine (PHENERGAN) 12.5 MG tablet, Take 1 tablet by mouth  every 6 hours as needed for nausea, Disp: 90 tablet, Rfl: 0   sucralfate (CARAFATE) 1 g tablet, TAKE ONE TABLET BY MOUTH FOUR TIMES DAILY, Disp: 120 tablet, Rfl: 0   vitamin B-12 (CYANOCOBALAMIN) 1000 MCG tablet, Take 1 tablet (1,000 mcg total) by mouth daily., Disp: 90 tablet, Rfl: 3   ARIPiprazole (ABILIFY) 10 MG tablet, Take 0.5 tablets (5 mg total) by mouth daily., Disp: 45 tablet, Rfl: 3   diphenhydramine-acetaminophen (TYLENOL PM) 25-500 MG TABS tablet, Take 1 tablet by mouth at bedtime as needed. (Patient not taking: Reported on 03/21/2022), Disp: , Rfl:    FLUCELVAX QUADRIVALENT SUSP injection, , Disp: , Rfl:    naloxone (NARCAN) 4 MG/0.1ML LIQD nasal spray kit, Place 0.4 mg into the nose as needed (opioid overdose).  (Patient not taking: Reported on 03/21/2022), Disp: , Rfl:    PFIZER COVID-19 VAC BIVALENT injection, , Disp: , Rfl:    QUEtiapine (SEROQUEL XR) 300 MG 24 hr tablet, Take 2 tablets (600 mg total) by mouth at bedtime., Disp: 60 tablet, Rfl: 11 Medication Side Effects: none   Family Medical/ Social History: Changes? no  MENTAL HEALTH EXAM:  There were no vitals taken for this visit.There is no height or weight on file to calculate BMI.   General Appearance: Casual, Well Groomed, and edentulous  Eye Contact:  Good  Speech:  Clear and Coherent and Normal Rate  Volume:  Normal  Mood:  Euthymic  Affect:  Congruent  Thought Process:  Goal Directed and Descriptions of Associations: Circumstantial  Orientation:  Full (Time, Place, and Person)   Thought Content: Logical   Suicidal Thoughts:  No  Homicidal Thoughts:  No  Memory:  Recent;   Fair Remote;   Good  Judgement:  Good  Insight:  Good  Psychomotor Activity:   walks slowly using a cane  Concentration:  Concentration: Good and Attention Span: Fair  Recall:  Good  Fund of Knowledge: Good  Language: Good  Assets:  Desire for Improvement Financial Resources/Insurance Housing Transportation  ADL's:  Intact  Cognition: WNL  Prognosis:  Good   PCP follows her labs.  DIAGNOSES:    ICD-10-CM   1. Schizoaffective disorder, unspecified type  F25.9     2. Insomnia due to other mental disorder  F51.05    F99     3. Generalized anxiety disorder  F41.1      Receiving Psychotherapy: No   RECOMMENDATIONS:  PDMP reviewed.  [place] filled 04/23/2022.  Oxycodone is known to me.  I provided 20 minutes of face to   face time during this encounter, including time spent before and after the visit in records review, medical decision making, counseling pertinent to today's visit, and charting.   We discussed the increased appetite and weight gain.  The Seroquel and Abilify may be causing increased hunger.  She may not need to be on both medications now.  We agreed to decrease Abilify, recheck in about 6 weeks and if she is doing okay we can wean her off of the Abilify completely.  Neither of us want to stop the Seroquel because it not only helps mood it helps her sleep as well.  Decrease Abilify to 10 mg to 1/2 pill every morning. Continue Wellbutrin XL 450 mg p.o. daily.  Continue Lamictal 100 mg, 1 p.o. every morning and 3 p.o. nightly. Continue Seroquel XR 300 mg, 2 p.o. nightly. Return in 6 weeks.  Lavren Lewan, PA-C  

## 2022-09-20 NOTE — Progress Notes (Deleted)
[facility]  Patient ID: Lori LevinsLinda S [name],  MRN: [id number]  PCP: Corwin LevinsJohn, James W, MD  Date of Evaluation: 09/20/2022 Time spent:20 minutes  Chief Complaint:  Chief Complaint   Anxiety; Depression; Insomnia; Follow-up    HISTORY/CURRENT STATUS: HPI For routine med check.   Still gaining weight, even after decreasing the Seroquel at the last visit.  Her weight has been gradually creeping up for about 6 months now. Has been on Abilify and Seroquel for about a year now, was on Seroquel initially but Abilify was added after her friend died a few months before, she was very depressed, could not get out of bed, had anhedonia.  The Abilify did help her get through that period of time but neither of us are sure she needs it right now.  Patient is able to enjoy things.  Energy and motivation are good.  She has been doing things around the house like typical housework things, mopping, vacuuming etc.  She has also painted her bathroom, without asking anyone for help.  No extreme sadness, tearfulness, or feelings of hopelessness.  The Seroquel helps with sleep.  She is probably getting about 6 hours now.  She does wake up during the middle of the night sometimes but is able to go back to sleep.  ADLs and personal hygiene are normal.   Denies any changes in concentration, making decisions, or remembering things.  Appetite is increased, especially before bed.  No complaints of anxiety.  No panic attacks.  Denies suicidal or homicidal thoughts.  Patient denies increased energy with decreased need for sleep, increased talkativeness, racing thoughts, impulsivity or risky behaviors, increased spending, increased libido, grandiosity, increased irritability or anger, paranoia, or hallucinations.  Denies dizziness, syncope, seizures, numbness, tingling, tics, unsteady gait, slurred speech, confusion.  Has chronic back pain, uses a walking cane.  Denies unexplained weight loss, frequent infections, or sores  that heal slowly.  No polyphagia, polydipsia, or polyuria. Denies visual changes or paresthesias.   Individual Medical History/ Review of Systems: Changes? :No       Past medications for mental health diagnoses include: Ingrezza was ineffective, Lamictal, Risperdal, Zyprexa, Cogentin, Latuda, Cymbalta, lithium, Ambien, prazosin, Ativan, Wellbutrin, Zoloft, and probably others, trazodone possibly caused double vision and a fall. Seroquel, Gabapentin didn't help anxiety, Mirtazepine caused dizziness  Allergies: Ambien [zolpidem tartrate]; Anesthetics, halogenated; Hydrocodone; Lactose intolerance (gi); Nsaids; Propoxyphene n-acetaminophen; Tramadol; and Chantix [varenicline]  Current Medications:  Current Outpatient Medications:    albuterol (PROVENTIL) (2.5 MG/3ML) 0.083% nebulizer solution, , Disp: , Rfl:    aspirin 325 MG tablet, Take 325 mg by mouth daily., Disp: , Rfl:    atorvastatin (LIPITOR) 40 MG tablet, TAKE 1 TABLET BY MOUTH  DAILY, Disp: 100 tablet, Rfl: 2   bisacodyl (DULCOLAX) 5 MG EC tablet, Take by mouth., Disp: , Rfl:    buPROPion (WELLBUTRIN XL) 150 MG 24 hr tablet, TAKE 1 TABLET BY MOUTH DAILY  WITH 300 MG FOR TOTAL OF 450 MG, Disp: 90 tablet, Rfl: 3   buPROPion (WELLBUTRIN XL) 300 MG 24 hr tablet, TAKE 1 TABLET BY MOUTH DAILY  WITH 150 MG FOR TOTAL OF 450 MG, Disp: 90 tablet, Rfl: 3   cetirizine (ZYRTEC) 10 MG tablet, Take by mouth., Disp: , Rfl:    Cholecalciferol (VITAMIN D) 50 MCG (2000 UT) tablet, Take 2,000 Units by mouth daily., Disp: , Rfl:    diphenhydrAMINE (BENADRYL) 25 MG tablet, Take by mouth., Disp: , Rfl:    FLONASE ALLERGY RELIEF 50  MCG/ACT nasal spray, , Disp: , Rfl:    ibuprofen (ADVIL) 200 MG tablet, Take by mouth., Disp: , Rfl:    lamoTRIgine (LAMICTAL) 100 MG tablet, TAKE 1 TABLET BY MOUTH IN THE  MORNING AND 3 TABLETS BY MOUTH  AT BEDTIME, Disp: 400 tablet, Rfl: 2   methocarbamol (ROBAXIN) 500 MG tablet, 1.5 tablets Orally FOUR TIMES A DAY, Disp: , Rfl:     omeprazole (PRILOSEC) 40 MG capsule, Take 40 mg by mouth 2 (two) times daily., Disp: , Rfl:    Oxycodone HCl 10 MG TABS, Take 1 tablet by mouth every 6 (six) hours as needed (pain). , Disp: , Rfl:    potassium chloride (KLOR-CON M) 10 MEQ tablet, TAKE 1 TABLET BY MOUTH  DAILY, Disp: 90 tablet, Rfl: 2   Probiotic Product (PROBIOTIC DAILY PO), Take by mouth., Disp: , Rfl:    promethazine (PHENERGAN) 12.5 MG tablet, Take 1 tablet by mouth  every 6 hours as needed for nausea, Disp: 90 tablet, Rfl: 0   sucralfate (CARAFATE) 1 g tablet, TAKE ONE TABLET BY MOUTH FOUR TIMES DAILY, Disp: 120 tablet, Rfl: 0   vitamin B-12 (CYANOCOBALAMIN) 1000 MCG tablet, Take 1 tablet (1,000 mcg total) by mouth daily., Disp: 90 tablet, Rfl: 3   ARIPiprazole (ABILIFY) 10 MG tablet, Take 0.5 tablets (5 mg total) by mouth daily., Disp: 45 tablet, Rfl: 3   diphenhydramine-acetaminophen (TYLENOL PM) 25-500 MG TABS tablet, Take 1 tablet by mouth at bedtime as needed. (Patient not taking: Reported on 03/21/2022), Disp: , Rfl:    FLUCELVAX QUADRIVALENT SUSP injection, , Disp: , Rfl:    naloxone (NARCAN) 4 MG/0.1ML LIQD nasal spray kit, Place 0.4 mg into the nose as needed (opioid overdose).  (Patient not taking: Reported on 03/21/2022), Disp: , Rfl:    PFIZER COVID-19 VAC BIVALENT injection, , Disp: , Rfl:    QUEtiapine (SEROQUEL XR) 300 MG 24 hr tablet, Take 2 tablets (600 mg total) by mouth at bedtime., Disp: 60 tablet, Rfl: 11 Medication Side Effects: none   Family Medical/ Social History: Changes? no  MENTAL HEALTH EXAM:  There were no vitals taken for this visit.There is no height or weight on file to calculate BMI.   General Appearance: Casual, Well Groomed, and edentulous  Eye Contact:  Good  Speech:  Clear and Coherent and Normal Rate  Volume:  Normal  Mood:  Euthymic  Affect:  Congruent  Thought Process:  Goal Directed and Descriptions of Associations: Circumstantial  Orientation:  Full (Time, Place, and Person)   Thought Content: Logical   Suicidal Thoughts:  No  Homicidal Thoughts:  No  Memory:  Recent;   Fair Remote;   Good  Judgement:  Good  Insight:  Good  Psychomotor Activity:   walks slowly using a cane  Concentration:  Concentration: Good and Attention Span: Fair  Recall:  Good  Fund of Knowledge: Good  Language: Good  Assets:  Desire for Improvement Financial Resources/Insurance Housing Transportation  ADL's:  Intact  Cognition: WNL  Prognosis:  Good   PCP follows her labs.  DIAGNOSES:    ICD-10-CM   1. Schizoaffective disorder, unspecified type  F25.9     2. Insomnia due to other mental disorder  F51.05    F99     3. Generalized anxiety disorder  F41.1      Receiving Psychotherapy: No   RECOMMENDATIONS:  PDMP reviewed.  [place] filled 04/23/2022.  Oxycodone is known to me.  I provided 20 minutes of face to  face time during this encounter, including time spent before and after the visit in records review, medical decision making, counseling pertinent to today's visit, and charting.   We discussed the increased appetite and weight gain.  The Seroquel and Abilify may be causing increased hunger.  She may not need to be on both medications now.  We agreed to decrease Abilify, recheck in about 6 weeks and if she is doing okay we can wean her off of the Abilify completely.  Neither of us want to stop the Seroquel because it not only helps mood it helps her sleep as well.  Decrease Abilify to 10 mg to 1/2 pill every morning. Continue Wellbutrin XL 450 mg p.o. daily.  Continue Lamictal 100 mg, 1 p.o. every morning and 3 p.o. nightly. Continue Seroquel XR 300 mg, 2 p.o. nightly. Return in 6 weeks.  [place] Stell, PA-C  

## 2022-09-20 NOTE — Progress Notes (Deleted)
[facility]  Patient ID: Lori LevinsLinda S [name],  MRN: [id number]  PCP: Lori LevinsJohn, James W, MD  Date of Evaluation: 09/20/2022 Time spent:20 minutes  Chief Complaint:  Chief Complaint   Anxiety; Depression; Insomnia; Follow-up    HISTORY/CURRENT STATUS: HPI For routine med check.   Still gaining weight, even after decreasing the Seroquel at the last visit.  Her weight has been gradually creeping up for about 6 months now. Has been on Abilify and Seroquel for about a year now, was on Seroquel initially but Abilify was added after her friend died a few months before, she was very depressed, could not get out of bed, had anhedonia.  The Abilify did help her get through that period of time but neither of us are sure she needs it right now.  Patient is able to enjoy things.  Energy and motivation are good.  She has been doing things around the house like typical housework things, mopping, vacuuming etc.  She has also painted her bathroom, without asking anyone for help.  No extreme sadness, tearfulness, or feelings of hopelessness.  The Seroquel helps with sleep.  She is probably getting about 6 hours now.  She does wake up during the middle of the night sometimes but is able to go back to sleep.  ADLs and personal hygiene are normal.   Denies any changes in concentration, making decisions, or remembering things.  Appetite is increased, especially before bed.  No complaints of anxiety.  No panic attacks.  Denies suicidal or homicidal thoughts.  Patient denies increased energy with decreased need for sleep, increased talkativeness, racing thoughts, impulsivity or risky behaviors, increased spending, increased libido, grandiosity, increased irritability or anger, paranoia, or hallucinations.  Denies dizziness, syncope, seizures, numbness, tingling, tics, unsteady gait, slurred speech, confusion.  Has chronic back pain, uses a walking cane.  Denies unexplained weight loss, frequent infections, or sores  that heal slowly.  No polyphagia, polydipsia, or polyuria. Denies visual changes or paresthesias.   Individual Medical History/ Review of Systems: Changes? :No       Past medications for mental health diagnoses include: Ingrezza was ineffective, Lamictal, Risperdal, Zyprexa, Cogentin, Latuda, Cymbalta, lithium, Ambien, prazosin, Ativan, Wellbutrin, Zoloft, and probably others, trazodone possibly caused double vision and a fall. Seroquel, Gabapentin didn't help anxiety, Mirtazepine caused dizziness  Allergies: Ambien [zolpidem tartrate]; Anesthetics, halogenated; Hydrocodone; Lactose intolerance (gi); Nsaids; Propoxyphene n-acetaminophen; Tramadol; and Chantix [varenicline]  Current Medications:  Current Outpatient Medications:    albuterol (PROVENTIL) (2.5 MG/3ML) 0.083% nebulizer solution, , Disp: , Rfl:    aspirin 325 MG tablet, Take 325 mg by mouth daily., Disp: , Rfl:    atorvastatin (LIPITOR) 40 MG tablet, TAKE 1 TABLET BY MOUTH  DAILY, Disp: 100 tablet, Rfl: 2   bisacodyl (DULCOLAX) 5 MG EC tablet, Take by mouth., Disp: , Rfl:    buPROPion (WELLBUTRIN XL) 150 MG 24 hr tablet, TAKE 1 TABLET BY MOUTH DAILY  WITH 300 MG FOR TOTAL OF 450 MG, Disp: 90 tablet, Rfl: 3   buPROPion (WELLBUTRIN XL) 300 MG 24 hr tablet, TAKE 1 TABLET BY MOUTH DAILY  WITH 150 MG FOR TOTAL OF 450 MG, Disp: 90 tablet, Rfl: 3   cetirizine (ZYRTEC) 10 MG tablet, Take by mouth., Disp: , Rfl:    Cholecalciferol (VITAMIN D) 50 MCG (2000 UT) tablet, Take 2,000 Units by mouth daily., Disp: , Rfl:    diphenhydrAMINE (BENADRYL) 25 MG tablet, Take by mouth., Disp: , Rfl:    FLONASE ALLERGY RELIEF 50  MCG/ACT nasal spray, , Disp: , Rfl:    ibuprofen (ADVIL) 200 MG tablet, Take by mouth., Disp: , Rfl:    lamoTRIgine (LAMICTAL) 100 MG tablet, TAKE 1 TABLET BY MOUTH IN THE  MORNING AND 3 TABLETS BY MOUTH  AT BEDTIME, Disp: 400 tablet, Rfl: 2   methocarbamol (ROBAXIN) 500 MG tablet, 1.5 tablets Orally FOUR TIMES A DAY, Disp: , Rfl:     omeprazole (PRILOSEC) 40 MG capsule, Take 40 mg by mouth 2 (two) times daily., Disp: , Rfl:    Oxycodone HCl 10 MG TABS, Take 1 tablet by mouth every 6 (six) hours as needed (pain). , Disp: , Rfl:    potassium chloride (KLOR-CON M) 10 MEQ tablet, TAKE 1 TABLET BY MOUTH  DAILY, Disp: 90 tablet, Rfl: 2   Probiotic Product (PROBIOTIC DAILY PO), Take by mouth., Disp: , Rfl:    promethazine (PHENERGAN) 12.5 MG tablet, Take 1 tablet by mouth  every 6 hours as needed for nausea, Disp: 90 tablet, Rfl: 0   sucralfate (CARAFATE) 1 g tablet, TAKE ONE TABLET BY MOUTH FOUR TIMES DAILY, Disp: 120 tablet, Rfl: 0   vitamin B-12 (CYANOCOBALAMIN) 1000 MCG tablet, Take 1 tablet (1,000 mcg total) by mouth daily., Disp: 90 tablet, Rfl: 3   ARIPiprazole (ABILIFY) 10 MG tablet, Take 0.5 tablets (5 mg total) by mouth daily., Disp: 45 tablet, Rfl: 3   diphenhydramine-acetaminophen (TYLENOL PM) 25-500 MG TABS tablet, Take 1 tablet by mouth at bedtime as needed. (Patient not taking: Reported on 03/21/2022), Disp: , Rfl:    FLUCELVAX QUADRIVALENT SUSP injection, , Disp: , Rfl:    naloxone (NARCAN) 4 MG/0.1ML LIQD nasal spray kit, Place 0.4 mg into the nose as needed (opioid overdose).  (Patient not taking: Reported on 03/21/2022), Disp: , Rfl:    PFIZER COVID-19 VAC BIVALENT injection, , Disp: , Rfl:    QUEtiapine (SEROQUEL XR) 300 MG 24 hr tablet, Take 2 tablets (600 mg total) by mouth at bedtime., Disp: 60 tablet, Rfl: 11 Medication Side Effects: none   Family Medical/ Social History: Changes? no  MENTAL HEALTH EXAM:  There were no vitals taken for this visit.There is no height or weight on file to calculate BMI.   General Appearance: Casual, Well Groomed, and edentulous  Eye Contact:  Good  Speech:  Clear and Coherent and Normal Rate  Volume:  Normal  Mood:  Euthymic  Affect:  Congruent  Thought Process:  Goal Directed and Descriptions of Associations: Circumstantial  Orientation:  Full (Time, Place, and Person)   Thought Content: Logical   Suicidal Thoughts:  No  Homicidal Thoughts:  No  Memory:  Recent;   Fair Remote;   Good  Judgement:  Good  Insight:  Good  Psychomotor Activity:   walks slowly using a cane  Concentration:  Concentration: Good and Attention Span: Fair  Recall:  Good  Fund of Knowledge: Good  Language: Good  Assets:  Desire for Improvement Financial Resources/Insurance Housing Transportation  ADL's:  Intact  Cognition: WNL  Prognosis:  Good   PCP follows her labs.  DIAGNOSES:    ICD-10-CM   1. Schizoaffective disorder, unspecified type  F25.9     2. Insomnia due to other mental disorder  F51.05    F99     3. Generalized anxiety disorder  F41.1      Receiving Psychotherapy: No   RECOMMENDATIONS:  PDMP reviewed.  [place] filled 04/23/2022.  Oxycodone is known to me.  I provided 20 minutes of face to  face time during this encounter, including time spent before and after the visit in records review, medical decision making, counseling pertinent to today's visit, and charting.   We discussed the increased appetite and weight gain.  The Seroquel and Abilify may be causing increased hunger.  She may not need to be on both medications now.  We agreed to decrease Abilify, recheck in about 6 weeks and if she is doing okay we can wean her off of the Abilify completely.  Neither of us want to stop the Seroquel because it not only helps mood it helps her sleep as well.  Decrease Abilify to 10 mg to 1/2 pill every morning. Continue Wellbutrin XL 450 mg p.o. daily.  Continue Lamictal 100 mg, 1 p.o. every morning and 3 p.o. nightly. Continue Seroquel XR 300 mg, 2 p.o. nightly. Return in 6 weeks.  Melony Overlyeresa Missael Ferrari, PA-C

## 2022-09-25 DIAGNOSIS — M461 Sacroiliitis, not elsewhere classified: Secondary | ICD-10-CM | POA: Diagnosis not present

## 2022-09-25 DIAGNOSIS — M4726 Other spondylosis with radiculopathy, lumbar region: Secondary | ICD-10-CM | POA: Diagnosis not present

## 2022-09-25 DIAGNOSIS — M5136 Other intervertebral disc degeneration, lumbar region: Secondary | ICD-10-CM | POA: Diagnosis not present

## 2022-09-25 DIAGNOSIS — Z79891 Long term (current) use of opiate analgesic: Secondary | ICD-10-CM | POA: Diagnosis not present

## 2022-09-25 DIAGNOSIS — M961 Postlaminectomy syndrome, not elsewhere classified: Secondary | ICD-10-CM | POA: Diagnosis not present

## 2022-09-25 DIAGNOSIS — M542 Cervicalgia: Secondary | ICD-10-CM | POA: Diagnosis not present

## 2022-09-25 DIAGNOSIS — G894 Chronic pain syndrome: Secondary | ICD-10-CM | POA: Diagnosis not present

## 2022-10-15 DIAGNOSIS — M791 Myalgia, unspecified site: Secondary | ICD-10-CM | POA: Diagnosis not present

## 2022-10-15 DIAGNOSIS — M461 Sacroiliitis, not elsewhere classified: Secondary | ICD-10-CM | POA: Diagnosis not present

## 2022-10-16 ENCOUNTER — Ambulatory Visit: Payer: Medicare Other | Admitting: Internal Medicine

## 2022-10-21 ENCOUNTER — Ambulatory Visit: Payer: Medicare Other | Admitting: Internal Medicine

## 2022-10-31 ENCOUNTER — Ambulatory Visit: Payer: Medicare Other | Admitting: Physician Assistant

## 2022-11-01 ENCOUNTER — Ambulatory Visit: Payer: Medicare Other | Admitting: Physician Assistant

## 2022-11-01 DIAGNOSIS — K5903 Drug induced constipation: Secondary | ICD-10-CM | POA: Diagnosis not present

## 2022-11-01 DIAGNOSIS — R112 Nausea with vomiting, unspecified: Secondary | ICD-10-CM | POA: Diagnosis not present

## 2022-11-01 DIAGNOSIS — K219 Gastro-esophageal reflux disease without esophagitis: Secondary | ICD-10-CM | POA: Diagnosis not present

## 2022-11-01 DIAGNOSIS — Z86010 Personal history of colonic polyps: Secondary | ICD-10-CM | POA: Diagnosis not present

## 2022-11-01 DIAGNOSIS — R1031 Right lower quadrant pain: Secondary | ICD-10-CM | POA: Diagnosis not present

## 2022-11-01 DIAGNOSIS — R1310 Dysphagia, unspecified: Secondary | ICD-10-CM | POA: Diagnosis not present

## 2022-11-04 DIAGNOSIS — M461 Sacroiliitis, not elsewhere classified: Secondary | ICD-10-CM | POA: Diagnosis not present

## 2022-11-04 DIAGNOSIS — G894 Chronic pain syndrome: Secondary | ICD-10-CM | POA: Diagnosis not present

## 2022-11-04 DIAGNOSIS — M5136 Other intervertebral disc degeneration, lumbar region: Secondary | ICD-10-CM | POA: Diagnosis not present

## 2022-11-04 DIAGNOSIS — M961 Postlaminectomy syndrome, not elsewhere classified: Secondary | ICD-10-CM | POA: Diagnosis not present

## 2022-11-04 DIAGNOSIS — M542 Cervicalgia: Secondary | ICD-10-CM | POA: Diagnosis not present

## 2022-11-04 DIAGNOSIS — Z79891 Long term (current) use of opiate analgesic: Secondary | ICD-10-CM | POA: Diagnosis not present

## 2022-11-04 DIAGNOSIS — M4726 Other spondylosis with radiculopathy, lumbar region: Secondary | ICD-10-CM | POA: Diagnosis not present

## 2022-11-06 ENCOUNTER — Encounter: Payer: Self-pay | Admitting: Physician Assistant

## 2022-11-06 ENCOUNTER — Ambulatory Visit (INDEPENDENT_AMBULATORY_CARE_PROVIDER_SITE_OTHER): Payer: Medicare Other | Admitting: Physician Assistant

## 2022-11-06 VITALS — Wt 159.8 lb

## 2022-11-06 DIAGNOSIS — F5105 Insomnia due to other mental disorder: Secondary | ICD-10-CM

## 2022-11-06 DIAGNOSIS — F259 Schizoaffective disorder, unspecified: Secondary | ICD-10-CM

## 2022-11-06 DIAGNOSIS — T50905A Adverse effect of unspecified drugs, medicaments and biological substances, initial encounter: Secondary | ICD-10-CM

## 2022-11-06 DIAGNOSIS — F99 Mental disorder, not otherwise specified: Secondary | ICD-10-CM

## 2022-11-06 DIAGNOSIS — R635 Abnormal weight gain: Secondary | ICD-10-CM | POA: Diagnosis not present

## 2022-11-06 DIAGNOSIS — F411 Generalized anxiety disorder: Secondary | ICD-10-CM

## 2022-11-06 MED ORDER — METFORMIN HCL 500 MG PO TABS: ORAL_TABLET | ORAL | 1 refills | Status: DC

## 2022-11-06 NOTE — Progress Notes (Signed)
[facility]  Patient ID: [name],  MRN: [id number]  PCP: Corwin Levins, MD  Date of Evaluation: 11/06/2022 Time spent:30 minutes  Chief Complaint:  Chief Complaint   Depression; Insomnia    HISTORY/CURRENT STATUS: HPI  For routine med check.  Is sad, not really sure why, but thinks it might be d/t the increased hunger and wt gain.  Also yesterday was her friend, Connie's, birthday. She died a few years ago. She would have been 60.  Even though [name] is feeling down, it comes and goes and for the most part her mood is good.  She does not get out of the house very often though.  She just does not want to.  She is content at home though.  She does not cry easily.  ADLs and personal hygiene are normal.   Denies any changes in concentration, making decisions, or remembering things.  Anxiety is not usually a problem.  Denies suicidal or homicidal thoughts.  She has gained about 20 pounds in around the past 6 months.  States the Seroquel is the cause of this.  The hunger increases within an hour or so after she takes the Seroquel.  She starts craving sweets and usually gives in.  But the Seroquel really helps her mood and sleep.  She does not want to get off that if possible.   Patient denies increased energy with decreased need for sleep, increased talkativeness, racing thoughts, impulsivity or risky behaviors, increased spending, increased libido, grandiosity, increased irritability or anger, paranoia, or hallucinations.  Review of Systems  Constitutional: Negative.   HENT: Negative.    Eyes: Negative.   Respiratory: Negative.    Cardiovascular: Negative.   Gastrointestinal: Negative.   Genitourinary: Negative.   Musculoskeletal:  Positive for back pain, joint pain, myalgias and neck pain.       Chronic pain, she sees pain management.  Skin: Negative.   Neurological: Negative.   Endo/Heme/Allergies: Negative.   Psychiatric/Behavioral:         See HPI.    Individual Medical History/ Review of Systems: Changes? :No   Past medications for mental health diagnoses include: Ingrezza was ineffective, Lamictal, Risperdal, Zyprexa, Cogentin, Latuda, Cymbalta, lithium, Ambien, prazosin, Ativan, Wellbutrin, Zoloft, and probably others, trazodone possibly caused double vision and a fall. Seroquel, Gabapentin didn't help anxiety, Mirtazepine caused dizziness  Allergies: Ambien [zolpidem tartrate]; Anesthetics, halogenated; Hydrocodone; Lactose intolerance (gi); Nsaids; Propoxyphene n-acetaminophen; Tramadol; and Chantix [varenicline]  Current Medications:  Current Outpatient Medications:    albuterol (PROVENTIL) (2.5 MG/3ML) 0.083% nebulizer solution, , Disp: , Rfl:    ARIPiprazole (ABILIFY) 10 MG tablet, Take 0.5 tablets (5 mg total) by mouth daily., Disp: 45 tablet, Rfl: 3   aspirin 325 MG tablet, Take 325 mg by mouth daily., Disp: , Rfl:    atorvastatin (LIPITOR) 40 MG tablet, TAKE 1 TABLET BY MOUTH  DAILY, Disp: 100 tablet, Rfl: 2   bisacodyl (DULCOLAX) 5 MG EC tablet, Take by mouth., Disp: , Rfl:    buPROPion (WELLBUTRIN XL) 150 MG 24 hr tablet, TAKE 1 TABLET BY MOUTH DAILY  WITH 300 MG FOR TOTAL OF 450 MG, Disp: 90 tablet, Rfl: 3   buPROPion (WELLBUTRIN XL) 300 MG 24 hr tablet, TAKE 1 TABLET BY MOUTH DAILY  WITH 150 MG FOR TOTAL OF 450 MG, Disp: 90 tablet, Rfl: 3   cetirizine (ZYRTEC) 10 MG tablet, Take by mouth., Disp: , Rfl:    Cholecalciferol (VITAMIN D) 50 MCG (2000 UT) tablet, Take 2,000  Units by mouth daily., Disp: , Rfl:    diphenhydrAMINE (BENADRYL) 25 MG tablet, Take by mouth., Disp: , Rfl:    FLONASE ALLERGY RELIEF 50 MCG/ACT nasal spray, , Disp: , Rfl:    ibuprofen (ADVIL) 200 MG tablet, Take by mouth., Disp: , Rfl:    lamoTRIgine (LAMICTAL) 100 MG tablet, TAKE 1 TABLET BY MOUTH IN THE  MORNING AND 3 TABLETS BY MOUTH  AT BEDTIME, Disp: 400 tablet, Rfl: 2   metFORMIN (GLUCOPHAGE) 500 MG tablet, 1 po qd w/ food for 1 week, then increase  to 1 po bid w/ food., Disp: 60 tablet, Rfl: 1   omeprazole (PRILOSEC) 40 MG capsule, Take 40 mg by mouth 2 (two) times daily., Disp: , Rfl:    Oxycodone HCl 10 MG TABS, Take 1 tablet by mouth every 6 (six) hours as needed (pain). , Disp: , Rfl:    potassium chloride (KLOR-CON M) 10 MEQ tablet, TAKE 1 TABLET BY MOUTH  DAILY, Disp: 90 tablet, Rfl: 2   promethazine (PHENERGAN) 12.5 MG tablet, Take 1 tablet by mouth  every 6 hours as needed for nausea, Disp: 90 tablet, Rfl: 0   QUEtiapine (SEROQUEL XR) 300 MG 24 hr tablet, Take 2 tablets (600 mg total) by mouth at bedtime., Disp: 60 tablet, Rfl: 11   sucralfate (CARAFATE) 1 g tablet, TAKE ONE TABLET BY MOUTH FOUR TIMES DAILY, Disp: 120 tablet, Rfl: 0   vitamin B-12 (CYANOCOBALAMIN) 1000 MCG tablet, Take 1 tablet (1,000 mcg total) by mouth daily., Disp: 90 tablet, Rfl: 3   diphenhydramine-acetaminophen (TYLENOL PM) 25-500 MG TABS tablet, Take 1 tablet by mouth at bedtime as needed. (Patient not taking: Reported on 03/21/2022), Disp: , Rfl:    FLUCELVAX QUADRIVALENT SUSP injection, , Disp: , Rfl:    methocarbamol (ROBAXIN) 500 MG tablet, 1.5 tablets Orally FOUR TIMES A DAY (Patient not taking: Reported on 11/06/2022), Disp: , Rfl:    naloxone (NARCAN) 4 MG/0.1ML LIQD nasal spray kit, Place 0.4 mg into the nose as needed (opioid overdose).  (Patient not taking: Reported on 03/21/2022), Disp: , Rfl:    PFIZER COVID-19 VAC BIVALENT injection, , Disp: , Rfl:    Probiotic Product (PROBIOTIC DAILY PO), Take by mouth. (Patient not taking: Reported on 11/06/2022), Disp: , Rfl:  Medication Side Effects: weight gain  Family Medical/ Social History: Changes? No  MENTAL HEALTH EXAM:  Weight 159 lb 12.8 oz (72.5 kg).Body mass index is 27.43 kg/m.  General Appearance: Casual, Well Groomed, and Obese  Eye Contact:  Good  Speech:  Clear and Coherent and Normal Rate  Volume:  Normal  Mood:   sad  Affect:  Congruent  Thought Process:  Goal Directed and Descriptions  of Associations: Circumstantial  Orientation:  Full (Time, Place, and Person)  Thought Content: Logical   Suicidal Thoughts:  No  Homicidal Thoughts:  No  Memory:  WNL  Judgement:  Good  Insight:  Good  Psychomotor Activity:  Decreased and walks with a cane  Concentration:  Concentration: Good  Recall:  Good  Fund of Knowledge: Good  Language: Good  Assets:  Desire for Improvement Financial Resources/Insurance Housing Resilience  ADL's:  Intact  Cognition: WNL  Prognosis:  Good   PCP follows labs  06/08/2022 CMP glucose 123, all other values normal including BUN and creatinine  DIAGNOSES:    ICD-10-CM   1. Schizoaffective disorder, unspecified type (HCC)  F25.9     2. Weight gain due to medication  R63.5    T50.905A  3. Insomnia due to other mental disorder  F51.05    F99     4. Generalized anxiety disorder  F41.1      Receiving Psychotherapy: No   RECOMMENDATIONS:  PDMP reviewed.  Oxycodone known to me.  [place] filled 04/23/2022. I provided 30 minutes of face to face time during this encounter, including time spent before and after the visit in records review, medical decision making, counseling pertinent to today's visit, and charting.   We discussed the weight gain including ways to lose weight and/or prevent from putting all more weight.  She is already taking more walks during the day, so we will continue that.  Also eat less high carb foods.  We discussed what those are.  Cut out any sugary drinks.  Recommend adding metformin.  Benefits, risk and side effects were discussed and she would like to try it.  She has a physical exam next week with her PCP and will have labs drawn in.  I have asked her to have them send those records to me.  As far as her mental health goes her medications are still effective so no changes will be made.  Continue Abilify 10 mg, one half p.o. every morning. Continue Wellbutrin XL 450 mg daily. Continue Lamictal 100 mg, 1 p.o.  every morning and 3 p.o. nightly. Start metformin 500 mg, 1 p.o. daily for 1 week, then increase to 1 p.o. twice daily.  Take with food. Continue Seroquel XR 300 mg, 2 p.o. nightly. Continue vitamins as per med list. Return in 4 to 6 weeks.  Melony Overly, PA-C

## 2022-11-12 ENCOUNTER — Encounter: Payer: Self-pay | Admitting: Internal Medicine

## 2022-11-12 ENCOUNTER — Ambulatory Visit (INDEPENDENT_AMBULATORY_CARE_PROVIDER_SITE_OTHER): Payer: Medicare Other | Admitting: Internal Medicine

## 2022-11-12 VITALS — BP 132/74 | HR 83 | Temp 98.2°F | Ht 64.0 in | Wt 158.0 lb

## 2022-11-12 DIAGNOSIS — J309 Allergic rhinitis, unspecified: Secondary | ICD-10-CM | POA: Diagnosis not present

## 2022-11-12 DIAGNOSIS — J438 Other emphysema: Secondary | ICD-10-CM | POA: Diagnosis not present

## 2022-11-12 DIAGNOSIS — M461 Sacroiliitis, not elsewhere classified: Secondary | ICD-10-CM | POA: Insufficient documentation

## 2022-11-12 DIAGNOSIS — R739 Hyperglycemia, unspecified: Secondary | ICD-10-CM

## 2022-11-12 DIAGNOSIS — E559 Vitamin D deficiency, unspecified: Secondary | ICD-10-CM

## 2022-11-12 DIAGNOSIS — E7800 Pure hypercholesterolemia, unspecified: Secondary | ICD-10-CM

## 2022-11-12 DIAGNOSIS — Z0001 Encounter for general adult medical examination with abnormal findings: Secondary | ICD-10-CM

## 2022-11-12 DIAGNOSIS — E538 Deficiency of other specified B group vitamins: Secondary | ICD-10-CM | POA: Diagnosis not present

## 2022-11-12 LAB — CBC WITH DIFFERENTIAL/PLATELET
Basophils Absolute: 0.1 10*3/uL (ref 0.0–0.1)
Basophils Relative: 0.7 % (ref 0.0–3.0)
Eosinophils Absolute: 0.3 10*3/uL (ref 0.0–0.7)
Eosinophils Relative: 3.5 % (ref 0.0–5.0)
HCT: 36.6 % (ref 36.0–46.0)
Hemoglobin: 12 g/dL (ref 12.0–15.0)
Lymphocytes Relative: 22.5 % (ref 12.0–46.0)
Lymphs Abs: 1.8 10*3/uL (ref 0.7–4.0)
MCHC: 32.9 g/dL (ref 30.0–36.0)
MCV: 91.6 fl (ref 78.0–100.0)
Monocytes Absolute: 0.9 10*3/uL (ref 0.1–1.0)
Monocytes Relative: 11.4 % (ref 3.0–12.0)
Neutro Abs: 4.9 10*3/uL (ref 1.4–7.7)
Neutrophils Relative %: 61.9 % (ref 43.0–77.0)
Platelets: 486 10*3/uL — ABNORMAL HIGH (ref 150.0–400.0)
RBC: 4 Mil/uL (ref 3.87–5.11)
RDW: 15 % (ref 11.5–15.5)
WBC: 7.9 10*3/uL (ref 4.0–10.5)

## 2022-11-12 LAB — BASIC METABOLIC PANEL
BUN: 7 mg/dL (ref 6–23)
CO2: 28 mEq/L (ref 19–32)
Calcium: 9.3 mg/dL (ref 8.4–10.5)
Chloride: 93 mEq/L — ABNORMAL LOW (ref 96–112)
Creatinine, Ser: 0.84 mg/dL (ref 0.40–1.20)
GFR: 73.32 mL/min (ref >60.00)
Glucose, Bld: 85 mg/dL (ref 70–99)
Potassium: 4.6 mEq/L (ref 3.5–5.1)
Sodium: 129 mEq/L — ABNORMAL LOW (ref 135–145)

## 2022-11-12 LAB — HEPATIC FUNCTION PANEL
ALT: 18 U/L (ref 0–35)
AST: 15 U/L (ref 0–37)
Albumin: 3.9 g/dL (ref 3.5–5.2)
Alkaline Phosphatase: 73 U/L (ref 39–117)
Bilirubin, Direct: 0 mg/dL (ref 0.0–0.3)
Total Bilirubin: 0.3 mg/dL (ref 0.2–1.2)
Total Protein: 6.6 g/dL (ref 6.0–8.3)

## 2022-11-12 LAB — LIPID PANEL
Cholesterol: 144 mg/dL (ref 0–200)
HDL: 69.4 mg/dL (ref >39.00)
LDL Cholesterol: 57 mg/dL (ref 0–99)
NonHDL: 74.33
Total CHOL/HDL Ratio: 2
Triglycerides: 89 mg/dL (ref 0.0–149.0)
VLDL: 17.8 mg/dL (ref 0.0–40.0)

## 2022-11-12 LAB — VITAMIN D 25 HYDROXY (VIT D DEFICIENCY, FRACTURES): VITD: 30.6 ng/mL (ref 30.00–100.00)

## 2022-11-12 LAB — TSH: TSH: 5.19 u[IU]/mL (ref 0.35–5.50)

## 2022-11-12 LAB — HEMOGLOBIN A1C: Hgb A1c MFr Bld: 5.6 % (ref 4.6–6.5)

## 2022-11-12 LAB — VITAMIN B12: Vitamin B-12: 806 pg/mL (ref 211–911)

## 2022-11-12 NOTE — Patient Instructions (Signed)

## 2022-11-12 NOTE — Progress Notes (Signed)
Patient ID: [name], female   DOB: 27-Dec-1955, 67 y.o.   MRN: [id number]         Chief Complaint:: wellness exam and Medical Management of Chronic Issues (6 month follow up , been having mucus in throat for several months ) , copd, low b12 and Vit D, hld, hyperglycemia       HPI:  [name] is a 67 y.o. female here for wellness exam; up to date               Also Pt denies chest pain, increased sob or doe, wheezing, orthopnea, PND, increased LE swelling, palpitations, dizziness or syncope.   Pt denies polydipsia, polyuria, or new focal neuro s/s.    Pt denies fever, wt loss, night sweats, loss of appetite, or other constitutional symptoms  Does have several wks ongoing nasal allergy symptoms with clearish congestion, itch and sneezing, without fever, pain, ST, cough, swelling or wheezing.     Wt Readings from Last 3 Encounters:  11/12/22 158 lb (71.7 kg)  04/17/22 138 lb (62.6 kg)  02/11/22 138 lb (62.6 kg)   BP Readings from Last 3 Encounters:  11/12/22 132/74  04/17/22 120/74  10/17/21 128/76   Immunization History  Administered Date(s) Administered   Fluad Quad(high Dose 65+) 03/31/2015, 03/01/2016, 03/21/2018, 03/05/2019   H1N1 07/11/2008   Hpv-Unspecified 03/19/2022   Influenza Inj Mdck Quad Pf 03/22/2017   Influenza Inj Mdck Quad With Preservative 03/21/2018   Influenza Split 02/20/2012, 02/27/2014, 03/19/2022   Influenza Whole 05/21/2007, 04/15/2008, 03/16/2009, 02/21/2010   Influenza,inj,Quad PF,6+ Mos 03/31/2015, 03/01/2016   Influenza,inj,quad, With Preservative 03/21/2018, 03/05/2019   Influenza-Unspecified 02/20/2012, 01/15/2014, 02/27/2014, 03/31/2015, 03/01/2016, 03/21/2018, 03/05/2019, 04/17/2020, 03/15/2021   Moderna Sars-Covid-2 Vaccination 10/05/2019, 11/04/2019, 05/17/2020   Pfizer Covid-19 Vaccine Bivalent Booster 63yrs & up 03/01/2021   Pneumococcal Conjugate-13 03/19/2016   Pneumococcal Polysaccharide-23 05/01/2015, 08/08/2020    Pneumococcal-Unspecified 05/01/2015, 08/08/2020   Respiratory Syncytial Virus Vaccine,Recomb Aduvanted(Arexvy) 03/19/2022   Td 01/18/2008   Td (Adult), 2 Lf Tetanus Toxid, Preservative Free 01/18/2008   Tdap 11/26/2018   Zoster Recombinat (Shingrix) 10/17/2021, 11/09/2022  There are no preventive care reminders to display for this patient.    Past Medical History:  Diagnosis Date   Allergic rhinitis    Arthritis    Brain aneurysm    Chronic abdominal pain    COPD (chronic obstructive pulmonary disease) (HCC)    denies   Depression    bipolar   Fibromyalgia    Gastroparesis    GERD (gastroesophageal reflux disease)    H/O hiatal hernia    History of colonic polyps    HLD (hyperlipidemia)    IBS (irritable bowel syndrome)    chronic constipation   Migraines    OCD (obsessive compulsive disorder)    anxiety   Peripheral neuropathy    Past Surgical History:  Procedure Laterality Date   APPENDECTOMY     CERVICAL DISC SURGERY     cholecystectomy     coil to brain aneurysm     COLONOSCOPY     ELBOW SURGERY Bilateral    tendonitis   IR ANGIO INTRA EXTRACRAN SEL COM CAROTID INNOMINATE BILAT MOD SED  07/31/2017   IR ANGIO INTRA EXTRACRAN SEL COM CAROTID INNOMINATE BILAT MOD SED  12/30/2019   IR ANGIO VERTEBRAL SEL VERTEBRAL BILAT MOD SED  07/31/2017   IR ANGIO VERTEBRAL SEL VERTEBRAL BILAT MOD SED  12/30/2019   NECK SURGERY     OOPHORECTOMY  RADIOLOGY WITH ANESTHESIA N/A 05/10/2013   Procedure: RADIOLOGY WITH ANESTHESIA;  Surgeon: Oneal Grout, MD;  Location: [facility] OR;  Service: Radiology;  Laterality: N/A;   SHOULDER SURGERY Left    TOTAL ABDOMINAL HYSTERECTOMY     TUBAL LIGATION     VESICOVAGINAL FISTULA CLOSURE W/ TAH      reports that she has quit smoking. Her smoking use included cigarettes and e-cigarettes. She has a 2.60 pack-year smoking history. She has never used smokeless tobacco. She reports that she does not drink alcohol and does not use drugs. family  history includes Cancer in her father, mother, sister, and another family member; Coronary artery disease in an other family member; Diabetes in an other family member; Hypertension in an other family member; Leukemia in her sister; Seizures in an other family member. Allergies  Allergen Reactions   Ambien [Zolpidem Tartrate] Other (See Comments)    Memory issues   Anesthetics, Halogenated Nausea Only   Hydrocodone Nausea Only    Can take it if its in a cough syrup    Lactose Intolerance (Gi)     Bloating, upset stomach    Nsaids     GI irritation, pt tolerates meloxicam and diclofenac    Propoxyphene N-Acetaminophen Itching   Tramadol Nausea And Vomiting   Chantix [Varenicline] Other (See Comments)    "Makes bipolar worse" agitation, depression, sick to stomach   Current Outpatient Medications on File Prior to Visit  Medication Sig Dispense Refill   albuterol (PROVENTIL) (2.5 MG/3ML) 0.083% nebulizer solution      ARIPiprazole (ABILIFY) 10 MG tablet Take 0.5 tablets (5 mg total) by mouth daily. 45 tablet 3   aspirin 325 MG tablet Take 325 mg by mouth daily.     atorvastatin (LIPITOR) 40 MG tablet TAKE 1 TABLET BY MOUTH  DAILY 100 tablet 2   bisacodyl (DULCOLAX) 5 MG EC tablet Take by mouth.     buPROPion (WELLBUTRIN XL) 150 MG 24 hr tablet TAKE 1 TABLET BY MOUTH DAILY  WITH 300 MG FOR TOTAL OF 450 MG 90 tablet 3   buPROPion (WELLBUTRIN XL) 300 MG 24 hr tablet TAKE 1 TABLET BY MOUTH DAILY  WITH 150 MG FOR TOTAL OF 450 MG 90 tablet 3   cetirizine (ZYRTEC) 10 MG tablet Take by mouth.     Cholecalciferol (VITAMIN D) 50 MCG (2000 UT) tablet Take 2,000 Units by mouth daily.     diphenhydrAMINE (BENADRYL) 25 MG tablet Take by mouth.     diphenhydramine-acetaminophen (TYLENOL PM) 25-500 MG TABS tablet Take 1 tablet by mouth at bedtime as needed.     FLONASE ALLERGY RELIEF 50 MCG/ACT nasal spray      FLUCELVAX QUADRIVALENT SUSP injection      ibuprofen (ADVIL) 200 MG tablet Take by mouth.      lamoTRIgine (LAMICTAL) 100 MG tablet TAKE 1 TABLET BY MOUTH IN THE  MORNING AND 3 TABLETS BY MOUTH  AT BEDTIME 400 tablet 2   metFORMIN (GLUCOPHAGE) 500 MG tablet 1 po qd w/ food for 1 week, then increase to 1 po bid w/ food. 60 tablet 1   methocarbamol (ROBAXIN) 500 MG tablet      naloxone (NARCAN) 4 MG/0.1ML LIQD nasal spray kit Place 0.4 mg into the nose as needed (opioid overdose).     omeprazole (PRILOSEC) 40 MG capsule Take 40 mg by mouth 2 (two) times daily.     Oxycodone HCl 10 MG TABS Take 1 tablet by mouth every 6 (six) hours  as needed (pain).      PFIZER COVID-19 VAC BIVALENT injection      potassium chloride (KLOR-CON M) 10 MEQ tablet TAKE 1 TABLET BY MOUTH  DAILY 90 tablet 2   Probiotic Product (PROBIOTIC DAILY PO) Take by mouth.     promethazine (PHENERGAN) 12.5 MG tablet Take 1 tablet by mouth  every 6 hours as needed for nausea 90 tablet 0   QUEtiapine (SEROQUEL XR) 300 MG 24 hr tablet Take 2 tablets (600 mg total) by mouth at bedtime. 60 tablet 11   sucralfate (CARAFATE) 1 g tablet TAKE ONE TABLET BY MOUTH FOUR TIMES DAILY 120 tablet 0   vitamin B-12 (CYANOCOBALAMIN) 1000 MCG tablet Take 1 tablet (1,000 mcg total) by mouth daily. 90 tablet 3   No current facility-administered medications on file prior to visit.        ROS:  All others reviewed and negative.  Objective        PE:  BP 132/74 (BP Location: Right Arm, Patient Position: Sitting, Cuff Size: Normal)   Pulse 83   Temp 98.2 F (36.8 C) (Oral)   Ht 5\' 4"  (1.626 m)   Wt 158 lb (71.7 kg)   SpO2 98%   BMI 27.12 kg/m                 Constitutional: Pt appears in NAD               HENT: Head: NCAT.                Right Ear: External ear normal.                 Left Ear: External ear normal.                Eyes: . Pupils are equal, round, and reactive to light. Conjunctivae and EOM are normal               Nose: without d/c or deformity               Neck: Neck supple. Gross normal ROM                Cardiovascular: Normal rate and regular rhythm.                 Pulmonary/Chest: Effort normal and breath sounds without rales or wheezing.                Abd:  Soft, NT, ND, + BS, no organomegaly               Neurological: Pt is alert. At baseline orientation, motor grossly intact               Skin: Skin is warm. No rashes, no other new lesions, LE edema - none               Psychiatric: Pt behavior is normal without agitation   Micro: none  Cardiac tracings I have personally interpreted today:  none  Pertinent Radiological findings (summarize): none   Lab Results  Component Value Date   WBC 7.5 07/16/2021   HGB 13.8 07/16/2021   HCT 42.0 07/16/2021   PLT 346.0 07/16/2021   GLUCOSE 103 (H) 07/16/2021   CHOL 128 07/16/2021   TRIG 60.0 07/16/2021   HDL 56.70 07/16/2021   LDLDIRECT 102.7 08/21/2011   LDLCALC 59 07/16/2021   ALT 13 07/16/2021   AST 11 07/16/2021   NA 133 (L)  07/16/2021   K 3.9 07/16/2021   CL 98 07/16/2021   CREATININE 0.86 07/16/2021   BUN 11 07/16/2021   CO2 29 07/16/2021   TSH 2.07 07/16/2021   INR 0.9 12/30/2019   HGBA1C 4.8 07/16/2021   Assessment/Plan:  [name] is a 67 y.o. White or Caucasian [1] female with  has a past medical history of Allergic rhinitis, Arthritis, Brain aneurysm, Chronic abdominal pain, COPD (chronic obstructive pulmonary disease) (HCC), Depression, Fibromyalgia, Gastroparesis, GERD (gastroesophageal reflux disease), H/O hiatal hernia, History of colonic polyps, HLD (hyperlipidemia), IBS (irritable bowel syndrome), Migraines, OCD (obsessive compulsive disorder), and Peripheral neuropathy.  COPD (chronic obstructive pulmonary disease) Stable, cont inhaler prn  Encounter for well adult exam with abnormal findings Age and sex appropriate education and counseling updated with regular exercise and diet Referrals for preventative services - none needed Immunizations addressed - none needed Smoking counseling  - none  needed Evidence for depression or other mood disorder - none significant Most recent labs reviewed. I have personally reviewed and have noted: 1) the patient's medical and social history 2) The patient's current medications and supplements 3) The patient's height, weight, and BMI have been recorded in the chart   B12 deficiency Lab Results  Component Value Date   VITAMINB12 >1504 (H) 07/16/2021   Stable, cont oral replacement - b12 1000 mcg qd   Vitamin D deficiency Last vitamin D Lab Results  Component Value Date   VD25OH 44.86 07/16/2021   Stable, cont oral replacement   HLD (hyperlipidemia) Lab Results  Component Value Date   LDLCALC 59 07/16/2021   Stable, pt to continue current statin lipitor 40 mg qd   Hyperglycemia Lab Results  Component Value Date   HGBA1C 4.8 07/16/2021   Stable, pt to continue current medical treatment metfomrin 500 d   Allergic rhinitis Mild to mod flare, for restart flonase asd,  to f/u any worsening symptoms or concerns Followup: Return in about 6 months (around 05/15/2023).  Oliver Barre, MD 11/12/2022 7:17 PM [facility]  Primary Care - [facility]

## 2022-11-12 NOTE — Assessment & Plan Note (Signed)
Stable , cont inhaler prn 

## 2022-11-12 NOTE — Assessment & Plan Note (Signed)
Lab Results  ?Component Value Date  ? VITAMINB12 >1504 (H) 07/16/2021  ? ?Stable, cont oral replacement - b12 1000 mcg qd ? ?

## 2022-11-12 NOTE — Assessment & Plan Note (Signed)
Lab Results  Component Value Date   HGBA1C 4.8 07/16/2021   Stable, pt to continue current medical treatment metfomrin 500 d

## 2022-11-12 NOTE — Assessment & Plan Note (Signed)
Lab Results  Component Value Date   LDLCALC 59 07/16/2021   Stable, pt to continue current statin lipitor 40 mg qd  

## 2022-11-12 NOTE — Assessment & Plan Note (Signed)

## 2022-11-12 NOTE — Assessment & Plan Note (Signed)
Last vitamin D ?Lab Results  ?Component Value Date  ? VD25OH 44.86 07/16/2021  ? ?Stable, cont oral replacement ? ?

## 2022-11-12 NOTE — Assessment & Plan Note (Signed)
Mild to mod flare, for restart flonase asd,  to f/u any worsening symptoms or concerns

## 2022-11-13 LAB — URINALYSIS, ROUTINE W REFLEX MICROSCOPIC
Bilirubin Urine: NEGATIVE
Ketones, ur: NEGATIVE
Leukocytes,Ua: NEGATIVE
Nitrite: NEGATIVE
Specific Gravity, Urine: <=1.005 — AB (ref 1.000–1.030)
Total Protein, Urine: NEGATIVE
Urine Glucose: NEGATIVE
Urobilinogen, UA: 0.2 (ref 0.0–1.0)
WBC, UA: NONE SEEN (ref 0–?)
pH: 6 (ref 5.0–8.0)

## 2022-11-13 NOTE — Progress Notes (Signed)
The test results show that your current treatment is OK, as the tests are stable.  Please continue the same plan.  There is no other need for change of treatment or further evaluation based on these results, at this time.  thanks 

## 2022-11-15 ENCOUNTER — Telehealth: Payer: Self-pay | Admitting: Physician Assistant

## 2022-11-15 NOTE — Telephone Encounter (Signed)
Pt called at 9:45a to advise Rosey Bath that her blood work results were available.  She said Rosey Bath did not need to call her back unless there is something she needs to do or know about the results.

## 2022-11-15 NOTE — Telephone Encounter (Signed)
Patient reporting her lab results have returned.

## 2022-11-18 NOTE — Telephone Encounter (Signed)
Labs were reviewed. Vitamin D is low normal, I'll disc w/ her at appt in 2 weeks.

## 2022-11-21 DIAGNOSIS — K449 Diaphragmatic hernia without obstruction or gangrene: Secondary | ICD-10-CM | POA: Diagnosis not present

## 2022-11-21 DIAGNOSIS — R112 Nausea with vomiting, unspecified: Secondary | ICD-10-CM | POA: Diagnosis not present

## 2022-11-21 DIAGNOSIS — K219 Gastro-esophageal reflux disease without esophagitis: Secondary | ICD-10-CM | POA: Diagnosis not present

## 2022-11-21 DIAGNOSIS — R192 Visible peristalsis: Secondary | ICD-10-CM | POA: Diagnosis not present

## 2022-11-21 DIAGNOSIS — R1310 Dysphagia, unspecified: Secondary | ICD-10-CM | POA: Diagnosis not present

## 2022-11-26 ENCOUNTER — Telehealth: Payer: Self-pay | Admitting: Licensed Clinical Social Worker

## 2022-11-26 NOTE — Patient Outreach (Signed)
  Care Coordination   11/26/2022 Name: [name] MRN: [id number] DOB: 04/10/1956   Care Coordination Outreach Attempts:  An unsuccessful telephone outreach was attempted today to offer the patient information about available care coordination services.  Follow Up Plan:  Additional outreach attempts will be made to offer the patient care coordination information and services.   Encounter Outcome:  No Answer   Care Coordination Interventions:  No, not indicated    Sammuel Hines, LCSW Social Work Care Coordination  [facility] [employer] [phone]

## 2022-11-29 ENCOUNTER — Ambulatory Visit: Payer: Self-pay | Admitting: Licensed Clinical Social Worker

## 2022-11-29 NOTE — Patient Instructions (Signed)
Social Work Visit Information  Thank you for taking time to visit with me today. Please don't hesitate to contact me if I can be of assistance to you.   Following are the goals we discussed today:   Goals Addressed             This Visit's Progress    COMPLETED: Care Coordination Activies       Activities and task to complete in order to accomplish goals.   Keep all upcoming appointment discussed today Continue with compliance of taking medication prescribed by Doctor Complete Advance Directive packet,  Have advance directive notarized and provide a copy to provider office  Review EMMI educational information on Advance Directive. Look for an e-mail from [facility].         Patient does not desire continued follow-up by social work. They will contact the office if needed  Please call the care guide team at [phone] if you need to cancel or reschedule your appointment.   If you or anyone you know are experiencing a Mental Health or Behavioral Health Crisis or need someone to talk to, please call the Suicide and Crisis Lifeline: 988 call the [place] National Suicide Prevention Lifeline: (220) 335-4199 or TTY: 867-858-3798 TTY 765-039-8286) to talk to a trained counselor call 1-800-273-TALK (toll free, 24 hour hotline) go to [facility] [address] [place] [phone])   Patient verbalizes understanding of instructions and care plan provided today and agrees to view in MyChart. Active MyChart status and patient understanding of how to access instructions and care plan via MyChart confirmed with patient.     Sammuel Hines, LCSW Social Work Care Coordination  [facility] [employer] [phone]

## 2022-11-29 NOTE — Patient Outreach (Signed)
  Care Coordination  Initial Visit Note   11/29/2022 Name: [name] MRN: [id number] DOB: 03/02/1956  [name] is a 67 y.o. year old female who sees Corwin Levins, MD for primary care. I spoke with  [name] by phone today.  What matters to the patients health and wellness today?  Managing her health  Patient reports no concerns or needs from Care Coordination team with health and wellness related to physical or mental heath. Is currently followed by [facility] for all mental health needs. Will work on advance directives.    Goals Addressed             This Visit's Progress    COMPLETED: Care Coordination Activies       Activities and task to complete in order to accomplish goals.   Keep all upcoming appointment discussed today Continue with compliance of taking medication prescribed by Doctor Complete Advance Directive packet,  Have advance directive notarized and provide a copy to provider office  Review EMMI educational information on Advance Directive. Look for an e-mail from [facility].        SDOH assessments and interventions completed:  Yes  SDOH Interventions Today    Flowsheet Row Most Recent Value  SDOH Interventions   Food Insecurity Interventions Intervention Not Indicated  Housing Interventions Intervention Not Indicated  Transportation Interventions Patient Resources (Friends/Family)  Utilities Interventions Intervention Not Indicated  Financial Strain Interventions Intervention Not Indicated       Care Coordination Interventions:  Yes, provided  Interventions Today    Flowsheet Row Most Recent Value  Chronic Disease   Chronic disease during today's visit Hypertension (HTN), Chronic Obstructive Pulmonary Disease (COPD)  General Interventions   General Interventions Discussed/Reviewed General Interventions Discussed  [reviewed all upcoming appointments]  Education Interventions   Education Provided Provided Education   [on Care Coordination program]  Mental Health Interventions   Mental Health Discussed/Reviewed Mental Health Discussed  [currently being treated by [facility]]  Pharmacy Interventions   Pharmacy Dicussed/Reviewed Pharmacy Topics Reviewed  [no needs identified]  Advanced Directive Interventions   Advanced Directives Discussed/Reviewed Advanced Directives Discussed, Provided resource for acquiring and filling out documents, Advanced Care Planning  [documents mailed]       Follow up plan: No further intervention required.   Encounter Outcome:  Pt. Visit Completed   Sammuel Hines, LCSW Social Work Care Coordination  [facility] [employer] [employer] [phone]

## 2022-12-01 ENCOUNTER — Other Ambulatory Visit: Payer: Self-pay

## 2022-12-01 MED ORDER — METFORMIN HCL 500 MG PO TABS: ORAL_TABLET | ORAL | 0 refills | Status: DC

## 2022-12-04 ENCOUNTER — Ambulatory Visit (INDEPENDENT_AMBULATORY_CARE_PROVIDER_SITE_OTHER): Payer: Medicare Other | Admitting: Physician Assistant

## 2022-12-10 DIAGNOSIS — G894 Chronic pain syndrome: Secondary | ICD-10-CM | POA: Diagnosis not present

## 2022-12-10 DIAGNOSIS — Z79891 Long term (current) use of opiate analgesic: Secondary | ICD-10-CM | POA: Diagnosis not present

## 2022-12-17 ENCOUNTER — Ambulatory Visit (INDEPENDENT_AMBULATORY_CARE_PROVIDER_SITE_OTHER): Payer: Medicare Other | Admitting: Physician Assistant

## 2022-12-17 ENCOUNTER — Encounter: Payer: Self-pay | Admitting: Physician Assistant

## 2022-12-17 VITALS — Wt 155.2 lb

## 2022-12-17 DIAGNOSIS — F411 Generalized anxiety disorder: Secondary | ICD-10-CM

## 2022-12-17 DIAGNOSIS — F99 Mental disorder, not otherwise specified: Secondary | ICD-10-CM

## 2022-12-17 DIAGNOSIS — F259 Schizoaffective disorder, unspecified: Secondary | ICD-10-CM

## 2022-12-17 DIAGNOSIS — F5105 Insomnia due to other mental disorder: Secondary | ICD-10-CM

## 2022-12-17 DIAGNOSIS — R635 Abnormal weight gain: Secondary | ICD-10-CM

## 2022-12-17 MED ORDER — QUETIAPINE FUMARATE ER 300 MG PO TB24: 600.0000 mg | ORAL_TABLET | Freq: Every day | ORAL | 3 refills | Status: DC

## 2022-12-17 NOTE — Progress Notes (Signed)
[facility]  Patient ID: [name],  MRN: [id number]  PCP: Corwin Levins, MD  Date of Evaluation: 12/17/2022 Time spent: 22 minutes  Chief Complaint:  Chief Complaint   Follow-up    HISTORY/CURRENT STATUS: HPI  For routine med check.  States she is feeling pretty good.  She is really anxious because she agreed to go to her son's house to pet sit while he and his family are on vacation later on this month.  She is already stressing about it but knows she will be fine once she is there.  She is not having panic attacks and is generally not anxious as long as she is in her comfort zone.  Energy and motivation are good.  She's lost about 4 # since we added Metformin. Appetite has decreased. No GI or any other side effects.   Energy and motivation are good.  No extreme sadness, tearfulness, or feelings of hopelessness.  Sleeps well most of the time. ADLs and personal hygiene are normal.   Denies any changes in memory.  Denies suicidal or homicidal thoughts.  Patient denies increased energy with decreased need for sleep, increased talkativeness, racing thoughts, impulsivity or risky behaviors, increased spending, increased libido, grandiosity, increased irritability or anger, paranoia, or hallucinations.  Denies dizziness, syncope, seizures, numbness, tingling, tremor, tics, unsteady gait, slurred speech, confusion.  Has chronic multi joint pain, uses a cane when she is outside her home.  Individual Medical History/ Review of Systems: Changes? :No   Past medications for mental health diagnoses include: Ingrezza was ineffective, Lamictal, Risperdal, Zyprexa, Cogentin, Latuda, Cymbalta, lithium, Ambien, prazosin, Ativan, Wellbutrin, Zoloft, and probably others, trazodone possibly caused double vision and a fall. Seroquel, Gabapentin didn't help anxiety, Mirtazepine caused dizziness  Allergies: Ambien [zolpidem tartrate]; Anesthetics, halogenated; Hydrocodone; Lactose intolerance  (gi); Nsaids; Propoxyphene n-acetaminophen; Tramadol; and Chantix [varenicline]  Current Medications:  Current Outpatient Medications:    albuterol (PROVENTIL) (2.5 MG/3ML) 0.083% nebulizer solution, , Disp: , Rfl:    ARIPiprazole (ABILIFY) 10 MG tablet, Take 0.5 tablets (5 mg total) by mouth daily., Disp: 45 tablet, Rfl: 3   aspirin 325 MG tablet, Take 325 mg by mouth daily., Disp: , Rfl:    atorvastatin (LIPITOR) 40 MG tablet, TAKE 1 TABLET BY MOUTH  DAILY, Disp: 100 tablet, Rfl: 2   bisacodyl (DULCOLAX) 5 MG EC tablet, Take by mouth., Disp: , Rfl:    buPROPion (WELLBUTRIN XL) 150 MG 24 hr tablet, TAKE 1 TABLET BY MOUTH DAILY  WITH 300 MG FOR TOTAL OF 450 MG, Disp: 90 tablet, Rfl: 3   buPROPion (WELLBUTRIN XL) 300 MG 24 hr tablet, TAKE 1 TABLET BY MOUTH DAILY  WITH 150 MG FOR TOTAL OF 450 MG, Disp: 90 tablet, Rfl: 3   cetirizine (ZYRTEC) 10 MG tablet, Take by mouth., Disp: , Rfl:    Cholecalciferol (VITAMIN D) 50 MCG (2000 UT) tablet, Take 2,000 Units by mouth daily., Disp: , Rfl:    diphenhydrAMINE (BENADRYL) 25 MG tablet, Take by mouth., Disp: , Rfl:    diphenhydramine-acetaminophen (TYLENOL PM) 25-500 MG TABS tablet, Take 1 tablet by mouth at bedtime as needed., Disp: , Rfl:    FLONASE ALLERGY RELIEF 50 MCG/ACT nasal spray, , Disp: , Rfl:    FLUCELVAX QUADRIVALENT SUSP injection, , Disp: , Rfl:    ibuprofen (ADVIL) 200 MG tablet, Take by mouth., Disp: , Rfl:    lamoTRIgine (LAMICTAL) 100 MG tablet, TAKE 1 TABLET BY MOUTH IN THE  MORNING AND 3 TABLETS  BY MOUTH  AT BEDTIME, Disp: 400 tablet, Rfl: 2   metFORMIN (GLUCOPHAGE) 500 MG tablet, Take 1 po bid w/ food., Disp: 180 tablet, Rfl: 0   methocarbamol (ROBAXIN) 500 MG tablet, , Disp: , Rfl:    omeprazole (PRILOSEC) 40 MG capsule, Take 40 mg by mouth 2 (two) times daily., Disp: , Rfl:    Oxycodone HCl 10 MG TABS, Take 1 tablet by mouth every 6 (six) hours as needed (pain). , Disp: , Rfl:    PFIZER COVID-19 VAC BIVALENT injection, , Disp: ,  Rfl:    potassium chloride (KLOR-CON M) 10 MEQ tablet, TAKE 1 TABLET BY MOUTH  DAILY, Disp: 90 tablet, Rfl: 2   Probiotic Product (PROBIOTIC DAILY PO), Take by mouth., Disp: , Rfl:    promethazine (PHENERGAN) 12.5 MG tablet, Take 1 tablet by mouth  every 6 hours as needed for nausea, Disp: 90 tablet, Rfl: 0   sucralfate (CARAFATE) 1 g tablet, TAKE ONE TABLET BY MOUTH FOUR TIMES DAILY, Disp: 120 tablet, Rfl: 0   vitamin B-12 (CYANOCOBALAMIN) 1000 MCG tablet, Take 1 tablet (1,000 mcg total) by mouth daily., Disp: 90 tablet, Rfl: 3   naloxone (NARCAN) 4 MG/0.1ML LIQD nasal spray kit, Place 0.4 mg into the nose as needed (opioid overdose). (Patient not taking: Reported on 12/17/2022), Disp: , Rfl:    QUEtiapine (SEROQUEL XR) 300 MG 24 hr tablet, Take 2 tablets (600 mg total) by mouth at bedtime., Disp: 180 tablet, Rfl: 3 Medication Side Effects: weight gain  Family Medical/ Social History: Changes? No  MENTAL HEALTH EXAM:  Weight 155 lb 3.2 oz (70.4 kg).Body mass index is 26.64 kg/m.  General Appearance: Casual, Well Groomed, and Obese  Eye Contact:  Good  Speech:  Clear and Coherent and Normal Rate  Volume:  Normal  Mood:  Euthymic  Affect:  Congruent  Thought Process:  Goal Directed and Descriptions of Associations: Circumstantial  Orientation:  Full (Time, Place, and Person)  Thought Content: Logical   Suicidal Thoughts:  No  Homicidal Thoughts:  No  Memory:  WNL  Judgement:  Good  Insight:  Good  Psychomotor Activity:  Decreased and walks with a cane  Concentration:  Concentration: Good  Recall:  Good  Fund of Knowledge: Good  Language: Good  Assets:  Desire for Improvement Financial Resources/Insurance Housing Resilience  ADL's:  Intact  Cognition: WNL  Prognosis:  Good   Labs 11/12/2022 CBC w/ diff nl, plt 486 BMP  Na 129 Kidney functions nl LFTs nl Lipids all nl TSH  5.19 Hgb A1C 5.6 Vitamin D 30.6 B12 806  DIAGNOSES:    ICD-10-CM   1. Schizoaffective disorder,  unspecified type (HCC)  F25.9     2. Weight gain due to medication  R63.5    T50.905A     3. Generalized anxiety disorder  F41.1     4. Insomnia due to other mental disorder  F51.05    F99       Receiving Psychotherapy: No   RECOMMENDATIONS:  PDMP reviewed.  Oxycodone known to me.  [place] filled 04/23/2022. I provided 22 minutes of face to face time during this encounter, including time spent before and after the visit in records review, medical decision making, counseling pertinent to today's visit, and charting.   I am glad she is doing well as far as the metformin is concerned.  It does seem to be helping her some. Her medications are working well so no changes will be made.  Continue Abilify  10 mg, one half p.o. every morning. Continue Wellbutrin XL 450 mg daily. Continue Lamictal 100 mg, 1 p.o. every morning and 3 p.o. nightly. Continue metformin 500 mg, 1 p.o. twice daily.  Take with food. Continue Seroquel XR 300 mg, 2 p.o. nightly. Continue vitamins as per med list. Return in 3 months.  Melony Overly, PA-C

## 2023-01-07 DIAGNOSIS — R911 Solitary pulmonary nodule: Secondary | ICD-10-CM | POA: Diagnosis not present

## 2023-01-07 DIAGNOSIS — R918 Other nonspecific abnormal finding of lung field: Secondary | ICD-10-CM | POA: Diagnosis not present

## 2023-01-07 DIAGNOSIS — K449 Diaphragmatic hernia without obstruction or gangrene: Secondary | ICD-10-CM | POA: Diagnosis not present

## 2023-01-07 DIAGNOSIS — I2510 Atherosclerotic heart disease of native coronary artery without angina pectoris: Secondary | ICD-10-CM | POA: Diagnosis not present

## 2023-01-07 DIAGNOSIS — J984 Other disorders of lung: Secondary | ICD-10-CM | POA: Diagnosis not present

## 2023-01-20 DIAGNOSIS — M4726 Other spondylosis with radiculopathy, lumbar region: Secondary | ICD-10-CM | POA: Diagnosis not present

## 2023-01-20 DIAGNOSIS — M961 Postlaminectomy syndrome, not elsewhere classified: Secondary | ICD-10-CM | POA: Diagnosis not present

## 2023-01-20 DIAGNOSIS — M5136 Other intervertebral disc degeneration, lumbar region: Secondary | ICD-10-CM | POA: Diagnosis not present

## 2023-01-20 DIAGNOSIS — Z79891 Long term (current) use of opiate analgesic: Secondary | ICD-10-CM | POA: Diagnosis not present

## 2023-01-20 DIAGNOSIS — M25552 Pain in left hip: Secondary | ICD-10-CM | POA: Diagnosis not present

## 2023-01-20 DIAGNOSIS — M461 Sacroiliitis, not elsewhere classified: Secondary | ICD-10-CM | POA: Diagnosis not present

## 2023-01-20 DIAGNOSIS — M542 Cervicalgia: Secondary | ICD-10-CM | POA: Diagnosis not present

## 2023-01-20 DIAGNOSIS — M791 Myalgia, unspecified site: Secondary | ICD-10-CM | POA: Diagnosis not present

## 2023-01-20 DIAGNOSIS — G894 Chronic pain syndrome: Secondary | ICD-10-CM | POA: Diagnosis not present

## 2023-01-23 DIAGNOSIS — M25852 Other specified joint disorders, left hip: Secondary | ICD-10-CM | POA: Diagnosis not present

## 2023-02-03 ENCOUNTER — Other Ambulatory Visit: Payer: Self-pay | Admitting: Internal Medicine

## 2023-02-03 DIAGNOSIS — Z1231 Encounter for screening mammogram for malignant neoplasm of breast: Secondary | ICD-10-CM

## 2023-02-26 ENCOUNTER — Other Ambulatory Visit: Payer: Self-pay

## 2023-02-26 ENCOUNTER — Other Ambulatory Visit: Payer: Self-pay | Admitting: Internal Medicine

## 2023-03-04 ENCOUNTER — Other Ambulatory Visit: Payer: Self-pay | Admitting: Physician Assistant

## 2023-03-06 ENCOUNTER — Ambulatory Visit (INDEPENDENT_AMBULATORY_CARE_PROVIDER_SITE_OTHER): Payer: Medicare Other

## 2023-03-06 VITALS — Ht 64.0 in | Wt 144.0 lb

## 2023-03-06 DIAGNOSIS — Z0000 Encounter for general adult medical examination without abnormal findings: Secondary | ICD-10-CM | POA: Diagnosis not present

## 2023-03-06 DIAGNOSIS — Z5941 Food insecurity: Secondary | ICD-10-CM | POA: Diagnosis not present

## 2023-03-06 DIAGNOSIS — Z599 Problem related to housing and economic circumstances, unspecified: Secondary | ICD-10-CM | POA: Diagnosis not present

## 2023-03-06 NOTE — Patient Instructions (Signed)
Ms. Beagley , Thank you for taking time to come for your Medicare Wellness Visit. I appreciate your ongoing commitment to your health goals. Please review the following plan we discussed and let me know if I can assist you in the future.   Referrals/Orders/Follow-Ups/Clinician Recommendations: No  This is a list of the screening recommended for you and due dates:  Health Maintenance  Topic Date Due   Flu Shot  01/16/2023   Mammogram  03/08/2023   Medicare Annual Wellness Visit  03/05/2024   Colon Cancer Screening  05/01/2025   DTaP/Tdap/Td vaccine (3 - Td or Tdap) 11/25/2028   Hepatitis C Screening  Completed   HIV Screening  Completed   Zoster (Shingles) Vaccine  Completed   HPV Vaccine  Aged Out   COVID-19 Vaccine  Discontinued   Advanced directives: Patient has the paperwork, not completed as of date.  Next Medicare Annual Wellness Visit scheduled for next year: Yes  *insert Preventive Care Attachment Reference

## 2023-03-06 NOTE — Progress Notes (Signed)
Subjective:   [name] is a 67 y.o. female who presents for Medicare Annual (Subsequent) preventive examination.  Visit Complete: Virtual  I connected with  [name] on 03/06/23 by a audio enabled telemedicine application and verified that I am speaking with the correct person using two identifiers.  Patient Location: Home  Provider Location: Office/Clinic  I discussed the limitations of evaluation and management by telemedicine. The patient expressed understanding and agreed to proceed.  Vital Signs: Because this visit was a virtual/telehealth visit, some criteria may be missing or patient reported. Any vitals not documented were not able to be obtained and vitals that have been documented are patient reported.   Patient provided his/her weight during this virtual phone visit.  Cardiac Risk Factors include: advanced age (>42men, >12 women);dyslipidemia;family history of premature cardiovascular disease;hypertension     Objective:    Today's Vitals   03/06/23 1602  Weight: 144 lb (65.3 kg)  Height: 5\' 4"  (1.626 m)  PainSc: 7   PainLoc: Back   Body mass index is 24.72 kg/m.     03/06/2023    4:10 PM 02/11/2022   11:13 AM 02/09/2021   11:15 AM 12/30/2019    6:48 AM 12/17/2019    1:20 PM 07/31/2017    6:57 AM 05/10/2013    2:00 PM  Advanced Directives  Does Patient Have a Medical Advance Directive? No No No No No No Patient does not have advance directive  Would patient like information on creating a medical advance directive? No - Patient declined No - Patient declined No - Patient declined No - Patient declined No - Patient declined    Pre-existing out of facility DNR order (yellow form or pink MOST form)       No    Current Medications (verified) Outpatient Encounter Medications as of 03/06/2023  Medication Sig   aspirin 325 MG tablet Take 325 mg by mouth daily.   atorvastatin (LIPITOR) 40 MG tablet TAKE 1 TABLET BY MOUTH  DAILY   bisacodyl (DULCOLAX) 5 MG  EC tablet Take by mouth.   buPROPion (WELLBUTRIN XL) 150 MG 24 hr tablet TAKE 1 TABLET BY MOUTH DAILY  WITH 300 MG FOR TOTAL OF 450 MG   buPROPion (WELLBUTRIN XL) 300 MG 24 hr tablet TAKE 1 TABLET BY MOUTH DAILY  WITH 150 MG FOR TOTAL OF 450 MG   diphenhydrAMINE (BENADRYL) 25 MG tablet Take by mouth.   FLONASE ALLERGY RELIEF 50 MCG/ACT nasal spray    ibuprofen (ADVIL) 200 MG tablet Take by mouth.   lamoTRIgine (LAMICTAL) 100 MG tablet TAKE 1 TABLET BY MOUTH IN THE  MORNING AND 3 TABLETS BY MOUTH  AT BEDTIME   metFORMIN (GLUCOPHAGE) 500 MG tablet Take 1 tablet by mouth twice daily with food   omeprazole (PRILOSEC) 40 MG capsule Take 40 mg by mouth 2 (two) times daily.   Oxycodone HCl 10 MG TABS Take 1 tablet by mouth every 6 (six) hours as needed (pain).    potassium chloride (KLOR-CON M) 10 MEQ tablet TAKE 1 TABLET BY MOUTH DAILY   promethazine (PHENERGAN) 12.5 MG tablet Take 1 tablet by mouth  every 6 hours as needed for nausea   QUEtiapine (SEROQUEL XR) 300 MG 24 hr tablet Take 2 tablets (600 mg total) by mouth at bedtime.   sucralfate (CARAFATE) 1 g tablet TAKE ONE TABLET BY MOUTH FOUR TIMES DAILY   vitamin B-12 (CYANOCOBALAMIN) 1000 MCG tablet Take 1 tablet (1,000 mcg total) by mouth daily.  albuterol (PROVENTIL) (2.5 MG/3ML) 0.083% nebulizer solution  (Patient not taking: Reported on 03/06/2023)   ARIPiprazole (ABILIFY) 10 MG tablet Take 0.5 tablets (5 mg total) by mouth daily. (Patient not taking: Reported on 03/06/2023)   cetirizine (ZYRTEC) 10 MG tablet Take by mouth. (Patient not taking: Reported on 03/06/2023)   Cholecalciferol (VITAMIN D) 50 MCG (2000 UT) tablet Take 2,000 Units by mouth daily. (Patient not taking: Reported on 03/06/2023)   diphenhydramine-acetaminophen (TYLENOL PM) 25-500 MG TABS tablet Take 1 tablet by mouth at bedtime as needed. (Patient not taking: Reported on 03/06/2023)   FLUCELVAX QUADRIVALENT SUSP injection    methocarbamol (ROBAXIN) 500 MG tablet  (Patient not  taking: Reported on 03/06/2023)   naloxone (NARCAN) 4 MG/0.1ML LIQD nasal spray kit Place 0.4 mg into the nose as needed (opioid overdose). (Patient not taking: Reported on 12/17/2022)   PFIZER COVID-19 VAC BIVALENT injection    Probiotic Product (PROBIOTIC DAILY PO) Take by mouth. (Patient not taking: Reported on 03/06/2023)   No facility-administered encounter medications on file as of 03/06/2023.    Allergies (verified) Ambien [zolpidem tartrate]; Anesthetics, halogenated; Hydrocodone; Lactose intolerance (gi); Nsaids; Propoxyphene n-acetaminophen; Tramadol; and Chantix [varenicline]   History: Past Medical History:  Diagnosis Date   Allergic rhinitis    Arthritis    Brain aneurysm    Chronic abdominal pain    COPD (chronic obstructive pulmonary disease) (HCC)    denies   Depression    bipolar   Fibromyalgia    Gastroparesis    GERD (gastroesophageal reflux disease)    H/O hiatal hernia    History of colonic polyps    HLD (hyperlipidemia)    IBS (irritable bowel syndrome)    chronic constipation   Migraines    OCD (obsessive compulsive disorder)    anxiety   Peripheral neuropathy    Past Surgical History:  Procedure Laterality Date   APPENDECTOMY     CERVICAL DISC SURGERY     cholecystectomy     coil to brain aneurysm     COLONOSCOPY     ELBOW SURGERY Bilateral    tendonitis   IR ANGIO INTRA EXTRACRAN SEL COM CAROTID INNOMINATE BILAT MOD SED  07/31/2017   IR ANGIO INTRA EXTRACRAN SEL COM CAROTID INNOMINATE BILAT MOD SED  12/30/2019   IR ANGIO VERTEBRAL SEL VERTEBRAL BILAT MOD SED  07/31/2017   IR ANGIO VERTEBRAL SEL VERTEBRAL BILAT MOD SED  12/30/2019   NECK SURGERY     OOPHORECTOMY     RADIOLOGY WITH ANESTHESIA N/A 05/10/2013   Procedure: RADIOLOGY WITH ANESTHESIA;  Surgeon: Oneal Grout, MD;  Location: [facility] OR;  Service: Radiology;  Laterality: N/A;   SHOULDER SURGERY Left    TOTAL ABDOMINAL HYSTERECTOMY     TUBAL LIGATION     VESICOVAGINAL FISTULA CLOSURE  W/ TAH     Family History  Problem Relation Age of Onset   Cancer Father        lung   Cancer Mother        brain   Coronary artery disease Other        family hx   Cancer Other        family hx   Diabetes Other        DM - family hx   Hypertension Other        family hx   Seizures Other        family hx   Cancer Sister        stomach  Leukemia Sister    Colon cancer Neg Hx    Social History   Socioeconomic History   Marital status: Widowed    Spouse name: Not on file   Number of children: Not on file   Years of education: Not on file   Highest education level: Not on file  Occupational History    Employer: DISABLED  Tobacco Use   Smoking status: Former    Current packs/day: 0.10    Average packs/day: 0.1 packs/day for 26.0 years (2.6 ttl pk-yrs)    Types: Cigarettes, E-cigarettes   Smokeless tobacco: Never  Vaping Use   Vaping status: Never Used  Substance and Sexual Activity   Alcohol use: No   Drug use: No   Sexual activity: Not Currently  Other Topics Concern   Not on file  Social History Narrative   Widow. Does not work. Drinks coffee in AM    Social Determinants of Health   Financial Resource Strain: Medium Risk (03/06/2023)   Overall Financial Resource Strain (CARDIA)    Difficulty of Paying Living Expenses: Somewhat hard  Food Insecurity: Food Insecurity Present (03/06/2023)   Hunger Vital Sign    Worried About Running Out of Food in the Last Year: Sometimes true    Ran Out of Food in the Last Year: Sometimes true  Transportation Needs: Unmet Transportation Needs (03/06/2023)   PRAPARE - Administrator, Civil Service (Medical): Yes    Lack of Transportation (Non-Medical): No  Physical Activity: Inactive (03/06/2023)   Exercise Vital Sign    Days of Exercise per Week: 0 days    Minutes of Exercise per Session: 0 min  Stress: No Stress Concern Present (03/06/2023)   [employer] of Occupational Health - Occupational Stress  Questionnaire    Feeling of Stress : Only a little  Social Connections: Moderately Integrated (03/06/2023)   Social Connection and Isolation Panel [NHANES]    Frequency of Communication with Friends and Family: More than three times a week    Frequency of Social Gatherings with Friends and Family: Three times a week    Attends Religious Services: More than 4 times per year    Active Member of Clubs or Organizations: No    Attends Banker Meetings: More than 4 times per year    Marital Status: Widowed    Tobacco Counseling Counseling given: Not Answered   Clinical Intake:  Pre-visit preparation completed: Yes  Pain : 0-10 Pain Score: 7  Pain Type: Chronic pain Pain Location: Back Pain Orientation: Lower     BMI - recorded: 24.72 Nutritional Status: BMI of 19-24  Normal Nutritional Risks: None Diabetes: No  How often do you need to have someone help you when you read instructions, pamphlets, or other written materials from your doctor or pharmacy?: 1 - Never What is the last grade level you completed in school?: HSG  Interpreter Needed?: No  Information entered by :: Susie Cassette, LPN.   Activities of Daily Living    03/06/2023    4:14 PM  In your present state of health, do you have any difficulty performing the following activities:  Hearing? 0  Vision? 0  Difficulty concentrating or making decisions? 0  Walking or climbing stairs? 0  Dressing or bathing? 0  Doing errands, shopping? 0  Preparing Food and eating ? N  Using the Toilet? N  In the past six months, have you accidently leaked urine? N  Do you have problems with loss of bowel  control? N  Managing your Medications? N  Managing your Finances? N  Housekeeping or managing your Housekeeping? N    Patient Care Team: Corwin Levins, MD as PCP - General  Indicate any recent Medical Services you may have received from other than [facility] providers in the past year (date may be  approximate).     Assessment:   This is a routine wellness examination for [place].  Hearing/Vision screen Hearing Screening - Comments:: Patient denied any hearing difficulty.   No hearing aids.   Vision Screening - Comments:: Patient does wear corrective lenses/contacts.  Annual eye exam done by: [facility] (Dr. Ellwood Sayers)     Goals Addressed             This Visit's Progress    Client understands the importance of follow-up with providers by attending scheduled visits        Depression Screen    03/06/2023    4:14 PM 11/12/2022    3:17 PM 04/17/2022    3:35 PM 02/11/2022   11:27 AM 02/11/2022   11:09 AM 10/17/2021    3:29 PM 07/16/2021    3:31 PM  PHQ 2/9 Scores  PHQ - 2 Score 0 0 0 0 1 4 1   PHQ- 9 Score 0  0   13     Fall Risk    03/06/2023    4:12 PM 11/29/2022    9:41 AM 11/12/2022    3:17 PM 04/17/2022    3:35 PM 02/11/2022   10:58 AM  Fall Risk   Falls in the past year? 1  0 0 0  Number falls in past yr: 1 0 0  0  Comment  no falls     Injury with Fall? 1  0  0  Risk for fall due to : History of fall(s);Impaired balance/gait;Orthopedic patient  No Fall Risks No Fall Risks History of fall(s)  Risk for fall due to: Comment     uses walker / Cane  Follow up Education provided;Falls prevention discussed;Falls evaluation completed  Falls evaluation completed Falls evaluation completed Falls evaluation completed;Education provided    MEDICARE RISK AT HOME: Medicare Risk at Home Any stairs in or around the home?: No If so, are there any without handrails?: No Home free of loose throw rugs in walkways, pet beds, electrical cords, etc?: Yes Adequate lighting in your home to reduce risk of falls?: Yes Life alert?: No Use of a cane, walker or w/c?: Yes Grab bars in the bathroom?: Yes Shower chair or bench in shower?: Yes Elevated toilet seat or a handicapped toilet?: Yes  TIMED UP AND GO:  Was the test performed?  No    Cognitive Function:         03/06/2023    4:14 PM 02/11/2022   11:26 AM 02/11/2022   11:16 AM  6CIT Screen  What Year? 0 points 0 points 0 points  What month? 0 points 0 points 0 points  What time? 0 points 0 points 0 points  Count back from 20 0 points 0 points 0 points  Months in reverse 0 points 2 points 0 points  Repeat phrase 0 points 2 points 0 points  Total Score 0 points 4 points 0 points    Immunizations Immunization History  Administered Date(s) Administered   Fluad Quad(high Dose 65+) 03/31/2015, 03/01/2016, 03/21/2018, 03/05/2019   H1N1 07/11/2008   Hpv-Unspecified 03/19/2022   Influenza Inj Mdck Quad Pf 03/22/2017   Influenza Inj Mdck Quad With Preservative  03/21/2018   Influenza Split 02/20/2012, 02/27/2014, 03/19/2022   Influenza Whole 05/21/2007, 04/15/2008, 03/16/2009, 02/21/2010   Influenza,inj,Quad PF,6+ Mos 03/31/2015, 03/01/2016   Influenza,inj,quad, With Preservative 03/21/2018, 03/05/2019   Influenza-Unspecified 02/20/2012, 01/15/2014, 02/27/2014, 03/31/2015, 03/01/2016, 03/21/2018, 03/05/2019, 04/17/2020, 03/15/2021   Moderna Sars-Covid-2 Vaccination 10/05/2019, 11/04/2019, 05/17/2020   Pfizer Covid-19 Vaccine Bivalent Booster 71yrs & up 03/01/2021   Pneumococcal Conjugate-13 03/19/2016   Pneumococcal Polysaccharide-23 05/01/2015, 08/08/2020   Pneumococcal-Unspecified 05/01/2015, 08/08/2020   Respiratory Syncytial Virus Vaccine,Recomb Aduvanted(Arexvy) 03/19/2022   Td 01/18/2008   Td (Adult), 2 Lf Tetanus Toxid, Preservative Free 01/18/2008   Tdap 11/26/2018   Zoster Recombinant(Shingrix) 10/17/2021, 11/09/2022    TDAP status: Up to date  Flu Vaccine status: Due, Education has been provided regarding the importance of this vaccine. Advised may receive this vaccine at local pharmacy or Health Dept. Aware to provide a copy of the vaccination record if obtained from local pharmacy or Health Dept. Verbalized acceptance and understanding.  Pneumococcal vaccine status: Up to  date  Covid-19 vaccine status: Completed vaccines  Qualifies for Shingles Vaccine? Yes   Zostavax completed No   Shingrix Completed?: No.    Education has been provided regarding the importance of this vaccine. Patient has been advised to call insurance company to determine out of pocket expense if they have not yet received this vaccine. Advised may also receive vaccine at local pharmacy or Health Dept. Verbalized acceptance and understanding.  Screening Tests Health Maintenance  Topic Date Due   INFLUENZA VACCINE  01/16/2023   MAMMOGRAM  03/08/2023   Medicare Annual Wellness (AWV)  03/05/2024   Colonoscopy  05/01/2025   DTaP/Tdap/Td (3 - Td or Tdap) 11/25/2028   Hepatitis C Screening  Completed   HIV Screening  Completed   Zoster Vaccines- Shingrix  Completed   HPV VACCINES  Aged Out   COVID-19 Vaccine  Discontinued    Health Maintenance  Health Maintenance Due  Topic Date Due   INFLUENZA VACCINE  01/16/2023    Colorectal cancer screening: Type of screening: Colonoscopy. Completed 01/27/2017. Repeat every 5 years  Mammogram status: Completed 03/07/2022. Repeat every year Scheduled for 03/26/2023  Bone density status: Never done  Lung Cancer Screening: (Low Dose CT Chest recommended if Age 74-80 years, 20 pack-year currently smoking OR have quit w/in 15years.) does not qualify.   Lung Cancer Screening Referral: no  Additional Screening:  Hepatitis C Screening: does qualify; Completed 08/01/2015  Vision Screening: Recommended annual ophthalmology exams for early detection of glaucoma and other disorders of the eye. Is the patient up to date with their annual eye exam?  Yes  Who is the provider or what is the name of the office in which the patient attends annual eye exams? Dr. Ellwood Sayers  If pt is not established with a provider, would they like to be referred to a provider to establish care? No .   Dental Screening: Recommended annual dental exams for proper oral  hygiene  Diabetic Foot Exam: N/A  Community Resource Referral / Chronic Care Management: CRR required this visit?  No   CCM required this visit?  No     Plan:     I have personally reviewed and noted the following in the patient's chart:   Medical and social history Use of alcohol, tobacco or illicit drugs  Current medications and supplements including opioid prescriptions. Patient is currently taking opioid prescriptions. Information provided to patient regarding non-opioid alternatives. Patient advised to discuss non-opioid treatment plan with their provider. Functional ability and  status Nutritional status Physical activity Advanced directives List of other physicians Hospitalizations, surgeries, and ER visits in previous 12 months Vitals Screenings to include cognitive, depression, and falls Referrals and appointments  In addition, I have reviewed and discussed with patient certain preventive protocols, quality metrics, and best practice recommendations. A written personalized care plan for preventive services as well as general preventive health recommendations were provided to patient.     Mickeal Needy, LPN   12/14/5282   After Visit Summary: (Mail) Due to this being a telephonic visit, the after visit summary with patients personalized plan was offered to patient via mail   Nurse Notes: Normal cognitive status assessed by direct observation via telephone conversation by this Nurse Health Advisor. No abnormalities found.  Patient provided his/her weight during this virtual phone visit.

## 2023-03-07 ENCOUNTER — Telehealth: Payer: Self-pay | Admitting: *Deleted

## 2023-03-07 NOTE — Progress Notes (Signed)
Care Coordination   Note   03/07/2023 Name: [name] MRN: [id number] DOB: December 04, 1955  [name] is a 67 y.o. year old female who sees Corwin Levins, MD for primary care. I reached out to [name] by phone today to offer care coordination services.  Ms. Winner was given information about Care Coordination services today including:   The Care Coordination services include support from the care team which includes your Nurse Coordinator, Clinical Social Worker, or Pharmacist.  The Care Coordination team is here to help remove barriers to the health concerns and goals most important to you. Care Coordination services are voluntary, and the patient may decline or stop services at any time by request to their care team member.   Care Coordination Consent Status: Patient agreed to services and verbal consent obtained.   Follow up plan:  Telephone appointment with care coordination team member scheduled for:  03/13/2023  Encounter Outcome:  Patient Scheduled from referral   Burman Nieves, [facility] Care Coordination Care Guide Direct Dial: [phone]

## 2023-03-10 DIAGNOSIS — M4726 Other spondylosis with radiculopathy, lumbar region: Secondary | ICD-10-CM | POA: Diagnosis not present

## 2023-03-10 DIAGNOSIS — Z79891 Long term (current) use of opiate analgesic: Secondary | ICD-10-CM | POA: Diagnosis not present

## 2023-03-10 DIAGNOSIS — M461 Sacroiliitis, not elsewhere classified: Secondary | ICD-10-CM | POA: Diagnosis not present

## 2023-03-10 DIAGNOSIS — M961 Postlaminectomy syndrome, not elsewhere classified: Secondary | ICD-10-CM | POA: Diagnosis not present

## 2023-03-10 DIAGNOSIS — G894 Chronic pain syndrome: Secondary | ICD-10-CM | POA: Diagnosis not present

## 2023-03-10 DIAGNOSIS — M542 Cervicalgia: Secondary | ICD-10-CM | POA: Diagnosis not present

## 2023-03-10 DIAGNOSIS — M5136 Other intervertebral disc degeneration, lumbar region: Secondary | ICD-10-CM | POA: Diagnosis not present

## 2023-03-12 ENCOUNTER — Other Ambulatory Visit: Payer: Self-pay | Admitting: Internal Medicine

## 2023-03-13 ENCOUNTER — Ambulatory Visit: Payer: Self-pay

## 2023-03-13 ENCOUNTER — Other Ambulatory Visit: Payer: Self-pay

## 2023-03-13 NOTE — Patient Outreach (Signed)
Care Coordination   Initial Visit Note   03/13/2023 Name: [name] MRN: [id number] DOB: 22-May-1956  [name] is a 67 y.o. year old female who sees Corwin Levins, MD for primary care. I spoke with  [name] by phone today.  What matters to the patients health and wellness today?  Patient declines acute needs during today's call    Goals Addressed             This Visit's Progress    COMPLETED: Care Coordination Activities       Care Coordination Interventions: Referral received requesting assistance with food resources Discussed the patients son assists as needed with purchasing food. Patient also reports she has a new roommate who will begin assisting with purchasing groceries and covering half the household expenses Assessed for patient interest in a food pantry list - patient declined resource Encouraged the patient to contact her provider as needed        SDOH assessments and interventions completed:  Yes  SDOH Interventions Today    Flowsheet Row Most Recent Value  SDOH Interventions   Food Insecurity Interventions Patient Declined  [pt reports her son ensures she has food and a friend recently moved in who will split bills,  declined food resources]        Care Coordination Interventions:  Yes, provided   Interventions Today    Flowsheet Row Most Recent Value  Chronic Disease   Chronic disease during today's visit Other  General Interventions   General Interventions Discussed/Reviewed General Interventions Discussed  [Discussed patient no longer needing resources]        Follow up plan: No further intervention required.   Encounter Outcome:  Patient Visit Completed   Bevelyn Ngo, BSW, CDP [facility]  [facility], [facility] Social Worker Direct Dial: [phone]  Fax: [phone]

## 2023-03-13 NOTE — Patient Instructions (Signed)
Visit Information  Thank you for taking time to visit with me today. Please don't hesitate to contact me if I can be of assistance to you.   If you are experiencing a Mental Health or Behavioral Health Crisis or need someone to talk to, please call 1-800-273-TALK (toll free, 24 hour hotline) call 911  Patient verbalizes understanding of instructions and care plan provided today and agrees to view in MyChart. Active MyChart status and patient understanding of how to access instructions and care plan via MyChart confirmed with patient.     No further follow up required: Please contact your provider as needed.  Bevelyn Ngo, BSW, CDP [facility]  [facility], [facility] Social Worker Direct Dial: [phone]  Fax: [phone]

## 2023-03-19 ENCOUNTER — Ambulatory Visit: Payer: Medicare Other | Admitting: Physician Assistant

## 2023-03-26 ENCOUNTER — Ambulatory Visit: Payer: Medicare Other

## 2023-03-26 DIAGNOSIS — Z1231 Encounter for screening mammogram for malignant neoplasm of breast: Secondary | ICD-10-CM

## 2023-04-07 ENCOUNTER — Ambulatory Visit: Payer: Medicare Other | Admitting: Physician Assistant

## 2023-04-08 DIAGNOSIS — M461 Sacroiliitis, not elsewhere classified: Secondary | ICD-10-CM | POA: Diagnosis not present

## 2023-04-16 ENCOUNTER — Other Ambulatory Visit (HOSPITAL_COMMUNITY): Payer: Self-pay | Admitting: Interventional Radiology

## 2023-04-16 DIAGNOSIS — I671 Cerebral aneurysm, nonruptured: Secondary | ICD-10-CM

## 2023-04-22 DIAGNOSIS — L720 Epidermal cyst: Secondary | ICD-10-CM | POA: Diagnosis not present

## 2023-04-22 DIAGNOSIS — L821 Other seborrheic keratosis: Secondary | ICD-10-CM | POA: Diagnosis not present

## 2023-04-24 ENCOUNTER — Ambulatory Visit (HOSPITAL_COMMUNITY)
Admission: RE | Admit: 2023-04-24 | Discharge: 2023-04-24 | Disposition: A | Discharge status: Home / Self Care | Payer: Medicare Other | Source: Ambulatory Visit | Attending: Interventional Radiology | Admitting: Interventional Radiology

## 2023-04-24 DIAGNOSIS — I672 Cerebral atherosclerosis: Secondary | ICD-10-CM | POA: Diagnosis not present

## 2023-04-24 DIAGNOSIS — I671 Cerebral aneurysm, nonruptured: Secondary | ICD-10-CM | POA: Insufficient documentation

## 2023-04-24 MED ORDER — IOHEXOL 350 MG/ML SOLN
75.0000 mL | Freq: Once | INTRAVENOUS | Status: AC | PRN
  Administered 2023-04-24: 75 mL via INTRAVENOUS

## 2023-04-24 MED ORDER — SODIUM CHLORIDE (PF) 0.9 % IJ SOLN
INTRAMUSCULAR | Status: AC
  Filled 2023-04-24: qty 50

## 2023-04-28 ENCOUNTER — Encounter: Payer: Self-pay | Admitting: Physician Assistant

## 2023-04-28 ENCOUNTER — Ambulatory Visit: Payer: Medicare Other | Admitting: Physician Assistant

## 2023-04-28 DIAGNOSIS — F411 Generalized anxiety disorder: Secondary | ICD-10-CM | POA: Diagnosis not present

## 2023-04-28 DIAGNOSIS — F99 Mental disorder, not otherwise specified: Secondary | ICD-10-CM

## 2023-04-28 DIAGNOSIS — F5105 Insomnia due to other mental disorder: Secondary | ICD-10-CM | POA: Diagnosis not present

## 2023-04-28 DIAGNOSIS — F259 Schizoaffective disorder, unspecified: Secondary | ICD-10-CM | POA: Diagnosis not present

## 2023-04-28 MED ORDER — BUPROPION HCL ER (XL) 300 MG PO TB24: 300.0000 mg | ORAL_TABLET | Freq: Every day | ORAL | 3 refills | Status: DC

## 2023-04-28 MED ORDER — BUPROPION HCL ER (XL) 150 MG PO TB24: 150.0000 mg | ORAL_TABLET | Freq: Every day | ORAL | 3 refills | Status: DC

## 2023-04-28 NOTE — Progress Notes (Signed)
[facility]  Patient ID: [name],  MRN: [id number]  PCP: Corwin Levins, MD  Date of Evaluation: 04/28/2023 Time spent:25 minutes  Chief Complaint:  Chief Complaint   Follow-up    HISTORY/CURRENT STATUS: HPI  For routine med check.  States she's doing pretty well as far as her mental health goes. Has a roommate now and it's going well. Has 3 cats and they give her a lot of joy.  Energy and motivation are good.  No extreme sadness, tearfulness, or feelings of hopelessness.  Sleeps well most of the time. ADLs and personal hygiene are normal.   Denies any changes in concentration, making decisions, or remembering things.  Appetite has not changed.  Weight is stable.  Not having a lot of anxiety.  Does occas, but it's controllable. Denies suicidal or homicidal thoughts.  Patient denies increased energy with decreased need for sleep, increased talkativeness, racing thoughts, impulsivity or risky behaviors, increased spending, increased libido, grandiosity, increased irritability or anger, paranoia, or hallucinations.  Denies dizziness, syncope, seizures, numbness, tingling, tremor, tics, unsteady gait, slurred speech, confusion.  Has chronic multi joint pain, uses a cane when she is outside her home.  Individual Medical History/ Review of Systems: Changes? :No   Past medications for mental health diagnoses include: Ingrezza was ineffective, Lamictal, Risperdal, Zyprexa, Cogentin, Latuda, Cymbalta, lithium, Ambien, prazosin, Ativan, Wellbutrin, Zoloft, and probably others, trazodone possibly caused double vision and a fall. Seroquel, Gabapentin didn't help anxiety, Mirtazepine caused dizziness  Allergies: Ambien [zolpidem tartrate]; Anesthetics, halogenated; Hydrocodone; Lactose intolerance (gi); Nsaids; Propoxyphene n-acetaminophen; Tramadol; and Chantix [varenicline]  Current Medications:  Current Outpatient Medications:    albuterol (PROVENTIL) (2.5 MG/3ML) 0.083%  nebulizer solution, , Disp: , Rfl:    aspirin 325 MG tablet, Take 325 mg by mouth daily., Disp: , Rfl:    atorvastatin (LIPITOR) 40 MG tablet, TAKE 1 TABLET BY MOUTH DAILY, Disp: 100 tablet, Rfl: 2   bisacodyl (DULCOLAX) 5 MG EC tablet, Take by mouth., Disp: , Rfl:    cetirizine (ZYRTEC) 10 MG tablet, Take by mouth., Disp: , Rfl:    Cholecalciferol (VITAMIN D) 50 MCG (2000 UT) tablet, Take 2,000 Units by mouth daily., Disp: , Rfl:    diphenhydrAMINE (BENADRYL) 25 MG tablet, Take by mouth., Disp: , Rfl:    diphenhydramine-acetaminophen (TYLENOL PM) 25-500 MG TABS tablet, Take 1 tablet by mouth at bedtime as needed., Disp: , Rfl:    FLONASE ALLERGY RELIEF 50 MCG/ACT nasal spray, , Disp: , Rfl:    FLUCELVAX QUADRIVALENT SUSP injection, , Disp: , Rfl:    ibuprofen (ADVIL) 200 MG tablet, Take by mouth., Disp: , Rfl:    lamoTRIgine (LAMICTAL) 100 MG tablet, TAKE 1 TABLET BY MOUTH IN THE  MORNING AND 3 TABLETS BY MOUTH  AT BEDTIME, Disp: 400 tablet, Rfl: 2   metFORMIN (GLUCOPHAGE) 500 MG tablet, Take 1 tablet by mouth twice daily with food, Disp: 180 tablet, Rfl: 0   omeprazole (PRILOSEC) 40 MG capsule, Take 40 mg by mouth 2 (two) times daily., Disp: , Rfl:    Oxycodone HCl 10 MG TABS, Take 1 tablet by mouth every 6 (six) hours as needed (pain). , Disp: , Rfl:    potassium chloride (KLOR-CON M) 10 MEQ tablet, TAKE 1 TABLET BY MOUTH DAILY, Disp: 90 tablet, Rfl: 3   promethazine (PHENERGAN) 12.5 MG tablet, Take 1 tablet by mouth  every 6 hours as needed for nausea, Disp: 90 tablet, Rfl: 0   QUEtiapine (SEROQUEL XR) 300  MG 24 hr tablet, Take 2 tablets (600 mg total) by mouth at bedtime., Disp: 180 tablet, Rfl: 3   sucralfate (CARAFATE) 1 g tablet, TAKE ONE TABLET BY MOUTH FOUR TIMES DAILY, Disp: 120 tablet, Rfl: 0   vitamin B-12 (CYANOCOBALAMIN) 1000 MCG tablet, Take 1 tablet (1,000 mcg total) by mouth daily., Disp: 90 tablet, Rfl: 3   buPROPion (WELLBUTRIN XL) 150 MG 24 hr tablet, Take 1 tablet (150 mg  total) by mouth daily., Disp: 90 tablet, Rfl: 3   buPROPion (WELLBUTRIN XL) 300 MG 24 hr tablet, Take 1 tablet (300 mg total) by mouth daily., Disp: 90 tablet, Rfl: 3   methocarbamol (ROBAXIN) 500 MG tablet, , Disp: , Rfl:    naloxone (NARCAN) 4 MG/0.1ML LIQD nasal spray kit, Place 0.4 mg into the nose as needed (opioid overdose). (Patient not taking: Reported on 12/17/2022), Disp: , Rfl:    PFIZER COVID-19 VAC BIVALENT injection, , Disp: , Rfl:    Probiotic Product (PROBIOTIC DAILY PO), Take by mouth. (Patient not taking: Reported on 03/06/2023), Disp: , Rfl:  Medication Side Effects: weight gain  Family Medical/ Social History: Changes?  Has a roommate now. It's going well.  MENTAL HEALTH EXAM:  There were no vitals taken for this visit.There is no height or weight on file to calculate BMI.  General Appearance: Casual, Well Groomed, and Obese  Eye Contact:  Good  Speech:  Clear and Coherent and Normal Rate  Volume:  Normal  Mood:  Euthymic  Affect:  Congruent  Thought Process:  Goal Directed and Descriptions of Associations: Circumstantial  Orientation:  Full (Time, Place, and Person)  Thought Content: Logical   Suicidal Thoughts:  No  Homicidal Thoughts:  No  Memory:  WNL  Judgement:  Good  Insight:  Good  Psychomotor Activity:  Normal  Concentration:  Concentration: Good  Recall:  Good  Fund of Knowledge: Good  Language: Good  Assets:  Desire for Improvement Financial Resources/Insurance Housing Resilience  ADL's:  Intact  Cognition: WNL  Prognosis:  Good   DIAGNOSES:    ICD-10-CM   1. Schizoaffective disorder, unspecified type (HCC)  F25.9     2. Generalized anxiety disorder  F41.1     3. Insomnia due to other mental disorder  F51.05    F99      Receiving Psychotherapy: No   RECOMMENDATIONS:  PDMP reviewed.  Oxycodone known to me.   I provided 25 minutes of face to face time during this encounter, including time spent before and after the visit in records  review, medical decision making, counseling pertinent to today's visit, and charting.   Doing well w/ current meds so no changes are needed.   Continue Wellbutrin XL 450 mg daily. Continue Lamictal 100 mg, 1 p.o. every morning and 3 p.o. nightly. Continue metformin 500 mg, 1 p.o. twice daily.  Take with food. Continue Seroquel XR 300 mg, 2 p.o. nightly. Continue vitamins as per med list. Return in 6 months.  Melony Overly, PA-C

## 2023-04-29 DIAGNOSIS — M461 Sacroiliitis, not elsewhere classified: Secondary | ICD-10-CM | POA: Diagnosis not present

## 2023-04-29 DIAGNOSIS — M4726 Other spondylosis with radiculopathy, lumbar region: Secondary | ICD-10-CM | POA: Diagnosis not present

## 2023-04-29 DIAGNOSIS — Z79891 Long term (current) use of opiate analgesic: Secondary | ICD-10-CM | POA: Diagnosis not present

## 2023-04-29 DIAGNOSIS — G894 Chronic pain syndrome: Secondary | ICD-10-CM | POA: Diagnosis not present

## 2023-04-29 DIAGNOSIS — M51362 Other intervertebral disc degeneration, lumbar region with discogenic back pain and lower extremity pain: Secondary | ICD-10-CM | POA: Diagnosis not present

## 2023-04-29 DIAGNOSIS — M961 Postlaminectomy syndrome, not elsewhere classified: Secondary | ICD-10-CM | POA: Diagnosis not present

## 2023-05-02 DIAGNOSIS — Z860101 Personal history of adenomatous and serrated colon polyps: Secondary | ICD-10-CM | POA: Diagnosis not present

## 2023-05-02 DIAGNOSIS — R1310 Dysphagia, unspecified: Secondary | ICD-10-CM | POA: Diagnosis not present

## 2023-05-02 DIAGNOSIS — K5900 Constipation, unspecified: Secondary | ICD-10-CM | POA: Diagnosis not present

## 2023-05-02 DIAGNOSIS — R112 Nausea with vomiting, unspecified: Secondary | ICD-10-CM | POA: Diagnosis not present

## 2023-05-02 DIAGNOSIS — K219 Gastro-esophageal reflux disease without esophagitis: Secondary | ICD-10-CM | POA: Diagnosis not present

## 2023-05-05 ENCOUNTER — Encounter: Payer: Self-pay | Admitting: Internal Medicine

## 2023-05-06 ENCOUNTER — Other Ambulatory Visit: Payer: Self-pay | Admitting: Physician Assistant

## 2023-05-26 DIAGNOSIS — K449 Diaphragmatic hernia without obstruction or gangrene: Secondary | ICD-10-CM | POA: Diagnosis not present

## 2023-05-26 DIAGNOSIS — K219 Gastro-esophageal reflux disease without esophagitis: Secondary | ICD-10-CM | POA: Diagnosis not present

## 2023-05-26 DIAGNOSIS — R112 Nausea with vomiting, unspecified: Secondary | ICD-10-CM | POA: Diagnosis not present

## 2023-05-27 ENCOUNTER — Telehealth (HOSPITAL_COMMUNITY): Payer: Self-pay

## 2023-05-27 NOTE — Telephone Encounter (Signed)
Pt agreed to f/u in 2 years with a cta head/neck. AB

## 2023-06-17 ENCOUNTER — Ambulatory Visit
Admission: EM | Admit: 2023-06-17 | Discharge: 2023-06-17 | Disposition: A | Discharge status: Home / Self Care | Payer: Medicare Other | Attending: Family Medicine | Admitting: Family Medicine

## 2023-06-17 DIAGNOSIS — W19XXXA Unspecified fall, initial encounter: Secondary | ICD-10-CM

## 2023-06-17 DIAGNOSIS — S0083XA Contusion of other part of head, initial encounter: Secondary | ICD-10-CM

## 2023-06-17 NOTE — ED Provider Notes (Signed)
 [name]    CSN: [id number] Arrival date & time: 06/17/23  1012      History   Chief Complaint Chief Complaint  Patient presents with   Fall   Head Injury    HPI [name] is a 67 y.o. female.   HPI  Patient fell today.  Tripped on carpet.  Fell face down and landed on her right forehead and cheek.  Has a big goose egg above her right eye.  Did not lose consciousness.  The fall was witnessed.  She tripped because of her balance.  Does not complain of any pain in her arms and legs.  Did not complain of any pain in her neck or back.  Only pain is in the area of the bruise.  Past Medical History:  Diagnosis Date   Allergic rhinitis    Arthritis    Brain aneurysm    Chronic abdominal pain    COPD (chronic obstructive pulmonary disease) (HCC)    denies   Depression    bipolar   Fibromyalgia    Gastroparesis    GERD (gastroesophageal reflux disease)    H/O hiatal hernia    History of colonic polyps    HLD (hyperlipidemia)    IBS (irritable bowel syndrome)    chronic constipation   Migraines    OCD (obsessive compulsive disorder)    anxiety   Peripheral neuropathy     Patient Active Problem List   Diagnosis Date Noted   Sacroiliitis, not elsewhere classified (HCC) 11/12/2022   Lumbar radiculopathy 11/27/2021   Abscess of left thumb 10/17/2021   Pneumonia due to COVID-19 virus 06/11/2021   Streptococcal bacteremia 06/11/2021   COVID-19 virus infection 05/21/2021   Acute respiratory failure with hypoxia (HCC) 05/21/2021   Multifocal pneumonia 05/21/2021   Chronic pain syndrome 05/21/2021   Chronic LLQ pain 08/08/2020   Drug-induced parkinsonism (HCC) 06/07/2020   Schizoaffective disorder (HCC) 06/07/2020   Tear of gluteus medius tendon, initial encounter 03/20/2020   Gait disorder 02/17/2020   Left hip pain 12/07/2019   RLQ abdominal pain 02/09/2018   Epigastric pain 08/11/2017   Chronic RLQ pain 08/11/2017   Chest pain 08/11/2017    Smoker 08/11/2017   Eustachian tube dysfunction, bilateral 02/06/2017   Hyperglycemia 08/09/2016   Encounter for well adult exam with abnormal findings 08/09/2016   Urinary incontinence 08/09/2016   Hoarseness 08/09/2016   Left elbow pain 11/30/2015   Cellulitis of elbow 10/26/2015   Acute sinus infection 08/01/2015   COPD exacerbation (HCC) 08/01/2015   Abnormal TSH 04/01/2015   Aspiration pneumonia (HCC) 04/01/2015   Hoarseness or changing voice 03/29/2014   Dysphagia 02/06/2014   Dysuria 09/02/2013   Chronic constipation 09/02/2013   Hypotension, unspecified 05/20/2013   Recurrent falls 05/20/2013   Nausea alone 01/31/2013   Orthostatic hypotension 01/29/2013   Knee effusion, right 07/03/2012   Chronic low back pain 05/21/2012   Tremor 04/30/2012   Lower abdominal pain 07/30/2011   Low back pain 07/30/2011   Urinary incontinence, mixed 07/30/2011   Left knee pain 07/30/2011   Right shoulder pain 05/17/2011   Insomnia 03/02/2011   Brain aneurysm 02/15/2011   INSOMNIA-SLEEP DISORDER-UNSPEC 08/31/2010   ULNAR NEUROPATHY 10/31/2009   Vitamin D  deficiency 09/27/2009   Intractable chronic migraine without aura 05/02/2009   Leukocytosis 04/05/2009   INTERMITTENT VERTIGO 02/16/2009   PERIPHERAL EDEMA 02/16/2009   COPD (chronic obstructive pulmonary disease) (HCC) 09/21/2008   PULMONARY NODULE 09/21/2008   Gastritis and gastroduodenitis 09/19/2008  Gastroparesis 09/19/2008   Blind loop syndrome 09/01/2008   B12 deficiency 08/18/2008   Mild depressed bipolar I disorder (HCC) 08/16/2008   FIBROMYALGIA 07/20/2008   WEIGHT LOSS 07/11/2008   HLD (hyperlipidemia) 07/20/2007   Anxiety state 07/20/2007   Hereditary and idiopathic peripheral neuropathy 07/20/2007   Allergic rhinitis 07/20/2007   GERD 07/20/2007   IBS 07/20/2007   History of colonic polyps 07/20/2007   Depression 11/18/2006    Past Surgical History:  Procedure Laterality Date   APPENDECTOMY     CERVICAL  DISC SURGERY     cholecystectomy     coil to brain aneurysm     COLONOSCOPY     ELBOW SURGERY Bilateral    tendonitis   IR ANGIO INTRA EXTRACRAN SEL COM CAROTID INNOMINATE BILAT MOD SED  07/31/2017   IR ANGIO INTRA EXTRACRAN SEL COM CAROTID INNOMINATE BILAT MOD SED  12/30/2019   IR ANGIO VERTEBRAL SEL VERTEBRAL BILAT MOD SED  07/31/2017   IR ANGIO VERTEBRAL SEL VERTEBRAL BILAT MOD SED  12/30/2019   NECK SURGERY     OOPHORECTOMY     RADIOLOGY WITH ANESTHESIA N/A 05/10/2013   Procedure: RADIOLOGY WITH ANESTHESIA;  Surgeon: Thyra MARLA Nash, MD;  Location: [facility] OR;  Service: Radiology;  Laterality: N/A;   SHOULDER SURGERY Left    TOTAL ABDOMINAL HYSTERECTOMY     TUBAL LIGATION     VESICOVAGINAL FISTULA CLOSURE W/ TAH      OB History   No obstetric history on file.      Home Medications    Prior to Admission medications   Medication Sig Start Date End Date Taking? Authorizing Provider  Tenapanor HCl (IBSRELA) 50 MG TABS Take by mouth. 05/02/23 10/29/23 Yes [provider]  albuterol  (PROVENTIL ) (2.5 MG/3ML) 0.083% nebulizer solution  06/01/21   [provider]  aspirin  325 MG tablet Take 325 mg by mouth daily.    [provider]  atorvastatin  (LIPITOR) 40 MG tablet TAKE 1 TABLET BY MOUTH DAILY 03/13/23   Norleen Lynwood ORN, MD  bisacodyl (DULCOLAX) 5 MG EC tablet Take by mouth.    [provider]  buPROPion  (WELLBUTRIN  XL) 150 MG 24 hr tablet Take 1 tablet (150 mg total) by mouth daily. 04/28/23   Rhys Boyer T, PA-C  buPROPion  (WELLBUTRIN  XL) 300 MG 24 hr tablet Take 1 tablet (300 mg total) by mouth daily. 04/28/23   Rhys Boyer T, PA-C  cetirizine  (ZYRTEC ) 10 MG tablet Take by mouth.    [provider]  Cholecalciferol (VITAMIN D ) 50 MCG (2000 UT) tablet Take 2,000 Units by mouth daily.    [provider]  diphenhydrAMINE (BENADRYL) 25 MG tablet Take by mouth.    [provider]  diphenhydramine-acetaminophen  (TYLENOL   PM) 25-500 MG TABS tablet Take 1 tablet by mouth at bedtime as needed.    [provider]  JEARLDINE ALLERGY RELIEF 50 MCG/ACT nasal spray  11/21/21   [provider]  FLUCELVAX QUADRIVALENT SUSP injection  03/01/21   [provider]  ibuprofen (ADVIL) 200 MG tablet Take by mouth. 12/19/21   [provider]  lamoTRIgine  (LAMICTAL ) 100 MG tablet TAKE 1 TABLET BY MOUTH IN THE  MORNING AND 3 TABLETS BY MOUTH  AT BEDTIME 05/07/23   Hurst, Teresa T, PA-C  metFORMIN  (GLUCOPHAGE ) 500 MG tablet Take 1 tablet by mouth twice daily with food 03/04/23   Rhys Boyer T, PA-C  methocarbamol  (ROBAXIN ) 500 MG tablet     [provider]  naloxone TAMMY)  4 MG/0.1ML LIQD nasal spray kit Place 0.4 mg into the nose as needed (opioid overdose). Patient not taking: Reported on 12/17/2022    [provider]  omeprazole (PRILOSEC) 40 MG capsule Take 40 mg by mouth 2 (two) times daily. 07/23/21   [provider]  Oxycodone  HCl 10 MG TABS Take 1 tablet by mouth every 6 (six) hours as needed (pain).  07/20/16   [provider]  PFIZER COVID-19 VAC BIVALENT injection  03/01/21   [provider]  potassium chloride  (KLOR-CON  M) 10 MEQ tablet TAKE 1 TABLET BY MOUTH DAILY 02/26/23   Norleen Lynwood ORN, MD  Probiotic Product (PROBIOTIC DAILY PO) Take by mouth. Patient not taking: Reported on 03/06/2023    [provider]  promethazine  (PHENERGAN ) 12.5 MG tablet Take 1 tablet by mouth  every 6 hours as needed for nausea 06/08/21   Norleen Lynwood ORN, MD  QUEtiapine  (SEROQUEL  XR) 300 MG 24 hr tablet Take 2 tablets (600 mg total) by mouth at bedtime. 12/17/22   Rhys Boyer T, PA-C  sucralfate  (CARAFATE ) 1 g tablet TAKE ONE TABLET BY MOUTH FOUR TIMES DAILY 01/22/22   Norleen Lynwood ORN, MD  vitamin B-12 (CYANOCOBALAMIN ) 1000 MCG tablet Take 1 tablet (1,000 mcg total) by mouth daily. 08/10/20   Norleen Lynwood ORN, MD    Family History Family History  Problem Relation Age of  Onset   Cancer Father        lung   Cancer Mother        brain   Coronary artery disease Other        family hx   Cancer Other        family hx   Diabetes Other        DM - family hx   Hypertension Other        family hx   Seizures Other        family hx   Cancer Sister        stomach   Leukemia Sister    Colon cancer Neg Hx     Social History Social History   Tobacco Use   Smoking status: Former    Current packs/day: 0.10    Average packs/day: 0.1 packs/day for 26.0 years (2.6 ttl pk-yrs)    Types: Cigarettes, E-cigarettes   Smokeless tobacco: Never  Vaping Use   Vaping status: Never Used  Substance Use Topics   Alcohol use: No   Drug use: No     Allergies   Ambien  [zolpidem  tartrate]; Anesthetics, halogenated; Hydrocodone ; Lactose intolerance (gi); Nsaids; Propoxyphene n-acetaminophen ; Tramadol; and Chantix  [varenicline ]   Review of Systems Review of Systems See HPI  Physical Exam Triage Vital Signs ED Triage Vitals  Encounter Vitals Group     BP 06/17/23 1033 123/84     Systolic BP Percentile --      Diastolic BP Percentile --      Pulse Rate 06/17/23 1031 91     Resp 06/17/23 1031 19     Temp 06/17/23 1031 98.1 F (36.7 C)     Temp Source 06/17/23 1031 Oral     SpO2 06/17/23 1031 98 %     Weight --      Height --      Head Circumference --      Peak Flow --      Pain Score 06/17/23 1030 3     Pain Loc --      Pain Education --  Exclude from Growth Chart --    No data found.  Updated Vital Signs BP 123/84 (BP Location: Right Arm)   Pulse 91   Temp 98.1 F (36.7 C) (Oral)   Resp 19   SpO2 98%      Physical Exam Vitals reviewed.  Constitutional:      General: She is not in acute distress.    Appearance: She is well-developed.     Comments: Small and frail in appearance  HENT:     Head: Normocephalic.      Comments: Patient has a hematoma at her right lateral eyebrow.  There is no bony tenderness of the orbital rim.  Eyes  have good ocular motion small bruise also on the right side of the nose with no bony tenderness or deformity Eyes:     Conjunctiva/sclera: Conjunctivae normal.     Pupils: Pupils are equal, round, and reactive to light.  Cardiovascular:     Rate and Rhythm: Normal rate.  Pulmonary:     Effort: Pulmonary effort is normal. No respiratory distress.  Abdominal:     General: There is no distension.     Palpations: Abdomen is soft.  Musculoskeletal:        General: Normal range of motion.     Cervical back: Normal range of motion.  Skin:    General: Skin is warm and dry.  Neurological:     General: No focal deficit present.     Mental Status: She is alert.     Gait: Gait normal.      UC Treatments / Results  Labs (all labs ordered are listed, but only abnormal results are displayed) Labs Reviewed - No data to display  EKG   Radiology No results found.  Procedures Procedures (including critical care time)  Medications Ordered in UC Medications - No data to display  Initial Impression / Assessment and Plan / UC Course  I have reviewed the triage vital signs and the nursing notes.  Pertinent labs & imaging results that were available during my care of the patient were reviewed by me and considered in my medical decision making (see chart for details).     With no loss of consciousness, no bony tenderness.  I do not feel x-rays are indicated.  Does not need scanning.  Patient appears to have a bruise that will improve over time.  Discussed conservative care Final Clinical Impressions(s) / UC Diagnoses   Final diagnoses:  Contusion of face, initial encounter  Hematoma of face, initial encounter  Fall, initial encounter     Discharge Instructions      Take Tylenol  as needed for pain Keep ice on area for 20 minutes every couple of hours Go to ER if you have any worsening headache or symptoms   ED Prescriptions   None    PDMP not reviewed this encounter.    Maranda Jamee Jacob, MD 06/17/23 [phone]

## 2023-06-17 NOTE — Discharge Instructions (Signed)
Take Tylenol as needed for pain Keep ice on area for 20 minutes every couple of hours Go to ER if you have any worsening headache or symptoms

## 2023-06-17 NOTE — ED Triage Notes (Addendum)
[facility] with [facility]. Pt reports she fell in a storage unit and hit her head on the concrete floor. Pt reports with swelling to right eyebrow and right knee pain. Pt denies loss of consciousness. Pt reports pmh of stents in brain.

## 2023-06-20 DIAGNOSIS — J449 Chronic obstructive pulmonary disease, unspecified: Secondary | ICD-10-CM | POA: Diagnosis not present

## 2023-06-20 DIAGNOSIS — K219 Gastro-esophageal reflux disease without esophagitis: Secondary | ICD-10-CM | POA: Diagnosis not present

## 2023-06-20 DIAGNOSIS — K449 Diaphragmatic hernia without obstruction or gangrene: Secondary | ICD-10-CM | POA: Diagnosis not present

## 2023-06-20 DIAGNOSIS — Z720 Tobacco use: Secondary | ICD-10-CM | POA: Diagnosis not present

## 2023-06-30 DIAGNOSIS — G894 Chronic pain syndrome: Secondary | ICD-10-CM | POA: Diagnosis not present

## 2023-06-30 DIAGNOSIS — M461 Sacroiliitis, not elsewhere classified: Secondary | ICD-10-CM | POA: Diagnosis not present

## 2023-06-30 DIAGNOSIS — M961 Postlaminectomy syndrome, not elsewhere classified: Secondary | ICD-10-CM | POA: Diagnosis not present

## 2023-06-30 DIAGNOSIS — M4726 Other spondylosis with radiculopathy, lumbar region: Secondary | ICD-10-CM | POA: Diagnosis not present

## 2023-06-30 DIAGNOSIS — M542 Cervicalgia: Secondary | ICD-10-CM | POA: Diagnosis not present

## 2023-06-30 DIAGNOSIS — M51362 Other intervertebral disc degeneration, lumbar region with discogenic back pain and lower extremity pain: Secondary | ICD-10-CM | POA: Diagnosis not present

## 2023-07-02 DIAGNOSIS — R112 Nausea with vomiting, unspecified: Secondary | ICD-10-CM | POA: Diagnosis not present

## 2023-07-02 DIAGNOSIS — Z860101 Personal history of adenomatous and serrated colon polyps: Secondary | ICD-10-CM | POA: Diagnosis not present

## 2023-07-02 DIAGNOSIS — R1310 Dysphagia, unspecified: Secondary | ICD-10-CM | POA: Diagnosis not present

## 2023-07-02 DIAGNOSIS — K5900 Constipation, unspecified: Secondary | ICD-10-CM | POA: Diagnosis not present

## 2023-07-02 DIAGNOSIS — K219 Gastro-esophageal reflux disease without esophagitis: Secondary | ICD-10-CM | POA: Diagnosis not present

## 2023-07-04 ENCOUNTER — Encounter: Payer: Self-pay | Admitting: Internal Medicine

## 2023-07-10 ENCOUNTER — Other Ambulatory Visit: Payer: Self-pay | Admitting: Physician Assistant

## 2023-07-14 DIAGNOSIS — Z79899 Other long term (current) drug therapy: Secondary | ICD-10-CM | POA: Diagnosis not present

## 2023-07-14 DIAGNOSIS — K219 Gastro-esophageal reflux disease without esophagitis: Secondary | ICD-10-CM | POA: Diagnosis not present

## 2023-07-21 DIAGNOSIS — J439 Emphysema, unspecified: Secondary | ICD-10-CM | POA: Diagnosis not present

## 2023-07-21 DIAGNOSIS — I2510 Atherosclerotic heart disease of native coronary artery without angina pectoris: Secondary | ICD-10-CM | POA: Diagnosis not present

## 2023-07-21 DIAGNOSIS — K449 Diaphragmatic hernia without obstruction or gangrene: Secondary | ICD-10-CM | POA: Diagnosis not present

## 2023-07-21 DIAGNOSIS — R918 Other nonspecific abnormal finding of lung field: Secondary | ICD-10-CM | POA: Diagnosis not present

## 2023-07-21 DIAGNOSIS — R911 Solitary pulmonary nodule: Secondary | ICD-10-CM | POA: Diagnosis not present

## 2023-07-25 DIAGNOSIS — J432 Centrilobular emphysema: Secondary | ICD-10-CM | POA: Diagnosis not present

## 2023-07-25 DIAGNOSIS — R911 Solitary pulmonary nodule: Secondary | ICD-10-CM | POA: Diagnosis not present

## 2023-07-25 DIAGNOSIS — F17211 Nicotine dependence, cigarettes, in remission: Secondary | ICD-10-CM | POA: Diagnosis not present

## 2023-07-29 DIAGNOSIS — K3189 Other diseases of stomach and duodenum: Secondary | ICD-10-CM | POA: Diagnosis not present

## 2023-07-29 DIAGNOSIS — K449 Diaphragmatic hernia without obstruction or gangrene: Secondary | ICD-10-CM | POA: Diagnosis not present

## 2023-07-29 DIAGNOSIS — R1319 Other dysphagia: Secondary | ICD-10-CM | POA: Diagnosis not present

## 2023-07-29 DIAGNOSIS — K219 Gastro-esophageal reflux disease without esophagitis: Secondary | ICD-10-CM | POA: Diagnosis not present

## 2023-07-29 DIAGNOSIS — J449 Chronic obstructive pulmonary disease, unspecified: Secondary | ICD-10-CM | POA: Diagnosis not present

## 2023-08-04 DIAGNOSIS — K219 Gastro-esophageal reflux disease without esophagitis: Secondary | ICD-10-CM | POA: Diagnosis not present

## 2023-08-04 DIAGNOSIS — R1310 Dysphagia, unspecified: Secondary | ICD-10-CM | POA: Diagnosis not present

## 2023-08-18 DIAGNOSIS — J449 Chronic obstructive pulmonary disease, unspecified: Secondary | ICD-10-CM | POA: Diagnosis not present

## 2023-08-18 DIAGNOSIS — K449 Diaphragmatic hernia without obstruction or gangrene: Secondary | ICD-10-CM | POA: Diagnosis not present

## 2023-08-18 DIAGNOSIS — Z720 Tobacco use: Secondary | ICD-10-CM | POA: Diagnosis not present

## 2023-08-18 DIAGNOSIS — K219 Gastro-esophageal reflux disease without esophagitis: Secondary | ICD-10-CM | POA: Diagnosis not present

## 2023-08-27 ENCOUNTER — Telehealth: Payer: Self-pay | Admitting: Physician Assistant

## 2023-08-27 NOTE — Telephone Encounter (Signed)
 Pt called 5:10 pm having stomach surgery April and concerned about meds needing to be crushed. RTC [phone]

## 2023-08-27 NOTE — Telephone Encounter (Signed)
 Patient is having stomach surgery in April. She will be on a liquid diet for 4 weeks and has been told she will need to crush medication and put in liquid. She is wanting feedback on her meds - can she crush them.

## 2023-08-27 NOTE — Telephone Encounter (Signed)
 Pt called back and said that the pharmicist told her that the seroquel and wellbutrin can not be crushed

## 2023-08-28 DIAGNOSIS — Z79899 Other long term (current) drug therapy: Secondary | ICD-10-CM | POA: Diagnosis not present

## 2023-08-28 DIAGNOSIS — K449 Diaphragmatic hernia without obstruction or gangrene: Secondary | ICD-10-CM | POA: Diagnosis not present

## 2023-08-28 DIAGNOSIS — E876 Hypokalemia: Secondary | ICD-10-CM | POA: Diagnosis not present

## 2023-08-28 DIAGNOSIS — Z87891 Personal history of nicotine dependence: Secondary | ICD-10-CM | POA: Diagnosis not present

## 2023-08-28 DIAGNOSIS — E7800 Pure hypercholesterolemia, unspecified: Secondary | ICD-10-CM | POA: Diagnosis not present

## 2023-08-28 DIAGNOSIS — Z8616 Personal history of COVID-19: Secondary | ICD-10-CM | POA: Diagnosis not present

## 2023-08-28 DIAGNOSIS — Z01812 Encounter for preprocedural laboratory examination: Secondary | ICD-10-CM | POA: Diagnosis not present

## 2023-08-28 DIAGNOSIS — Z7982 Long term (current) use of aspirin: Secondary | ICD-10-CM | POA: Diagnosis not present

## 2023-08-28 DIAGNOSIS — J449 Chronic obstructive pulmonary disease, unspecified: Secondary | ICD-10-CM | POA: Diagnosis not present

## 2023-08-28 DIAGNOSIS — K219 Gastro-esophageal reflux disease without esophagitis: Secondary | ICD-10-CM | POA: Diagnosis not present

## 2023-08-28 DIAGNOSIS — K581 Irritable bowel syndrome with constipation: Secondary | ICD-10-CM | POA: Diagnosis not present

## 2023-08-28 DIAGNOSIS — Z7984 Long term (current) use of oral hypoglycemic drugs: Secondary | ICD-10-CM | POA: Diagnosis not present

## 2023-08-28 MED ORDER — QUETIAPINE FUMARATE 300 MG PO TABS: 600.0000 mg | ORAL_TABLET | Freq: Every day | ORAL | 1 refills | Status: DC

## 2023-08-28 MED ORDER — BUPROPION HCL ER (SR) 100 MG PO TB12: 200.0000 mg | ORAL_TABLET | Freq: Two times a day (BID) | ORAL | 1 refills | Status: DC

## 2023-08-28 NOTE — Telephone Encounter (Signed)
 Sent RF as recommended to [facility].

## 2023-08-28 NOTE — Telephone Encounter (Signed)
 I agree w/ pharmacist. Let's change the Seroquel from XR to IR 300 mg, 2 at bedtime, #60 RF 1.  Change Wellbutrin to 100 mg, #120, 2 po bid RF 1

## 2023-09-01 DIAGNOSIS — M461 Sacroiliitis, not elsewhere classified: Secondary | ICD-10-CM | POA: Diagnosis not present

## 2023-09-01 DIAGNOSIS — M961 Postlaminectomy syndrome, not elsewhere classified: Secondary | ICD-10-CM | POA: Diagnosis not present

## 2023-09-01 DIAGNOSIS — G894 Chronic pain syndrome: Secondary | ICD-10-CM | POA: Diagnosis not present

## 2023-09-01 DIAGNOSIS — M542 Cervicalgia: Secondary | ICD-10-CM | POA: Diagnosis not present

## 2023-09-01 DIAGNOSIS — M51362 Other intervertebral disc degeneration, lumbar region with discogenic back pain and lower extremity pain: Secondary | ICD-10-CM | POA: Diagnosis not present

## 2023-09-01 DIAGNOSIS — M4726 Other spondylosis with radiculopathy, lumbar region: Secondary | ICD-10-CM | POA: Diagnosis not present

## 2023-09-01 DIAGNOSIS — Z79891 Long term (current) use of opiate analgesic: Secondary | ICD-10-CM | POA: Diagnosis not present

## 2023-09-07 ENCOUNTER — Other Ambulatory Visit: Payer: Self-pay

## 2023-09-07 MED ORDER — METFORMIN HCL 500 MG PO TABS: ORAL_TABLET | ORAL | 0 refills | Status: DC

## 2023-09-08 ENCOUNTER — Telehealth: Payer: Self-pay | Admitting: Physician Assistant

## 2023-09-08 ENCOUNTER — Telehealth (HOSPITAL_COMMUNITY): Payer: Self-pay

## 2023-09-08 NOTE — Telephone Encounter (Signed)
 Pt called bc she has an upcoming hernia surgery with Dr. Buzzy Han. She wanted to know about holding her Aspirin. I did let her know that we would need a request from the office faxed over. Will wait for clearance request. AB

## 2023-09-08 NOTE — Telephone Encounter (Signed)
 Confusion with the pharmacy whether the Wellbutrin SR 100mg  can be crushed as well. Pt says pharmacy says that can't as well. See previous message.

## 2023-09-09 ENCOUNTER — Telehealth (HOSPITAL_COMMUNITY): Payer: Self-pay

## 2023-09-09 NOTE — Telephone Encounter (Signed)
-----   Message from Sable Feil sent at 09/09/2023 12:32 PM EDT ----- Regarding: RE: medication clearance Hello again!  Per Dr. Corliss Skains, [facility] to titrate down from Aspirin 325 to Aspirin 81 throughout procedure. Ok to titrate Aspirin down 5 days prior to procedure. If Aspirin needs to be discontinued, should bridge with Heparin drip. For clearance, please fax form to APP office at [facility] and I will get him to sign it and fax it back.   Thank you! Charles. ----- Message ----- From: Sharee Pimple Sent: 09/09/2023   9:08 AM EDT To: Sable Feil, PA-C Subject: medication clearance                           Morning,   This pt called to find out if it was ok for her to hold her Aspirin 325 7-10 days prior to an upcoming robotic paraesophageal surgery. I did call the office and the receptionist said that she would need to hold 325 and continue Aspirin 81 5 days prior. Just wanted to make sure Dev is fine with this?  Thanks,  Fara Boros

## 2023-09-09 NOTE — Telephone Encounter (Signed)
 Pt was having some medications changed because she was needs to crush them due to abdominal surgery. She was on Wellbutrin XR. I sent in RF for Wellbutrin SR. She did not pick it up.  Should it just be regular Wellbutrin. She reports pharmacist told her that neither SR or plain Wellbutrin should be crushed.

## 2023-09-10 NOTE — Telephone Encounter (Signed)
 Yes, Wellbutrin (IR) 100 mg, 2 po bid.

## 2023-09-11 ENCOUNTER — Other Ambulatory Visit: Payer: Self-pay

## 2023-09-11 MED ORDER — BUPROPION HCL 100 MG PO TABS: 100.0000 mg | ORAL_TABLET | Freq: Two times a day (BID) | ORAL | 1 refills | Status: DC

## 2023-09-11 NOTE — Telephone Encounter (Signed)
 Sent Wellbutrin 100 mg, #60.

## 2023-09-12 DIAGNOSIS — H52203 Unspecified astigmatism, bilateral: Secondary | ICD-10-CM | POA: Diagnosis not present

## 2023-09-12 DIAGNOSIS — H40013 Open angle with borderline findings, low risk, bilateral: Secondary | ICD-10-CM | POA: Diagnosis not present

## 2023-09-12 DIAGNOSIS — H25813 Combined forms of age-related cataract, bilateral: Secondary | ICD-10-CM | POA: Diagnosis not present

## 2023-09-12 DIAGNOSIS — H02834 Dermatochalasis of left upper eyelid: Secondary | ICD-10-CM | POA: Diagnosis not present

## 2023-09-12 DIAGNOSIS — H02831 Dermatochalasis of right upper eyelid: Secondary | ICD-10-CM | POA: Diagnosis not present

## 2023-09-12 DIAGNOSIS — H5010 Unspecified exotropia: Secondary | ICD-10-CM | POA: Diagnosis not present

## 2023-09-12 DIAGNOSIS — H526 Other disorders of refraction: Secondary | ICD-10-CM | POA: Diagnosis not present

## 2023-09-19 DIAGNOSIS — R1011 Right upper quadrant pain: Secondary | ICD-10-CM | POA: Diagnosis not present

## 2023-09-19 DIAGNOSIS — Z87891 Personal history of nicotine dependence: Secondary | ICD-10-CM | POA: Diagnosis not present

## 2023-09-19 DIAGNOSIS — Z884 Allergy status to anesthetic agent status: Secondary | ICD-10-CM | POA: Diagnosis not present

## 2023-09-19 DIAGNOSIS — E7800 Pure hypercholesterolemia, unspecified: Secondary | ICD-10-CM | POA: Diagnosis not present

## 2023-09-19 DIAGNOSIS — Z7984 Long term (current) use of oral hypoglycemic drugs: Secondary | ICD-10-CM | POA: Diagnosis not present

## 2023-09-19 DIAGNOSIS — K449 Diaphragmatic hernia without obstruction or gangrene: Secondary | ICD-10-CM | POA: Diagnosis not present

## 2023-09-19 DIAGNOSIS — Z885 Allergy status to narcotic agent status: Secondary | ICD-10-CM | POA: Diagnosis not present

## 2023-09-19 DIAGNOSIS — Z9049 Acquired absence of other specified parts of digestive tract: Secondary | ICD-10-CM | POA: Diagnosis not present

## 2023-09-19 DIAGNOSIS — G8918 Other acute postprocedural pain: Secondary | ICD-10-CM | POA: Diagnosis not present

## 2023-09-19 DIAGNOSIS — Z79899 Other long term (current) drug therapy: Secondary | ICD-10-CM | POA: Diagnosis not present

## 2023-09-19 DIAGNOSIS — Z7982 Long term (current) use of aspirin: Secondary | ICD-10-CM | POA: Diagnosis not present

## 2023-09-19 DIAGNOSIS — K219 Gastro-esophageal reflux disease without esophagitis: Secondary | ICD-10-CM | POA: Diagnosis not present

## 2023-09-19 DIAGNOSIS — Z886 Allergy status to analgesic agent status: Secondary | ICD-10-CM | POA: Diagnosis not present

## 2023-09-19 DIAGNOSIS — K589 Irritable bowel syndrome without diarrhea: Secondary | ICD-10-CM | POA: Diagnosis not present

## 2023-09-19 DIAGNOSIS — Z888 Allergy status to other drugs, medicaments and biological substances status: Secondary | ICD-10-CM | POA: Diagnosis not present

## 2023-09-19 DIAGNOSIS — M51369 Other intervertebral disc degeneration, lumbar region without mention of lumbar back pain or lower extremity pain: Secondary | ICD-10-CM | POA: Diagnosis not present

## 2023-09-19 DIAGNOSIS — J449 Chronic obstructive pulmonary disease, unspecified: Secondary | ICD-10-CM | POA: Diagnosis not present

## 2023-09-20 DIAGNOSIS — Z7984 Long term (current) use of oral hypoglycemic drugs: Secondary | ICD-10-CM | POA: Diagnosis not present

## 2023-09-20 DIAGNOSIS — K219 Gastro-esophageal reflux disease without esophagitis: Secondary | ICD-10-CM | POA: Diagnosis not present

## 2023-09-20 DIAGNOSIS — Z79899 Other long term (current) drug therapy: Secondary | ICD-10-CM | POA: Diagnosis not present

## 2023-09-20 DIAGNOSIS — K589 Irritable bowel syndrome without diarrhea: Secondary | ICD-10-CM | POA: Diagnosis not present

## 2023-09-20 DIAGNOSIS — J449 Chronic obstructive pulmonary disease, unspecified: Secondary | ICD-10-CM | POA: Diagnosis not present

## 2023-09-20 DIAGNOSIS — Z885 Allergy status to narcotic agent status: Secondary | ICD-10-CM | POA: Diagnosis not present

## 2023-09-20 DIAGNOSIS — M51369 Other intervertebral disc degeneration, lumbar region without mention of lumbar back pain or lower extremity pain: Secondary | ICD-10-CM | POA: Diagnosis not present

## 2023-09-20 DIAGNOSIS — Z87891 Personal history of nicotine dependence: Secondary | ICD-10-CM | POA: Diagnosis not present

## 2023-09-20 DIAGNOSIS — R1011 Right upper quadrant pain: Secondary | ICD-10-CM | POA: Diagnosis not present

## 2023-09-20 DIAGNOSIS — Z9049 Acquired absence of other specified parts of digestive tract: Secondary | ICD-10-CM | POA: Diagnosis not present

## 2023-09-20 DIAGNOSIS — Z7982 Long term (current) use of aspirin: Secondary | ICD-10-CM | POA: Diagnosis not present

## 2023-09-20 DIAGNOSIS — E7800 Pure hypercholesterolemia, unspecified: Secondary | ICD-10-CM | POA: Diagnosis not present

## 2023-09-20 DIAGNOSIS — Z886 Allergy status to analgesic agent status: Secondary | ICD-10-CM | POA: Diagnosis not present

## 2023-09-20 DIAGNOSIS — Z888 Allergy status to other drugs, medicaments and biological substances status: Secondary | ICD-10-CM | POA: Diagnosis not present

## 2023-09-20 DIAGNOSIS — Z884 Allergy status to anesthetic agent status: Secondary | ICD-10-CM | POA: Diagnosis not present

## 2023-09-20 DIAGNOSIS — K449 Diaphragmatic hernia without obstruction or gangrene: Secondary | ICD-10-CM | POA: Diagnosis not present

## 2023-10-08 DIAGNOSIS — G894 Chronic pain syndrome: Secondary | ICD-10-CM | POA: Diagnosis not present

## 2023-10-08 DIAGNOSIS — Z79891 Long term (current) use of opiate analgesic: Secondary | ICD-10-CM | POA: Diagnosis not present

## 2023-10-08 DIAGNOSIS — Z5181 Encounter for therapeutic drug level monitoring: Secondary | ICD-10-CM | POA: Diagnosis not present

## 2023-10-11 ENCOUNTER — Other Ambulatory Visit: Payer: Self-pay | Admitting: Internal Medicine

## 2023-10-23 DIAGNOSIS — G894 Chronic pain syndrome: Secondary | ICD-10-CM | POA: Diagnosis not present

## 2023-10-23 DIAGNOSIS — M4726 Other spondylosis with radiculopathy, lumbar region: Secondary | ICD-10-CM | POA: Diagnosis not present

## 2023-10-23 DIAGNOSIS — M461 Sacroiliitis, not elsewhere classified: Secondary | ICD-10-CM | POA: Diagnosis not present

## 2023-10-23 DIAGNOSIS — M542 Cervicalgia: Secondary | ICD-10-CM | POA: Diagnosis not present

## 2023-10-23 DIAGNOSIS — M961 Postlaminectomy syndrome, not elsewhere classified: Secondary | ICD-10-CM | POA: Diagnosis not present

## 2023-10-23 DIAGNOSIS — Z79891 Long term (current) use of opiate analgesic: Secondary | ICD-10-CM | POA: Diagnosis not present

## 2023-10-23 DIAGNOSIS — M51362 Other intervertebral disc degeneration, lumbar region with discogenic back pain and lower extremity pain: Secondary | ICD-10-CM | POA: Diagnosis not present

## 2023-10-25 ENCOUNTER — Other Ambulatory Visit: Payer: Self-pay | Admitting: Internal Medicine

## 2023-10-27 ENCOUNTER — Other Ambulatory Visit: Payer: Self-pay

## 2023-10-28 ENCOUNTER — Ambulatory Visit (INDEPENDENT_AMBULATORY_CARE_PROVIDER_SITE_OTHER): Payer: Medicare Other | Admitting: Physician Assistant

## 2023-10-28 DIAGNOSIS — R112 Nausea with vomiting, unspecified: Secondary | ICD-10-CM | POA: Diagnosis not present

## 2023-10-28 DIAGNOSIS — K5900 Constipation, unspecified: Secondary | ICD-10-CM | POA: Diagnosis not present

## 2023-10-28 DIAGNOSIS — Z8719 Personal history of other diseases of the digestive system: Secondary | ICD-10-CM | POA: Diagnosis not present

## 2023-10-28 DIAGNOSIS — Z9889 Other specified postprocedural states: Secondary | ICD-10-CM | POA: Diagnosis not present

## 2023-10-28 DIAGNOSIS — K219 Gastro-esophageal reflux disease without esophagitis: Secondary | ICD-10-CM | POA: Diagnosis not present

## 2023-10-28 DIAGNOSIS — Z860101 Personal history of adenomatous and serrated colon polyps: Secondary | ICD-10-CM | POA: Diagnosis not present

## 2023-10-29 ENCOUNTER — Encounter: Payer: Self-pay | Admitting: Physician Assistant

## 2023-10-29 ENCOUNTER — Ambulatory Visit: Admitting: Physician Assistant

## 2023-10-29 DIAGNOSIS — F99 Mental disorder, not otherwise specified: Secondary | ICD-10-CM | POA: Diagnosis not present

## 2023-10-29 DIAGNOSIS — F259 Schizoaffective disorder, unspecified: Secondary | ICD-10-CM | POA: Diagnosis not present

## 2023-10-29 DIAGNOSIS — F5105 Insomnia due to other mental disorder: Secondary | ICD-10-CM | POA: Diagnosis not present

## 2023-10-29 DIAGNOSIS — F411 Generalized anxiety disorder: Secondary | ICD-10-CM | POA: Diagnosis not present

## 2023-10-29 MED ORDER — QUETIAPINE FUMARATE 300 MG PO TABS: 600.0000 mg | ORAL_TABLET | Freq: Every day | ORAL | 3 refills | Status: AC

## 2023-10-29 NOTE — Progress Notes (Signed)
 [facility]  Patient ID: [name],  MRN: [id number]  PCP: Roslyn Coombe, MD  Date of Evaluation: 10/29/2023 Time spent:20 minutes  Chief Complaint:  Chief Complaint   Depression; Insomnia; Follow-up    HISTORY/CURRENT STATUS: HPI  For routine med check.  Had surgery in April, esophagus stretched and HH repair. Still on liquids but will be able to progress diet next week. She feels much better mentally since she's not nauseated and vomiting all the time. We changed the Wellbutrin  to IR. Also Seroquel  to IR. She's able to swallow pills now, doesn't need to crush them.  She lacks the Seroquel  instant release better than the XR, it is more effective to help her sleep and with her mood.  It is also much cheaper.  She would like to go back to the Wellbutrin  XL.  States she feels like a new person after having the surgery.  She is able to sleep normally now whereas there were times she could not sleep because she was sick to her stomach.  Patient is able to enjoy things.  Energy and motivation are better.  No extreme sadness, tearfulness, or feelings of hopelessness. ADLs and personal hygiene are normal.   Denies any changes in concentration, making decisions, or remembering things.  Appetite has not changed.  Weight is stable.  No complaints of anxiety.  Denies suicidal or homicidal thoughts.  Patient denies increased energy with decreased need for sleep, increased talkativeness, racing thoughts, impulsivity or risky behaviors, increased spending, increased libido, grandiosity, increased irritability or anger, paranoia, or hallucinations.  Denies dizziness, syncope, seizures, numbness, tingling, tremor, tics, unsteady gait, slurred speech, confusion.  Has chronic multi joint pain, uses a cane when she is outside her home.  Individual Medical History/ Review of Systems: Changes? :Yes  had surgery for esophageal hernia , biologic mesh, gastropexy  Past medications for mental  health diagnoses include: Ingrezza was ineffective, Lamictal , Risperdal , Zyprexa , Cogentin , Latuda , Cymbalta , lithium , Ambien , prazosin , Ativan, Wellbutrin , Zoloft, and probably others, trazodone  possibly caused double vision and a fall. Seroquel , Gabapentin  didn't help anxiety, Mirtazepine caused dizziness  Allergies: Ambien  [zolpidem  tartrate]; Anesthetics, halogenated; Hydrocodone ; Lactose intolerance (gi); Nsaids; Propoxyphene n-acetaminophen ; Tramadol; and Chantix  [varenicline ]  Current Medications:  Current Outpatient Medications:    albuterol  (PROVENTIL ) (2.5 MG/3ML) 0.083% nebulizer solution, , Disp: , Rfl:    aspirin  325 MG tablet, Take 325 mg by mouth daily., Disp: , Rfl:    atorvastatin  (LIPITOR) 40 MG tablet, TAKE 1 TABLET BY MOUTH DAILY, Disp: 100 tablet, Rfl: 2   bisacodyl (DULCOLAX) 5 MG EC tablet, Take by mouth., Disp: , Rfl:    buPROPion  (WELLBUTRIN  XL) 150 MG 24 hr tablet, Take 1 tablet (150 mg total) by mouth daily., Disp: 90 tablet, Rfl: 3   buPROPion  (WELLBUTRIN  XL) 300 MG 24 hr tablet, Take 1 tablet (300 mg total) by mouth daily., Disp: 90 tablet, Rfl: 3   cetirizine  (ZYRTEC ) 10 MG tablet, Take by mouth., Disp: , Rfl:    Cholecalciferol (VITAMIN D ) 50 MCG (2000 UT) tablet, Take 2,000 Units by mouth daily., Disp: , Rfl:    diphenhydrAMINE (BENADRYL) 25 MG tablet, Take by mouth., Disp: , Rfl:    diphenhydramine-acetaminophen  (TYLENOL  PM) 25-500 MG TABS tablet, Take 1 tablet by mouth at bedtime as needed., Disp: , Rfl:    FLONASE ALLERGY RELIEF 50 MCG/ACT nasal spray, , Disp: , Rfl:    FLUCELVAX QUADRIVALENT SUSP injection, , Disp: , Rfl:    ibuprofen (ADVIL) 200 MG tablet,  Take by mouth., Disp: , Rfl:    lamoTRIgine  (LAMICTAL ) 100 MG tablet, TAKE 1 TABLET BY MOUTH IN THE  MORNING AND 3 TABLETS BY MOUTH  AT BEDTIME, Disp: 400 tablet, Rfl: 2   metFORMIN  (GLUCOPHAGE ) 500 MG tablet, ONE  TABLET  TWICE  DAILY  WITH  FOOD, Disp: 180 tablet, Rfl: 0   omeprazole (PRILOSEC) 40 MG  capsule, Take 40 mg by mouth 2 (two) times daily., Disp: , Rfl:    Oxycodone  HCl 10 MG TABS, Take 1 tablet by mouth every 6 (six) hours as needed (pain). , Disp: , Rfl:    potassium chloride  (KLOR-CON  M) 10 MEQ tablet, TAKE 1 TABLET BY MOUTH DAILY, Disp: 90 tablet, Rfl: 3   promethazine  (PHENERGAN ) 12.5 MG tablet, Take 1 tablet by mouth  every 6 hours as needed for nausea, Disp: 90 tablet, Rfl: 0   sucralfate  (CARAFATE ) 1 g tablet, TAKE ONE TABLET BY MOUTH FOUR TIMES DAILY, Disp: 120 tablet, Rfl: 0   Tenapanor HCl (IBSRELA) 50 MG TABS, Take by mouth., Disp: , Rfl:    vitamin B-12 (CYANOCOBALAMIN ) 1000 MCG tablet, Take 1 tablet (1,000 mcg total) by mouth daily., Disp: 90 tablet, Rfl: 3   methocarbamol  (ROBAXIN ) 500 MG tablet, , Disp: , Rfl:    naloxone (NARCAN) 4 MG/0.1ML LIQD nasal spray kit, Place 0.4 mg into the nose as needed (opioid overdose). (Patient not taking: Reported on 12/17/2022), Disp: , Rfl:    PFIZER COVID-19 VAC BIVALENT injection, , Disp: , Rfl:    Probiotic Product (PROBIOTIC DAILY PO), Take by mouth. (Patient not taking: Reported on 10/29/2023), Disp: , Rfl:    QUEtiapine  (SEROQUEL ) 300 MG tablet, Take 2 tablets (600 mg total) by mouth at bedtime., Disp: 180 tablet, Rfl: 3 Medication Side Effects: weight gain  Family Medical/ Social History: Changes?     MENTAL HEALTH EXAM:  There were no vitals taken for this visit.There is no height or weight on file to calculate BMI.  General Appearance: Casual and Well Groomed  Eye Contact:  Good  Speech:  Clear and Coherent and Normal Rate  Volume:  Normal  Mood:  Euthymic  Affect:  Congruent  Thought Process:  Goal Directed and Descriptions of Associations: Circumstantial  Orientation:  Full (Time, Place, and Person)  Thought Content: Logical   Suicidal Thoughts:  No  Homicidal Thoughts:  No  Memory:  WNL  Judgement:  Good  Insight:  Good  Psychomotor Activity:  Walks slowly with a cane  Concentration:  Concentration: Good   Recall:  Good  Fund of Knowledge: Good  Language: Good  Assets:  Desire for Improvement Financial Resources/Insurance Housing Resilience  ADL's:  Intact  Cognition: WNL  Prognosis:  Good   DIAGNOSES:    ICD-10-CM   1. Schizoaffective disorder, unspecified type (HCC)  F25.9     2. Generalized anxiety disorder  F41.1     3. Insomnia due to other mental disorder  F51.05    F99       Receiving Psychotherapy: No   RECOMMENDATIONS:  PDMP reviewed.  Oxycodone  known to me.   I provided 20 minutes of face to face time during this encounter, including time spent before and after the visit in records review, medical decision making, counseling pertinent to today's visit, and charting.   I am glad to see if she is feeling better.  It is fine to change the Seroquel  to IR permanently.  Will change the Wellbutrin  back to XL.  Restart Wellbutrin  XL  150 mg +300 mg. Continue Lamictal  100 mg, 1 p.o. every morning and 3 p.o. nightly. Continue metformin  500 mg, 1 p.o. twice daily.  Take with food. Continue Seroquel  IR 300 mg, 2 p.o. nightly. Continue vitamins as per med list. Return in 6 months.  Marvia Slocumb, PA-C

## 2023-12-05 ENCOUNTER — Other Ambulatory Visit: Payer: Self-pay | Admitting: Internal Medicine

## 2023-12-11 DIAGNOSIS — M461 Sacroiliitis, not elsewhere classified: Secondary | ICD-10-CM | POA: Diagnosis not present

## 2023-12-11 DIAGNOSIS — Z79891 Long term (current) use of opiate analgesic: Secondary | ICD-10-CM | POA: Diagnosis not present

## 2023-12-11 DIAGNOSIS — M961 Postlaminectomy syndrome, not elsewhere classified: Secondary | ICD-10-CM | POA: Diagnosis not present

## 2023-12-11 DIAGNOSIS — M542 Cervicalgia: Secondary | ICD-10-CM | POA: Diagnosis not present

## 2023-12-11 DIAGNOSIS — M4726 Other spondylosis with radiculopathy, lumbar region: Secondary | ICD-10-CM | POA: Diagnosis not present

## 2023-12-11 DIAGNOSIS — M51362 Other intervertebral disc degeneration, lumbar region with discogenic back pain and lower extremity pain: Secondary | ICD-10-CM | POA: Diagnosis not present

## 2023-12-11 DIAGNOSIS — G894 Chronic pain syndrome: Secondary | ICD-10-CM | POA: Diagnosis not present

## 2023-12-12 ENCOUNTER — Encounter (HOSPITAL_COMMUNITY): Payer: Self-pay | Admitting: Interventional Radiology

## 2024-01-06 ENCOUNTER — Other Ambulatory Visit: Payer: Self-pay

## 2024-01-06 MED ORDER — METFORMIN HCL 500 MG PO TABS: ORAL_TABLET | ORAL | 0 refills | Status: DC

## 2024-01-08 ENCOUNTER — Other Ambulatory Visit: Payer: Self-pay | Admitting: Internal Medicine

## 2024-01-08 ENCOUNTER — Other Ambulatory Visit: Payer: Self-pay | Admitting: Physician Assistant

## 2024-01-09 ENCOUNTER — Telehealth: Payer: Self-pay | Admitting: Internal Medicine

## 2024-01-09 NOTE — Telephone Encounter (Signed)
 Copied from CRM #[id number]. Topic: General - Other >> Jan 09, 2024 11:25 AM Carlyon D wrote: Reason for CRM: Home provider Seen pt in home Wanted to update Dr. Norleen that pt has reported an increase in recent falls. Pt is high fall risk .

## 2024-01-09 NOTE — Telephone Encounter (Signed)
 Ok this is noted, no  new orders, but please ask pt to make ROV

## 2024-01-12 NOTE — Telephone Encounter (Signed)
 Called and scheduled patient for ROV

## 2024-01-21 ENCOUNTER — Ambulatory Visit (INDEPENDENT_AMBULATORY_CARE_PROVIDER_SITE_OTHER): Admitting: Internal Medicine

## 2024-01-21 ENCOUNTER — Other Ambulatory Visit: Payer: Self-pay | Admitting: Internal Medicine

## 2024-01-21 ENCOUNTER — Ambulatory Visit: Payer: Self-pay | Admitting: Internal Medicine

## 2024-01-21 ENCOUNTER — Encounter: Payer: Self-pay | Admitting: Internal Medicine

## 2024-01-21 VITALS — BP 134/82 | HR 78 | Temp 97.8°F | Ht 64.0 in | Wt 137.2 lb

## 2024-01-21 DIAGNOSIS — E7800 Pure hypercholesterolemia, unspecified: Secondary | ICD-10-CM | POA: Diagnosis not present

## 2024-01-21 DIAGNOSIS — R739 Hyperglycemia, unspecified: Secondary | ICD-10-CM

## 2024-01-21 DIAGNOSIS — J309 Allergic rhinitis, unspecified: Secondary | ICD-10-CM

## 2024-01-21 DIAGNOSIS — F17200 Nicotine dependence, unspecified, uncomplicated: Secondary | ICD-10-CM | POA: Diagnosis not present

## 2024-01-21 DIAGNOSIS — J438 Other emphysema: Secondary | ICD-10-CM | POA: Diagnosis not present

## 2024-01-21 DIAGNOSIS — E559 Vitamin D deficiency, unspecified: Secondary | ICD-10-CM

## 2024-01-21 DIAGNOSIS — D649 Anemia, unspecified: Secondary | ICD-10-CM

## 2024-01-21 DIAGNOSIS — E538 Deficiency of other specified B group vitamins: Secondary | ICD-10-CM | POA: Diagnosis not present

## 2024-01-21 DIAGNOSIS — E2839 Other primary ovarian failure: Secondary | ICD-10-CM

## 2024-01-21 DIAGNOSIS — Z0000 Encounter for general adult medical examination without abnormal findings: Secondary | ICD-10-CM | POA: Diagnosis not present

## 2024-01-21 DIAGNOSIS — Z0001 Encounter for general adult medical examination with abnormal findings: Secondary | ICD-10-CM

## 2024-01-21 LAB — CBC WITH DIFFERENTIAL/PLATELET
Basophils Absolute: 0 K/uL (ref 0.0–0.1)
Basophils Relative: 0.8 % (ref 0.0–3.0)
Eosinophils Absolute: 0.1 K/uL (ref 0.0–0.7)
Eosinophils Relative: 2.5 % (ref 0.0–5.0)
HCT: 31.4 % — ABNORMAL LOW (ref 36.0–46.0)
Hemoglobin: 10.2 g/dL — ABNORMAL LOW (ref 12.0–15.0)
Lymphocytes Relative: 17.2 % (ref 12.0–46.0)
Lymphs Abs: 0.8 K/uL (ref 0.7–4.0)
MCHC: 32.3 g/dL (ref 30.0–36.0)
MCV: 86.2 fl (ref 78.0–100.0)
Monocytes Absolute: 0.5 K/uL (ref 0.1–1.0)
Monocytes Relative: 11.2 % (ref 3.0–12.0)
Neutro Abs: 3.2 K/uL (ref 1.4–7.7)
Neutrophils Relative %: 68.3 % (ref 43.0–77.0)
Platelets: 410 K/uL — ABNORMAL HIGH (ref 150.0–400.0)
RBC: 3.65 Mil/uL — ABNORMAL LOW (ref 3.87–5.11)
RDW: 17 % — ABNORMAL HIGH (ref 11.5–15.5)
WBC: 4.7 K/uL (ref 4.0–10.5)

## 2024-01-21 LAB — LIPID PANEL
Cholesterol: 138 mg/dL (ref 0–200)
HDL: 72.8 mg/dL (ref >39.00)
LDL Cholesterol: 52 mg/dL (ref 0–99)
NonHDL: 65.53
Total CHOL/HDL Ratio: 2
Triglycerides: 66 mg/dL (ref 0.0–149.0)
VLDL: 13.2 mg/dL (ref 0.0–40.0)

## 2024-01-21 LAB — HEPATIC FUNCTION PANEL
ALT: 21 U/L (ref 0–35)
AST: 16 U/L (ref 0–37)
Albumin: 4.1 g/dL (ref 3.5–5.2)
Alkaline Phosphatase: 75 U/L (ref 39–117)
Bilirubin, Direct: 0 mg/dL (ref 0.0–0.3)
Total Bilirubin: 0.3 mg/dL (ref 0.2–1.2)
Total Protein: 6.5 g/dL (ref 6.0–8.3)

## 2024-01-21 LAB — TSH: TSH: 2.6 u[IU]/mL (ref 0.35–5.50)

## 2024-01-21 LAB — BASIC METABOLIC PANEL WITH GFR
BUN: 12 mg/dL (ref 6–23)
CO2: 29 meq/L (ref 19–32)
Calcium: 9.6 mg/dL (ref 8.4–10.5)
Chloride: 96 meq/L (ref 96–112)
Creatinine, Ser: 0.83 mg/dL (ref 0.40–1.20)
GFR: 73.76 mL/min (ref >60.00)
Glucose, Bld: 100 mg/dL — ABNORMAL HIGH (ref 70–99)
Potassium: 5 meq/L (ref 3.5–5.1)
Sodium: 132 meq/L — ABNORMAL LOW (ref 135–145)

## 2024-01-21 LAB — URINALYSIS, ROUTINE W REFLEX MICROSCOPIC
Bilirubin Urine: NEGATIVE
Hgb urine dipstick: NEGATIVE
Ketones, ur: NEGATIVE
Leukocytes,Ua: NEGATIVE
Nitrite: NEGATIVE
RBC / HPF: NONE SEEN (ref 0–?)
Specific Gravity, Urine: 1.015 (ref 1.000–1.030)
Total Protein, Urine: NEGATIVE
Urine Glucose: NEGATIVE
Urobilinogen, UA: 0.2 (ref 0.0–1.0)
WBC, UA: NONE SEEN (ref 0–?)
pH: 8 (ref 5.0–8.0)

## 2024-01-21 LAB — VITAMIN B12: Vitamin B-12: >1500 pg/mL — ABNORMAL HIGH (ref 211–911)

## 2024-01-21 LAB — VITAMIN D 25 HYDROXY (VIT D DEFICIENCY, FRACTURES): VITD: 47.57 ng/mL (ref 30.00–100.00)

## 2024-01-21 LAB — HEMOGLOBIN A1C: Hgb A1c MFr Bld: 6.1 % (ref 4.6–6.5)

## 2024-01-21 NOTE — Progress Notes (Unsigned)
 Patient ID: STARLET GALLENTINE, female   DOB: 1956-03-08, 68 y.o.   MRN: [id number]         Chief Complaint:: wellness exam and low vit d, smoker now quit, hyperglycemia, hld, low b12, copd       HPI:  NUPUR HOHMAN is a 68 y.o. female here for wellness exam with family; due for DXA, o/w up tod ate                        Also s/p nissen fundoplication Sep 19 223 at [facility].    May need cataract surgury bilateral soon, has f/u planned. Sees psychiatry regularly with parkinsomism resolved with med change.  Pt denies chest pain, increased sob or doe, wheezing, orthopnea, PND, increased LE swelling, palpitations, dizziness or syncope.   Pt denies polydipsia, polyuria, or new focal neuro s/s.    Pt denies fever, wt loss, night sweats, loss of appetite, or other constitutional symptoms  Does have several wks ongoing nasal allergy symptoms with clearish congestion, itch and sneezing, without fever, pain, ST, cough, swelling or wheezing.  Wt Readings from Last 3 Encounters:  01/21/24 137 lb 3.2 oz (62.2 kg)  03/06/23 144 lb (65.3 kg)  11/12/22 158 lb (71.7 kg)   BP Readings from Last 3 Encounters:  01/21/24 134/82  06/17/23 123/84  11/12/22 132/74   Immunization History  Administered Date(s) Administered   Fluad Quad(high Dose 65+) 03/31/2015, 03/01/2016, 03/21/2018, 03/05/2019   H1N1 07/11/2008   Hpv-Unspecified 03/19/2022   Influenza Inj Mdck Quad Pf 03/22/2017   Influenza Inj Mdck Quad With Preservative 03/21/2018   Influenza Split 02/20/2012, 02/27/2014, 03/19/2022   Influenza Whole 05/21/2007, 04/15/2008, 03/16/2009, 02/21/2010   Influenza,inj,Quad PF,6+ Mos 03/31/2015, 03/01/2016   Influenza,inj,quad, With Preservative 03/21/2018, 03/05/2019   Influenza-Unspecified 02/20/2012, 01/15/2014, 02/27/2014, 03/31/2015, 03/01/2016, 03/21/2018, 03/05/2019, 04/17/2020, 03/15/2021   Moderna Sars-Covid-2 Vaccination 10/05/2019, 11/04/2019, 05/17/2020   Pfizer Covid-19 Vaccine Bivalent Booster 93yrs &  up 03/01/2021   Pneumococcal Conjugate-13 03/19/2016   Pneumococcal Polysaccharide-23 05/01/2015, 08/08/2020   Pneumococcal-Unspecified 05/01/2015, 08/08/2020   Respiratory Syncytial Virus Vaccine,Recomb Aduvanted(Arexvy) 03/19/2022   Td 01/18/2008   Td (Adult), 2 Lf Tetanus Toxid, Preservative Free 01/18/2008   Tdap 11/26/2018   Zoster Recombinant(Shingrix) 10/17/2021, 11/09/2022   Health Maintenance Due  Topic Date Due   DEXA SCAN  Never done   INFLUENZA VACCINE  01/16/2024   Medicare Annual Wellness (AWV)  03/05/2024      Past Medical History:  Diagnosis Date   Allergic rhinitis    Arthritis    Brain aneurysm    Chronic abdominal pain    COPD (chronic obstructive pulmonary disease) (HCC)    denies   Depression    bipolar   Fibromyalgia    Gastroparesis    GERD (gastroesophageal reflux disease)    H/O hiatal hernia    History of colonic polyps    HLD (hyperlipidemia)    IBS (irritable bowel syndrome)    chronic constipation   Migraines    OCD (obsessive compulsive disorder)    anxiety   Peripheral neuropathy    Past Surgical History:  Procedure Laterality Date   APPENDECTOMY     CERVICAL DISC SURGERY     cholecystectomy     coil to brain aneurysm     COLONOSCOPY     ELBOW SURGERY Bilateral    tendonitis   IR ANGIO INTRA EXTRACRAN SEL COM CAROTID INNOMINATE BILAT MOD SED  07/31/2017   IR ANGIO INTRA  EXTRACRAN SEL COM CAROTID INNOMINATE BILAT MOD SED  12/30/2019   IR ANGIO VERTEBRAL SEL VERTEBRAL BILAT MOD SED  07/31/2017   IR ANGIO VERTEBRAL SEL VERTEBRAL BILAT MOD SED  12/30/2019   NECK SURGERY     OOPHORECTOMY     RADIOLOGY WITH ANESTHESIA N/A 05/10/2013   Procedure: RADIOLOGY WITH ANESTHESIA;  Surgeon: Thyra MARLA Nash, MD;  Location: [facility] OR;  Service: Radiology;  Laterality: N/A;   SHOULDER SURGERY Left    TOTAL ABDOMINAL HYSTERECTOMY     TUBAL LIGATION     VESICOVAGINAL FISTULA CLOSURE W/ TAH      reports that she has quit smoking. Her smoking  use included cigarettes and e-cigarettes. She has a 2.6 pack-year smoking history. She has never used smokeless tobacco. She reports that she does not drink alcohol and does not use drugs. family history includes Cancer in her father, mother, sister, and another family member; Coronary artery disease in an other family member; Diabetes in an other family member; Hypertension in an other family member; Leukemia in her sister; Seizures in an other family member. Allergies  Allergen Reactions   Ambien  [Zolpidem  Tartrate] Other (See Comments)    Memory issues   Anesthetics, Halogenated Nausea Only   Hydrocodone  Nausea Only    Can take it if its in a cough syrup    Lactose Intolerance (Gi)     Bloating, upset stomach    Nsaids     GI irritation, pt tolerates meloxicam  and diclofenac    Propoxyphene N-Acetaminophen  Itching   Tramadol Nausea And Vomiting   Chantix  [Varenicline ] Other (See Comments)    Makes bipolar worse agitation, depression, sick to stomach   Current Outpatient Medications on File Prior to Visit  Medication Sig Dispense Refill   albuterol  (PROVENTIL ) (2.5 MG/3ML) 0.083% nebulizer solution      aspirin  325 MG tablet Take 325 mg by mouth daily.     atorvastatin  (LIPITOR) 40 MG tablet TAKE 1 TABLET BY MOUTH DAILY 100 tablet 2   bisacodyl (DULCOLAX) 5 MG EC tablet Take by mouth.     buPROPion  (WELLBUTRIN  XL) 150 MG 24 hr tablet TAKE 1 TABLET BY MOUTH DAILY 90 tablet 1   buPROPion  (WELLBUTRIN  XL) 300 MG 24 hr tablet TAKE 1 TABLET BY MOUTH DAILY 90 tablet 1   cetirizine  (ZYRTEC ) 10 MG tablet Take by mouth.     Cholecalciferol (VITAMIN D ) 50 MCG (2000 UT) tablet Take 2,000 Units by mouth daily.     diphenhydrAMINE (BENADRYL) 25 MG tablet Take by mouth.     diphenhydramine-acetaminophen  (TYLENOL  PM) 25-500 MG TABS tablet Take 1 tablet by mouth at bedtime as needed.     FLONASE ALLERGY RELIEF 50 MCG/ACT nasal spray      FLUCELVAX QUADRIVALENT SUSP injection      ibuprofen (ADVIL)  200 MG tablet Take by mouth.     lamoTRIgine  (LAMICTAL ) 100 MG tablet TAKE 1 TABLET BY MOUTH IN THE  MORNING AND 3 TABLETS BY MOUTH  AT BEDTIME 400 tablet 1   metFORMIN  (GLUCOPHAGE ) 500 MG tablet ONE  TABLET  TWICE  DAILY  WITH  FOOD 180 tablet 0   omeprazole (PRILOSEC) 40 MG capsule Take 40 mg by mouth 2 (two) times daily.     Oxycodone  HCl 10 MG TABS Take 1 tablet by mouth every 6 (six) hours as needed (pain).      potassium chloride  (KLOR-CON  M) 10 MEQ tablet TAKE 1 TABLET BY MOUTH DAILY 60 tablet 5   promethazine  (PHENERGAN )  12.5 MG tablet Take 1 tablet by mouth  every 6 hours as needed for nausea 90 tablet 0   QUEtiapine  (SEROQUEL ) 300 MG tablet Take 2 tablets (600 mg total) by mouth at bedtime. 180 tablet 3   sucralfate  (CARAFATE ) 1 g tablet TAKE ONE TABLET BY MOUTH FOUR TIMES DAILY 120 tablet 0   vitamin B-12 (CYANOCOBALAMIN ) 1000 MCG tablet Take 1 tablet (1,000 mcg total) by mouth daily. 90 tablet 3   naloxone (NARCAN) 4 MG/0.1ML LIQD nasal spray kit Place 0.4 mg into the nose as needed (opioid overdose). (Patient not taking: Reported on 01/21/2024)     PFIZER COVID-19 VAC BIVALENT injection  (Patient not taking: Reported on 01/21/2024)     Probiotic Product (PROBIOTIC DAILY PO) Take by mouth. (Patient not taking: Reported on 01/21/2024)     No current facility-administered medications on file prior to visit.        ROS:  All others reviewed and negative.  Objective        PE:  BP 134/82   Pulse 78   Temp 97.8 F (36.6 C)   Ht 5' 4 (1.626 m)   Wt 137 lb 3.2 oz (62.2 kg)   SpO2 99%   BMI 23.55 kg/m                 Constitutional: Pt appears in NAD               HENT: Head: NCAT.                Right Ear: External ear normal.                 Left Ear: External ear normal.                Eyes: . Pupils are equal, round, and reactive to light. Conjunctivae and EOM are normal               Nose: without d/c or deformity               Neck: Neck supple. Gross normal ROM                Cardiovascular: Normal rate and regular rhythm.                 Pulmonary/Chest: Effort normal and breath sounds without rales or wheezing.                Abd:  Soft, NT, ND, + BS, no organomegaly               Neurological: Pt is alert. At baseline orientation, motor grossly intact               Skin: Skin is warm. No rashes, no other new lesions, LE edema - none               Psychiatric: Pt behavior is normal without agitation   Micro: none  Cardiac tracings I have personally interpreted today:  none  Pertinent Radiological findings (summarize): none   Lab Results  Component Value Date   WBC 4.7 01/21/2024   HGB 10.2 (L) 01/21/2024   HCT 31.4 (L) 01/21/2024   PLT 410.0 (H) 01/21/2024   GLUCOSE 100 (H) 01/21/2024   CHOL 138 01/21/2024   TRIG 66.0 01/21/2024   HDL 72.80 01/21/2024   LDLDIRECT 102.7 08/21/2011   LDLCALC 52 01/21/2024   ALT 21 01/21/2024   AST 16 01/21/2024   NA  132 (L) 01/21/2024   K 5.0 01/21/2024   CL 96 01/21/2024   CREATININE 0.83 01/21/2024   BUN 12 01/21/2024   CO2 29 01/21/2024   TSH 2.60 01/21/2024   INR 0.9 12/30/2019   HGBA1C 6.1 01/21/2024   Assessment/Plan:  CRYSTIN LECHTENBERG is a 68 y.o. White or Caucasian [1] female with  has a past medical history of Allergic rhinitis, Arthritis, Brain aneurysm, Chronic abdominal pain, COPD (chronic obstructive pulmonary disease) (HCC), Depression, Fibromyalgia, Gastroparesis, GERD (gastroesophageal reflux disease), H/O hiatal hernia, History of colonic polyps, HLD (hyperlipidemia), IBS (irritable bowel syndrome), Migraines, OCD (obsessive compulsive disorder), and Peripheral neuropathy.  Smoker Pt has quit May 08 2021!  Pt encouraged for abstinence  Encounter for well adult exam with abnormal findings Age and sex appropriate education and counseling updated with regular exercise and diet Referrals for preventative services - for DXA Immunizations addressed - none needed Smoking counseling  - none needed  -  pt quit recently Evidence for depression or other mood disorder - stable anxiety Most recent labs reviewed. I have personally reviewed and have noted: 1) the patient's medical and social history 2) The patient's current medications and supplements 3) The patient's height, weight, and BMI have been recorded in the chart   COPD (chronic obstructive pulmonary disease) Stable overall, cont inhaler prn  Vitamin D  deficiency Last vitamin D  Lab Results  Component Value Date   VD25OH 47.57 01/21/2024   Stable, cont oral replacement   Hyperglycemia Lab Results  Component Value Date   HGBA1C 6.1 01/21/2024   Stable, pt to continue current medical treatment metformin  500 mg bid    HLD (hyperlipidemia) Lab Results  Component Value Date   LDLCALC 52 01/21/2024   Stable, pt to continue current statin lipitor 40 mg qd   B12 deficiency Lab Results  Component Value Date   VITAMINB12 >1500 (H) 01/21/2024   Stable, cont oral replacement - b12 1000 mcg qd   Allergic rhinitis Mild to mod, for restart zyrtec  10 every day prn,  to f/u any worsening symptoms or concerns Followup: Return in about 1 year (around 01/20/2025).  Lynwood Rush, MD 01/22/2024 1:00 PM [facility]

## 2024-01-21 NOTE — Assessment & Plan Note (Signed)
 Pt has quit May 08 2021!  Pt encouraged for abstinence

## 2024-01-21 NOTE — Patient Instructions (Signed)
 Please continue all other medications as before, and refills have been done if requested.  Please have the pharmacy call with any other refills you may need.  Please continue your efforts at being more active, low cholesterol diet, and weight control.  You are otherwise up to date with prevention measures today.  Please keep your appointments with your specialists as you may have planned  You will be contacted regarding the referral for: Bone Density testing at [facility]  Please go to the LAB at the blood drawing area for the tests to be done  You will be contacted by phone if any changes need to be made immediately.  Otherwise, you will receive a letter about your results with an explanation, but please check with MyChart first.  Please make an Appointment to return for your 1 year visit, or sooner if needed

## 2024-01-22 ENCOUNTER — Encounter: Payer: Self-pay | Admitting: Internal Medicine

## 2024-01-22 NOTE — Assessment & Plan Note (Signed)
 Lab Results  Component Value Date   VITAMINB12 >1500 (H) 01/21/2024   Stable, cont oral replacement - b12 1000 mcg qd

## 2024-01-22 NOTE — Assessment & Plan Note (Signed)
Stable overall, cont inhaler prn 

## 2024-01-22 NOTE — Assessment & Plan Note (Signed)
 Lab Results  Component Value Date   LDLCALC 52 01/21/2024   Stable, pt to continue current statin lipitor 40 mg qd

## 2024-01-22 NOTE — Assessment & Plan Note (Signed)
 Last vitamin D  Lab Results  Component Value Date   VD25OH 47.57 01/21/2024   Stable, cont oral replacement

## 2024-01-22 NOTE — Assessment & Plan Note (Signed)
Mild to mod, for restart zyrtec 10 every day prn,  to f/u any worsening symptoms or concerns

## 2024-01-22 NOTE — Assessment & Plan Note (Signed)
 Lab Results  Component Value Date   HGBA1C 6.1 01/21/2024   Stable, pt to continue current medical treatment metformin  500 mg bid

## 2024-01-22 NOTE — Assessment & Plan Note (Signed)
 Age and sex appropriate education and counseling updated with regular exercise and diet Referrals for preventative services - for DXA Immunizations addressed - none needed Smoking counseling  - none needed  - pt quit recently Evidence for depression or other mood disorder - stable anxiety Most recent labs reviewed. I have personally reviewed and have noted: 1) the patient's medical and social history 2) The patient's current medications and supplements 3) The patient's height, weight, and BMI have been recorded in the chart

## 2024-01-27 NOTE — Telephone Encounter (Signed)
 Copied from CRM [id number]. Topic: Clinical - Lab/Test Results >> Jan 27, 2024  1:33 PM Berneda FALCON wrote: Reason for CRM: Patient is returning phone call to [facility] about her lab results. Please call patient back at [phone]

## 2024-01-29 ENCOUNTER — Telehealth: Payer: Self-pay | Admitting: Physician Assistant

## 2024-01-29 DIAGNOSIS — Z8719 Personal history of other diseases of the digestive system: Secondary | ICD-10-CM | POA: Diagnosis not present

## 2024-01-29 DIAGNOSIS — Z9889 Other specified postprocedural states: Secondary | ICD-10-CM | POA: Diagnosis not present

## 2024-01-29 DIAGNOSIS — K219 Gastro-esophageal reflux disease without esophagitis: Secondary | ICD-10-CM | POA: Diagnosis not present

## 2024-01-29 DIAGNOSIS — K5900 Constipation, unspecified: Secondary | ICD-10-CM | POA: Diagnosis not present

## 2024-01-29 NOTE — Telephone Encounter (Signed)
 Pt LVM @ 4:48p stating that she had surgery on her stomach recently.  She said her son ask her to call because she's been confused.  Next appt 11/17

## 2024-01-30 ENCOUNTER — Other Ambulatory Visit: Payer: Self-pay

## 2024-01-30 ENCOUNTER — Other Ambulatory Visit (INDEPENDENT_AMBULATORY_CARE_PROVIDER_SITE_OTHER)

## 2024-01-30 DIAGNOSIS — D649 Anemia, unspecified: Secondary | ICD-10-CM | POA: Diagnosis not present

## 2024-01-30 LAB — CBC WITH DIFFERENTIAL/PLATELET
Basophils Absolute: 0 K/uL (ref 0.0–0.1)
Basophils Relative: 0.6 % (ref 0.0–3.0)
Eosinophils Absolute: 0.1 K/uL (ref 0.0–0.7)
Eosinophils Relative: 1.5 % (ref 0.0–5.0)
HCT: 29.1 % — ABNORMAL LOW (ref 36.0–46.0)
Hemoglobin: 9.5 g/dL — ABNORMAL LOW (ref 12.0–15.0)
Lymphocytes Relative: 17.9 % (ref 12.0–46.0)
Lymphs Abs: 1.3 K/uL (ref 0.7–4.0)
MCHC: 32.6 g/dL (ref 30.0–36.0)
MCV: 85.6 fl (ref 78.0–100.0)
Monocytes Absolute: 0.5 K/uL (ref 0.1–1.0)
Monocytes Relative: 7.6 % (ref 3.0–12.0)
Neutro Abs: 5.2 K/uL (ref 1.4–7.7)
Neutrophils Relative %: 72.4 % (ref 43.0–77.0)
Platelets: 452 K/uL — ABNORMAL HIGH (ref 150.0–400.0)
RBC: 3.4 Mil/uL — ABNORMAL LOW (ref 3.87–5.11)
RDW: 16.9 % — ABNORMAL HIGH (ref 11.5–15.5)
WBC: 7.2 K/uL (ref 4.0–10.5)

## 2024-01-30 LAB — IBC PANEL
Iron: 38 ug/dL — ABNORMAL LOW (ref 42–145)
Saturation Ratios: 7.5 % — ABNORMAL LOW (ref 20.0–50.0)
TIBC: 508.2 ug/dL — ABNORMAL HIGH (ref 250.0–450.0)
Transferrin: 363 mg/dL — ABNORMAL HIGH (ref 212.0–360.0)

## 2024-01-30 LAB — FERRITIN: Ferritin: 4.9 ng/mL — ABNORMAL LOW (ref 10.0–291.0)

## 2024-01-30 NOTE — Telephone Encounter (Signed)
 Reduce the Wellbutrin  to 300 mg AM bc it can make her hyper and keep her up at night and cause manic sx.

## 2024-01-30 NOTE — Telephone Encounter (Signed)
 Recommendations provided to the patient. She said the Wellbutrin  never bothered her before. Told her to check in mid week next week and let us  know how she was doing.

## 2024-01-30 NOTE — Telephone Encounter (Signed)
 [name]'s pt. Note said she was having confusion, but when I called pt she expresses cleaning her house all night. This is how her mania manifests now, but she said she wouldn't say she was manic. Used to spend money, but she has learned to control that aspect of it.  It bothers her family more than it bothers her. Speech at baseline is rapid and it didn't appear to be increased. She is on pain meds for back pain, but reports she only takes twice a day when she can take QID. She had GI surgery earlier this year and Wellbutrin  and Seroquel  were changed from XL to IR. She has gone back to XL on Wellbutrin , but felt the IR Seroquel  was better and has stayed on that.  Was last seen 5/14, FU 11/17.   Taking Wellbutrin  XL 450 mg  Lamictal  300 mg  Seroquel  IR 600 mg

## 2024-01-31 ENCOUNTER — Other Ambulatory Visit: Payer: Self-pay | Admitting: Internal Medicine

## 2024-01-31 ENCOUNTER — Ambulatory Visit: Payer: Self-pay | Admitting: Internal Medicine

## 2024-01-31 DIAGNOSIS — D509 Iron deficiency anemia, unspecified: Secondary | ICD-10-CM

## 2024-01-31 MED ORDER — POLYSACCHARIDE IRON COMPLEX 150 MG PO CAPS: 150.0000 mg | ORAL_CAPSULE | Freq: Every day | ORAL | 1 refills | Status: AC

## 2024-02-02 ENCOUNTER — Telehealth (HOSPITAL_COMMUNITY): Payer: Self-pay

## 2024-02-02 ENCOUNTER — Encounter (HOSPITAL_COMMUNITY): Payer: Self-pay

## 2024-02-02 NOTE — Telephone Encounter (Signed)
 Images pushed through powershare to [facility] and reports faxed to Dr. Hillman per pt's request. AB

## 2024-02-03 DIAGNOSIS — R918 Other nonspecific abnormal finding of lung field: Secondary | ICD-10-CM | POA: Diagnosis not present

## 2024-02-03 DIAGNOSIS — R911 Solitary pulmonary nodule: Secondary | ICD-10-CM | POA: Diagnosis not present

## 2024-02-04 DIAGNOSIS — M542 Cervicalgia: Secondary | ICD-10-CM | POA: Diagnosis not present

## 2024-02-04 DIAGNOSIS — M51362 Other intervertebral disc degeneration, lumbar region with discogenic back pain and lower extremity pain: Secondary | ICD-10-CM | POA: Diagnosis not present

## 2024-02-04 DIAGNOSIS — M4726 Other spondylosis with radiculopathy, lumbar region: Secondary | ICD-10-CM | POA: Diagnosis not present

## 2024-02-04 DIAGNOSIS — G894 Chronic pain syndrome: Secondary | ICD-10-CM | POA: Diagnosis not present

## 2024-02-04 DIAGNOSIS — Z79891 Long term (current) use of opiate analgesic: Secondary | ICD-10-CM | POA: Diagnosis not present

## 2024-02-04 DIAGNOSIS — M461 Sacroiliitis, not elsewhere classified: Secondary | ICD-10-CM | POA: Diagnosis not present

## 2024-02-04 DIAGNOSIS — M961 Postlaminectomy syndrome, not elsewhere classified: Secondary | ICD-10-CM | POA: Diagnosis not present

## 2024-02-09 NOTE — Telephone Encounter (Signed)
 Noted

## 2024-02-10 ENCOUNTER — Other Ambulatory Visit: Payer: Self-pay | Admitting: Internal Medicine

## 2024-02-10 DIAGNOSIS — Z1231 Encounter for screening mammogram for malignant neoplasm of breast: Secondary | ICD-10-CM

## 2024-02-11 ENCOUNTER — Telehealth: Payer: Self-pay

## 2024-02-11 NOTE — Progress Notes (Signed)
   02/11/2024  Patient ID: [name], female   DOB: April 08, 1956, 68 y.o.   MRN: [id number]  Attempted to contact patient for medication management/review. Left HIPAA compliant message for patient to return my call at their convenience.   This patient is appearing on a report for being at risk of failing the adherence measure for identified medications this calendar year.   Medication Adherence Summary (STAR/HEDIS Monitoring): Adherence Category: diabetes    Drug Name: Metformin  500 mg  Sold Date:02/07/2024 Days' Supply: 90   Drug Name: Atorvastatin   Last Fill or Sold Date:01/08/2024 Days' Supply: 100 - Medication not on report   Notes: ? Adherence data pulled from pharmacy claims portal Dr. Annemarie. ? Patient outreached to clarify whether she is taking atorvastatin  as there isn't a sold date. Most recent LDL 52 mg/dL.  ? Reviewed barriers to adherence: none identified. ? Plan: Follow up in early November for refills in early November  Dorcas Solian, PharmD Clinical Pharmacist Cell: [phone]

## 2024-02-19 DIAGNOSIS — D509 Iron deficiency anemia, unspecified: Secondary | ICD-10-CM | POA: Diagnosis not present

## 2024-02-25 ENCOUNTER — Ambulatory Visit: Payer: Self-pay | Admitting: Family

## 2024-02-25 ENCOUNTER — Encounter: Payer: Self-pay | Admitting: Internal Medicine

## 2024-02-25 DIAGNOSIS — H40013 Open angle with borderline findings, low risk, bilateral: Secondary | ICD-10-CM | POA: Diagnosis not present

## 2024-02-25 DIAGNOSIS — H5010 Unspecified exotropia: Secondary | ICD-10-CM | POA: Diagnosis not present

## 2024-02-25 DIAGNOSIS — H25813 Combined forms of age-related cataract, bilateral: Secondary | ICD-10-CM | POA: Diagnosis not present

## 2024-02-25 DIAGNOSIS — H52203 Unspecified astigmatism, bilateral: Secondary | ICD-10-CM | POA: Diagnosis not present

## 2024-02-25 DIAGNOSIS — E119 Type 2 diabetes mellitus without complications: Secondary | ICD-10-CM | POA: Diagnosis not present

## 2024-02-25 LAB — HM DIABETES EYE EXAM

## 2024-02-25 NOTE — Progress Notes (Signed)
 noted

## 2024-03-10 ENCOUNTER — Ambulatory Visit

## 2024-03-10 ENCOUNTER — Ambulatory Visit (INDEPENDENT_AMBULATORY_CARE_PROVIDER_SITE_OTHER)

## 2024-03-10 VITALS — Ht 64.0 in | Wt 137.0 lb

## 2024-03-10 DIAGNOSIS — E2839 Other primary ovarian failure: Secondary | ICD-10-CM | POA: Diagnosis not present

## 2024-03-10 DIAGNOSIS — Z780 Asymptomatic menopausal state: Secondary | ICD-10-CM | POA: Diagnosis not present

## 2024-03-10 DIAGNOSIS — Z87891 Personal history of nicotine dependence: Secondary | ICD-10-CM

## 2024-03-10 DIAGNOSIS — M810 Age-related osteoporosis without current pathological fracture: Secondary | ICD-10-CM | POA: Diagnosis not present

## 2024-03-10 DIAGNOSIS — Z0000 Encounter for general adult medical examination without abnormal findings: Secondary | ICD-10-CM

## 2024-03-10 NOTE — Progress Notes (Signed)
 Subjective:   [name] is a 68 y.o. who presents for a Medicare Wellness preventive visit.  As a reminder, Annual Wellness Visits don't include a physical exam, and some assessments may be limited, especially if this visit is performed virtually. We may recommend an in-person follow-up visit with your provider if needed.  Visit Complete: Virtual I connected with  [name] on 03/10/24 by a audio enabled telemedicine application and verified that I am speaking with the correct person using two identifiers.  Patient Location: Home  Provider Location: Office/Clinic  I discussed the limitations of evaluation and management by telemedicine. The patient expressed understanding and agreed to proceed.  Vital Signs: Because this visit was a virtual/telehealth visit, some criteria may be missing or patient reported. Any vitals not documented were not able to be obtained and vitals that have been documented are patient reported.  VideoDeclined- This patient declined Librarian, academic. Therefore the visit was completed with audio only.  Persons Participating in Visit: Patient.  AWV Questionnaire: No: Patient Medicare AWV questionnaire was not completed prior to this visit.  Cardiac Risk Factors include: advanced age (>62men, >50 women);dyslipidemia     Objective:    Today's Vitals   03/10/24 1528  Weight: 137 lb (62.1 kg)  Height: 5' 4 (1.626 m)   Body mass index is 23.52 kg/m.     03/10/2024    3:28 PM 03/06/2023    4:10 PM 02/11/2022   11:13 AM 02/09/2021   11:15 AM 12/30/2019    6:48 AM 12/17/2019    1:20 PM 07/31/2017    6:57 AM  Advanced Directives  Does Patient Have a Medical Advance Directive? No No No No No No No   Would patient like information on creating a medical advance directive? No - Patient declined No - Patient declined No - Patient declined No - Patient declined No - Patient declined No - Patient declined      Data saved with a  previous flowsheet row definition    Current Medications (verified) Outpatient Encounter Medications as of 03/10/2024  Medication Sig   albuterol  (PROVENTIL ) (2.5 MG/3ML) 0.083% nebulizer solution    aspirin  325 MG tablet Take 325 mg by mouth daily.   atorvastatin  (LIPITOR) 40 MG tablet TAKE 1 TABLET BY MOUTH DAILY   bisacodyl (DULCOLAX) 5 MG EC tablet Take by mouth.   buPROPion  (WELLBUTRIN  XL) 150 MG 24 hr tablet TAKE 1 TABLET BY MOUTH DAILY   buPROPion  (WELLBUTRIN  XL) 300 MG 24 hr tablet TAKE 1 TABLET BY MOUTH DAILY   cetirizine  (ZYRTEC ) 10 MG tablet Take by mouth.   Cholecalciferol (VITAMIN D ) 50 MCG (2000 UT) tablet Take 2,000 Units by mouth daily.   diphenhydrAMINE (BENADRYL) 25 MG tablet Take by mouth.   diphenhydramine-acetaminophen  (TYLENOL  PM) 25-500 MG TABS tablet Take 1 tablet by mouth at bedtime as needed.   FLONASE ALLERGY RELIEF 50 MCG/ACT nasal spray    FLUCELVAX QUADRIVALENT SUSP injection    ibuprofen (ADVIL) 200 MG tablet Take by mouth.   iron  polysaccharides (NU-IRON ) 150 MG capsule Take 1 capsule (150 mg total) by mouth daily.   lamoTRIgine  (LAMICTAL ) 100 MG tablet TAKE 1 TABLET BY MOUTH IN THE  MORNING AND 3 TABLETS BY MOUTH  AT BEDTIME   metFORMIN  (GLUCOPHAGE ) 500 MG tablet ONE  TABLET  TWICE  DAILY  WITH  FOOD   omeprazole (PRILOSEC) 40 MG capsule Take 40 mg by mouth 2 (two) times daily.   Oxycodone  HCl  10 MG TABS Take 1 tablet by mouth every 6 (six) hours as needed (pain).    potassium chloride  (KLOR-CON  M) 10 MEQ tablet TAKE 1 TABLET BY MOUTH DAILY   promethazine  (PHENERGAN ) 12.5 MG tablet Take 1 tablet by mouth  every 6 hours as needed for nausea   QUEtiapine  (SEROQUEL ) 300 MG tablet Take 2 tablets (600 mg total) by mouth at bedtime.   sucralfate  (CARAFATE ) 1 g tablet TAKE ONE TABLET BY MOUTH FOUR TIMES DAILY   tiZANidine (ZANAFLEX) 2 MG tablet Take 2 mg by mouth 2 (two) times daily as needed.   vitamin B-12 (CYANOCOBALAMIN ) 1000 MCG tablet Take 1 tablet (1,000  mcg total) by mouth daily.   naloxone (NARCAN) 4 MG/0.1ML LIQD nasal spray kit Place 0.4 mg into the nose as needed (opioid overdose). (Patient not taking: Reported on 03/10/2024)   PFIZER COVID-19 VAC BIVALENT injection  (Patient not taking: Reported on 01/21/2024)   Probiotic Product (PROBIOTIC DAILY PO) Take by mouth. (Patient not taking: Reported on 03/10/2024)   No facility-administered encounter medications on file as of 03/10/2024.    Allergies (verified) Ambien  [zolpidem  tartrate]; Anesthetics, halogenated; Hydrocodone ; Lactose intolerance (gi); Nsaids; Propoxyphene n-acetaminophen ; Tramadol; and Chantix  [varenicline ]   History: Past Medical History:  Diagnosis Date   Allergic rhinitis    Arthritis    Brain aneurysm    Chronic abdominal pain    COPD (chronic obstructive pulmonary disease) (HCC)    denies   Depression    bipolar   Fibromyalgia    Gastroparesis    GERD (gastroesophageal reflux disease)    H/O hiatal hernia    History of colonic polyps    HLD (hyperlipidemia)    IBS (irritable bowel syndrome)    chronic constipation   Migraines    OCD (obsessive compulsive disorder)    anxiety   Peripheral neuropathy    Past Surgical History:  Procedure Laterality Date   APPENDECTOMY     CERVICAL DISC SURGERY     cholecystectomy     coil to brain aneurysm     COLONOSCOPY     ELBOW SURGERY Bilateral    tendonitis   IR ANGIO INTRA EXTRACRAN SEL COM CAROTID INNOMINATE BILAT MOD SED  07/31/2017   IR ANGIO INTRA EXTRACRAN SEL COM CAROTID INNOMINATE BILAT MOD SED  12/30/2019   IR ANGIO VERTEBRAL SEL VERTEBRAL BILAT MOD SED  07/31/2017   IR ANGIO VERTEBRAL SEL VERTEBRAL BILAT MOD SED  12/30/2019   NECK SURGERY     OOPHORECTOMY     RADIOLOGY WITH ANESTHESIA N/A 05/10/2013   Procedure: RADIOLOGY WITH ANESTHESIA;  Surgeon: Thyra MARLA Nash, MD;  Location: [facility] OR;  Service: Radiology;  Laterality: N/A;   SHOULDER SURGERY Left    TOTAL ABDOMINAL HYSTERECTOMY     TUBAL  LIGATION     VESICOVAGINAL FISTULA CLOSURE W/ TAH     Family History  Problem Relation Age of Onset   Cancer Father        lung   Cancer Mother        brain   Coronary artery disease Other        family hx   Cancer Other        family hx   Diabetes Other        DM - family hx   Hypertension Other        family hx   Seizures Other        family hx   Cancer Sister  stomach   Leukemia Sister    Colon cancer Neg Hx    Social History   Socioeconomic History   Marital status: Widowed    Spouse name: Not on file   Number of children: Not on file   Years of education: Not on file   Highest education level: GED or equivalent  Occupational History    Employer: DISABLED  Tobacco Use   Smoking status: Former    Current packs/day: 0.00    Types: Cigarettes, E-cigarettes    Quit date: 06/17/2020    Years since quitting: 3.7   Smokeless tobacco: Never  Vaping Use   Vaping status: Never Used  Substance and Sexual Activity   Alcohol use: No   Drug use: No   Sexual activity: Not Currently  Other Topics Concern   Not on file  Social History Narrative   Widow. Does not work. Drinks coffee in AM    Social Drivers of Health   Financial Resource Strain: Low Risk  (03/10/2024)   Overall Financial Resource Strain (CARDIA)    Difficulty of Paying Living Expenses: Not hard at all  Recent Concern: Financial Resource Strain - Medium Risk (01/20/2024)   Overall Financial Resource Strain (CARDIA)    Difficulty of Paying Living Expenses: Somewhat hard  Food Insecurity: No Food Insecurity (03/10/2024)   Hunger Vital Sign    Worried About Running Out of Food in the Last Year: Never true    Ran Out of Food in the Last Year: Never true  Recent Concern: Food Insecurity - Food Insecurity Present (01/20/2024)   Hunger Vital Sign    Worried About Running Out of Food in the Last Year: Never true    Ran Out of Food in the Last Year: Sometimes true  Transportation Needs: No Transportation  Needs (03/10/2024)   PRAPARE - Administrator, Civil Service (Medical): No    Lack of Transportation (Non-Medical): No  Recent Concern: Transportation Needs - Unmet Transportation Needs (01/20/2024)   PRAPARE - Transportation    Lack of Transportation (Medical): Yes    Lack of Transportation (Non-Medical): Yes  Physical Activity: Insufficiently Active (03/10/2024)   Exercise Vital Sign    Days of Exercise per Week: 2 days    Minutes of Exercise per Session: 20 min  Stress: No Stress Concern Present (03/10/2024)   [employer] of Occupational Health - Occupational Stress Questionnaire    Feeling of Stress: Only a little  Social Connections: Moderately Isolated (03/10/2024)   Social Connection and Isolation Panel    Frequency of Communication with Friends and Family: More than three times a week    Frequency of Social Gatherings with Friends and Family: Patient declined    Attends Religious Services: Never    Database administrator or Organizations: Yes    Attends Banker Meetings: Never    Marital Status: Widowed    Tobacco Counseling Counseling given: Not Answered    Clinical Intake:  Pre-visit preparation completed: Yes  Pain : No/denies pain     BMI - recorded: 23.52 Nutritional Status: BMI of 19-24  Normal Nutritional Risks: None Diabetes: No  Lab Results  Component Value Date   HGBA1C 6.1 01/21/2024   HGBA1C 5.6 11/12/2022   HGBA1C 4.8 07/16/2021     How often do you need to have someone help you when you read instructions, pamphlets, or other written materials from your doctor or pharmacy?: 1 - Never  Interpreter Needed?: No  Information entered by ::  [name], CMA   Activities of Daily Living     03/10/2024    3:34 PM  In your present state of health, do you have any difficulty performing the following activities:  Hearing? 0  Vision? 0  Difficulty concentrating or making decisions? 0  Walking or climbing stairs? 1   Comment uses a cane  Dressing or bathing? 0  Doing errands, shopping? 0  Preparing Food and eating ? N  Using the Toilet? N  In the past six months, have you accidently leaked urine? N  Do you have problems with loss of bowel control? N  Managing your Medications? N  Managing your Finances? N  Housekeeping or managing your Housekeeping? N    Patient Care Team: Norleen Lynwood ORN, MD as PCP - General  I have updated your Care Teams any recent Medical Services you may have received from other providers in the past year.     Assessment:   This is a routine wellness examination for [name].  Hearing/Vision screen Hearing Screening - Comments:: Denies hearing difficulties   Vision Screening - Comments:: Wears rx glasses - up to date with routine eye exams with [facility]   Goals Addressed               This Visit's Progress     Patient Stated (pt-stated)        Patient stated she plans to continue monitoring her arthritis (left hip) and take small amt of walks - staying active       Depression Screen     03/10/2024    3:35 PM 01/21/2024    8:46 AM 03/06/2023    4:14 PM 11/12/2022    3:17 PM 04/17/2022    3:35 PM 02/11/2022   11:27 AM 02/11/2022   11:09 AM  PHQ 2/9 Scores  PHQ - 2 Score 0 0 0 0 0 0 1  PHQ- 9 Score 3  0  0      Fall Risk     03/10/2024    3:34 PM 01/21/2024    8:42 AM 03/06/2023    4:12 PM 11/29/2022    9:41 AM 11/12/2022    3:17 PM  Fall Risk   Falls in the past year? 0 1 1  0  Number falls in past yr: 0 1 1 0 0  Comment    no falls   Injury with Fall? 0 0 1  0  Risk for fall due to : No Fall Risks;Impaired balance/gait  History of fall(s);Impaired balance/gait;Orthopedic patient  No Fall Risks  Follow up Falls evaluation completed;Falls prevention discussed  Education provided;Falls prevention discussed;Falls evaluation completed  Falls evaluation completed    MEDICARE RISK AT HOME:  Medicare Risk at Home Any stairs in or  around the home?: No If so, are there any without handrails?: No Home free of loose throw rugs in walkways, pet beds, electrical cords, etc?: Yes Adequate lighting in your home to reduce risk of falls?: Yes Life alert?: No Use of a cane, walker or w/c?: Yes (cane/walker) Grab bars in the bathroom?: Yes Shower chair or bench in shower?: Yes Elevated toilet seat or a handicapped toilet?: No  TIMED UP AND GO:  Was the test performed?  No  Cognitive Function: 6CIT completed        03/10/2024    3:37 PM 03/06/2023    4:14 PM 02/11/2022   11:26 AM 02/11/2022   11:16 AM  6CIT Screen  What  Year? 0 points 0 points 0 points 0 points  What month? 0 points 0 points 0 points 0 points  What time? 0 points 0 points 0 points 0 points  Count back from 20 0 points 0 points 0 points 0 points  Months in reverse 0 points 0 points 2 points 0 points  Repeat phrase 2 points 0 points 2 points 0 points  Total Score 2 points 0 points 4 points 0 points    Immunizations Immunization History  Administered Date(s) Administered   Fluad Quad(high Dose 65+) 03/31/2015, 03/01/2016, 03/21/2018, 03/05/2019   H1N1 07/11/2008   Hpv-Unspecified 03/19/2022   Influenza Inj Mdck Quad Pf 03/22/2017   Influenza Inj Mdck Quad With Preservative 03/21/2018   Influenza Split 02/20/2012, 02/27/2014, 03/19/2022   Influenza Whole 05/21/2007, 04/15/2008, 03/16/2009, 02/21/2010   Influenza,inj,Quad PF,6+ Mos 03/31/2015, 03/01/2016   Influenza,inj,quad, With Preservative 03/21/2018, 03/05/2019   Influenza-Unspecified 02/20/2012, 01/15/2014, 02/27/2014, 03/31/2015, 03/01/2016, 03/21/2018, 03/05/2019, 04/17/2020, 03/15/2021   Moderna Sars-Covid-2 Vaccination 10/05/2019, 11/04/2019, 05/17/2020   Pfizer Covid-19 Vaccine Bivalent Booster 47yrs & up 03/01/2021   Pneumococcal Conjugate-13 03/19/2016   Pneumococcal Polysaccharide-23 05/01/2015, 08/08/2020   Pneumococcal-Unspecified 05/01/2015, 08/08/2020   Respiratory Syncytial  Virus Vaccine,Recomb Aduvanted(Arexvy) 03/19/2022   Td 01/18/2008   Td (Adult), 2 Lf Tetanus Toxid, Preservative Free 01/18/2008   Tdap 11/26/2018   Zoster Recombinant(Shingrix) 10/17/2021, 11/09/2022    Screening Tests Health Maintenance  Topic Date Due   Influenza Vaccine  01/16/2024   Mammogram  03/25/2024   Lung Cancer Screening  02/03/2025   Medicare Annual Wellness (AWV)  03/10/2025   Colonoscopy  05/01/2025   Pneumococcal Vaccine: 50+ Years (3 of 3 - PCV20 or PCV21) 08/08/2025   DTaP/Tdap/Td (3 - Td or Tdap) 11/25/2028   DEXA SCAN  Completed   Hepatitis C Screening  Completed   HIV Screening  Completed   Zoster Vaccines- Shingrix  Completed   Hepatitis B Vaccines 19-59 Average Risk  Aged Out   HPV VACCINES  Aged Out   Meningococcal B Vaccine  Aged Out   COVID-19 Vaccine  Discontinued    Health Maintenance Items Addressed:  Mammogram scheduled for 03/31/2024.  Lung Cancer Screening: Completed w/Dr Alm Dasen of [facility] in 01/2024 (Former Smoker)  Additional Screening:  Vision Screening: Recommended annual ophthalmology exams for early detection of glaucoma and other disorders of the eye. Is the patient up to date with their annual eye exam?  Yes  Who is the provider or what is the name of the office in which the patient attends annual eye exams? [facility]  Dental Screening: Recommended annual dental exams for proper oral hygiene  Community Resource Referral / Chronic Care Management: CRR required this visit?  No   CCM required this visit?  No   Plan:    I have personally reviewed and noted the following in the patient's chart:   Medical and social history Use of alcohol, tobacco or illicit drugs  Current medications and supplements including opioid prescriptions. Patient is currently taking opioid prescriptions. Information provided to patient regarding non-opioid alternatives. Patient advised to discuss non-opioid treatment  plan with their provider. Functional ability and status Nutritional status Physical activity Advanced directives List of other physicians Hospitalizations, surgeries, and ER visits in previous 12 months Vitals Screenings to include cognitive, depression, and falls Referrals and appointments  In addition, I have reviewed and discussed with patient certain preventive protocols, quality metrics, and best practice recommendations. A written personalized care plan for preventive services  as well as general preventive health recommendations were provided to patient.   [name], CMA   03/10/2024   After Visit Summary: (MyChart) Due to this being a telephonic visit, the after visit summary with patients personalized plan was offered to patient via MyChart   Notes: Scheduled a 1-yr Physical w/PCP for 02/2025

## 2024-03-10 NOTE — Patient Instructions (Addendum)
 [name],  Thank you for taking the time for your Medicare Wellness Visit. I appreciate your continued commitment to your health goals. Please review the care plan we discussed, and feel free to reach out if I can assist you further.  Medicare recommends these wellness visits once per year to help you and your care team stay ahead of potential health issues. These visits are designed to focus on prevention, allowing your provider to concentrate on managing your acute and chronic conditions during your regular appointments.  Please note that Annual Wellness Visits do not include a physical exam. Some assessments may be limited, especially if the visit was conducted virtually. If needed, we may recommend a separate in-person follow-up with your provider.  Ongoing Care Seeing your primary care provider every 3 to 6 months helps us  monitor your health and provide consistent, personalized care.  Referrals If a referral was made during today's visit and you haven't received any updates within two weeks, please contact the referred provider directly to check on the status.  Recommended Screenings:  Health Maintenance  Topic Date Due   Flu Shot  01/16/2024   Breast Cancer Screening  03/25/2024   Screening for Lung Cancer  02/03/2025   Medicare Annual Wellness Visit  03/10/2025   Colon Cancer Screening  05/01/2025   Pneumococcal Vaccine for age over 31 (3 of 3 - PCV20 or PCV21) 08/08/2025   DTaP/Tdap/Td vaccine (3 - Td or Tdap) 11/25/2028   DEXA scan (bone density measurement)  Completed   Hepatitis C Screening  Completed   HIV Screening  Completed   Zoster (Shingles) Vaccine  Completed   Hepatitis B Vaccine  Aged Out   HPV Vaccine  Aged Out   Meningitis B Vaccine  Aged Out   COVID-19 Vaccine  Discontinued       03/10/2024    3:28 PM  Advanced Directives  Does Patient Have a Medical Advance Directive? No  Would patient like information on creating a medical advance directive? No -  Patient declined   Advance Care Planning is important because it: Ensures you receive medical care that aligns with your values, goals, and preferences. Provides guidance to your family and loved ones, reducing the emotional burden of decision-making during critical moments.  Vision: Annual vision screenings are recommended for early detection of glaucoma, cataracts, and diabetic retinopathy. These exams can also reveal signs of chronic conditions such as diabetes and high blood pressure.  Dental: Annual dental screenings help detect early signs of oral cancer, gum disease, and other conditions linked to overall health, including heart disease and diabetes.

## 2024-03-18 DIAGNOSIS — M542 Cervicalgia: Secondary | ICD-10-CM | POA: Diagnosis not present

## 2024-03-18 DIAGNOSIS — G894 Chronic pain syndrome: Secondary | ICD-10-CM | POA: Diagnosis not present

## 2024-03-18 DIAGNOSIS — M961 Postlaminectomy syndrome, not elsewhere classified: Secondary | ICD-10-CM | POA: Diagnosis not present

## 2024-03-18 DIAGNOSIS — M4726 Other spondylosis with radiculopathy, lumbar region: Secondary | ICD-10-CM | POA: Diagnosis not present

## 2024-03-18 DIAGNOSIS — Z79891 Long term (current) use of opiate analgesic: Secondary | ICD-10-CM | POA: Diagnosis not present

## 2024-03-18 DIAGNOSIS — M51362 Other intervertebral disc degeneration, lumbar region with discogenic back pain and lower extremity pain: Secondary | ICD-10-CM | POA: Diagnosis not present

## 2024-03-18 DIAGNOSIS — M461 Sacroiliitis, not elsewhere classified: Secondary | ICD-10-CM | POA: Diagnosis not present

## 2024-03-31 ENCOUNTER — Ambulatory Visit

## 2024-04-22 NOTE — Telephone Encounter (Signed)
 Noted.   Copied from CRM #[id number]. Topic: Appointments - Transfer of Care >> Apr 22, 2024 11:28 AM Roselie BROCKS wrote: Pt is requesting to transfer FROM: [facility] Pt is requesting to transfer TO: [facility]  Reason for requested transfer: Darice Brownie  It is the responsibility of the team the patient would like to transfer to (Dr. Darice Brownie ) to reach out to the patient if for any reason this transfer is not acceptable.

## 2024-04-23 ENCOUNTER — Other Ambulatory Visit: Payer: Self-pay | Admitting: Physician Assistant

## 2024-04-24 ENCOUNTER — Ambulatory Visit (INDEPENDENT_AMBULATORY_CARE_PROVIDER_SITE_OTHER)

## 2024-04-24 DIAGNOSIS — Z1231 Encounter for screening mammogram for malignant neoplasm of breast: Secondary | ICD-10-CM | POA: Diagnosis not present

## 2024-04-26 DIAGNOSIS — D649 Anemia, unspecified: Secondary | ICD-10-CM | POA: Insufficient documentation

## 2024-04-26 DIAGNOSIS — K625 Hemorrhage of anus and rectum: Secondary | ICD-10-CM | POA: Insufficient documentation

## 2024-04-27 DIAGNOSIS — K922 Gastrointestinal hemorrhage, unspecified: Secondary | ICD-10-CM | POA: Insufficient documentation

## 2024-04-28 NOTE — Discharge Summary (Signed)
 [facility] Discharge Summary  PCP: Lynwood LELON Rush, MD Discharge Details   Admit date:         04/26/2024 Discharge date:        04/28/2024  Hospital LOS:    1 days DIscharge Disposition:  Home  Active Hospital Problems   Diagnosis Date Noted POA  . *Anemia, unspecified type 04/26/2024 Yes  . GI bleed 04/27/2024 Yes  . Rectal bleeding 04/26/2024 Unknown  . Chronic pain syndrome 05/21/2021 Yes  . COPD (chronic obstructive pulmonary disease) (*) 12/02/2014 Yes  . Bipolar 1 disorder (*) 12/02/2014 Yes  . Hypercholesteremia 12/02/2014 Yes  . Cervical disc disease with myelopathy 12/01/2014 Yes    Resolved Hospital Problems  No resolved problems to display.      Current Discharge Medication List     PAUSE taking these medications as directed      Details  aspirin  325 mg tablet Wait to take this until: May 03, 2024  Take one tablet (325 mg dose) by mouth daily. Pause reason: Other (Comment) Pause comment: Anemia with GI bleed post polypectomy       START taking these medications      Details  ondansetron  4 mg tablet Commonly known as: ZOFRAN   Take one tablet (4 mg dose) by mouth every 4 (four) hours as needed for up to 7 days. Quantity: 20 tablet   polyethylene glycol 17 g packet Commonly known as: MIRALAX  Take 120 mLs (17 g dose) by mouth daily as needed for up to 14 days. Quantity: 14 each       CONTINUE these medications which have CHANGED      Details  QUEtiapine  fumarate 300 mg tablet Commonly known as: SEROQUEL  What changed: Another medication with the same name was removed. Continue taking this medication, and follow the directions you see here.  Take two tablets (600 mg dose) by mouth at bedtime.       CONTINUE these medications which have NOT CHANGED      Details  acetaminophen  650 MG CR tablet Commonly known as: TYLENOL  ARTHRITIS,MAPAP  Take two tablets (1,300 mg dose) by mouth as needed for Pain.    albuterol  sulfate HFA 108 (90 Base) MCG/ACT inhaler Commonly known as: PROVENTIL ,VENTOLIN ,PROAIR   Inhale two puffs into the lungs 4 (four) times daily. Quantity: 6.7 g   atorvastatin  40 mg tablet Commonly known as: LIPITOR  Take one tablet (40 mg dose) by mouth at bedtime.   * buPROPion  hcl 300 mg 24 hr tablet Commonly known as: WELLBUTRIN  XL  Take one tablet (300 mg dose) by mouth every morning. TAKE WITH 150 MG TAB FOR TOTAL DAILY DOSE OF 450 MG EVERY MORNING   * buPROPion  HCl 150 mg 24 hr tablet Commonly known as: WELLBUTRIN  XL  Take one tablet (150 mg dose) by mouth every morning. TAKE WITH 300 MG TAB FOR TOTAL DAILY DOSE OF 450 MG EVERY MORNING   cetirizine  10 mg tablet Commonly known as: ZYRTEC   Take one tablet (10 mg dose) by mouth daily.   diphenhydrAMINE 25 mg tablet Commonly known as: BANOPHEN  Take one tablet (25 mg dose) by mouth at bedtime as needed.   DULCOLAX 5 mg EC tablet Generic drug: bisacodyl  Take one tablet (5 mg dose) by mouth as needed for Constipation (PRN ONLY). HS   FERREX 150 150 MG capsule Generic drug: iron  polysaccharides  Take one capsule (150 mg dose) by mouth daily.   FLONASE ALLERGY RELIEF 50 mcg/actuation nasal  spray Generic drug: fluticasone propionate  two sprays by Both Nostrils route daily as needed for Rhinitis or Allergies (PRN ONLY).   IBSRELA 50 MG Tabs Generic drug: Tenapanor HCl  Take 50 mg by mouth 2 (two) times daily. Quantity: 60 tablet   * lamoTRIgine  100 mg tablet Commonly known as: LAMICTAL   Take three tablets (300 mg dose) by mouth every evening. QHS   * lamoTRIgine  100 mg tablet Commonly known as: LAMICTAL   Take one tablet (100 mg dose) by mouth every morning. QAM 100 MG   metFORMIN  500 mg tablet Commonly known as: GLUCOPHAGE   Take one tablet (500 mg dose) by mouth 2 (two) times daily with meals.   naloxone nasal spray (TAKE HOME PACK) Commonly known as: NARCAN  one spray by Nasal route once as needed.    omeprazole 40 mg capsule Commonly known as: PRILOSEC  Take one capsule (40 mg dose) by mouth daily. Quantity: 30 capsule   oxyCODONE  HCl 10 mg immediate release tablet Commonly known as: ROXICODONE   Take one tablet (10 mg dose) by mouth every 6 (six) hours as needed for Pain.   potassium chloride  10 mEq CR tablet Commonly known as: KLOR-CON   Take one tablet (10 mEq dose) by mouth every morning for 7 days. Quantity: 7 tablet   promethazine  12.5 MG tablet Commonly known as: PHENERGAN   Take one tablet (12.5 mg dose) by mouth every 6 (six) hours as needed for Nausea. Quantity: 60 tablet   sucralfate  1 g tablet Commonly known as: CARAFATE   Take one tablet (1 g dose) by mouth 4 (four) times a day as needed (PRN ONLY).   tizanidine 2 MG tablet Commonly known as: ZANAFLEX  Take one tablet (2 mg dose) by mouth 2 (two) times a day as needed.   vitamin B-12 1000 mcg tablet Commonly known as: CYANOCOBALAMIN   Take one tablet (1,000 mcg dose) by mouth daily.   Vitamin D3 50 MCG (2000 UT) capsule  Take one capsule (2,000 Units dose) by mouth daily.      * * This list has 4 medication(s) that are the same as other medications prescribed for you. Read the directions carefully, and ask your doctor or other care provider to review them with you.         * You might also be taking other medications not listed above. If you have questions about any of your other medications, talk to the person who prescribed them or your Primary Care Provider.          STOP taking these medications    ARIPiprazole  5 mg tablet Commonly known as: ABILIFY    azelastine 0.1 % nasal spray Commonly known as: ASTELIN   diphenhydraMINE-acetaminophen  25-500 MG Tabs Commonly known as: TYLENOL  PM   mirtazapine  15 mg tablet Commonly known as: REMERON    NA sulfate-potassium sulfate-magnesium sulfate 17.5-3.13-1.6 GM/177ML Commonly known as: SUPREP BOWEL PREP KIT   potassium chloride  10 mEq crys ER  tablet Commonly known as: KLOR-CON  M10   PROBIOTIC ADVANCED PO       ASK your doctor about these medications      Details  umeclidinium bromide 62.5 mcg/inh inhalation powder Commonly known as: INCRUSE ELLIPTA  Inhale one puff into the lungs daily.        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  Patient was treated for anemia with iron  deficiency, rectal bleeding post recent colonoscopy and polypectomy.  Pt received 1 U PRBC and IV iron  infusion.   GI was on board.  Patient underwent colonoscopy on 04/27/2024. Findings: Postpolypectomy site noted in the cecum with overlying exudate appreciated.  No bleeding noted.  Three clips were empirically placed over the polypectomy site Large postpolypectomy site noted with overlying exudate appreciated.  No active bleeding was appreciated.  3 clips were placed over the polypectomy site  Pt also had findings of left retroperitoneum lesion ? 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  At the time of discharge, patient is doing good, vitals are stable, home medications have been resumed and reconciled, resumed home dose of omeprazole and PO iron . Home dose of ASA 325 kept on hold for 1 week, can resume afterwards.   Please repeat CBC within 5-7 days of  discharge.  Patient will have to call to schedule an appointment and follow up with PCP within 1 week of discharge.  Please call to schedule an appointment and follow up with GI and oncology within 1-2 weeks of discharge.  Please Call 911 incase of emergency and seek medical advice immediately in case of worsening of symptoms.  Bedside Procedures   No orders found     [facility]   Discharge Procedure Orders  Follow-up with Primary Care Physician  Standing Status: Future  Referral Priority: Routine Referral Type: Consultation  Referral Reason: Evaluate and Return  Number of Visits Requested: 1 Expiration Date: 10/24/24   Ambulatory referral to Gastroenterology  Standing Status: Future  Referral Priority: Routine Referral Type: Consultation  Referral Reason: Evaluate and Return  Referred to Provider: JEFREY BRUCKNER Requested Specialty: Gastroenterology  Number of Visits Requested: 1 Expiration Date: 10/24/24   Ambulatory Referral to Oncology  Standing Status: Future  Referral Priority: Routine Referral Type: Consultation  Referral Reason: Evaluate and Return  Requested Specialty: Oncology  Number of Visits Requested: 1 Expiration Date: 10/24/24   Regular Diet   Activity as tolerated   Discharge instructions  Order Comments: Please repeat Hemoglobin at your PCP office after 5-7 days of discharge.  Follow up with GI , Oncology and PCP. Call 911 in case of emergency, seek medical advice immediately.    Unresulted [employer] Current Status   Prepare RBC, 2 Units Preliminary result           Code Status:   Full Code   Time spent in discharge process:  35 minutes  This note was dictated with voice recognition software. Similar sounding words can inadvertently be transcribed and may not be corrected upon review  Electronically signed: Arlin Minion, MD 04/28/2024 / 2:03 PM  *Some images could not be shown.

## 2024-04-29 ENCOUNTER — Telehealth: Payer: Self-pay

## 2024-04-29 NOTE — Transitions of Care (Post Inpatient/ED Visit) (Signed)
 04/29/2024  Name: [name] MRN: [id number] DOB: 1955/11/17  Today's TOC FU Call Status: Today's TOC FU Call Status:: Successful TOC FU Call Completed TOC FU Call Complete Date: 04/29/24  Patient's Name and Date of Birth confirmed. Name, DOB  Transition Care Management Follow-up Telephone Call Date of Discharge: 04/28/24 Discharge Facility: Other [facility]) Name of Other (Non-[facility]) Discharge Facility: [facility] How have you been since you were released from the hospital?: Better Any questions or concerns?: No  Items Reviewed: Did you receive and understand the discharge instructions provided?: Yes Medications obtained,verified, and reconciled?: Yes (Medications Reviewed) Any new allergies since your discharge?: No Dietary orders reviewed?: Yes Type of Diet Ordered:: patient states she is not aware of a specific diet. Do you have support at home?: Yes People in Home [RPT]: other relative(s) Name of Support/Comfort Primary Source: Erlean Mealor ( daughter in law)  Medications Reviewed Today: Medications Reviewed Today     Reviewed by Lando Alcalde E, RN (Registered Nurse) on 04/29/24 at 1530  Med List Status: <None>   Medication Order Taking? Sig Documenting Provider Last Dose Status Informant  albuterol  (PROVENTIL ) (2.5 MG/3ML) 0.083% nebulizer solution [id number] Yes  [provider]  Active   aspirin  325 MG tablet [id number]  Take 325 mg by mouth daily. [provider]  Active Self  atorvastatin  (LIPITOR) 40 MG tablet [id number] Yes TAKE 1 TABLET BY MOUTH DAILY Norleen Lynwood ORN, MD  Active   bisacodyl (DULCOLAX) 5 MG EC tablet [id number] Yes Take by mouth. [provider]  Active   buPROPion  (WELLBUTRIN  XL) 150 MG 24 hr tablet [id number] Yes TAKE 1 TABLET BY MOUTH DAILY Rhys Verneita DASEN, PA-C  Active   buPROPion  (WELLBUTRIN  XL) 300 MG 24 hr tablet [id number] Yes TAKE 1 TABLET BY MOUTH DAILY Hurst, Teresa T, PA-C   Active   cetirizine  (ZYRTEC ) 10 MG tablet [id number] Yes Take by mouth. [provider]  Active   Cholecalciferol (VITAMIN D ) 50 MCG (2000 UT) tablet [id number]  Take 2,000 Units by mouth daily.  Patient not taking: Reported on 04/29/2024   [provider]  Active Self  diphenhydrAMINE (BENADRYL) 25 MG tablet [id number] Yes Take by mouth.  Patient taking differently: Take 25 mg by mouth at bedtime as needed.   [provider]  Active   diphenhydramine-acetaminophen  (TYLENOL  PM) 25-500 MG TABS tablet [id number] Yes Take 1 tablet by mouth at bedtime as needed. [provider]  Active   FLONASE ALLERGY RELIEF 50 MCG/ACT nasal spray [id number] Yes  [provider]  Active   FLUCELVAX QUADRIVALENT SUSP injection [id number]    Patient not taking: Reported on 04/29/2024   [provider]  Active   ibuprofen (ADVIL) 200 MG tablet [id number]  Take by mouth.  Patient not taking: Reported on 04/29/2024   [provider]  Active   iron  polysaccharides (NU-IRON ) 150 MG capsule [id number] Yes Take 1 capsule (150 mg total) by mouth daily. Norleen Lynwood ORN, MD  Active   lamoTRIgine  (LAMICTAL ) 100 MG tablet [id number] Yes TAKE 1 TABLET BY MOUTH IN THE  MORNING AND 3 TABLETS BY MOUTH  AT BEDTIME Rhys Verneita T, PA-C  Active   metFORMIN  (GLUCOPHAGE ) 500 MG tablet [id number] Yes Take 1 tablet by mouth twice daily with food Rhys, Verneita DASEN, PA-C  Active   naloxone (NARCAN) 4 MG/0.1ML LIQD nasal spray kit [id number] Yes Place 0.4 mg into the nose as needed (opioid overdose). [provider]  Active Self  omeprazole (PRILOSEC) 40 MG capsule [id number] Yes Take 40 mg by mouth 2 (two) times daily. [provider]  Active   Oxycodone  HCl 10 MG TABS [id number] Yes Take 1 tablet by mouth every 6 (six) hours as needed (pain).  [provider]  Active Self  PFIZER COVID-19 [facility] BIVALENT injection [id number]    Patient not taking: Reported on 01/21/2024    [provider]  Active   potassium chloride  (KLOR-CON  M) 10 MEQ tablet [id number] Yes TAKE 1 TABLET BY MOUTH DAILY Norleen Lynwood ORN, MD  Active   Probiotic Product (PROBIOTIC DAILY PO) [id number]  Take by mouth.  Patient not taking: Reported on 04/29/2024   [provider]  Active   promethazine  (PHENERGAN ) 12.5 MG tablet [id number] Yes Take 1 tablet by mouth  every 6 hours as needed for nausea Norleen Lynwood ORN, MD  Active   QUEtiapine  (SEROQUEL ) 300 MG tablet [id number] Yes Take 2 tablets (600 mg total) by mouth at bedtime. Rhys Boyer T, PA-C  Active   sucralfate  (CARAFATE ) 1 g tablet [id number] Yes TAKE ONE TABLET BY MOUTH FOUR TIMES DAILY  Patient taking differently: Take 1 g by mouth 4 (four) times daily. Patient states she takes as needed.   Norleen Lynwood ORN, MD  Active   tiZANidine (ZANAFLEX) 2 MG tablet [id number] Yes Take 2 mg by mouth 2 (two) times daily as needed. [provider]  Active   vitamin B-12 (CYANOCOBALAMIN ) 1000 MCG tablet [id number] Yes Take 1 tablet (1,000 mcg total) by mouth daily. Norleen Lynwood ORN, MD  Active             Home Care and Equipment/Supplies: Were Home Health Services Ordered?: No Any new equipment or medical supplies ordered?: No  Functional Questionnaire: Do you need assistance with bathing/showering or dressing?: No Do you need assistance with meal preparation?: No Do you need assistance with eating?: No Do you have difficulty maintaining continence: No Do you need assistance with getting out of bed/getting out of a chair/moving?: No Do you have difficulty managing or taking your medications?: No  Follow up appointments reviewed: PCP Follow-up appointment confirmed?: Yes Date of PCP follow-up appointment?: 05/04/24 Follow-up Provider: Lynwood Norleen, MD Specialist Hospital Follow-up appointment confirmed?: Yes Date of Specialist follow-up appointment?: 06/03/24 (05/2024) Follow-Up Specialty Provider:: Dr. Lonni Bowers Do you  need transportation to your follow-up appointment?: No Do you understand care options if your condition(s) worsen?: Yes-patient verbalized understanding  SDOH Interventions Today    Flowsheet Row Most Recent Value  SDOH Interventions   Food Insecurity Interventions Intervention Not Indicated  Housing Interventions Intervention Not Indicated  Transportation Interventions Intervention Not Indicated  Utilities Interventions Intervention Not Indicated   Discussed and offered 30 day TOC program.  Patient declined.  The patient has been provided with contact information for the care management team and has been advised to call with any health -related questions or concerns.  The patient verbalized understanding with current plan of care.  The patient is directed to their insurance card regarding availability of benefits coverage.    Arvin Seip RN, BSN, CCM [employer], Population Health Case Manager Phone: [phone]

## 2024-04-29 NOTE — Patient Instructions (Signed)
 Visit Information  Thank you for taking time to visit with me today. Please don't hesitate to contact me if I can be of assistance to you.  Patient instructions:  Notify provider of re-bleeding ( black / tarry stools, vomiting blood, dizziness) symptoms seek emergency medical services for these symptoms.  take medications as prescribed,  eat small frequent low irritant meals,   follow up with provider as recommended.    Patient verbalizes understanding of instructions and care plan provided today and agrees to view in MyChart. Active MyChart status and patient understanding of how to access instructions and care plan via MyChart confirmed with patient.     The patient has been provided with contact information for the care management team and has been advised to call with any health related questions or concerns.   Please call the care guide team at [phone] if you need to cancel or reschedule your appointment.   Please call the Suicide and Crisis Lifeline: 988 call the [place]  National Suicide Prevention Lifeline: (414)391-5941 or TTY: 919 202 8813 TTY 331-524-4287) to talk to a trained counselor call 1-800-273-TALK (toll free, 24 hour hotline) if you are experiencing a Mental Health or Behavioral Health Crisis or need someone to talk to.  Arvin Seip RN, BSN, CCM [employer], Population Health Case Manager Phone: [phone]

## 2024-05-03 ENCOUNTER — Encounter: Payer: Self-pay | Admitting: Physician Assistant

## 2024-05-03 ENCOUNTER — Ambulatory Visit (INDEPENDENT_AMBULATORY_CARE_PROVIDER_SITE_OTHER): Admitting: Physician Assistant

## 2024-05-03 DIAGNOSIS — F259 Schizoaffective disorder, unspecified: Secondary | ICD-10-CM | POA: Diagnosis not present

## 2024-05-03 DIAGNOSIS — F411 Generalized anxiety disorder: Secondary | ICD-10-CM | POA: Diagnosis not present

## 2024-05-03 DIAGNOSIS — F5105 Insomnia due to other mental disorder: Secondary | ICD-10-CM

## 2024-05-03 DIAGNOSIS — F99 Mental disorder, not otherwise specified: Secondary | ICD-10-CM

## 2024-05-03 NOTE — Progress Notes (Signed)
 [facility]  Patient ID: [name],  MRN: [id number]  PCP: Norleen Lynwood ORN, MD  Date of Evaluation: 05/03/2024 Time spent:20 minutes  Chief Complaint:  Chief Complaint   Depression; Follow-up    HISTORY/CURRENT STATUS: HPI  For routine 6 month med check.  She's overwhelmed with physical health issues.  Has appt w/ PCP, GI, and Oncology in the next few weeks.  States she's not worried about it.  I'll wait to see what the bx shows and then deal with it.  States she does not work about her head and she has 3 stents in her brain and that turned out okay.  Feels like her mental health is stable.  Medications are working as intended.  Energy is low due to the anemia.  She does what she has to do around the house though.  ADLs and personal hygiene are normal.  Appetite is normal and weight is stable.  No extreme sadness.  No feelings of hopelessness.  No panic attacks.  Sleeps well.  No mania, delirium, or psychosis.  No suicidal or homicidal thoughts.  Individual Medical History/ Review of Systems: Changes? :Yes   was admitted last week, bled after colonscopy, was already anemic, had transfusion. Another colonoscopy was done, clips were placed at polypectomy site.  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 Work-up pending.   Past medications for mental health diagnoses include: Ingrezza was ineffective, Lamictal , Risperdal , Zyprexa , Cogentin , Latuda , Cymbalta , lithium , Ambien , prazosin , Ativan, Wellbutrin , Zoloft, and probably others, trazodone  possibly caused double vision and a fall. Seroquel , Gabapentin  didn't help anxiety, Mirtazepine caused dizziness  Allergies: Ambien  [zolpidem  tartrate]; Anesthetics, halogenated; Hydrocodone ; Lactose intolerance (gi); Nsaids; Propoxyphene n-acetaminophen ; Tramadol; and Chantix  [varenicline ]  Current Medications:  Current Outpatient Medications:    albuterol  (PROVENTIL ) (2.5 MG/3ML) 0.083% nebulizer solution, , Disp: , Rfl:     atorvastatin  (LIPITOR) 40 MG tablet, TAKE 1 TABLET BY MOUTH DAILY, Disp: 100 tablet, Rfl: 2   bisacodyl (DULCOLAX) 5 MG EC tablet, Take by mouth., Disp: , Rfl:    buPROPion  (WELLBUTRIN  XL) 150 MG 24 hr tablet, TAKE 1 TABLET BY MOUTH DAILY, Disp: 90 tablet, Rfl: 1   buPROPion  (WELLBUTRIN  XL) 300 MG 24 hr tablet, TAKE 1 TABLET BY MOUTH DAILY, Disp: 90 tablet, Rfl: 1   cetirizine  (ZYRTEC ) 10 MG tablet, Take by mouth., Disp: , Rfl:    diphenhydramine-acetaminophen  (TYLENOL  PM) 25-500 MG TABS tablet, Take 1 tablet by mouth at bedtime as needed., Disp: , Rfl:    FLONASE ALLERGY RELIEF 50 MCG/ACT nasal spray, , Disp: , Rfl:    iron  polysaccharides (NU-IRON ) 150 MG capsule, Take 1 capsule (150 mg total) by mouth daily., Disp: 90 capsule, Rfl: 1   lamoTRIgine  (LAMICTAL ) 100 MG tablet, TAKE 1 TABLET BY MOUTH IN THE  MORNING AND 3 TABLETS BY MOUTH  AT BEDTIME, Disp: 400 tablet, Rfl: 1   metFORMIN  (GLUCOPHAGE ) 500 MG tablet, Take 1 tablet by mouth twice daily with food, Disp: 180 tablet, Rfl: 0   naloxone (NARCAN) 4 MG/0.1ML LIQD nasal spray kit, Place 0.4 mg into the nose as needed (opioid overdose)., Disp: , Rfl:    omeprazole (PRILOSEC) 40 MG capsule, Take 40 mg by mouth 2 (two) times daily., Disp: , Rfl:    Oxycodone  HCl 10 MG TABS, Take 1 tablet by mouth every 6 (six) hours as needed (pain). , Disp: , Rfl:    potassium chloride  (KLOR-CON  M) 10 MEQ tablet, TAKE 1 TABLET BY MOUTH DAILY, Disp: 60 tablet, Rfl:  5   promethazine  (PHENERGAN ) 12.5 MG tablet, Take 1 tablet by mouth  every 6 hours as needed for nausea, Disp: 90 tablet, Rfl: 0   QUEtiapine  (SEROQUEL ) 300 MG tablet, Take 2 tablets (600 mg total) by mouth at bedtime., Disp: 180 tablet, Rfl: 3   tiZANidine (ZANAFLEX) 2 MG tablet, Take 2 mg by mouth 2 (two) times daily as needed., Disp: , Rfl:    vitamin B-12 (CYANOCOBALAMIN ) 1000 MCG tablet, Take 1 tablet (1,000 mcg total) by mouth daily., Disp: 90 tablet, Rfl: 3   aspirin  325 MG tablet, Take 325 mg by  mouth daily. (Patient not taking: Reported on 04/29/2024), Disp: , Rfl:    Cholecalciferol (VITAMIN D ) 50 MCG (2000 UT) tablet, Take 2,000 Units by mouth daily. (Patient not taking: Reported on 04/29/2024), Disp: , Rfl:    diphenhydrAMINE (BENADRYL) 25 MG tablet, Take by mouth. (Patient taking differently: Take 25 mg by mouth at bedtime as needed.), Disp: , Rfl:    FLUCELVAX QUADRIVALENT SUSP injection, , Disp: , Rfl:    ibuprofen (ADVIL) 200 MG tablet, Take by mouth. (Patient not taking: Reported on 04/29/2024), Disp: , Rfl:    PFIZER COVID-19 VAC BIVALENT injection, , Disp: , Rfl:    Probiotic Product (PROBIOTIC DAILY PO), Take by mouth. (Patient not taking: Reported on 04/29/2024), Disp: , Rfl:    sucralfate  (CARAFATE ) 1 g tablet, TAKE ONE TABLET BY MOUTH FOUR TIMES DAILY (Patient taking differently: Take 1 g by mouth 4 (four) times daily. Patient states she takes as needed.), Disp: 120 tablet, Rfl: 0 Medication Side Effects: weight gain  Family Medical/ Social History: Changes?   No  MENTAL HEALTH EXAM:  There were no vitals taken for this visit.There is no height or weight on file to calculate BMI.  General Appearance: Casual and Well Groomed  Eye Contact:  Good  Speech:  Clear and Coherent and Normal Rate  Volume:  Normal  Mood:  Euthymic  Affect:  Congruent  Thought Process:  Goal Directed and Descriptions of Associations: Circumstantial  Orientation:  Full (Time, Place, and Person)  Thought Content: Logical   Suicidal Thoughts:  No  Homicidal Thoughts:  No  Memory:  WNL  Judgement:  Good  Insight:  Good  Psychomotor Activity:  Walks slowly with a cane  Concentration:  Concentration: Good  Recall:  Good  Fund of Knowledge: Good  Language: Good  Assets:  Communication Skills Desire for Improvement Financial Resources/Insurance Housing Resilience  ADL's:  Intact  Cognition: WNL  Prognosis:  Good   Discharge summary and labs were reviewed from earlier this  month.  DIAGNOSES:    ICD-10-CM   1. Schizoaffective disorder, unspecified type (HCC)  F25.9     2. Generalized anxiety disorder  F41.1     3. Insomnia due to other mental disorder  F51.05    F99       Receiving Psychotherapy: No   RECOMMENDATIONS:  PDMP reviewed.  Oxycodone  known to me.   I provided approximately 20 minutes of face to face time during this encounter, including time spent before and after the visit in records review, medical decision making, counseling pertinent to today's visit, and charting.   From a mental health medication standpoint she is doing well so no changes need to be made.  Continue Wellbutrin  XL 150 mg +300 mg. Continue Lamictal  100 mg, 1 p.o. every morning and 3 p.o. nightly. Continue metformin  500 mg, 1 p.o. twice daily.  Take with food. Continue Seroquel  IR 300  mg, 2 p.o. nightly. Continue vitamins as per med list. Return in 6 months.  Verneita Cooks, PA-C

## 2024-05-04 ENCOUNTER — Ambulatory Visit: Payer: Self-pay | Admitting: Internal Medicine

## 2024-05-04 ENCOUNTER — Telehealth: Payer: Self-pay

## 2024-05-04 ENCOUNTER — Encounter: Payer: Self-pay | Admitting: Internal Medicine

## 2024-05-04 ENCOUNTER — Ambulatory Visit (INDEPENDENT_AMBULATORY_CARE_PROVIDER_SITE_OTHER): Admitting: Internal Medicine

## 2024-05-04 VITALS — BP 120/78 | HR 85 | Temp 98.6°F | Ht 64.0 in | Wt 143.0 lb

## 2024-05-04 DIAGNOSIS — R1900 Intra-abdominal and pelvic swelling, mass and lump, unspecified site: Secondary | ICD-10-CM

## 2024-05-04 DIAGNOSIS — D509 Iron deficiency anemia, unspecified: Secondary | ICD-10-CM

## 2024-05-04 DIAGNOSIS — I671 Cerebral aneurysm, nonruptured: Secondary | ICD-10-CM

## 2024-05-04 LAB — CBC WITH DIFFERENTIAL/PLATELET
Basophils Absolute: 0 K/uL (ref 0.0–0.1)
Basophils Relative: 0.5 % (ref 0.0–3.0)
Eosinophils Absolute: 0.3 K/uL (ref 0.0–0.7)
Eosinophils Relative: 4.5 % (ref 0.0–5.0)
HCT: 24.1 % — ABNORMAL LOW (ref 36.0–46.0)
Hemoglobin: 7.7 g/dL — CL (ref 12.0–15.0)
Lymphocytes Relative: 12.4 % (ref 12.0–46.0)
Lymphs Abs: 0.8 K/uL (ref 0.7–4.0)
MCHC: 32 g/dL (ref 30.0–36.0)
MCV: 90.2 fl (ref 78.0–100.0)
Monocytes Absolute: 0.5 K/uL (ref 0.1–1.0)
Monocytes Relative: 7.2 % (ref 3.0–12.0)
Neutro Abs: 4.7 K/uL (ref 1.4–7.7)
Neutrophils Relative %: 75.4 % (ref 43.0–77.0)
Platelets: 478 K/uL — ABNORMAL HIGH (ref 150.0–400.0)
RBC: 2.67 Mil/uL — ABNORMAL LOW (ref 3.87–5.11)
RDW: 18.3 % — ABNORMAL HIGH (ref 11.5–15.5)
WBC: 6.3 K/uL (ref 4.0–10.5)

## 2024-05-04 LAB — BASIC METABOLIC PANEL WITH GFR
BUN: 16 mg/dL (ref 6–23)
CO2: 30 meq/L (ref 19–32)
Calcium: 8.7 mg/dL (ref 8.4–10.5)
Chloride: 101 meq/L (ref 96–112)
Creatinine, Ser: 0.83 mg/dL (ref 0.40–1.20)
GFR: 73.62 mL/min (ref >60.00)
Glucose, Bld: 100 mg/dL — ABNORMAL HIGH (ref 70–99)
Potassium: 5 meq/L (ref 3.5–5.1)
Sodium: 135 meq/L (ref 135–145)

## 2024-05-04 LAB — HEPATIC FUNCTION PANEL
ALT: 10 U/L (ref 0–35)
AST: 12 U/L (ref 0–37)
Albumin: 3.7 g/dL (ref 3.5–5.2)
Alkaline Phosphatase: 76 U/L (ref 39–117)
Bilirubin, Direct: 0 mg/dL (ref 0.0–0.3)
Total Bilirubin: 0.2 mg/dL (ref 0.2–1.2)
Total Protein: 5.9 g/dL — ABNORMAL LOW (ref 6.0–8.3)

## 2024-05-04 NOTE — Assessment & Plan Note (Signed)
 Pt due f/u - for CT angio head and neck w wo, and refer Neurosurguryt

## 2024-05-04 NOTE — Telephone Encounter (Signed)
 This is stable to somewhat improved Hgb after recent hospn with severe anemia s/p 1 u prbc.  Pt has GI f/u soon, and this will very likely be repeated.   Thanks

## 2024-05-04 NOTE — Progress Notes (Signed)
 Patient ID: [name], female   DOB: [birth year], 68 y.o.   MRN: [id number]        Chief Complaint: follow up hospn [facility] nov 10 - 11 2025       HPI:  [name] is a 68 y.o. 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  Patient was treated for anemia with iron  deficiency, rectal bleeding post recent colonoscopy and polypectomy.  Pt received 1 U PRBC and IV iron  infusion.  Seen per GI, had colonoscopy nov 11, no active bleeding.  Pt also had findings of left retroperitoneum lesion ? Neoplasm found on the CT, concerning of a slow-growing neoplasm which will need out patient follow up with GI and Oncology referral has been made at time of discharge,  Pt has GI f/u planned dec 10, and oncology next Tuesday.  Pt also mentions she is no longer being followed per Interventional Radiology retired for brain aneurysm and is due for follow up.  No overt bleeding post d/c.  Pt denies chest pain, increased sob or doe, wheezing, orthopnea, PND, increased LE swelling, palpitations, dizziness or syncope.    Due for lab f/u today       Wt Readings from Last 3 Encounters:  05/04/24 143 lb (64.9 kg)  03/10/24 137 lb (62.1 kg)  01/21/24 137 lb 3.2 oz (62.2 kg)   BP Readings from Last 3 Encounters:  05/04/24 120/78  01/21/24 134/82  06/17/23 123/84         Past Medical History:  Diagnosis Date   Allergic rhinitis    Arthritis    Brain aneurysm    Chronic abdominal pain    COPD (chronic obstructive pulmonary disease) (HCC)    denies   Depression    bipolar   Fibromyalgia    Gastroparesis    GERD (gastroesophageal reflux disease)    H/O hiatal hernia    History of colonic polyps    HLD (hyperlipidemia)    IBS (irritable bowel syndrome)    chronic constipation   Migraines    OCD (obsessive  compulsive disorder)    anxiety   Peripheral neuropathy    Past Surgical History:  Procedure Laterality Date   APPENDECTOMY     CERVICAL DISC SURGERY     cholecystectomy     coil to brain aneurysm     COLONOSCOPY     ELBOW SURGERY Bilateral    tendonitis   IR ANGIO INTRA EXTRACRAN SEL COM CAROTID INNOMINATE BILAT MOD SED  07/31/2017   IR ANGIO INTRA EXTRACRAN SEL COM CAROTID INNOMINATE BILAT MOD SED  12/30/2019   IR ANGIO VERTEBRAL SEL VERTEBRAL BILAT MOD SED  07/31/2017   IR ANGIO VERTEBRAL SEL VERTEBRAL BILAT MOD SED  12/30/2019   NECK SURGERY     OOPHORECTOMY     RADIOLOGY WITH ANESTHESIA N/A 05/10/2013   Procedure: RADIOLOGY WITH ANESTHESIA;  Surgeon: Thyra MARLA Nash, MD;  Location: [facility] OR;  Service: Radiology;  Laterality: N/A;   SHOULDER SURGERY Left    TOTAL ABDOMINAL HYSTERECTOMY     TUBAL LIGATION     VESICOVAGINAL FISTULA CLOSURE W/ TAH      reports that she quit smoking about 3 years ago. Her smoking use included cigarettes and e-cigarettes. She has never used smokeless tobacco. She reports that she does not  drink alcohol and does not use drugs. family history includes Cancer in her father, mother, sister, and another family member; Coronary artery disease in an other family member; Diabetes in an other family member; Hypertension in an other family member; Leukemia in her sister; Seizures in an other family member. Allergies  Allergen Reactions   Ambien  [Zolpidem  Tartrate] Other (See Comments)    Memory issues   Anesthetics, Halogenated Nausea Only   Hydrocodone  Nausea Only    Can take it if its in a cough syrup    Lactose Intolerance (Gi)     Bloating, upset stomach    Nsaids     GI irritation, pt tolerates meloxicam  and diclofenac    Propoxyphene N-Acetaminophen  Itching   Tramadol Nausea And Vomiting   Chantix  [Varenicline ] Other (See Comments)    Makes bipolar worse agitation, depression, sick to stomach   Current Outpatient Medications on File Prior to  Visit  Medication Sig Dispense Refill   ondansetron  (ZOFRAN ) 4 MG tablet Take 4 mg by mouth.     polyethylene glycol (MIRALAX / GLYCOLAX) 17 g packet Take 17 g by mouth.     potassium chloride  (KLOR-CON ) 10 MEQ tablet Take 10 mEq by mouth.     acetaminophen  (TYLENOL ) 650 MG CR tablet Take 1,300 mg by mouth.     albuterol  (PROVENTIL ) (2.5 MG/3ML) 0.083% nebulizer solution      aspirin  325 MG tablet Take 325 mg by mouth daily. (Patient not taking: Reported on 04/29/2024)     atorvastatin  (LIPITOR) 40 MG tablet TAKE 1 TABLET BY MOUTH DAILY 100 tablet 2   bisacodyl (DULCOLAX) 5 MG EC tablet Take by mouth.     buPROPion  (WELLBUTRIN  XL) 150 MG 24 hr tablet TAKE 1 TABLET BY MOUTH DAILY 90 tablet 1   buPROPion  (WELLBUTRIN  XL) 300 MG 24 hr tablet TAKE 1 TABLET BY MOUTH DAILY 90 tablet 1   cetirizine  (ZYRTEC ) 10 MG tablet Take by mouth.     Cholecalciferol (VITAMIN D ) 50 MCG (2000 UT) tablet Take 2,000 Units by mouth daily. (Patient not taking: Reported on 04/29/2024)     diphenhydrAMINE (BENADRYL) 25 MG tablet Take by mouth. (Patient taking differently: Take 25 mg by mouth at bedtime as needed.)     diphenhydramine-acetaminophen  (TYLENOL  PM) 25-500 MG TABS tablet Take 1 tablet by mouth at bedtime as needed.     FLONASE ALLERGY RELIEF 50 MCG/ACT nasal spray      FLUCELVAX QUADRIVALENT SUSP injection  (Patient not taking: Reported on 04/29/2024)     ibuprofen (ADVIL) 200 MG tablet Take by mouth. (Patient not taking: Reported on 04/29/2024)     iron  polysaccharides (NU-IRON ) 150 MG capsule Take 1 capsule (150 mg total) by mouth daily. 90 capsule 1   lamoTRIgine  (LAMICTAL ) 100 MG tablet TAKE 1 TABLET BY MOUTH IN THE  MORNING AND 3 TABLETS BY MOUTH  AT BEDTIME 400 tablet 1   metFORMIN  (GLUCOPHAGE ) 500 MG tablet Take 1 tablet by mouth twice daily with food 180 tablet 0   naloxone (NARCAN) 4 MG/0.1ML LIQD nasal spray kit Place 0.4 mg into the nose as needed (opioid overdose).     omeprazole (PRILOSEC) 40 MG  capsule Take 40 mg by mouth 2 (two) times daily.     Oxycodone  HCl 10 MG TABS Take 1 tablet by mouth every 6 (six) hours as needed (pain).      PFIZER COVID-19 VAC BIVALENT injection  (Patient not taking: Reported on 01/21/2024)     potassium chloride  (KLOR-CON  M)  10 MEQ tablet TAKE 1 TABLET BY MOUTH DAILY 60 tablet 5   Probiotic Product (PROBIOTIC DAILY PO) Take by mouth. (Patient not taking: Reported on 04/29/2024)     promethazine  (PHENERGAN ) 12.5 MG tablet Take 1 tablet by mouth  every 6 hours as needed for nausea 90 tablet 0   QUEtiapine  (SEROQUEL ) 300 MG tablet Take 2 tablets (600 mg total) by mouth at bedtime. 180 tablet 3   sucralfate  (CARAFATE ) 1 g tablet TAKE ONE TABLET BY MOUTH FOUR TIMES DAILY (Patient taking differently: Take 1 g by mouth 4 (four) times daily. Patient states she takes as needed.) 120 tablet 0   tiZANidine (ZANAFLEX) 2 MG tablet Take 2 mg by mouth 2 (two) times daily as needed.     vitamin B-12 (CYANOCOBALAMIN ) 1000 MCG tablet Take 1 tablet (1,000 mcg total) by mouth daily. 90 tablet 3   No current facility-administered medications on file prior to visit.        ROS:  All others reviewed and negative.  Objective        PE:  BP 120/78 (BP Location: Right Arm, Patient Position: Sitting, Cuff Size: Normal)   Pulse 85   Temp 98.6 F (37 C) (Oral)   Ht 5' 4 (1.626 m)   Wt 143 lb (64.9 kg)   SpO2 99%   BMI 24.55 kg/m                 Constitutional: Pt appears in NAD               HENT: Head: NCAT.                Right Ear: External ear normal.                 Left Ear: External ear normal.                Eyes: . Pupils are equal, round, and reactive to light. Conjunctivae and EOM are normal               Nose: without d/c or deformity               Neck: Neck supple. Gross normal ROM               Cardiovascular: Normal rate and regular rhythm.                 Pulmonary/Chest: Effort normal and breath sounds without rales or wheezing.                Abd:   Soft, NT, ND, + BS, no organomegaly               Neurological: Pt is alert. At baseline orientation, motor grossly intact               Skin: Skin is warm. No rashes, no other new lesions, LE edema - none               Psychiatric: Pt behavior is normal without agitation   Micro: none  Cardiac tracings I have personally interpreted today:  none  Pertinent Radiological findings (summarize): none   Lab Results  Component Value Date   WBC 6.3 05/04/2024   HGB 7.7 Repeated and verified X2. (LL) 05/04/2024   HCT 24.1 (L) 05/04/2024   PLT 478.0 (H) 05/04/2024   GLUCOSE 100 (H) 05/04/2024   CHOL 138 01/21/2024   TRIG 66.0 01/21/2024   HDL 72.80 01/21/2024  LDLDIRECT 102.7 08/21/2011   LDLCALC 52 01/21/2024   ALT 10 05/04/2024   AST 12 05/04/2024   NA 135 05/04/2024   K 5.0 05/04/2024   CL 101 05/04/2024   CREATININE 0.83 05/04/2024   BUN 16 05/04/2024   CO2 30 05/04/2024   TSH 2.60 01/21/2024   INR 0.9 12/30/2019   HGBA1C 6.1 01/21/2024   Assessment/Plan:  [name] is a 68 y.o. White or Caucasian [1] female with  has a past medical history of Allergic rhinitis, Arthritis, Brain aneurysm, Chronic abdominal pain, COPD (chronic obstructive pulmonary disease) (HCC), Depression, Fibromyalgia, Gastroparesis, GERD (gastroesophageal reflux disease), H/O hiatal hernia, History of colonic polyps, HLD (hyperlipidemia), IBS (irritable bowel syndrome), Migraines, OCD (obsessive compulsive disorder), and Peripheral neuropathy.  Brain aneurysm Pt due f/u - for CT angio head and neck w wo, and refer Neurosurguryt  Anemia No recent overt bleeding now for f/u cbc, and f/u GI dec 10 as planned  Retroperitoneal mass Etiology unclear but can't r/o malignancy even slow growing - for oncology f/u next wk as planned  Followup: Return in about 4 months (around 09/01/2024).  Lynwood Rush, MD 05/04/2024 8:20 PM [facility]

## 2024-05-04 NOTE — Patient Instructions (Signed)
 Please continue all other medications as before, and refills have been done if requested.  Please have the pharmacy call with any other refills you may need.  Please continue your efforts at being more active, low cholesterol diet, and weight control.  Please keep your appointments with your specialists as you may have planned  You will be contacted regarding the referral for: CT angio head and neck (the test done before per Dr D), and Neurosurgury  Please go to the LAB at the blood drawing area for the tests to be done  You will be contacted by phone if any changes need to be made immediately.  Otherwise, you will receive a letter about your results with an explanation, but please check with MyChart first.  Please make an Appointment to return in 4 months, or sooner if needed,

## 2024-05-04 NOTE — Assessment & Plan Note (Signed)
 Etiology unclear but can't r/o malignancy even slow growing - for oncology f/u next wk as planned

## 2024-05-04 NOTE — Assessment & Plan Note (Addendum)
 No recent overt bleeding now for f/u cbc, and f/u GI dec 10 as planned

## 2024-05-09 NOTE — Progress Notes (Signed)
 Pharmacy Quality Measure Review  This patient is appearing on a report for being at risk of failing the adherence measure for diabetes medications this calendar year.   Medication: metformin  500 mg BID Last fill date: 04/29/2024 for 90 day supply  Insurance report was not up to date. No action needed at this time. Prescription out of refills and no upcoming appointment with PCP. Will collaborate with embedded pharmacist to facilitate a new prescription prior to running out of medication.   Woodie Jock, PharmD PGY1 Pharmacy Resident  05/09/2024

## 2024-05-10 ENCOUNTER — Other Ambulatory Visit: Payer: Self-pay | Admitting: Pharmacist

## 2024-05-10 MED ORDER — METFORMIN HCL 500 MG PO TABS: 500.0000 mg | ORAL_TABLET | Freq: Two times a day (BID) | ORAL | 1 refills | Status: AC

## 2024-05-10 NOTE — Progress Notes (Signed)
 Last PCP appt 05/04/24. Will send metformin  refill.  Darrelyn Drum, PharmD, BCPS, CPP Clinical Pharmacist Practitioner [facility] [phone]

## 2024-05-11 ENCOUNTER — Encounter: Admitting: Family Medicine

## 2024-05-11 ENCOUNTER — Ambulatory Visit: Admitting: Family Medicine

## 2024-05-11 ENCOUNTER — Encounter: Payer: Self-pay | Admitting: *Deleted

## 2024-05-11 NOTE — Progress Notes (Signed)
 [name]                                          MRN: [id number]   05/11/2024   The [facility] Quality Team Specialist reviewed this patient medical record for the purposes of chart review for care gap closure. The following were reviewed: chart review for care gap closure-kidney health evaluation for diabetes:eGFR  and uACR.    [facility] Quality Team

## 2024-05-19 ENCOUNTER — Ambulatory Visit: Admitting: Neuroradiology

## 2024-05-26 ENCOUNTER — Ambulatory Visit: Admitting: Family Medicine

## 2024-05-26 ENCOUNTER — Encounter: Payer: Self-pay | Admitting: Family Medicine

## 2024-05-26 ENCOUNTER — Other Ambulatory Visit: Payer: Self-pay

## 2024-05-26 VITALS — BP 110/72 | HR 94 | Ht 64.0 in | Wt 135.0 lb

## 2024-05-26 DIAGNOSIS — M25552 Pain in left hip: Secondary | ICD-10-CM

## 2024-05-26 NOTE — Patient Instructions (Addendum)
 Thank you for coming in today.   Send us  a message after you get the results from the biopsy back, can consider injection based on results of the biopsy.

## 2024-05-26 NOTE — Progress Notes (Signed)
 I, Leotis Batter, CMA acting as a scribe for Artist Lloyd, MD.  [name] is a 68 y.o. female who presents to [facility] today for L hip pain. Pt was previously seen by Dr. Lloyd in 2023 for lumbar radiculitis and R hip pain.  Today, pt reports exacerbation of lft hip sx over the past 8-9 months. Has been seen by Ortho Srug, was told sx are related to arthritis. Sx worse with left side-lying. Has a mass on L side lower back, had PET scan yesterday, scheduling for biopsy. Hip pain is deep in the joint. Notes cramping in the legs when taking iron  supplement. Notes radiating pain into the L LE, can be excruciating at times. Denies n/t/w in the legs, except after taking Iron . Denies catching in the hip joint.   Dx testing: 03/10/24 DEXA  08/12/21 R hip MRI  Pertinent review of systems: No fevers or chills  Relevant historical information: Large left retroperitoneal mass   Exam:  BP 110/72   Pulse 94   Ht 5' 4 (1.626 m)   Wt 135 lb (61.2 kg)   SpO2 99%   BMI 23.17 kg/m  General: Well Developed, well nourished, and in no acute distress.   MSK: Left hip normal-appearing Tender palpation greater trochanter.  Hip abduction strength is diminished mildly painful to resisted strength testing.    Lab and Radiology Results  Fleeta Bang, Adine GORMAN, MD - 04/26/2024  Formatting of this note might be different from the original.  TECHNIQUE: Axial CT of abdomen and pelvis with angiographic protocol after 75cc intravenous Isovue -370 contrast. Image post processing was then performed creating multi-planar 2D and angiographic 3D MIP, shaded surface rendering, or 3D volume rendering images for review and comparison.  Radiation dose reduction was utilized (automated exposure control, mA or kV adjustment based on patient size, or iterative image reconstruction).   COMPARISON:  05/21/2021 and 06/08/2022   INDICATION: Active rectal bleeding   FINDINGS:   VASCULAR:  No aortic aneurysm or dissection. Mild atherosclerosis.   LOWER CHEST: The right lower lobe 2 mm nodule series 4 image 11 is unchanged.   ABDOMEN AND PELVIS:  Liver: Within normal limits.  Gallbladder and Bile ducts: Cholecystectomy  Pancreas: Within normal limits.  Spleen: Within normal limits.  Adrenals: Within normal limits.   GI tract: Surgical changes at the GE junction. No obstruction. No active GI source identified.  Appendix: Appendectomy   Kidneys: Within normal limits.  Ureters: Within normal limits.  Urinary bladder: Within normal limits.  Reproductive: Hysterectomy   Peritoneum/Extraperitoneum: No free fluid or free air.   Lymph Nodes: No lymphadenopathy.   MUSCULOSKELETAL: In the left retroperitoneum there is a slightly definitely enlarging heavily calcified infiltrative masslike lesion, centered along the quadratus lumborum also involving the psoas and extending laterally along the diaphragmatic undersurface. The left kidney is displaced anteriorly.    IMPRESSION:   1. 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 Clinical/diagnostic follow-up is recommended.  2. No GI bleed source is identified.   Electronically Signed by: Adine Fleeta Bang on 04/26/2024 3:42 PM   Formatting of this note might be different from the original.  The INDICATION: retroperitoeal mass on CT. need PET for further eval ?hypermetabolic-malignant versus benign   TECHNIQUE: 7.1 millicuries of F-18 FDG was administered intravenously. PET imaging was obtained from the skull base to the mid thighs. CT images were obtained for attenuation correction and  localization purposes. Glucose level was 129 MG/DL. Time from injection to scan was 66 minutes. Comparison CT abdomen and pelvis 04/26/2024   FINDINGS: Reference SUV Max of normal liver parenchyma is 2.8.   Heavily calcified left retroperitoneal lesion on image 154  measures 10.1 x 33 mm. The majority of the lesion is calcifications and has no activity. There are scattered peripheral areas of soft tissue which contain hypermetabolic activity. The most prominent is posteriorly and medially seen on image 152 measuring 12 x 9 mm. SUV max is 4.2.   Scarring right apex. Emphysema. Scattered small lung nodules that are not hypermetabolic. Cholecystectomy. Hysterectomy. No adnexal mass. No concerning bone lesions.    IMPRESSION:  1. Heavily calcified lesion in the left retroperitoneum. The majority of this lesion is heavily calcified and has no activity. Peripherally there are tiny areas of hypermetabolic soft tissue. Areas of soft tissue could be neoplastic or inflammatory   Electronically Signed by: Glendia Guillaume, MD on 05/25/2024 9:02 AM   Assessment and Plan: 68 y.o. female with left lateral hip pain.  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  Additionally she has had MRIs lumbar spine in the past that showed nerve impingement and subsequently about 2 years ago did have back surgery.  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  She had a PET CT scan yesterday that did show an enhancement at the rim of the lesion with unclear cause.  She is scheduled for biopsy in the near future.  It is hard for me to recommend any specific treatment right now as she has this large dominant health uncertainty.  She will let me know when she finds out about the results of the biopsy.  Typical treatment for the lateral hip pain that she is experiencing would be steroid injection and physical therapy.  Happily will do that after the biopsy once we have a firm idea of what is going on.   PDMP not reviewed this encounter. Orders Placed This Encounter  Procedures   US  LIMITED JOINT SPACE STRUCTURES LOW LEFT(NO LINKED CHARGES)     Reason for Exam (SYMPTOM  OR DIAGNOSIS REQUIRED):   left hip pain    Preferred imaging location?:    [facility]   No orders of the defined types were placed in this encounter.    Discussed warning signs or symptoms. Please see discharge instructions. Patient expresses understanding.   The above documentation has been reviewed and is accurate and complete Artist Lloyd, M.D.

## 2024-05-27 ENCOUNTER — Ambulatory Visit: Admitting: Neuroradiology

## 2024-05-27 ENCOUNTER — Encounter: Payer: Self-pay | Admitting: Neuroradiology

## 2024-05-27 VITALS — BP 120/76 | HR 98 | Temp 97.6°F | Ht 64.0 in | Wt 139.4 lb

## 2024-05-27 DIAGNOSIS — I671 Cerebral aneurysm, nonruptured: Secondary | ICD-10-CM | POA: Diagnosis not present

## 2024-05-27 NOTE — Progress Notes (Signed)
 I had the pleasure of seeing [name] in the office today for follow-up of brain aneurysms.  Briefly, her aneurysms were initially detected in 2006.  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  She has had treatment of the aneurysms with a Neuroform stent and an Elvis stent.  No coils were placed.  This was done more than 10 years ago.  Her last CTA was 04/24/2023 and shows no growth of the aneurysms compared with the initial MR angiogram from 11/08/2004.  I have explained to [name] that the risk of bleeding of these aneurysms is very low.  I have also explained that with 18 years of stability, there is no need for further monitoring.  She has been on 325 mg of aspirin  because of the stents placed for the aneurysms.  I suggest that she reduce it to 81 mg.  This can be stopped any time as needed, and it is likely that she does not need to take it at all.  I am glad to see her again at any time as needed.  I have not scheduled any routine follow-up.

## 2024-06-15 ENCOUNTER — Encounter: Payer: Self-pay | Admitting: Family Medicine

## 2024-06-16 NOTE — Telephone Encounter (Signed)
 Report in CareEverywhere.   Procedure Note  [name] PARAS, MD - 06/11/2024 Formatting of this note might be different from the original. TECHNIQUE: CT guided left retroperitoneal mass biopsy  INDICATION: Left retroperitoneal mass  PHYSICIAN:  Lori  MEDICATIONS:  Per nursing. A total of 15 minutes moderate sedation were administered with independent continuous hemodynamic monitoring under my direct supervision.   PROCEDURE:    The risks, benefits. and alternatives to the procedure were explained to the patient, and informed written consent for the procedure was obtained. Prior to the start of the procedure, a timeout was performed in which the patient's identity, site of procedure, and type of procedure was clarified.  The patient was placed in a decubitus position and a limited CT the abdomen was performed, demonstrating the mostly calcified left retroperitoneal mass.  The overlying skin of the left flank was prepped and draped in sterile fashion, using maximal barrier precautions.  All elements of maximal sterile barrier technique, hand hygiene, skin preparation, and sterile CT/ultrasound technique were followed.    Local anesthesia was obtained with 1% lidocaine . A 17-gauge sure any was placed in the small noncalcified mildly hypermetabolic component of the mass. An 18-gauge needle was used for core biopsies, with 6 passes in total made into the mass. The pathology team reviewed the samples at the time of the biopsy, and the samples were deemed adequate for interpretation. There were no periprocedural complications.  FINDINGS:   There is a heterogeneous left retroperitoneal mass, primarily calcified. Biopsy needle was successfully placed within noncalcified component of the mass.   IMPRESSION: Technically successful left retroperitoneal mass biopsy. No immediate complications.  Electronically Signed by: [name] Sarrah, MD on 06/11/2024 11:23 AM   Narrative Performed by  [facility] Patient Name: [name], [name] [facility] [id number]   DIAGNOSIS  1. COLON, SIGMOID, POLYPECTOMY:  REACTIVE POLYPOID MUCOSA  2. RECTUM, POLYPECTOMY:  HYPERPLASTIC POLYP (CAT)    ALVERTA RONAL BIRMINGHAM MD PATHOLOGIST (CASE SIGNED 01/28/2017)  CLINICAL INFORMATION  1. ::::A SINGLE SESSILE 4 MM POLYP WAS FOUND IN THE SIGMOID COLON. A SINGLE SESSILE 8 MM POLYP WAS FOUND IN THE RECTUM.:POLYP (4 MM) IN THE SIGMOID COLON. (POLYPECTOMY) POLYP (8 MM) IN THE RECTUM. (POLYPECTOMY)  SPECIMEN 1. COLON, SIGMOID-POLYP(S) 2. RECTUM-POLYP(S)  GROSS DESCRIPTION 1. RECEIVED IN FORMALIN LABELED WITH THE PATIENT'S NAME AND DOB, AND ALSO LABELED AS SIGMOID. ARE 2 TAN SOFT TISSUE FRAGMENT MEASURING   0.5 CM, ALL IN ONE.  ES   2. RECEIVED IN FORMALIN LABELED WITH THE PATIENT'S NAME AND DOB, AND ALSO LABELED AS RECTUM. IS ONE TAN SOFT TISSUE FRAGMENT MEASURING   0.3 CM, ALL IN ONE.  ES   (CAT/ELS)  THE ABOVE DIAGNOSIS IS BASED ON PERFORMANCE OF THOROUGH GROSS AND/OR MICROSCOPIC EVALUATIONS.  IMMUNOPEROXIDASE PROCEDURES WERE DEVELOPED AND PERFORMANCE CHARACTERISTICS DETERMINED BY PATHOLOGISTS [facility].  NOT ALL HAVE BEEN CLEARED OR APPROVED BY THE [place] [employer] AND [employer]MIN[employer]RATION.   Performed by: [facility] [address] [place], [place], [place] [address] Specimen Collected: 01/27/17 Last Resulted: 01/28/17 11:33  Received From: [facility]  Result Received: 09/18/20 15:26

## 2024-07-23 ENCOUNTER — Telehealth: Payer: Self-pay

## 2024-07-23 NOTE — Telephone Encounter (Signed)
 Copied from CRM #[id number]. Topic: General - Other >> Jul 23, 2024  2:57 PM Deleta RAMAN wrote: Reason for CRM: Kim from [facility] calling to speaking with imaging requesting an image be sent over by power share. Please contact [phone]

## 2024-08-03 ENCOUNTER — Ambulatory Visit: Admitting: Physician Assistant

## 2025-03-15 ENCOUNTER — Encounter: Admitting: Internal Medicine

## 2025-03-15 ENCOUNTER — Ambulatory Visit
